# Patient Record
Sex: Female | Born: 1935 | Race: White | Hispanic: No | Smoking: Former smoker
Health system: Southern US, Community
[De-identification: ages and names within clinical notes are randomized; demographics above are authoritative.]

## PROBLEM LIST (undated history)

## (undated) DIAGNOSIS — F29 Unspecified psychosis not due to a substance or known physiological condition: Secondary | ICD-10-CM

## (undated) DIAGNOSIS — R002 Palpitations: Secondary | ICD-10-CM

## (undated) DIAGNOSIS — I341 Nonrheumatic mitral (valve) prolapse: Secondary | ICD-10-CM

## (undated) DIAGNOSIS — G5760 Lesion of plantar nerve, unspecified lower limb: Secondary | ICD-10-CM

## (undated) DIAGNOSIS — K219 Gastro-esophageal reflux disease without esophagitis: Secondary | ICD-10-CM

## (undated) DIAGNOSIS — M47817 Spondylosis without myelopathy or radiculopathy, lumbosacral region: Secondary | ICD-10-CM

## (undated) DIAGNOSIS — E785 Hyperlipidemia, unspecified: Secondary | ICD-10-CM

## (undated) DIAGNOSIS — R0989 Other specified symptoms and signs involving the circulatory and respiratory systems: Secondary | ICD-10-CM

## (undated) DIAGNOSIS — IMO0002 Reserved for concepts with insufficient information to code with codable children: Secondary | ICD-10-CM

## (undated) DIAGNOSIS — F419 Anxiety disorder, unspecified: Secondary | ICD-10-CM

## (undated) DIAGNOSIS — Z8719 Personal history of other diseases of the digestive system: Secondary | ICD-10-CM

## (undated) DIAGNOSIS — I10 Essential (primary) hypertension: Secondary | ICD-10-CM

## (undated) DIAGNOSIS — M4120 Other idiopathic scoliosis, site unspecified: Secondary | ICD-10-CM

## (undated) DIAGNOSIS — D500 Iron deficiency anemia secondary to blood loss (chronic): Secondary | ICD-10-CM

## (undated) DIAGNOSIS — M549 Dorsalgia, unspecified: Secondary | ICD-10-CM

## (undated) HISTORY — DX: Palpitations: R00.2

## (undated) HISTORY — DX: Iron deficiency anemia secondary to blood loss (chronic): D50.0

## (undated) HISTORY — DX: Other idiopathic scoliosis, site unspecified: M41.20

## (undated) HISTORY — DX: Gastro-esophageal reflux disease without esophagitis: K21.9

## (undated) HISTORY — PX: EXCISION MORTON'S NEUROMA: SHX5013

## (undated) HISTORY — DX: Dorsalgia, unspecified: M54.9

## (undated) HISTORY — DX: Hyperlipidemia, unspecified: E78.5

## (undated) HISTORY — DX: Anxiety disorder, unspecified: F41.9

## (undated) HISTORY — DX: Reserved for concepts with insufficient information to code with codable children: IMO0002

## (undated) HISTORY — DX: Lesion of plantar nerve, unspecified lower limb: G57.60

## (undated) HISTORY — DX: Other specified symptoms and signs involving the circulatory and respiratory systems: R09.89

## (undated) HISTORY — PX: LUMBAR FUSION: SHX111

## (undated) HISTORY — DX: Essential (primary) hypertension: I10

## (undated) HISTORY — DX: Nonrheumatic mitral (valve) prolapse: I34.1

## (undated) HISTORY — DX: Unspecified psychosis not due to a substance or known physiological condition: F29

## (undated) HISTORY — DX: Personal history of other diseases of the digestive system: Z87.19

## (undated) HISTORY — DX: Spondylosis without myelopathy or radiculopathy, lumbosacral region: M47.817

## (undated) HISTORY — PX: OTHER SURGICAL HISTORY: SHX169

---

## 1998-03-14 ENCOUNTER — Encounter: Admission: RE | Admit: 1998-03-14 | Discharge: 1998-06-12 | Payer: Self-pay | Admitting: Specialist

## 1998-05-12 ENCOUNTER — Ambulatory Visit (HOSPITAL_COMMUNITY): Admission: RE | Admit: 1998-05-12 | Discharge: 1998-05-12 | Payer: Self-pay | Admitting: Specialist

## 1998-10-04 ENCOUNTER — Encounter: Payer: Self-pay | Admitting: Gastroenterology

## 1998-10-04 ENCOUNTER — Ambulatory Visit (HOSPITAL_COMMUNITY): Admission: RE | Admit: 1998-10-04 | Discharge: 1998-10-04 | Payer: Self-pay | Admitting: Gastroenterology

## 1999-08-30 ENCOUNTER — Ambulatory Visit (HOSPITAL_COMMUNITY): Admission: RE | Admit: 1999-08-30 | Discharge: 1999-08-30 | Payer: Self-pay | Admitting: Internal Medicine

## 1999-08-30 ENCOUNTER — Encounter (INDEPENDENT_AMBULATORY_CARE_PROVIDER_SITE_OTHER): Payer: Self-pay | Admitting: Specialist

## 2000-02-19 ENCOUNTER — Other Ambulatory Visit: Admission: RE | Admit: 2000-02-19 | Discharge: 2000-02-19 | Payer: Self-pay | Admitting: Gynecology

## 2000-11-29 ENCOUNTER — Encounter: Payer: Self-pay | Admitting: Internal Medicine

## 2000-11-29 ENCOUNTER — Ambulatory Visit (HOSPITAL_COMMUNITY): Admission: RE | Admit: 2000-11-29 | Discharge: 2000-11-29 | Payer: Self-pay | Admitting: Internal Medicine

## 2001-09-02 HISTORY — PX: TOTAL KNEE ARTHROPLASTY: SHX125

## 2001-09-12 ENCOUNTER — Encounter: Payer: Self-pay | Admitting: Specialist

## 2001-09-18 ENCOUNTER — Inpatient Hospital Stay (HOSPITAL_COMMUNITY): Admission: RE | Admit: 2001-09-18 | Discharge: 2001-09-22 | Payer: Self-pay | Admitting: Specialist

## 2001-09-18 ENCOUNTER — Encounter: Payer: Self-pay | Admitting: Specialist

## 2001-10-23 ENCOUNTER — Encounter: Admission: RE | Admit: 2001-10-23 | Discharge: 2002-01-21 | Payer: Self-pay | Admitting: Specialist

## 2001-12-30 ENCOUNTER — Inpatient Hospital Stay (HOSPITAL_COMMUNITY): Admission: RE | Admit: 2001-12-30 | Discharge: 2002-01-01 | Payer: Self-pay | Admitting: Specialist

## 2002-01-22 ENCOUNTER — Encounter: Admission: RE | Admit: 2002-01-22 | Discharge: 2002-03-30 | Payer: Self-pay | Admitting: Specialist

## 2002-02-05 ENCOUNTER — Ambulatory Visit (HOSPITAL_COMMUNITY): Admission: RE | Admit: 2002-02-05 | Discharge: 2002-02-05 | Payer: Self-pay | Admitting: Specialist

## 2002-02-05 ENCOUNTER — Encounter: Payer: Self-pay | Admitting: Specialist

## 2002-04-07 ENCOUNTER — Inpatient Hospital Stay (HOSPITAL_COMMUNITY): Admission: RE | Admit: 2002-04-07 | Discharge: 2002-04-10 | Payer: Self-pay | Admitting: Specialist

## 2002-04-13 ENCOUNTER — Encounter: Admission: RE | Admit: 2002-04-13 | Discharge: 2002-06-25 | Payer: Self-pay | Admitting: Specialist

## 2003-01-01 ENCOUNTER — Encounter: Payer: Self-pay | Admitting: Orthopedic Surgery

## 2003-01-01 ENCOUNTER — Encounter: Admission: RE | Admit: 2003-01-01 | Discharge: 2003-01-01 | Payer: Self-pay | Admitting: Orthopedic Surgery

## 2003-02-10 ENCOUNTER — Inpatient Hospital Stay (HOSPITAL_COMMUNITY): Admission: RE | Admit: 2003-02-10 | Discharge: 2003-02-15 | Payer: Self-pay | Admitting: Orthopedic Surgery

## 2003-02-10 ENCOUNTER — Encounter: Payer: Self-pay | Admitting: Orthopedic Surgery

## 2003-05-17 ENCOUNTER — Other Ambulatory Visit: Admission: RE | Admit: 2003-05-17 | Discharge: 2003-05-17 | Payer: Self-pay | Admitting: Gynecology

## 2003-11-19 ENCOUNTER — Ambulatory Visit (HOSPITAL_COMMUNITY): Admission: RE | Admit: 2003-11-19 | Discharge: 2003-11-19 | Payer: Self-pay | Admitting: Neurosurgery

## 2004-01-04 ENCOUNTER — Ambulatory Visit (HOSPITAL_COMMUNITY): Admission: RE | Admit: 2004-01-04 | Discharge: 2004-01-04 | Payer: Self-pay | Admitting: Neurosurgery

## 2004-01-28 ENCOUNTER — Ambulatory Visit (HOSPITAL_COMMUNITY): Admission: RE | Admit: 2004-01-28 | Discharge: 2004-01-29 | Payer: Self-pay | Admitting: Neurosurgery

## 2004-09-08 ENCOUNTER — Encounter: Payer: Self-pay | Admitting: Internal Medicine

## 2004-09-08 ENCOUNTER — Encounter (INDEPENDENT_AMBULATORY_CARE_PROVIDER_SITE_OTHER): Payer: Self-pay | Admitting: *Deleted

## 2004-09-08 ENCOUNTER — Ambulatory Visit (HOSPITAL_COMMUNITY): Admission: RE | Admit: 2004-09-08 | Discharge: 2004-09-08 | Payer: Self-pay | Admitting: Internal Medicine

## 2005-05-15 ENCOUNTER — Ambulatory Visit: Payer: Self-pay | Admitting: Internal Medicine

## 2006-09-09 ENCOUNTER — Ambulatory Visit: Payer: Self-pay | Admitting: Internal Medicine

## 2007-01-03 ENCOUNTER — Encounter: Admission: RE | Admit: 2007-01-03 | Discharge: 2007-01-03 | Payer: Self-pay | Admitting: Orthopedic Surgery

## 2007-10-24 ENCOUNTER — Ambulatory Visit: Payer: Self-pay | Admitting: Internal Medicine

## 2007-11-06 ENCOUNTER — Ambulatory Visit: Payer: Self-pay | Admitting: Internal Medicine

## 2007-11-20 ENCOUNTER — Ambulatory Visit (HOSPITAL_COMMUNITY): Admission: RE | Admit: 2007-11-20 | Discharge: 2007-11-20 | Payer: Self-pay | Admitting: Neurosurgery

## 2008-03-04 ENCOUNTER — Inpatient Hospital Stay (HOSPITAL_COMMUNITY): Admission: RE | Admit: 2008-03-04 | Discharge: 2008-03-11 | Payer: Self-pay | Admitting: Neurosurgery

## 2008-03-31 ENCOUNTER — Emergency Department (HOSPITAL_COMMUNITY): Admission: EM | Admit: 2008-03-31 | Discharge: 2008-03-31 | Payer: Self-pay | Admitting: Emergency Medicine

## 2008-10-20 ENCOUNTER — Ambulatory Visit (HOSPITAL_COMMUNITY): Admission: RE | Admit: 2008-10-20 | Discharge: 2008-10-20 | Payer: Self-pay | Admitting: Orthopedic Surgery

## 2009-08-09 ENCOUNTER — Telehealth: Payer: Self-pay | Admitting: Internal Medicine

## 2010-10-12 ENCOUNTER — Telehealth: Payer: Self-pay | Admitting: Internal Medicine

## 2010-10-16 ENCOUNTER — Encounter: Payer: Self-pay | Admitting: Internal Medicine

## 2010-11-07 ENCOUNTER — Ambulatory Visit: Payer: Self-pay | Admitting: Internal Medicine

## 2010-11-07 ENCOUNTER — Encounter (INDEPENDENT_AMBULATORY_CARE_PROVIDER_SITE_OTHER): Payer: Self-pay

## 2010-11-08 ENCOUNTER — Ambulatory Visit: Payer: Self-pay | Admitting: Cardiology

## 2010-11-16 ENCOUNTER — Telehealth: Payer: Self-pay | Admitting: Internal Medicine

## 2010-11-17 ENCOUNTER — Ambulatory Visit: Payer: Self-pay | Admitting: Internal Medicine

## 2010-11-21 ENCOUNTER — Encounter: Payer: Self-pay | Admitting: Internal Medicine

## 2011-01-02 NOTE — Miscellaneous (Signed)
Summary: Lec previsit  Clinical Lists Changes  Medications: Added new medication of MOVIPREP 100 GM  SOLR (PEG-KCL-NACL-NASULF-NA ASC-C) As per prep instructions. - Signed Rx of MOVIPREP 100 GM  SOLR (PEG-KCL-NACL-NASULF-NA ASC-C) As per prep instructions.;  #1 x 0;  Signed;  Entered by: Ulis Rias RN;  Authorized by: Hart Carwin MD;  Method used: [facility] [phone]*, [address] [place], [place], [place]  [address], Ph: [phone], Fax: [phone] Allergies: Added new allergy or adverse reaction of SEPTRA Observations: Added new observation of NKA: F (11/07/2010 10:20)    Prescriptions: MOVIPREP 100 GM  SOLR (PEG-KCL-NACL-NASULF-NA ASC-C) As per prep instructions.  #1 x 0   Entered by:   Ulis Rias RN   Authorized by:   Hart Carwin MD   Signed by:   Ulis Rias RN on 11/07/2010   Method used:   Electronically to        [facility] [phone]* (retail)       [address]       [place], [place]  [address]       Ph: [phone]       Fax: [phone]   RxID:   [id number]

## 2011-01-02 NOTE — Letter (Signed)
Summary: [facility] Instructions  [facility]  [address] [place], [place] [address]   Phone: [phone]  Fax: [phone]       Mary Strickland    1930/05/01    MRN: [id number]        Procedure Day Dorna Bloom:  Farrell Ours 11/17/10     Arrival Time: 2:30pm     Procedure Time: 3:30pm     Location of Procedure:                    Juliann Pares _  [facility] (4th Floor)                       PREPARATION FOR COLONOSCOPY WITH MOVIPREP   Starting 5 days prior to your procedure  SUNDAY 12/11  do not eat nuts, seeds, popcorn, corn, beans, peas,  salads, or any raw vegetables.  Do not take any fiber supplements (e.g. Metamucil, Citrucel, and Benefiber).  THE DAY BEFORE YOUR PROCEDURE         DATE: THURSDAY  12/15  1.  Drink clear liquids the entire day-NO SOLID FOOD  2.  Do not drink anything colored red or purple.  Avoid juices with pulp.  No orange juice.  3.  Drink at least 64 oz. (8 glasses) of fluid/clear liquids during the day to prevent dehydration and help the prep work efficiently.  CLEAR LIQUIDS INCLUDE: Water Jello Ice Popsicles Tea (sugar ok, no milk/cream) Powdered fruit flavored drinks Coffee (sugar ok, no milk/cream) Gatorade Juice: apple, white grape, white cranberry  Lemonade Clear bullion, consomm, broth Carbonated beverages (any kind) Strained chicken noodle soup Hard Candy                             4.  In the morning, mix first dose of MoviPrep solution:    Empty 1 Pouch A and 1 Pouch B into the disposable container    Add lukewarm drinking water to the top line of the container. Mix to dissolve    Refrigerate (mixed solution should be used within 24 hrs)  5.  Begin drinking the prep at 5:00 p.m. The MoviPrep container is divided by 4 marks.   Every 15 minutes drink the solution down to the next mark (approximately 8 oz) until the full liter is complete.   6.  Follow completed prep with 16 oz of clear liquid of your choice (Nothing red  or purple).  Continue to drink clear liquids until bedtime.  7.  Before going to bed, mix second dose of MoviPrep solution:    Empty 1 Pouch A and 1 Pouch B into the disposable container    Add lukewarm drinking water to the top line of the container. Mix to dissolve    Refrigerate  THE DAY OF YOUR PROCEDURE      DATE: FRIDAY  12/16  Beginning at  10:30 a.m. (5 hours before procedure):         1. Every 15 minutes, drink the solution down to the next mark (approx 8 oz) until the full liter is complete.  2. Follow completed prep with 16 oz. of clear liquid of your choice.    3. You may drink clear liquids until  1:30pm  (2 HOURS BEFORE PROCEDURE).   MEDICATION INSTRUCTIONS  Unless otherwise instructed, you should take regular prescription medications with a small sip of water   as early as possible the  morning of your procedure. .  Additional medication instructions: Do not take HCTZ am of procedure         OTHER INSTRUCTIONS  You will need a responsible adult at least 75 years of age to accompany you and drive you home.   This person must remain in the waiting room during your procedure.  Wear loose fitting clothing that is easily removed.  Leave jewelry and other valuables at home.  However, you may wish to bring a book to read or  an iPod/MP3 player to listen to music as you wait for your procedure to start.  Remove all body piercing jewelry and leave at home.  Total time from sign-in until discharge is approximately 2-3 hours.  You should go home directly after your procedure and rest.  You can resume normal activities the  day after your procedure.  The day of your procedure you should not:   Drive   Make legal decisions   Operate machinery   Drink alcohol   Return to work  You will receive specific instructions about eating, activities and medications before you leave.    The above instructions have been reviewed and explained to me by   Ulis Rias RN  November 07, 2010 11:35 AM     I fully understand and can verbalize these instructions _____________________________ Date _________

## 2011-01-02 NOTE — Progress Notes (Signed)
Summary: ECL?   Phone Note Call from Patient Call back at [facility] Phone [phone]   Caller: Patient Call For: Dr. Juanda Chance Reason for Call: Talk to Nurse Summary of Call: would like to have an ECL... only rec COL Initial call taken by: Vallarie Mare,  October 12, 2010 9:52 AM  Follow-up for Phone Call        Patient states she called last year in Nov. and was told they would send her a letter to get a colonoscopy scheduled in Dec. States she did not get a letter. According to the note dated 08/09/09, the recall should be for 12/11. She is in [employer] for this date also. Patient states this is incorrect. She states she had 2 polyps removed  with her last  colon. She states her stomach is "sore." States she has BM daily with only occassional constipation. She states "When I leaned over to tie my shoe the other day I had sore water come up in my throat." She wants to schedule a colonoscopy and EGD before the new year because of insurance. Last colon was 12/08 and last EGD 09/2004.  Patient aware that it may be next week before I have answers. Please, advise.  Follow-up by: Jesse Fall RN,  October 12, 2010 11:12 AM  Additional Follow-up for Phone Call Additional follow up Details #1::        OK She may be scheduled for EGD and colon before the end of the year Additional Follow-up by: Hart Carwin MD,  October 14, 2010 3:08 PM     Appended Document: ECL? Patient scheduled for colon and EGD on 11/17/10 at 3:30 PM at [facility]. Previsit scheduled for 11/07/10 at 11:00AM. Patient will call back if she cannot find a "driver" for the procedure.

## 2011-01-02 NOTE — Letter (Signed)
Summary: Previsit letter  [facility]  [address] [place], [place] [address]   Phone: [phone]  Fax: [phone]       10/16/2010 MRN: [id number]  Mary Strickland [address] [place], [place]  [address]  Dear [name],  Welcome to the Gastroenterology Division at [facility].    You are scheduled to see a nurse for your pre-procedure visit on 11/07/10 at 11:00 AM on the 3rd floor at [facility], [address]  We ask that you try to arrive at our office 15 minutes prior to your appointment time to allow for check-in.  Your nurse visit will consist of discussing your medical and surgical history, your immediate family medical history, and your medications.    Please bring a complete list of all your medications or, if you prefer, bring the medication bottles and we will list them.  We will need to be aware of both prescribed and over the counter drugs.  We will need to know exact dosage information as well.  If you are on blood thinners (Coumadin, Plavix, Aggrenox, Ticlid, etc.) please call our office today/prior to your appointment, as we need to consult with your physician about holding your medication.   Please be prepared to read and sign documents such as consent forms, a financial agreement, and acknowledgement forms.  If necessary, and with your consent, a friend or relative is welcome to sit-in on the nurse visit with you.  Please bring your insurance card so that we may make a copy of it.  If your insurance requires a referral to see a specialist, please bring your referral form from your primary care physician.  No co-pay is required for this nurse visit.     If you cannot keep your appointment, please call [phone] to cancel or reschedule prior to your appointment date.  This allows [name] the opportunity to schedule an appointment for another patient in need of care.    Thank you for choosing [facility] for your medical needs.  We  appreciate the opportunity to care for you.  Please visit [name] at our website  to learn more about our practice.                     Sincerely.                                                                                                                   The Gastroenterology Division

## 2011-01-04 NOTE — Procedures (Signed)
Summary: Upper Endoscopy  Patient: Mary Strickland Note: All result statuses are Final unless otherwise noted.  Tests: (1) Upper Endoscopy (EGD)   EGD Upper Endoscopy       DONE      [facility]     [address]     [place], [place]  [address]           ENDOSCOPY PROCEDURE REPORT           PATIENT:  Mary Strickland, Mary Strickland  MR#:  [id number]     BIRTHDATE:  [birth year], 80 yrs. old  GENDER:  female           ENDOSCOPIST:  Hedwig Morton. Juanda Chance, MD     Referred by:  Geoffry Paradise, M.D.           PROCEDURE DATE:  11/17/2010     PROCEDURE:  EGD with biopsy, [address], EGD with dilatation over     guidewire     ASA CLASS:  Class II     INDICATIONS:  abdominal pain hx esophageal stricture 2000, 2005,     hiatal hernia           MEDICATIONS:   Versed 7 mg, Fentanyl 75 mcg     TOPICAL ANESTHETIC:  Exactacain Spray           DESCRIPTION OF PROCEDURE:   After the risks benefits and     alternatives of the procedure were thoroughly explained, informed     consent was obtained.  The LB GIF-H180 [id number] endoscope was     introduced through the mouth and advanced to the second portion of     the duodenum, without limitations.  The instrument was slowly     withdrawn as the mucosa was fully examined.     <<PROCEDUREIMAGES>>           A stricture was found (see image2 and image3). nonobstructing     fibrous ring at the entrance into a hiatal hrnia Savary dilation     over a guidewire 16 mm dilator passed over the guidewire with     minimal resistance  A hiatal hernia was found (see image6, image2,     and image1). large 6-7 cm hiatal herni completely reducible 40-46     cm  Otherwise the examination was normal. With standard forceps, a     biopsy was obtained and sent to pathology. s/p duodenal biopsies.     hx of dduodenitis r/o H (see image4 and image5).pylori     Retroflexed views revealed no abnormalities.    The scope was then     withdrawn from the patient and the procedure completed.        COMPLICATIONS:  None           ENDOSCOPIC IMPRESSION:     1) Stricture     2) Hiatal hernia     3) Otherwise normal examination     nonobstructing fibrous ring, s/p passage of Savory dilator     RECOMMENDATIONS:     1) Anti-reflux regimen to be follow     2) Await biopsy results     cont prelosec 20 mg po bid           REPEAT EXAM:  In 0 year(s) for.           ______________________________     Hedwig Morton. Juanda Chance, MD           CC:  n.     eSIGNED:   Hedwig Morton. Brodie at 11/17/2010 04:22 PM           Tamera Stands, [id number]  Note: An exclamation mark (!) indicates a result that was not dispersed into the flowsheet. Document Creation Date: 11/17/2010 4:23 PM _______________________________________________________________________  (1) Order result status: Final Collection or observation date-time: 11/17/2010 16:00 Requested date-time:  Receipt date-time:  Reported date-time:  Referring Physician:   Ordering Physician: Lina Sar [phone]) Specimen Source:  Source: Launa Grill Order Number: [id number] Lab site:

## 2011-01-04 NOTE — Letter (Signed)
Summary: Patient [facility] Biopsy Results  [facility]  [address] [place], [place] [address]   Phone: [phone]  Fax: [phone]        November 21, 2010 MRN: [id number]    Mary Strickland [address] [place], [place]  [address]    Dear Ms. Kusek,  I am pleased to inform you that the biopsies taken during your recent endoscopic examination did not show any evidence of cancer upon pathologic examination.  Additional information/recommendations:  __No further action is needed at this time.  Please follow-up with      your primary care physician for your other healthcare needs.  __ Please call [phone] to schedule a return visit to review      your condition.  _x_ Continue with the treatment plan as outlined on the day of your      exam.  _   Please call us if you are having persistent problems or have questions about your condition that have not been fully answered at this time.  Sincerely,  Hart Carwin MD  This letter has been electronically signed by your physician.  Appended Document: Patient Notice-Endo Biopsy Results letter mailed

## 2011-01-04 NOTE — Miscellaneous (Signed)
Summary: new med- Levsin  Clinical Lists Changes  Medications: Added new medication of LEVSIN 0.125 MG  TABS (HYOSCYAMINE SULFATE) place one under tongue every 6 hours as needed for abd pain - Signed Rx of LEVSIN 0.125 MG  TABS (HYOSCYAMINE SULFATE) place one under tongue every 6 hours as needed for abd pain;  #30 x 0;  Signed;  Entered by: Oda Cogan RN;  Authorized by: Hart Carwin MD;  Method used: [facility] to [facility] [phone]*, [address], [place], [place], [place]  [address], Ph: [phone], Fax: [phone]    Prescriptions: LEVSIN 0.125 MG  TABS (HYOSCYAMINE SULFATE) place one under tongue every 6 hours as needed for abd pain  #30 x 0   Entered by:   Oda Cogan RN   Authorized by:   Hart Carwin MD   Signed by:   Oda Cogan RN on 11/17/2010   Method used:   Electronically to        [facility] [phone]* (retail)       [address]       [place], [place]  [address]       Ph: [phone]       Fax: [phone]   RxID:   [id number]

## 2011-01-04 NOTE — Procedures (Signed)
Summary: Colonoscopy  Patient: Mary Strickland Note: All result statuses are Final unless otherwise noted.  Tests: (1) Colonoscopy (COL)   COL Colonoscopy           DONE (C)     [facility]     [address]     [place], [place]  [address]           COLONOSCOPY PROCEDURE REPORT           PATIENT:  Mary, Strickland  MR#:  [id number]     BIRTHDATE:  1930-10-17, 80 yrs. old  GENDER:  female     ENDOSCOPIST:  Hedwig Morton. Juanda Chance, MD     REF. BY:  Geoffry Paradise, M.D.     PROCEDURE DATE:  11/17/2010     PROCEDURE:  Colonoscopy 19[address]     ASA CLASS:  Class II     INDICATIONS:  Abdominal pain adenomatous polyp 2005     mod severe diverticulosis in 2008     MEDICATIONS:   Versed 3 mg, Fentanyl 50 mcg IV           DESCRIPTION OF PROCEDURE:   After the risks benefits and     alternatives of the procedure were thoroughly explained, informed     consent was obtained.  Digital rectal exam was performed and     revealed no rectal masses.   The LB PCF-H180AL [id number] endoscope     was introduced through the anus and advanced to the cecum, which     was identified by both the appendix and ileocecal valve, without     limitations.  The quality of the prep was excellent, using     MoviPrep.  The instrument was then slowly withdrawn as the colon     was fully examined.     <<PROCEDUREIMAGES>>           FINDINGS:  A sessile polyp was found. 5 mm flat polyp at 50 cm The     polyp was removed using cold biopsy forceps (see image6).     Moderate diverticulosis was found throughout the colon (see image5,     image4, and image1).  Otherwise normal colonoscopy without other     polyps, masses, vascular ectasias, or inflammatory changes (see     image2, image3, and image7).   Retroflexed views in the rectum     revealed no abnormalities.    The scope was then withdrawn from     the patient and the procedure completed.           COMPLICATIONS:  None     ENDOSCOPIC IMPRESSION:     1) Sessile polyp     2)  Moderate diverticulosis throughout the colon     3) Otherwise nl colonoscopy WMO     abd. pain not from diverticulosis, possibly due to an IBS     RECOMMENDATIONS:     1) Await pathology results     Benefiber 1 tsp daily     Levsin SL.125mg  #30, 1SL q6 hrs prn abd. pain     REPEAT EXAM:  In 0 year(s) for.  no recall due to age           ______________________________     Hedwig Morton. Juanda Chance, MD           CC:           n.     REVISED:  11/21/2010 09:13 AM     eSIGNED:   Verlee Monte  M. Brodie at 11/21/2010 09:13 AM           Tamera Stands, [id number]  Note: An exclamation mark (!) indicates a result that was not dispersed into the flowsheet. Document Creation Date: 11/21/2010 9:13 AM _______________________________________________________________________  (1) Order result status: Final Collection or observation date-time: 11/17/2010 16:12 Requested date-time:  Receipt date-time:  Reported date-time:  Referring Physician:   Ordering Physician: Lina Sar [phone]) Specimen Source:  Source: Launa Grill Order Number: [id number] Lab site:

## 2011-01-04 NOTE — Progress Notes (Signed)
Summary: Procedure question   Phone Note Call from Patient Call back at Home Phone [phone]   Caller: Patient Call For: dr. Juanda Chance Reason for Call: Talk to Nurse Summary of Call: Pt has questions about prep from procedure in the morning Initial call taken by: [place] Johnson,  November 16, 2010 4:50 PM  Follow-up for Phone Call        called and pt and talked her through steps of Moviprep.  pt voiced understanding.  gave her 24-7 phone number [phone]) in case of emergency or difficulty tonight. Follow-up by: Doristine Church RN II,  November 16, 2010 5:02 PM

## 2011-01-04 NOTE — Miscellaneous (Signed)
Summary: new med Prilosec  Clinical Lists Changes  Medications: Added new medication of PRILOSEC OTC 20 MG  TBEC (OMEPRAZOLE MAGNESIUM) 1 twice a day 30 minutes before meals - Signed Rx of PRILOSEC OTC 20 MG  TBEC (OMEPRAZOLE MAGNESIUM) 1 twice a day 30 minutes before meals;  #60 x 12;  Signed;  Entered by: Oda Cogan RN;  Authorized by: Hart Carwin MD;  Method used: Electronically to [facility] [phone]*, [address], [place], [place], [place]  [address], Ph: [phone], Fax: [phone]    Prescriptions: PRILOSEC OTC 20 MG  TBEC (OMEPRAZOLE MAGNESIUM) 1 twice a day 30 minutes before meals  #60 x 12   Entered by:   Oda Cogan RN   Authorized by:   Hart Carwin MD   Signed by:   Oda Cogan RN on 11/17/2010   Method used:   Electronically to        [facility] [phone]* (retail)       [address]       [place], [place]  [address]       Ph: [phone]       Fax: [phone]   RxID:   [id number]

## 2011-03-26 ENCOUNTER — Telehealth: Payer: Self-pay | Admitting: Cardiology

## 2011-03-26 NOTE — Telephone Encounter (Signed)
Called requesting samples of Bystolic 5 mg. Advised we had 10 mg and could cut them in 1/2. She will pick those up.

## 2011-03-26 NOTE — Telephone Encounter (Signed)
NEEDS SAMPLES OF BYSTOLIC 5MG . WILL BE OVER THIS WAY ON [address]ED.

## 2011-04-17 NOTE — Op Note (Signed)
[name], [name] NO.:  [id number]   MEDICAL RECORD NO.:  [id number]          PATIENT TYPE:  INP   LOCATION:  3027                         FACILITY:  [facility]   PHYSICIAN:  Danae Orleans. Venetia Maxon, M.D.  DATE OF BIRTH:  09-Feb-1930   DATE OF PROCEDURE:  03/04/2008  DATE OF DISCHARGE:                               OPERATIVE REPORT   PREOPERATIVE DIAGNOSIS:  Herniated lumbar disc with stenosis,  spondylosis, scoliosis, L3-L4, L4-L5, and L5-S1, with degenerative disc  disease and radiculopathy, also depleted spinal cord stimulator battery.   POSTOPERATIVE DIAGNOSIS:  Herniated lumbar disc with stenosis,  spondylosis, scoliosis, L3-L4, L4-L5, and L5-S1, with degenerative disc  disease and radiculopathy, also depleted spinal cord stimulator battery.   PROCEDURE:  1. Laminectomy bilaterally L3-L4, L4-L5, and L5-S1.  2. Posterior lumbar interbody fusion with PEEK interbody cages, bone      morphogenic protein with FORTRUS autograft L3-L4, L4-L5 and L5-S1      bilaterally.  3. Segmental arthrodesis L3 to S1 bilaterally.  4. Posterolateral arthrodesis L3 to S1 bilaterally.  5. Replacement of implantable pulse generator and Synergy spinal cord      stimulator battery.   SURGEON:  Danae Orleans. Venetia Maxon, M.D.   ASSISTANT:  Coletta Memos, M.D.   ANESTHESIA:  General endotracheal anesthesia.   BLOOD LOSS:  300 mL with 100 mL of Cell Saver blood returned to the  patient.   COMPLICATIONS:  None.   DISPOSITION:  Recovery.   INDICATIONS:  Mary Strickland is a 77 year old woman with multilevel  spondylosis, stenosis, and scoliosis at L3-L4, L4-L5 and L5-S1 levels.  She had previously been managed with implantable pulse generator and  spinal cord stimulator which gave her five years of relief, but has  developed progressive spondylosis and bilateral lower extremity pain,  right greater than left.  It was elected to take her to surgery for  decompression and fusion at the affected levels  and also to replace her  depleted implantable pulse generator or spinal cord stimulator at the  same time.   DESCRIPTION OF PROCEDURE:  Mary Strickland was brought to the operating room.  Following a satisfactory and uncomplicated induction of general  endotracheal anesthesia plus intravenous lines, Foley catheter, and  arterial line, she was placed in a prone position on the [place] table.  Soft tissue and bony prominences were padded appropriately.  Her low  back was prepped and draped using Betadine scrub and paint including  over her right gluteal region where her implantable pulse generator had  been placed.  Subsequently, the area in her back was infiltrated with  0.25% Marcaine and 0.5% lidocaine with 1:200,000 epinephrine.  An  incision was made overlying her implantable pulse generator and this was  replaced into the new Synergy battery.  This wound was then closed with  2-0 Vicryl sutures and 3-0 Vicryl reapproximated the skin edges.  Subsequently, this incision was dressed with Benzoin, Steri-Strips, and  an occlusive dressing at the end of the case.   Subsequently, the facet joint complexes and transverse processes of L3  through sacral alae were  then exposed.  A self-retaining retractor was  placed to facilitate exposure.  After intraoperative x-ray confirmed  correct orientation with marker probes at the L3, L4, and L5 transverse  processes and the sacral alae, the bilateral laminotomies of L3, L4 and  L5 were performed under loupe magnification. The thecal sac and L3, L4,  L5 and S1 nerve roots were decompressed bilaterally.  Initially, on the  right side, the discectomies were performed.  There was a significant  amount of collapse at the L5-S1 level and this was opened up using disc  space distractor.  Subsequently, discectomies were then performed on the  left side of midline.  The discectomy was then more thoroughly completed  and then trial spacers were placed and a more  thorough discectomy was  then completed on the right side of midline.  An 8 mm PEEK interbody  cage was selected, packed with a small piece of BMP and FORTRUS  reconstituted with autogenous blood and inserted in the interspace at  the L5-S1 level.  Subsequently, 9 mm cages similarly packed were placed  at the L4-L5 and L3-L4 levels.  Additional FORTRUS was placed along with  a piece of BMP soaked sponge deep in the interspace at each of these  levels.  Additional similarly sized cages were placed on the left side  of the midline and additional FORTRUS and autogenous bone was placed  overlying the cages after their position was confirmed on lateral  fluoroscopy. Pedicle screw fixation was then placed using 6.5 x 40 mm  sacral screws, 5.5 x 45 mm L5 screws, similarly sized L4 screws, and 5.5  x 50 mm screws at L3.  Positioning of the screws was confirmed on AP and  lateral fluoroscopy.  The wound was irrigated and then posterolateral  arthrodesis was performed over decorticated transverse processes and  posterior facet joint complexes on the left with the remaining BMP bone  autograft and FORTRUS and on the right with remaining BMP and Actifuse.  90 mm rods were placed and locked down in situ.  The self-retaining  retractor was removed.  The lumbodorsal fascia was closed with #1 Vicryl  suture, the subcutaneous tissues were reapproximated with 2-0 Vicryl  interrupted inverted sutures, and skin edges were reapproximated with  interrupted 2-0 Vicryl subcuticular stitch.  The wound was dressed with  Benzoin, Steri-Strips, Telfa gauze and tape.  The patient was extubated  in the operating room and taken to the recovery room in stable  satisfactory condition having tolerated the operation well.  Counts were  correct at the end of the case.      Danae Orleans. Venetia Maxon, M.D.  Electronically Signed     JDS/MEDQ  D:  03/04/2008  T:  03/04/2008  Job:  [id number]

## 2011-04-17 NOTE — Discharge Summary (Signed)
NAMEARIAL, GALLIGAN NO.:  [id number]   MEDICAL RECORD NO.:  [id number]          PATIENT TYPE:  INP   LOCATION:  3027                         FACILITY:  [facility]   PHYSICIAN:  Danae Orleans. Venetia Maxon, M.D.  DATE OF BIRTH:  [birth year]   DATE OF ADMISSION:  03/04/2008  DATE OF DISCHARGE:  03/11/2008                               DISCHARGE SUMMARY   REASON FOR ADMISSION:  Idiopathic scoliosis, lumbar disk displacement,  and lumbosacral spondylosis.   ADDITIONAL DIAGNOSES:  Psychosis not otherwise specified, unspecified  constipation, tobacco use disorder, esophageal reflux, and long-term use  of aspirin.   HISTORY OF ILLNESS AND HOSPITAL COURSE:  Mary Strickland is a 77 year old  woman who has previously had placement of spinal cord stimulator for  chronic back and bilateral lower extremity pain.  She was found to have  significant lumbar spondylosis with scoliosis and severe radicular pain.  She was admitted to the hospital on the same-day procedure basis for  decompressive laminectomy and interbody fusions at L3-L4, L4-L5, and L5-  S1 levels with posterolateral arthrodesis, pedicle screw fixation, and  replacement of implantable pulse generator.  Postoperatively, she did  well.  She was having some initial confusion, which gradually improved.  She has mobilized with physical therapy and was doing well on March 11, 2008, and was discharged home in stable and satisfactory condition,  having tolerated operation and hospitalization well.   DISCHARGE INSTRUCTIONS:  To follow up in the office in 3 weeks for  recheck.      Danae Orleans. Venetia Maxon, M.D.  Electronically Signed     JDS/MEDQ  D:  03/30/2008  T:  03/30/2008  Job:  [id number]

## 2011-04-20 NOTE — Op Note (Signed)
. [facility]  Patient:    Mary Strickland, Mary Strickland Visit Number: [id number] MRN: [id number]          Service Type: SUR Location: [id number] Attending Physician:  Lubertha South Dictated by:   Kerrin Champagne, M.D. Proc. Date: 04/07/02 Admit Date:  04/07/2002                             Operative Report  PREOPERATIVE DIAGNOSIS:  Arthrofibrosis, right total knee arthroplasty, with diminished right knee motion.  POSTOPERATIVE DIAGNOSIS:  Arthrofibrosis, right total knee arthroplasty, with diminished right knee motion.  PROCEDURE:  Arthroscopic debridement, right total knee arthroplasty, with closed manipulation right total knee replacement.  SURGEON:  Kerrin Champagne, M.D.  ASSISTANTJill Side P. Mahar, P.A.  ANESTHESIA:  Epidural anesthesia, Burna Forts, M.D.  TOURNIQUET TIME:  Total tourniquet time at 300 mmHg 58 minutes.  ESTIMATED BLOOD LOSS:  2 cc.  DRAINS:  None.  BRIEF CLINICAL HISTORY:  This patient is a 77 year old female who underwent a total knee arthroplasty on the right side in October of last year.  She has since developed arthrofibrosis, underwent closed manipulation in January with apparent improvement of range of motion, only to have range of motion diminish over the ensuing four to six weeks postop.  Range of motion is approximately 0-90 degrees.  Todays evaluation preoperatively indicates a range of motion of 0 to only 60 degrees without anesthesia.  While under anesthetic, the patient, however, developed increased range of motion with the ability to fully extend the knee and flex the knee to almost 90 degrees under epidural anesthesia.  INTRAOPERATIVE FINDINGS:  The patient with an epidural catheter in place demonstrated improved range of motion from 0-90 degrees whereas preoperatively showed only 0-60 degrees, suggesting a component of volitional diminished range of motion.  Range of motion, however, did  significantly improve following arthroscopic debridement of the joint and manipulation.  The range of motion was 0-130 degrees possible.  This following removal of the patients tourniquet and after closure of wounds.  DESCRIPTION OF PROCEDURE:  After adequate epidural anesthesia, the patient i supine position, the right lower extremity prepped from the ankle to the upper thigh with Duraprep solution and a tourniquet about the right upper thigh.  A right knee holder was used.  The left leg well-padded with Ace wrap about the left leg to prevent DVT.  Following standard prep, draped in the usual manner.  The right knee had insertion of the inflow cannula in line with previous medial parapatellar incision scar approximately 2 cm above the superior pole of the patella, a stab wound made with an 11 blade scalpel, and then the inflow cannula, a small cannula, was inserted into the suprapatellar pouch.  The knee inflated with irrigant solution and stab incision made in the anterior inferior lateral portal region for insertion of the scope.  An additional incision was then made in line with the medial parapatellar incision just inferior to the inferior pole of the patella to allow for entry in the joint and use of the 4.2 Cuda side shaver.  The scope inserted, then the suprapatellar pouch examined, as was the medial recess.  Medial recess was found to have scar extending over the medial aspect of the tibial insert.  This was debrided using the 4.2 Cuda shaver.  Scar tissue was also found to be present within the intercondylar notch, adhesing the ligamentum  mucosa type effect from the patellar tendon into the patients notch, and this was debrided using the shaver as well.  The lateral anterior aspect of the joint was then debrided as well, as was the lateral recess. There was some evidence of a lateral shelf plica, which was debrided, and then the suprapatellar pouch was debrided of scar tissue  attached to the anterior aspect of the distal femur in hopes of regaining further motion here. Following this debridement, then, irrigant solution was removed.  The knee was then placed through a range of motion of 0-120 degrees initially.  The inflow cannula portal was closed with 4-0 Vicryl suture subcuticular, as was the inferomedial and inferolateral portals closed in similar fashion, then a vertical mattress suture of 4-0 nylon applied to each of the incisions. Dressings:  Adaptic, 4 x 4s, ABD pad affixed to the skin with sterile Webril. The patient then had removal of the drapes.  The tourniquet was released and removed.  With the tourniquet off, then the knee was able to be flexed to nearly 130 degrees.  Extension was full and complete.  With this, then, an Ace wrap was applied.  The patient was then returned to the recovery room in satisfactory condition.  All instrument and sponge counts were correct.  Photomicrographs were obtained demonstrating arthrofibrotic scar within the medial recess, some evidence also suggests scar tissue within the intercondylar notch seen on the VCR error photomicrograph. Dictated by:   Kerrin Champagne, M.D. Attending Physician:  Lubertha South DD:  04/07/02 TD:  04/08/02 Job: [id number]

## 2011-04-20 NOTE — H&P (Signed)
[place]  Patient:    Mary Strickland, Mary Strickland Visit Number: [id number] MRN: [id number]          Service Type: SUR Location: 4W [id number] [id number] Attending Physician:  Lubertha South Dictated by:   Ottie Glazier Wynona Neat, P.A.-C. Admit Date:  09/18/2001 Discharge Date: 09/22/2001                           History and Physical  DATE OF HISTORY AND PHYSICAL:  December 26, 2001.  CHIEF COMPLAINT:  Right knee pain.  HISTORY OF PRESENT ILLNESS:  Ms. Howells is a 77 year old white female who is status post a right total knee arthroplasty on September 18, 2001.  The patient had an initially benign postoperative course in which she underwent physical therapy; however, she persisted to have worsening range of motion and loss of approximately 14 degrees of flexion.  The patient also noticed increasing discomfort in her knee, primarily over the anterior aspect of the knee.  The patient was seen and evaluated by Dr. Otelia Sergeant and was found to have arthrofibrosis.  Due to these findings, the patient has opted to proceed with manipulation of the right knee under general anesthesia.  This has been discussed in great detail with the patient per Dr. Vira Browns.  ALLERGIES:  SEPTRA DS, TYLENOL NO. 3.  MEDICATIONS:  Lorazepam 1-1/2 p.o. q.h.s., Prilosec one p.o. q.h.s., calcium supplements two a day, vitamin E one a day, Robaxin 500 mg one p.o. q.8h. p.r.n., Darvocet-N one to two tablets every four to eight hours p.r.n. pain.  PAST MEDICAL HISTORY:  She has a history of mitral valve prolapse with a history of "palpitations," also a history of anxiety and GERD.  PAST SURGICAL HISTORY:  Significant for hysterectomy, Mortons neuroma removal, right total knee arthroplasty in 2002 as stated above.  SOCIAL HISTORY:  She is married with one child.  She denies any tobacco or alcohol use.  She lives in a single-level home.  Her primary care physician is Dr. Jacky Kindle.  FAMILY HISTORY:  Her  mother and father both had a history of coronary artery disease, and her father subsequently had a CVA.  She does have a brother with lung cancer.  REVIEW OF SYSTEMS:  The patient states that she occasionally has chills and night sweats; however, this is due to "menopause."  Denies any weight loss or decrease in appetite.  HEENT:  She does wear corrective lenses.  Denies any history of chronic headache, seeing spots or sticks, ringing of the ears, or persistent runny nose or sore throat.  There are no thyroid problems.  CHEST: She denies any chronic cough, productive cough, hemoptysis.  CARDIOVASCULAR: History of mitral valve prolapse and a history of palpitations, questionably related to anxiety attacks.  GASTROINTESTINAL/GENITOURINARY:  Denies any history of chronic diarrhea, constipation, melena, bright red stools per rectum, dysuria, frequency, urgency, or nocturia.  EXTREMITIES:  Please see HPI.  NEUROLOGIC:  Denies any history of seizure or stroke.  PHYSICAL EXAMINATION:  VITAL SIGNS:  Blood pressure is 140/80, pulse of 90, respirations 16.  GENERAL:  This is a very pleasant 77 year old white female appearing her stated age, very talkative.  HEENT:  Head is atraumatic, normocephalic.  NECK:  Supple without masses or carotid bruits.  CHEST:  Clear to auscultation bilaterally.  BREASTS:  Deferred, not pertinent to present illness.  CARDIAC:  Regular rate and rhythm, S1, S2.  ABDOMEN:  Soft, nontender, bowel sounds are  positive.  There is no guarding, no rebound.  GENITOURINARY:  Deferred, not pertinent to present illness.  EXTREMITIES:  Physical examination shows that the right knee is approximately 10 degrees short of full extension with active extension.  Passively the patient can nearly fully extend the right knee.  Passive range of motion is generally approximately 90-95 degrees.  DIAGNOSTIC STUDIES:  Radiographs reveal a right knee replacement with good position  and alignment.  No varus or valgus angle to the tibial component or femoral component.  Sunrise patellar view demonstrates articulation of the patellofemoral joint in good position and alignment.  No lateral augment of the patella noted.  The patients lateral radiograph demonstrate some slight notching of the distal anterior and femoral cortex associated with relative hypoplastic right lateral femoral condyle, resulting in notching with the coronal _____.  IMPRESSION: 1. Right knee arthrofibrosis. 2. History of mitral valve prolapse. 3. History of palpitations. 4. History of anxiety. 5. History of gastroesophageal reflux disease.  PLAN:  The patient will be admitted to [facility] to undergo manipulation of the right knee under general anesthesia, at which time she will have an intraoperative injection of Depo-Medrol.  The risks and benefits of this procedure have been discussed with the patient, of which she states good understanding prior to entering the operating suite.  Considering her history of mitral valve prolapse and a history of palpitations, irregardless of the etiology the patient at this point will need prophylactic antibiotics.  The patient has also been instructed to have medical clearance prior to the procedure. Dictated by:   Ottie Glazier. Wynona Neat, P.A.-C. Attending Physician:  Lubertha South DD:  12/28/01 TD:  12/29/01 Job: [id number]

## 2011-04-20 NOTE — Discharge Summary (Signed)
Mary Strickland                            ACCOUNT NO.:  [id number]   MEDICAL RECORD NO.:  [id number]                   PATIENT TYPE:  INP   LOCATION:  0476                                 FACILITY:  [facility]   PHYSICIAN:  Ollen Gross, M.D.                 DATE OF BIRTH:  [birth year]   DATE OF ADMISSION:  02/10/2003  DATE OF DISCHARGE:  02/15/2003                                 DISCHARGE SUMMARY   ADMISSION DIAGNOSES:  1. Painful right total knee.  2. Mitral valve prolapse.  3. History of palpitations.  4. Anxiety.  5. Gastroesophageal reflux disease.   DISCHARGE DIAGNOSES:  1. Painful right total knee, status post revision right total knee     replacement arthroplasty.  2. Mild postoperative blood loss anemia.  3. Mild postoperative hyponatremia, improved.  4. Mitral valve prolapse.  5. History of palpitations.  6. Anxiety.  7. Gastroesophageal reflux disease.   PROCEDURE:  The patient was taken to the OR on 02/10/03, underwent a  revision of a right total knee arthroplasty.  Surgeon:  Ollen Gross, MD.  Assistant:  Alexzandrew L. Perkins, P.A.-C.  Surgery under general  anesthesia.  Minimal blood loss.  Hemovac drain x 1.  Tourniquet time 62  minutes at 300 mmHg and then down for 10 minutes and up an additional 23  minutes at 300 mmHg.   CONSULTS:  Rehabilitation Services, Ellwood Dense, M.D.   BRIEF HISTORY:  The patient is a 77 year old female, who was referred over  to Ollen Gross, M.D. for a second opinion concerning a painful right total  knee.  It was done approximately one year ago and which has been complicated  by pain and stiffness.  She has undergone closed manipulation and also had  arthroscopic lysis of adhesions.  All of these failed.  She was subsequently  seen by Ollen Gross, M.D. for a second opinion and management.  It was  noted that she had significant notching on the anterior femoral cortex.  Bone scan showed increased activity also in  that area.  She had painful  motion.  It was felt that she possibly had a loosening of the prosthesis.  It was also noted in the past that she had had a fall postoperative  following her total knee and felt this may have aggravated her underlying  condition.  She was brought into the hospital for surgery.   LABORATORY DATA:  CBC on admission:  Hemoglobin 14.1, hematocrit 41.0, white  cell count 7.6, red cell count 4.58.  Postop H&H 10.8 and 31.2.  Last noted  H&H 10.3 and 30.3.  Differential on admission:  CBC all within normal  limits.  PT/PTT on admission were 12.5 and 33, respectively with an INR of  0.9.  Serial pro times followed per Coumadin protocol.  Last noted PT/INR  was 20.2 and 1.9.  Chem panel on admission all  within normal limits.  Follow-  up BMET showed a drop in sodium from 141 to 134, back up to 137.  Glucose  went up from 81 to 178 and was back down to 130.  Calcium dropped from 9.8  to 8.3, was back up to 8.4.  Urinalysis on admission only showed some small  leukocyte esterase and only rare epithelial cells and 0-2 white cells, 0-2  red cells with few hyaline cast.  Blood group type A positive.   Smears taken at the time of surgery:  Initial Gram stain, tissue from the  right knee showed few WBC's, no organisms seen.  Second synovial Gram stain  from the right knee showed rare WBC's and no organisms seen.  Tissue  cultures taken on 02/10/03 from the right knee:  No WBC's, no organisms seen  on the Gram stain.  No growth in the culture.  Wound culture taken from the  synovial tissue of the right knee showed Gram stain:  No WBC's, no organisms  seen, no growth on the culture.  Anaerobic culture taken from the knee on  its Gram stain:  No WBC's, no organisms seen, and no anaerobes were isolated  during the hospital course.   X-RAYS:  X-rays of the right knee on 02/10/03, showed good position and  alignment following right total knee revision.  The chest x-ray report  from  [facility] dated 09/01/02 showed slight  cardiomegaly, no active disease.   EKG dated 02/03/03:  Normal sinus rhythm.  Nonspecific ST and T-wave  abnormalities.  No significant change since last tracing, confirmed by  Nanetta Batty, M.D.  There is also a previous EKG on the chart dated  09/12/01, which shows normal sinus rhythm, normal EKG, no old tracings to  compare, confirmed by Peter M. [place], M.D.   HOSPITAL COURSE:  The patient was admitted to [facility], taken to  the OR, underwent the above-stated procedure without complications.  The  patient tolerated the procedure well and was later transferred to the  recovery room and then to the orthopedic floor for continued postop care.  The patient was placed on PCA analgesics for pain control following surgery.  Started back on her home medications.  Was given 48 hours of postop IV  antibiotics, placed weightbearing as tolerated.  PT and OT were consulted to  assist with gait training ambulation and ADLs following surgery.  Serial pro  times were followed per Coumadin protocol, may be found in the laboratory  data section of this chart.  Hemovac drain, which was placed at the time of  surgery, was pulled on postop day one.  Dressing change was initiated on  postop day two.  The incision was healing well.  Staples were intact.  No  signs of infection.  She did undergo physical therapy during the hospital  course.  It was noted by postop day two, the patient was already ambulating  approximately 60 feet and continued to improve.  She was utilizing the CPM  for range of motion.  It was encouraged for her to do range of motion in the  CPM and also active assisted range of motion with the therapist and also  active motion by herself to obtain maximum results from the revision  surgery.  Initially, it was felt the patient may need a short course on rehab; therefore, rehab consult was called early  in the hospital course.  She was seen by rehab and felt she  may be a possible candidate for a short  stay on rehab.  It was noted during the second half of the course that the  patient was progressing well, on top of which after talking with rehab,  there were no rehab or SACU beds available for several days.  Due to this  information, arrangements were made for the patient to go home on Monday.  Home health PTx and home health nursing was arranged.  By Monday, 02/15/03,  the patient was doing quite well.  She was making progress with physical  therapy.  She was up ambulating more independent.  She had previously  arranged equipment at home.  All we had to do was arrange for a CPM.  She  was doing well, seen on rounds, and was discharged home.   DISCHARGE MEDICATIONS AND PLAN:  1. The patient discharged home on 02/15/03.  2. Discharge diagnoses, please see above.August 28, 2002.   DISCHARGE MEDICATIONS:  1. Vicodin 5 mg p.r.n. pain.  2. Coumadin as per pharmacy protocol.  3. Robaxin 500 mg p.r.n. spasm.   DIET:  Resume previous home diet.   ACTIVITY:  1. She is weightbearing as tolerated to the right lower extremity.  2. Genevieve Norlander for home care, home health PT, home health nursing.  3. She will arrange to have home health PT every day at home during the     first week after discharge and then the second week, she will have it     Monday, Wednesday, Friday.  4. She will be up as tolerated.   FOLLOW UP:  She is going to follow up in the office next week.  She is to  call the office for an appointment at [phone].   DISPOSITION:  Home.   CONDITION ON DISCHARGE:  Improved.     Alexzandrew L. Julien Girt, P.A.              Ollen Gross, M.D.    ALP/MEDQ  D:  02/15/2003  T:  02/15/2003  Job:  [id number]

## 2011-04-20 NOTE — Op Note (Signed)
[name], [name]                            ACCOUNT NO.:  [id number]   MEDICAL RECORD NO.:  [id number]                   PATIENT TYPE:  OIB   LOCATION:  3038                                 FACILITY:  [facility]   PHYSICIAN:  Danae Orleans. Venetia Maxon, M.D.               DATE OF BIRTH:  Oct 12, 1930   DATE OF PROCEDURE:  01/28/2004  DATE OF DISCHARGE:                                 OPERATIVE REPORT   PREOPERATIVE DIAGNOSES:  Chronic intractable back pain with sciatica status  post successful spinal cord stimulator trial.   POSTOPERATIVE DIAGNOSES:  Chronic intractable back pain with sciatica status  post successful spinal cord stimulator trial.   PROCEDURE:  Placement of permanent spinal cord stimulator by thoracic  laminectomy with implantable pulse generator.   SURGEON:  Danae Orleans. Venetia Maxon, M.D.   ANESTHESIA:  General endotracheal anesthesia.   ESTIMATED BLOOD LOSS:  Minimal.   COMPLICATIONS:  None.   DISPOSITION:  To recovery.   INDICATIONS FOR PROCEDURE:  Mary Strickland is a 77 year old woman with chronic  intractable low back and bilateral lower extremity pain with lumbar  degenerative spondylosis without surgically correctable lesion. It was  elected for her to undergo a percutaneous spinal cord stimulator trial which  she did and which gave her considerable relief of her pain.  It was  therefore elected to take her to surgery for a surgically implanted spinal  cord stimulator.   DESCRIPTION OF PROCEDURE:  [name] was brought to the operating room.  Following the satisfactory and uncomplicated induction of general  endotracheal anesthesia and placement of intravenous lines, the patient was  positioned in the prone position on the operating table and chest rolls.  Her back preoperative fluoroscopic image was used to localize the T11 level  and then her back was prepped and draped in the usual sterile fashion. The  areas of plane incision were infiltrated with 0.25% Marcaine and 0.5%  lidocaine 1:200,000 epinephrine.  An incision was made in the midline of her  thoracic spine at the level of T11 and subperiosteal dissection was  performed on the right side. The T11 lamina was identified on intraoperative  fluoroscopy and hemisemilaminectomy of T11 was then performed with exposure  of the spinal cord dura. The paddle __________ contact [employer] spinal cord  stimulator electrode was then placed in the epidural space centered on T9-10  in the midline. The fascia was then closed with #0 Vicryl sutures and  anchors were placed with 2-0 silk stitches.  A subcutaneous pocket was  created over the right buttock and the tunneling device was used to pass the  lead extensions.  The leads were then connected in the usual fashion and  redundant lead was circularized in the wound and the wounds were closed with  2-0 Vicryl sutures and I  reapproximated the subcutaneous tissues and 3-0 Vicryl interrupted stitches  reapproximating the skin edges. The wounds  were dressed with Benzoin and  Steri-Strips, Telfa gauze and tape. A final x-ray was taken prior to closing  the wound which demonstrated that the electrodes had not moved and were in  good position.                                               Danae Orleans. Venetia Maxon, M.D.    JDS/MEDQ  D:  01/28/2004  T:  01/28/2004  Job:  [id number]

## 2011-04-20 NOTE — H&P (Signed)
[facility]  Patient:    Mary Strickland, Mary Strickland Visit Number: [id number] MRN: [id number]          Service Type: Attending:  Kerrin Champagne, M.D. Dictated by:   Irena Cords, P.A.-C Adm. Date:  09/18/01   CC:         Richard A. Jacky Kindle, M.D.   History and Physical  DATE OF BIRTH:  October 14, 1930  SOCIAL:  [id number]  CHIEF COMPLAINT:  Right knee pain.  HISTORY OF PRESENT ILLNESS:  Mary Strickland is a 77 year old female who presents with worsening right knee pain.  This has been persistent, worsening over the last year or two.  She underwent a knee arthroscopy without much relief.  She has also tried multiple cortisone injections as well as Hyalgan series of injections without improvement.  Pain is now daily and interfering with her activities of daily living.  Because of the persistence of her pain, worsening symptoms and the findings on her clinical and radiographic exams, she has elected to undergo surgical intervention.  REVIEW OF SYSTEMS:  She denies any recent fever or chills.  No diplopia, headache or blurred vision.  No rhinorrhea, sore throat, or earache.  No chest pain, shortness of breath or cough.  Does report a stomach ache last week that has since resolved.  Otherwise, no nausea, vomiting, diarrhea or constipation. No melena or bright red blood per rectum.  No urinary frequency, dysuria or hematuria.  No numbness or tingling in her extremities.  PAST MEDICAL HISTORY:  Past medical history is significant for peptic ulcer disease, gastroesophageal reflux disease, thyroid disorder, mitral valve prolapse and occasional heart palpitations.  Denies coronary artery disease, heart attack, stroke, seizure, cancer, diabetes, hypertension or lung or kidney disease.  ALLERGIES:  SEPTRA/SULFA causing throat swelling and PENICILLIN caused her "feel bad."  CURRENT MEDICATIONS: 1. Lorazepam 1.5 mg q.h.s. 2. Prilosec 20 mg q.d. 3. Premarin 0.625 mg  q.d. 4. Vitamin E 1000 IU q.d. 5. Calcium 600 mg b.i.d. 6. Bextra, which she will stop a week before surgery.  FAMILY HISTORY:  Family history is significant for a stroke in her sister, lung cancer in her brother and heart disease in another brother.  She is unsure of her parents medical history.  PAST SURGICAL HISTORY:  Mortons neuroma, right knee arthroscopy, cataract surgery, hysterectomy and lymph node excision on the left arm.  SOCIAL HISTORY:  She is married.  She has one daughter who will be coming down to assist with her after surgery.  Patient denies remote history of tobacco use.  She has one step leading into her home and lives in a one-floor dwelling.  She is retired.  Dr. Jesse Sans. Wall is her cardiologist. Dr. Pearletha Furl. Jacky Kindle is her medical physician.  PHYSICAL EXAMINATION:  VITAL SIGNS:  Pulse 76, respiratory rate 10, blood pressure 120/72.  GENERAL:  A well-developed, well-nourished female in no acute distress.  HEENT:  Head is atraumatic, normocephalic.  Pupils are equal, round and reactive to light and extraocular movements are grossly intact.  Oropharynx is clear without redness, exudate or lesions.  NECK:  Supple with no cervical lymphadenopathy and no carotid bruits.  No thyromegaly.  She does have a small soft tissue mass, suprasternal region, which has been unchanged over years.  CHEST:  Clear to auscultation bilaterally with no wheezes or crackles.  HEART:  Regular rate and rhythm with no murmurs, rubs, or gallops.  ABDOMEN:  Soft, nontender, nondistended, with no masses, no hepatosplenomegaly.  BREASTS:  Not examined as not pertinent to present illness.  GENITOURINARY:  Not examined as not pertinent to present illness.  SKIN:  Intact.  EXTREMITIES:  DP pulse 2+.  Motor and sensory are grossly intact.  Right knee demonstrates a varus deformity.  Tender medial joint line as well as the lateral patellofemoral joint.  Crepitus throughout range of  motion, range of motion is 0 to 125 degrees.  X-RAY FINDINGS:  X-rays demonstrate significant joint line sclerosis and narrowing of the medial joint line on the right knee.  IMPRESSION: 1. Right knee osteoarthritis. 2. Peptic ulcer disease. 3. Gastroesophageal reflux. 4. Thyroid disorder. 5. Mitral valve prolapse. 6. Occasional heart palpitations.  PLAN:  Patient will be admitted to [facility] to undergo a right cemented total knee arthroplasty by Dr. Kerrin Champagne on September 18, 2001. Preoperative labs and signed surgical consent will be obtained.  Patient has donated two units of autologous blood.  All questions have been encouraged and answered. Dictated by:   Irena Cords, P.A.-C Attending:  Kerrin Champagne, M.D. DD:  09/03/01 TD:  09/03/01 Job: [id number]

## 2011-04-20 NOTE — Op Note (Signed)
[facility]  Patient:    ELLENI, MOZINGO Visit Number: [id number] MRN: [id number]          Service Type: SUR Location: [facility] 2550 13 Attending Physician:  Lubertha South Dictated by:   Kerrin Champagne, M.D. Proc. Date: 12/30/01 Admit Date:  12/30/2001                             Operative Report  PREOPERATIVE DIAGNOSIS: Arthrofibrosis of right total knee arthroplasty.  POSTOPERATIVE DIAGNOSIS: Arthrofibrosis of right total knee arthroplasty.  OPERATION/PROCEDURE: Manipulation of right total knee arthroplasty with intra-articular steroid injection and epidural analgesia.  SURGEON: Kerrin Champagne, M.D.  ANESTHESIA: Epidural supplemented with spinal anesthesia, Dr. Jacklynn Bue.  ESTIMATED BLOOD LOSS: Two cc.  DRAINS: None.  INDICATIONS FOR PROCEDURE: The patient is a 77 year old female, who has undergone previous total knee arthroplasty almost four months ago.  She initially had excellent range of motion and over the last two months has had worsening flexion of the right lower extremity, increasing pain and discomfort.  The patient clinically has range of motion of 0-75 degrees.  This is much more limited than at immediate postoperative and following her treatment in physical therapy.  The patient is brought to the operating room to undergo closed manipulation of the right total knee arthroplasty and injection of steroid medication, and implementation of epidural analgesia for immediate postoperative CVM and physical therapy.  DESCRIPTION OF PROCEDURE: After adequate epidural anesthesia and spinal anesthetic the patient was turned to the supine position and the right knee placed into flexion at the hip, and then while holding the proximal portion of the tibia the knee was gently flexed.  Palpating the superior aspect of the patella in the region of the quadriceps tendon and the suprapatellar pouch it was noted that the patient had distinct  giving way sensation in this area consistent with tearing of scar tissue as the knee acquired more flexion. Able to flex the knee from 75 degrees down to 120 degrees following manipulation.  The knee could be brought into full extension with pressure over the anterior aspect of the knee.  The patient then had injection of the knee through an anterolateral puncture.  This was done after sterile prep with DuraPrep solution.  Using sterile gloves a solution of 1 cc of Depo Medrol 20 mg to a cc, 15 cc xylocaine 1% with epinephrine and 15 cc of Marcaine 0.50% plain were injected or instilled into the right knee through injection sterilely.  The knee then again was put throughout range of motion.  A small dressing applied where the injection was performed in the right lateral knee.  The patient then returned to the recovery room in satisfactory condition.  All instrument and sponge counts were correct. Dictated by:   Kerrin Champagne, M.D. Attending Physician:  Lubertha South DD:  12/30/01 TD:  12/31/01 Job: [id number]

## 2011-04-20 NOTE — Assessment & Plan Note (Signed)
[facility]                           GASTROENTEROLOGY OFFICE NOTE   ADORE, KITHCART                         MRN:          [id number]  DATE:09/09/2006                            DOB:          [birth year]    Ms. Friscia is a very nice 78 year old white female who has history of  symptomatic diverticulosis.  She also has history of severe gastroesophageal  reflux and esophageal stricture, which has been dilated multiple times  starting in 1981 by Dr. Victorino Dike.  She has had numerous colonoscopies,  the last one in October 2005, confirming presence of marked diverticulosis  of the left colon.  She had also tubular adenomas and is scheduled for  repeat colonoscopy in October 2008.  Three weeks ago, the patient developed  a rather marked left upper quadrant abdominal pain around the splenic  flexure radiating slightly medially.  It has now subsided about 80% and  residual discomfort is now in the right lower quadrant.  Her bowel habits  remained normal throughout this episode.  There was no fever, no vomiting.  She does take oxycodone regularly for control of lower back pain.  She also  takes Prilosec for gastroesophageal reflux.   MEDICATIONS:  1. Prilosec 20 mg p.o. b.i.d.  2. Potassium supplements.  3. Zocor 20 mg p.o. daily.  4. Temazepam 1 nightly.  5. Aspirin 325 mg p.o. daily.  6. Calcium supplements.   PHYSICAL EXAM:  Blood pressure 126/64, pulse 78, weight 148 pounds.  She was alert,oriented, in no distress.  LUNGS:  Clear to auscultation.  COR:  Normal S1 and normal S2.  NECK:  Supple.  No adenopathy.  ABDOMEN:  Soft with normoactive bowel sounds.  Minimal tenderness left upper  quadrant.  Liver edge was auscultatable at the costal margin.  No CVA  tenderness bilaterally.  Lower abdomen was normal.  RECTAL:  Normal rectal tone.  The stool was brown, Hemoccult negative.   IMPRESSION:  70. A 77 year old white female who has a history of  gastroesophageal      reflux, peptic ulcer disease, and stricture, which currently is well-      controlled on proton pump inhibitors.  2. New-onset abdominal pain, most likely related to symptomatic      diverticulosis.  The patient had 2 courses of antibiotics.  On CT scan,      no evidence of inflammation was noted, so the patient has not continued      on antibiotics after she finished the first course.   PLAN:  1. Since the pain has resolved about 75%, no other medical treatment will      be necessary.  2. She is due for colonoscopy October of 2008.  3. Continue Prilosec 20 mg b.i.d.  4. The patient will notify us if the pain starts again.  She may need      another course of antibiotics and a repeat CT scan.       Hedwig Morton. Juanda Chance, MD      DMB/MedQ  DD:  09/09/2006  DT:  09/10/2006  Job #:  [id number]   cc:   Geoffry Paradise, M.D.

## 2011-04-20 NOTE — Discharge Summary (Signed)
[place]. [facility]  Patient:    Mary Strickland, Mary Strickland Visit Number: [id number] MRN: [id number]          Service Type: SUR Location: [id number] Attending Physician:  Lubertha South Dictated by:   Sammuel Cooper Mahar, P.A. Admit Date:  04/07/2002 Discharge Date: 04/10/2002                             Discharge Summary  DATE OF BIRTH:  [birth year].  DISPOSITION:  Patient discharged home.  ADMISSION DIAGNOSES: 1. Status post right total knee with arthrofibrosis, status post closed    manipulation in January of 2003 of right total knee. 2. History of mitral valve prolapse with history of palpitations. 3. Anxiety. 4. Gastroesophageal reflux disease.  DISCHARGE DIAGNOSES: 1. Status post arthroscopic debridement of right total knee replacement    with manipulation of right total knee replacement. 2. History of mitral valve prolapse with history of palpitations. 3. Anxiety. 4. Gastroesophageal reflux disease.  PROCEDURE:  Right knee arthroscopic debridement of total knee replacement with manipulation of right total knee replacement.  SURGEON:  Kerrin Champagne, M.D.  ASSISTANT:  Jill Side Mahar, PA-C.  ANESTHESIA:  Epidural.  CONSULTATION:  Rehabilitation consult, Dr. Thomasena Edis.  BRIEF HISTORY OF PRESENT ILLNESS:  The patient is a 77 year old white female status post right total knee replacement in October, 2002. Following that the patient had limited motion.  She had a closed manipulation done in January of 2003, however, she was having continued problems with range of motion despite the closed manipulation and progressive physical therapy.  It has gotten to the point that it is near constant aggravation as well as interfering with her activities of daily living.  It was felt the best course of management for her at this point would be arthroscopic debridement and possible open debridement of her right total knee replacement. The risks and benefits of  the surgery were discussed with the patient by Dr. Vira Browns.  The patient indicated understanding and opted to proceed.  LABORATORY DATA:  Preadmission laboratory on Apr 06, 2002 with CBC within normal limits.  Protime and INR, PTT within normal limits.  Comprehensive metabolic panel within normal limits. Urinalysis was negative.  On Apr 08, 2002 the hemoglobin and hematocrit were within normal limits.  Basic metabolic panel within normal limits with the exception of a glucose of 147.  X-rays from September 12, 2001 are on the chart and show right knee osteoarthritis medial compartment.  X-rays of chest show no active disease, again from September 12, 2001. No other x-ray reports are on the chart.  No electrocardiogram report is on the chart.  HOSPITAL COURSE:  Following admission on Apr 07, 2002 the patient was taken to the operating room for the above-listed procedure.  She tolerated the procedure well without any intraoperative complications.  Surgical time was 58 minutes with estimated blood loss being minimal.  The patient was transferred to the recovery room in stable condition.  The patient was continued on intravenous antibiotics for 24 hours postoperatively and completed this course without any difficulty.  Her diet was advanced as tolerated also without any difficulty.  Neurovascular checks were started immediately postoperatively and were intact without any problems.  These were continued daily throughout her hospital stay and remained intact.  The patient was started on a CPM machine immediately postoperatively in PACU with maximum tolerated range there from 0 to 110 degrees maximum  range of motion allowed with the machine for 8 to 10 hours per day.  She tolerated this without any significant difficulties. Physical therapy and occupational therapy were also consulted to begin aggressive range of motion beginning on the operative day.  She was continued on bed rest for two days  secondary to epidural in place and as we did not want to risk any falls or complications.  Pain control was attained utilizing the epidural.  Upon this being discontinued on postoperative day #2 she was transitioned over to p.o. analgesics for pain and pain control remained adequate throughout her stay.  Operative dressing was taken down on postoperative day #2.  Incision looked good without any signs or symptoms of infections.  Portals were without any drainage and seen to be healing well. Sutures were intact.  Daily dressing changes were done thereafter and continued to look good without any signs or symptoms of infection. Rehabilitation consultation was ordered with Dr. Thomasena Edis at the patients request.  It was felt that she would benefit from this if she was an appropriate candidate, however, it was expected that she would do too well to be appropriate for rehabilitation therapy.  The rehabilitation doctors did discuss this with her and she was adamant that she wanted to go to rehabilitation if there was a possibility that she would be a candidate, therefore this was totally explored and it was determined that she was doing too well with therapy and did not need [facility] inpatient level of therapies.  Therefore other options including outpatient physical therapy were explored.  We did get an appointment arranged for her upon discharge to begin outpatient physical therapy shortly following discharge.  By Apr 10, 2002 the patient was doing very well.  She was ready for discharge home.  Outpatient physical therapy had been arranged.  She did get physical therapy in the hospital prior to being discharge.  Her vital signs were stable.  She was afebrile and was neurovascularly intact.  The portals looked very good without any drainage.  PLAN:  Status post right knee arthroscopic with lysis of adhesions.  ACTIVITY:  Weight-bearing as tolerated.  Aggressive range of motion.  She  is taking the CPM machine for home use, maximum range tolerated for 8 to 10 hours  per day.  She has a home range of motion physical therapy program that has been explained to her and she does indicate understanding and demonstrated understanding with therapist.  FOLLOW UP:  Patient will follow up with physical therapy on Monday following discharge at 3:00 oclock at [facility] location.  She will follow up with Dr. Vira Browns at two weeks postoperatively.  She will call for that appointment time.  She will also call should any problems or questions arise.  MEDICATIONS:  Patient indicates she has all the pain medications she needs at home and she will resume them as prescribed.  CONDITION ON DISCHARGE:  Stable and improved.  DISPOSITION:  Patient is being discharged to her home with outpatient physical therapy and her family to assist her with care. Dictated by:   Sammuel Cooper. Mahar, P.A. Attending Physician:  Lubertha South DD:  05/05/02 TD:  05/07/02 Job: [phone]

## 2011-04-20 NOTE — Op Note (Signed)
Mary Strickland, Mary Strickland                            ACCOUNT NO.:  [id number]   MEDICAL RECORD NO.:  [id number]                   PATIENT TYPE:  INP   LOCATION:  0476                                 FACILITY:  [facility]   PHYSICIAN:  Ollen Gross, M.D.                 DATE OF BIRTH:  [birth year]   DATE OF PROCEDURE:  02/10/2003  DATE OF DISCHARGE:                                 OPERATIVE REPORT   PREOPERATIVE DIAGNOSIS:  Failed right total knee arthroplasty.   POSTOPERATIVE DIAGNOSIS:  Failed right total knee arthroplasty.   PROCEDURE:  Right total knee arthroplasty revision.   SURGEON:  Gus Rankin. Aluisio, M.D.   ASSISTANT:  Alexzandrew L. Julien Girt, P.A.   ANESTHESIA:  General.   ESTIMATED BLOOD LOSS:  Minimal.   DRAIN:  Hemovac x 1.   TOURNIQUET TIME:  62 minutes at 300 and down 10 minutes and up an additional  23 at 300.   COMPLICATIONS:  None.   CONDITION:  Stable to recovery.   BRIEF CLINICAL NOTE:  Mary Strickland is a 77 year old female, who had a total knee  arthroplasty done approximately three years ago, complicated by pain and  stiffness.  She had closed manipulation and also had an arthroscopic lysis  of adhesions.  All of these failed.  She was subsequently sent to me for  management.  It was noted that she had significant notching of her anterior  femoral cortex, and a bone scan showed increased activity in that area.  She  presents now for total knee revision secondary to pain and limited motion.   PROCEDURE IN DETAIL:  After the successful administration of general  anesthetic, a tourniquet is placed high on the right thigh, right lower  extremity prepped and draped in the usual sterile fashion.  Extremity is  wrapped in Esmarch, knee flexed, and tourniquet inflated to 300 mmHg.  Her  preop flexion is about 85-90 degrees.  Once the tourniquet is up, we did a  standard incision to the knee.  The skin is cut with a 10 blade through the  subcutaneous tissue to the level of  the extensor mechanism.  A fresh blade  is used to make a medial parapatellar arthrotomy.  Thickened scars are  removed.  The fluid and capsule are removed from the joint and sent for STAT  Gram stains, C&S which is negative.  Once all the scar is excised, then we  were able to evert the patella and flex the knee to 90 degrees.  PCL was  then removed.  She had a cruciate-retaining knee.  We then removed the  tibial polyethylene.  The femoral component is inspected, and the interface  between the cement and bone is then disrupted, utilizing osteotomes, and  then the femur is removed with minimal if any bone loss.  Tibia was easily  removed, and it appears there might have been  some loosening there to begin  with.  Once all the components were out, then we could bulk the tibial  cement.  We opened up the intramedullary canals and reamed the femoral canal  up to 16 mm for a 16 x 125 stem and then tibial side reamed to 15 mm to put  a 13 mm cemented stem.   We placed the intramedullary tibial cutting guide on the tibial side and  then freshened up the tibial cut.  Cut bone is removed.  Size 2.5 is going  to be the most appropriate tibial component.  We need an offset tray which  would put the stem lateral and the tray medial in order to get full  bone  coverage.  We then put the 13 x 16 stem extension onto the trial tibia and  at lengthening lay flush on the bone.  We then removed that trial and  addressed the femur.   The 60 mm femoral bony stem is placed into the canal, and then the distal  femoral cut is made off of that with 5 degrees of right valgus.  We just  freshened up the cuts distally.  We then put the sizing block in at the 2.5.  With the 10 mm spacer block in place and the knee in 90 degrees of flexion,  we marked the rotation for the AP cut.  We put this in a plus 2 position to  effectively raise the stem and lower the components so it would not be  elevated off the anterior  cortex.  We then pinned the block, and there was  no defect anteriorly, and then we took the posterior cut.  The intercondylar  and chamfer block is then placed in the same position, and the intercondylar  and chamfer cuts made.  The trial femur is then placed with a 16 x 125 trial  stem, size 2.5 posterior stabilized femur with no augments.  We placed that  and the tibial trial and then had a 10 mm posterior stabilized insert which  had full extension and excellent balance.  We then removed the patellar  component with an oscillating saw and the thickness of the composite was 25.  We then cut the patella down to approximately 12 mm thickness and placed the  template for a 35 patella, drilled the lug holes, and placed the trial  patella which tracked perfectly normally.  The tourniquet is then released  for a total time of 63 minutes.  There was no significant bleeding.  All of  the minor bleeding was stopped with cautery.  I then assembled the  components on the back table.  Once again a 35 patella, the size 2.5  posterior stabilized femur with the bolt in the plus 2 position and the stem  being 16 x 125.  On this tibial side, we had size 2.5 tibial tray with the  stem lateral and affectively shifting the tray medial with a 13 x 60 stem  extension.  After 10 minutes, we reinflated the tourniquet, put the size 5  cement restrictor at the appropriate depth in the tibial canal and then used  pulsatile lavage to cleanse all the bone surfaces.  Cement is mixed and then  injected into the tibial canal and pressurized.  The tibial component is  cemented and all extruded cement removed.  Femoral component is cemented as  is the patella.  The patella is held with the clamp.  Trial 10 mm insert is  placed.  There was some laxity with the 10, so we went up to a permanent  12.5 mm posterior stabilized implant.  This had excellent varus and valgus throughout.  Once the cement is fully hardened, then we  copiously irrigated  the wound with antibiotic solution and closed the extensor mechanism over a  Hemovac drain with interrupted #1 PDS.  Flexion against gravity is 125  degrees.  Subcu is closed with interrupted 2-0 Vicryl and the skin with  staples.  Incision is cleaned and dried and a bulky sterile dressing  applied.  She is then awakened and transported to recovery in stable  condition.                                               Ollen Gross, M.D.    FA/MEDQ  D:  02/10/2003  T:  02/10/2003  Job:  [id number]

## 2011-04-20 NOTE — H&P (Signed)
[place]. [facility]  Patient:    Mary Strickland, Mary Strickland Visit Number: [id number] MRN: [id number]          Service Type: SUR Location: [id number] Attending Physician:  Lubertha South Dictated by:   Sammuel Cooper Mahar, P.A. Admit Date:  04/07/2002                           History and Physical  DATE OF BIRTH:  [birth year]  CHIEF COMPLAINT:  Left knee pain and decreased range of motion.  HISTORY OF PRESENT ILLNESS:  The patient is a 77 year old white female, status post right total knee in October of 2002.  Following that, she had limited motion, had a closed manipulation done in January of 2003, however, she is having continued problems with her range of motion despite the closed reduction and aggressive physical therapy.  It has gotten to the point that it is near constant aggravation as well as interfering with activities of daily living.  It was felt that her best course of management at this point would be an arthroscopic debridement with a possible open debridement of her right total knee.  Risks and benefits of surgery were discussed with the patient by Dr. Kerrin Champagne; she indicated understanding and wished to proceed.  ALLERGIES:  SEPTRA DS and TYLENOL NO. 3.  MEDICATIONS:  Lorazepam, dosage unknown, the patient will bring to hospital for clarification, Prilosec p.o. q.h.s., calcium supplement, vitamin E supplement and Premarin.  PAST MEDICAL HISTORY: 1. History of mitral valve prolapse with a history of "palpitations." 2. Also has a history of anxiety. 3. GERD.  PAST SURGICAL HISTORY: 1. Hysterectomy. 2. Mortons neuromas bilaterally. 3. Right total knee arthroplasty in 2002. 4. Closed reduction in January of 2003.  SOCIAL HISTORY:  The patient is married with one grown child.  She denies any tobacco or alcohol use.  She lives in a single-level home.  Her primary care physician is Dr. Pearletha Furl. Jacky Kindle.   FAMILY HISTORY:  Mother  and father both have a history of coronary artery disease, father with a stroke, brother with lung cancer.  REVIEW OF SYSTEMS:  The patient denies any fevers or bleeding tendencies. Positive for chills and sweats occasionally secondary to menopause.  CNS: Denies any blurred vision, double vision, seizures, headache or paralysis. CARDIOVASCULAR:  Denies chest pain, angina, orthopnea, claudication, palpitations.  PULMONARY:  Denies any shortness of breath, productive cough or hemoptysis.  GI:  Denies nausea, vomiting, constipation, diarrhea, melena or bloody stool.  GU:  Denies any dysuria, hematuria or discharge, urgency. EXTREMITIES:  Denies any paresthesias or numbness.  PHYSICAL EXAMINATION:  VITAL SIGNS:  Blood pressure 140/82.  Respirations 16 and unlabored.  Pulse is 80 and regular.  GENERAL APPEARANCE:  The patient is a 77 year old white female who is alert and oriented in no acute distress, well-nourished, well-groomed, appears stated age, pleasant and cooperative to exam.  HEENT:  Head is normocephalic, atraumatic.  Pupils are equal, round and reactive to light.  Extraocular movements intact.  Nares patent.  Pharynx is clear.  NECK:  Soft to palpation.  No lymphadenopathy or thyromegaly or bruits appreciated.  CHEST:  Clear to auscultation bilaterally.  No rales, rhonchi, stridor, wheezes or friction rubs.  BREASTS:  Not pertinent and not performed.  HEART:  S1 and S2.  Regular rate and rhythm.  No murmurs appreciated.  No gallops or rubs.  ABDOMEN:  Soft to palpation.  Positive bowel sounds.  Nontender, nondistended. No organomegaly noted.  GU:  Not pertinent and not performed.  EXTREMITIES:  Decreased range of motion in the right knee, approximately 0 to 90 degrees, and station and motor function are intact.  Positive posterior tibialis and dorsalis pedis pulses.  SKIN:  Intact without any lesions or rashes.  IMPRESSION:  Right knee arthrofibrosis of her right  total knee.  PLAN: 1. Admit to [facility] on Apr 07, 2002 for a right knee arthroscopic    debridement and possible open debridement to be done by Dr. Vira Browns.    The patients primary care physician is Dr. Jacky Kindle.  She will be    contacting him to make him aware of her surgery and for medical clearance. 2. Postoperatively, the patient will need physical therapy immediately    following surgery; she will also need a CPM machine immediately. 3. The patient would like to go to [facility] if she is an    appropriate candidate. Dictated by:   Sammuel Cooper. Mahar, P.A. Attending Physician:  Lubertha South DD:  04/02/02 TD:  04/03/02 Job: [id number]

## 2011-04-20 NOTE — Op Note (Signed)
[facility]  Patient:    Mary Strickland, Mary Strickland Visit Number: [id number] MRN: [id number]          Service Type: SUR Location: 4W 0481 01 Attending Physician:  Lubertha South Proc. Date: 09/18/01 Admit Date:  09/18/2001                             Operative Report  PREOPERATIVE DIAGNOSIS:  Right knee medial joint osteoarthritis.  POSTOPERATIVE DIAGNOSIS:  Right knee medial joint osteoarthritis.  PROCEDURE:  Right total knee arthroplasty using Osteonics Scorpio cruciate retaining implants #5 femoral and #5 tibial component with a 10 mm spacer and 26 mm patella component cemented in place.  SURGEON:  Kerrin Champagne, M.D.  ASSISTANT:  Illene Labrador. [place], M.D.  ANESTHESIA:  GOT, Dr. Almeta Monas.  ESTIMATED BLOOD LOSS:  75 cc.  DRAINS:  Foley to straight drain.  TOTAL TOURNIQUET TIME:  1 hour and 15 minutes.  BRIEF CLINICAL HISTORY:  The patient is a 77 year old female, who presents with severe worsening of knee pain over the last two years.  She has undergone multiple steroid injections, hyalgan injections without improvement.  The pain is daily, interfering with daily activities, and she has night pain as well. She has attempted treatment with anti-inflammatory agents and currently taking Bextra without relief of symptoms.  She is brought to the operating room to undergo right total knee arthroplasty.  INTRAOPERATIVE FINDINGS:  Primarily medial joint osteoarthritis changes involving the medial femoral condyle with severe bone cartilage loss and bone on cartilage medially.  No torn cartilage was noted.  The anterior medial femoral condyle had the appearance of an avascular necrosis with loose flap of cartilage anteriorly.  Patellofemoral joint and lateral joint line, however, had a very good appearance.  DESCRIPTION OF PROCEDURE:  After adequate general anesthesia, right lower extremity prepped from the upper thigh to the ankle, tourniquet about  the upper thigh, bump under the right buttock, prepped with DuraPrep solution, draped in the usual manner, iodine-impregnated Vi-Drape.  The incision, standard anteromedial approach to the right knee incision through the skin and subcutaneous layers approximately 12 cm in length, carried along the medial border of the patella and medial aspect of the patella tendon along the anteromedial aspect of the tibia proximally.  The retinaculum of the knee externally preserved as well as the knee retinaculum itself and both incised and a cuff of tissue preserved along the medial aspect of the patella for reattachment.  Incision carried sharply into skin and into the knee through the synovial layer.  The patella then everted, and the proximal portion of the tibia then exposed using electrocautery and subperiosteal dissection exposed both medial and lateral, excising the medial and lateral menisci as best as possible anteriorly and elevating the medial collateral ligament and pes anserinus medially, as this was the area of primary osteoarthritis changes and joint space narrowing.  Following this, then the knee was flexed.  The anterior cruciate ligament excised using electrocautery.  Knee further flexed, and soft tissue was protected, and Leksell rongeur used to remove small osteophytes within the intercondylar notch.  A step-cut reamer then used to perform opening into the distal portion of the femoral intermedullary canal through the intercondylar notch.  This was then widened using further step-cut reamer and canal finder inserted.  A 5 mm degree distal cutting jig was inserted, and this was used to place the cutting block distally and a 10 mm thickness.  This was then pinned to the distal femur anteriorly.  The intermedullary guide then removed and the cross pin placed. Distal transverse femoral cut was then performed using oscillating saw and protecting soft tissue structures appropriately.   The end of the femur then measured using the feeler guide over the end of the femur.  It measured to a #5.  This was then used to place holes for placement of distal cutting jig.  The distal cutting jig was then placed, thought that the guide was placed in such a way as to allow for some external rotation of the prosthesis about 10 degrees but certainly not to allow for any internal rotation of the prosthesis.  With the distal femoral cutting jig then impacted into place, cutting was performed in the anterior coronal plane.  There was some slight notching of the distal femoral cortex, 1-2 mm.  The posterior cuts were then performed in the coronal plane over the medial and lateral condyles posteriorly.  Following this, then chamfer cuts made posteriorly and then anteriorly.  With this then the cutting guide was removed.  And then the cutting guide placed for the placement of the intercondylar groove.  This was impacted into place and then held in place with towel clips and cross pins. This was then cut and then the cutting guide removed.  The femur was then retracted and the posterior aspect of the coronal cuts over the posterior condyles was examined.  There was some small amount of bone material remaining, and the curved 0.75 inch osteotome was used to shave this off of the posterior aspect of the condyles bilaterally.  With this then, traction then placed on menisci medial and lateral, and these were excised in total, care taken to cauterize the areas where the geniculate arteries were felt to be in place.  The tibia then was easily subluxed anteriorly using first a narrow Crego and then a blunt McCale.  The median eminence was then excised using oscillating saw and then the #5 plate used to place the drill hole in the proximal portion of the tibia into the intermedullary canal using step-cut reamer, and this was widened further with a further step-cut reamer.  Canal finder then  inserted, and then the narrow cutting rod placed and intermedullary rod.  The proximal cut was made 4 mm depth from the medial tibial plateau surface.  It was aligned using the  alignment guide longitudinally, bisecting the ankle joint distally.  The patient had planovalgus foot which tended to place the guide just medial to the foot.  However, it did bisect the ankle joint.  With then in place, then the proximal tibial cutting guide was fastened to the proximal tibia using pins and a cross pin.  And then the intermedullary guide was removed.  The proximal tibial cut performed using oscillating saw.  Edges debrided, care taken to preserve the posterior cruciate attachment.  With this, then the plate with a 10 mm wafer was placed, trial, and the distal femoral component placed.  The leg then placed into full extension, and it showed excellent range of motion without tendency to lift-off with extension and flexion of the knee.  The knee was stable to varus and valgus stress.  There was no remaining motion to allow for a trial of the larger prosthesis, and a smaller prosthesis would have been too small at 8 mm.  The cut for the patella was then performed, attaching the patella guide and medializing it.  Cutting for  a 26 mm patella component, but this was the smallest component available for this patient.  The depth of cut was 10 mm.  Three drill holes then placed for the posterior aspect of the patella component anterior to the inset reamer.  And then the excess bone and cartilage removed and the trial 26 mm implant placed.  The femoral component then removed, and the tibial component was then removed and the tibia then resubluxed after marking the anterior placement of the tibial plate for rotation purposes.  The proximal guide was then used for cutting for the wings for the tibial component prosthesis.  This was done after first pinning the proximal plate to the tibia and then placing the  tower and then impacting first with the uncemented 3.5 and then the cement 3.5 impacted proximally. With this, then irrigation was performed, removing all bony debris, soft tissue debrided from the knee joint.  Careful inspection demonstrated no active bleeding areas.  Cement was then mixed with permanent prostheses on the field, both #5 for the femur and #5 for the tibia.  Following irrigation with copious amounts of postirrigant solution and the knee then resubluxed, suction used to dry the proximal portion of the tibia when the cement had reached and met a semi-putty consistency, then placed over the proximal tibia within the proximal tibia channel, and the proximal tibia component flanges were then packed into place.  There was some slight difficulty with flexion and extension of the prosthesis, but it seated quite nicely.  It was impacted into place, excess cement removed from around the proximal tibial plate.  Next the trial #10 wafer was placed over the proximal tibia and the femur then cemented into place by cementing the distal portion of the femur with a semi-putty consistency.  A small amount of cement on the posterior runners of the permanent prosthesis, and this was impacted into place and excess cement removed.  The knee then brought into full extension, and the patella component cemented into place.  Small marks had been made along the cartilage area in line with the seating holes for the patella component.  Cement was then applied and the knee prosthesis applied and compressed and then held in place using the patella seating device.  Excess cement was removed.  Cement was then allowed to harden completely, and then examination of the patella demonstrated that it was fully seated without any excess cement present.  The femoral area had some slight debridement of cement from the intercondylar notch in both medial and lateral.  The posterior condyles demonstrated no  excess cement present.  Tibial component also demonstrated a good appearance.  The knee was then subluxed and the trial component removed and a #10 mm tibial spacer was then applied.  This was impacted into place, care taken to ensure no soft tissue interposed between the plate and the spacer.  With this, then the knee was then reduced and placed into full extension and full flexion.  Flexion over 130 degrees possible and full extension possible with slight recurvatum.  The knee stable in varus and valgus stress.  The knee was then irrigated.  Tourniquet had been released prior to the placement of the permanent spacer and all small bleeders controlled peripherally superficially.  There was no active bleeding present and very little bleeding present following the release of tourniquet and application of the permanent tibial spacer.  With this in mind, a drain was not felt to be necessary.  The synovial lining of  the knee was closed with interrupted #1 Vicryl sutures.  The external retinaculum of the knee closed with interrupted #1 Vicryl sutures.  The more superficial retinaculum of the knee approximated with interrupted 2-0 Vicryl sutures and a subcutaneous layer also approximated with interrupted 2-0 Vicryl suture and the skin closed with a running subcu stitch of 3-0 Monocryl.  Tincture of Benzoin and Steri-Strips applied and Adaptic 4x4s ABD pads fixed to the skin with Kerlix then ACE wraps applied for the toes, the upper thigh and knee immobilizer.  The patient was then reactivated, returned to her bed and the recovery room in satisfactory condition.  All instrument and sponge counts were correct. Attending Physician:  Lubertha South DD:  09/18/01 TD:  09/19/01 Job: [id number]

## 2011-04-20 NOTE — Discharge Summary (Signed)
[facility]  Patient:    RAYGEN, LINQUIST Visit Number: [id number] MRN: [id number]          Service Type: SUR Location: 4W 0481 01 Attending Physician:  Lubertha South Dictated by:   Sammuel Cooper Mahar, P.A.-C. Admit Date:  09/18/2001 Discharge Date: 09/22/2001                             Discharge Summary  DATE OF BIRTH:  11-Jul-1930  ADMITTING DIAGNOSES: 1. Right knee osteoarthritis. 2. Peptic ulcer disease. 3. Gastroesophageal reflux disease. 4. Thyroid disorder. 5. Mitral valve prolapse. 6. Occasional heart palpitations.  DISCHARGE DIAGNOSES: 1. Status post right total knee arthroplasty. 2. Postoperative hemorrhagic anemia requiring a transfusion of autologous    blood. 3. Peptic ulcer disease. 4. Gastroesophageal reflux disease. 5. Thyroid disorder. 6. Mitral valve prolapse. 7. Occasional heart palpitations.  PROCEDURE:  Right total knee arthroplasty.  This was done by Dr. Vira Browns with the assistance of Dr. Marlowe Kays.  Anesthesia used was general.  CONSULTS:  [facility], Dr. Thomasena Edis.  HISTORY OF PRESENT ILLNESS:  Patient is a 77 year old female who presented with worsening right knee pain.  Pain is persistent and been getting increasingly worse over the last year to two years.  She underwent a knee arthroscopy without any significant relief.  She has also tried NSAID therapy and multiple cortisone injections as well as a ______ series into this knee without any significant relief or improvement.  Pain got to the point where it was daily and interfering with her activities of daily living, also nighttime pain and it was felt that her best course of action would be to undergo a total knee arthroplasty.  LABORATORY DATA:  On September 11, 2001, CBC showed RBCs of 3.33, hemoglobin 10.4, and hematocrit of 30.2, otherwise within normal limits.  PT, INR, and PTT were within normal limits.  Comprehensive metabolic  panel was within normal limits with the exception of ALP of 36 and total bilirubin of 0.1.  UA was negative with the exception of a trace leukocyte esterase and a few epithelials.  Blood type was A, Rh type positive, antibody screen negative. Hemoglobin and hematocrit were followed in the hospital postoperatively, did reach a low of 8.4 and 24.9 respectively on September 19, 2001.  On September 20, 2001, it was 8.8 and 25.9 respectively.  She was symptomatic and did get a transfusion of autologous blood with her hemoglobin and hematocrit responding well increasing up to 10.3 and 30.1 respectively on September 21, 2001.  PT and INR were also monitored postoperatively while on Coumadin gradually increasing up to the therapeutic range becoming therapeutic on September 21, 2001, at 22.9 and 2.6 respectively.  Basic metabolic panel was done on September 20, 2001, and was within normal limits with the exception of a slightly decreased sodium at 133, slightly decreased potassium 3.1, and decreased calcium at 8.1.  EKG from September 12, 2001, revealed normal sinus rhythm.  Additional comments:  No old tracing to compare this with as confirmed by Dr. Peter Sw[place]iland.  X-rays on September 12, 2001:  Preoperative right total knee revealed osteoarthritis particularly involving the medial compartment.  Chest x-ray on the same date showed heart size is normal, thoracic aorta is mildly tortuous and atherosclerotic.  Hilar and mediastinal contours are otherwise unremarkable. The lungs appear clear.  Degenerative changes are noted in the thoracic spine. There is no evidence of acute  disease.  HOSPITAL COURSE:  The patient was taken to the operating room on September 18, 2001, for the above-listed procedure.  Patient did very well intraoperatively, did not have any complications, and was transferred to the recovery room in stable condition.  Postoperatively, the patient was placed on the appropriate antibiotic course and  tolerated this without any difficulty.  She was initially placed on PCA morphine for pain control.  She was transitioned over to p.o. analgesics and pain control remained adequate.  Her diet was advanced as tolerated.  She was given a bedside incentive spirometer to use q. hour while awake.  Neurovascular checks were done throughout her hospital stay and this remained intact without any problems throughout her stay.  Intraoperative dressing was taken down on postoperative day #2.  This revealed a good looking incision without any signs or symptoms of infection.  Physical therapy and occupational therapy were consulted to assist with the total knee protocol. Patient has partial weightbearing 50%.  Patient did well with therapy increasing up to eventually ambulating 100 feet with just supervision.  CPM machine was used while in the hospital and she tolerated that very well.  On September 20, 2001, patients hemoglobin was 8.8 and she was symptomatic feeling weak with some mild dizziness when she got up, so she was transfused her autologous blood.  At that time, her hemoglobin responded very well the next day being 10.3 and she was no longer symptomatic with the knee.  Pharmacy did manage the patients Coumadin.  She was placed on this postoperatively and managed this.  Throughout her hospital stay, her PT and INR did become therapeutic on September 21, 2001.  Discharge planning was consulted to assist with setting up the patients home needs as well as arranging [facility] for the patient upon discharge.  On September 22, 2001, the patient was doing very well, had met all orthopedic goals, and she was medically stable and ready for discharge to her home status post right total knee arthroplasty.  DISCHARGE MEDICATIONS: 1. Coumadin which is dosed per pharmacy and will be managed as an outpatient    through [facility]. 2. Robaxin 500 mg one tablet p.o. q.6-8h.  p.r.n. spasm, #40, with no refills. 3. Vicodin 5 mg one to two tablets p.o. q.4-6h. p.r.n. pain, #40, no refills.   ACTIVITY:  50% partial weightbearing to the right lower extremity, total knee protocol which the [facility] physical therapist will be working on her with.  WOUND CARE:  Dressing changes should be done daily.  Supplies and instructions were given for this.  Patient may shower on postoperative day #5 assuming there is no drainage from the incision.  FOLLOW-UP:  Patient will follow up two weeks postoperatively with Dr. Vira Browns and she was instructed to call for an appointment for this.  DIET:  She is to resume her preoperative diet as tolerated.  DISPOSITION:  She is being discharged to her home with [facility] for her physical therapy and nursing needs and [facility] to set up her equipment that will be needed at home.  CONDITION ON DISCHARGE:  Stable and improved. Dictated by:   Sammuel Cooper. Mahar, P.A.-C. Attending Physician:  Lubertha South DD:  10/01/01 TD:  10/01/01 Job: [id number]

## 2011-04-20 NOTE — H&P (Signed)
Mary Strickland, Mary Strickland                            ACCOUNT NO.:  [id number]   MEDICAL RECORD NO.:  [id number]                   PATIENT TYPE:  INP   LOCATION:  0476                                 FACILITY:  [facility]   PHYSICIAN:  Ollen Gross, M.D.                 DATE OF BIRTH:  1930-07-28   DATE OF ADMISSION:  02/10/2003  DATE OF DISCHARGE:  02/15/2003                                HISTORY & PHYSICAL   CHIEF COMPLAINT:  Right knee pain.   HISTORY OF PRESENT ILLNESS:  The patient is a 77 year old female who has  been seen in consultation by Ollen Gross, M.D. for ongoing right knee  pain.  She had previously had a right total knee replacement arthroplasty  back in October 2002, primary _________ with [facility] system.  She states  she never got consistent or full pain relief following her surgery.  She did  not have any prolonged wound drainage, fevers, chills, or systemic symptoms.  She did develop significant stiffness in January 2003 and underwent a closed  manipulation.  She had repetitive stiffness after that and underwent an  arthroscopic evaluation with scar tissue debridement and a postoperative  CPM.  She ended up with over 100 degrees of flexion.  She still states that  the knee is stiff.  Her main complaint is pain in the knee.  She has  undergone lumbar evaluation including MRI for any other causative factors  for her lower extremity pain.  She is not having any left-sided symptoms.  She does not have any groin pain at night or at rest.  Unfortunately, pain  has affected her ability to perform her daily activities.  She was concerned  about her range of motion and constant pain.  She had seen Mila Homer. Sherlean Foot,  M.D. as per Kerrin Champagne, M.D. notes but nothing else was recommended.  She followed up with Ollen Gross, M.D. after being referred over by Kerrin Champagne, M.D.  There was some question of whether she had had a possible  low grade infection but her laboratory  tests have been negative.  She did  undergo a bone scan which did show some increased activity around the right  femoral component.  The main concern was a stress reaction.  Given her pain  and history of stiffness and the fact that she is possibly having a stress  reaction around the femoral component whether it would be loosening or just  reaction around the notching, it was felt that she possibly benefit from  undergoing a revision utilizing a femoral stem to bypass the notch and  possibly revising all three components.  A long discussion ensued with Ollen Gross, M.D. and the patient's family concerning the procedure risks and  possibilities and they decided they would like to proceed with surgery.  The  patient was subsequently admitted to the hospital.  ALLERGIES:  SEPTRA causes hives and difficulty breathing.   CURRENT MEDICATIONS:  1. Calcium tablets b.i.d.  2. Vitamin E 1000 international units daily, stop prior to surgery.  3. Potassium 99 mcg daily.  4. Aspirin 325 mg daily, stop prior to surgery.  5. Lorazepam 1 mg one and one-half tablets at bedtime.  6. Mepergan Fortis p.r.n. pain.  7. Robaxin p.r.n. spasm.   PAST MEDICAL HISTORY:  1. Mitral valve prolapse.  2. Hiatal hernia.  3. Reflux disease.  4. History of ulcers.  5. History of diverticulosis.   PAST SURGICAL HISTORY:  1. Complete hysterectomy.  2. Morton's neuroma bilateral feet.  3. Lumpectomy under the left arm.  4. Bilateral cataract surgery.  5. Primary right total knee October 2002.   SOCIAL HISTORY:  Married.  Nonsmoker.  No alcohol.  One child.  Husband and  daughter will be assisting her with care after surgery.  She lives in a two-  story home with a basement.  There is 11 steps coming up from the basement,  one step into the house from the side door.   REVIEW OF SYSTEMS:  GENERAL:  No fevers, chills, night sweats.  NEUROLOGIC:  No seizures, syncope, paralysis.  RESPIRATORY:  No shortness of  breath,  productive cough, or hemoptysis.  CARDIOVASCULAR:  No chest pain, angina,  orthopnea.  GASTROINTESTINAL:  No nausea, vomiting, diarrhea, constipation.  She does have some reflux disease with hiatal hernia and also ulcers.  GENITOURINARY:  No dysuria, hematuria, discharge.  MUSCULOSKELETAL:  Pertinent to that of the right knee found in the history of present illness.   PHYSICAL EXAMINATION:  VITAL SIGNS:  Pulse 84, respirations 12, blood  pressure 144/88.  GENERAL:  The patient is a 77 year old white female well-nourished, well-  developed, appears in no acute distress.  She is alert, oriented,  cooperative.  HEENT:  Normocephalic, atraumatic.  Pupils round, reactive.  EOMs intact.  NECK:  Supple.  No carotid bruits are appreciated.  CHEST:  Clear to auscultation anterior/posterior chest walls.  No rhonchi,  rales, or wheezing is appreciated.  HEART:  Regular rate and rhythm.  S1, S2 noted.  No rubs, thrills,  palpitations, or murmurs.  ABDOMEN:  Soft, nontender.  Bowel sounds are present.  No organomegaly.  RECTAL:  Not done.  Not pertinent to present illness.  BREASTS:  Not done.  Not pertinent to present illness.  GENITALIA:  Not done.  Not pertinent to present illness.  EXTREMITIES:  Significant to that to the right lower extremity.  She only  has motion of about 5-105 degrees.  Anterior tenderness is noted throughout  the anterior knee.  There is no obvious instability.   IMPRESSION:  1. Painful total knee.  2. Mitral valve prolapse.  3. Hiatal hernia.  4. Reflux disease.  5. History of ulcers.  6. History of diverticulitis.  7. Status post right total knee arthroplasty, October 2002.  8. Status post closed manipulation right total knee.  9. Status post right knee arthroscopy and lysis of adhesions surrounding     right total knee.    PLAN:  The patient will be admitted to [facility] to undergo revision of the right total knee arthroplasty.  Surgery will  be performed by  Ollen Gross, M.D.  The patient's medical physician is Geoffry Paradise,  M.D.  Geoffry Paradise, M.D. will be notified of the room number on admission  and will be consulted if needed for any medical assistance with this patient  throughout the hospital course.     Alexzandrew L. Julien Girt, P.A.              Ollen Gross, M.D.    ALP/MEDQ  D:  02/17/2003  T:  02/17/2003  Job:  [id number]   cc:   Geoffry Paradise, M.D.  [address]  [place]  [place] [address]  Fax: [phone]

## 2011-04-20 NOTE — Procedures (Signed)
[place]  Patient:    Mary Strickland, Mary Strickland Visit Number: [id number] MRN: [id number]          Service Type: SUR Location: [facility] 2550 13 Attending Physician:  Lubertha South Dictated by:   Burna Forts, M.D. Proc. Date: 12/30/01 Admit Date:  12/30/2001   CC:         Anesthesia Dept.   Procedure Report  PREOPERATIVE DIAGNOSIS:  Ms. Nugent is status post total knee replacement in October 2002 and presents today for stiff knee and possible adhesions for a knee manipulation intraoperatively.  It has been requested by Dr. Otelia Sergeant, her attending orthopedic surgeon, that we place an epidural catheter for the knee manipulation and for some postoperative knee manipulation and movement work. I explained the procedure to the patient and decided that we would place both a combined spinal short-acting epidural short-acting spinal and epidural at the time of the procedure.  We did so.  The patient tolerated the placement fine.  A catheter was threaded with ease after the placement of the spinal through a previously placed epidural needle.  This was secured in place with tape.  The procedure was performed by Dr. Otelia Sergeant, which was an injection procedure, followed by a manipulation.  The patient was then removed to the PACU in stable condition. Dictated by:   Burna Forts, M.D. Attending Physician:  Lubertha South DD:  12/30/01 TD:  12/30/01 Job: [id number]

## 2011-04-20 NOTE — Op Note (Signed)
[name], Mary Strickland                            ACCOUNT NO.:  [id number]   MEDICAL RECORD NO.:  [id number]                   PATIENT TYPE:  OIB   LOCATION:  3172                                 FACILITY:  [facility]   PHYSICIAN:  Danae Orleans. Venetia Maxon, M.D.               DATE OF BIRTH:  [birth year]   DATE OF PROCEDURE:  01/04/2004  DATE OF DISCHARGE:                                 OPERATIVE REPORT   PREOPERATIVE DIAGNOSIS:  Chronic intractable back and leg pain.   POSTOPERATIVE DIAGNOSIS:  Chronic intractable back and leg pain.   PROCEDURE:  Percutaneous spinal cord stimulator trial.   SURGEON:  Danae Orleans. Venetia Maxon, M.D.   ANESTHESIA:  MAC with local lidocaine.   COMPLICATIONS:  None.   DISPOSITION:  Recovery.   INDICATIONS:  Mary Strickland is a 77 year old woman who has had multiple right  lower extremity orthopedic procedures with chronic intractable knee pain.  She also has lumbar disk degeneration and multilevel spondylosis with  significant low back pain.  It was elected to take her to surgery for  placement of a percutaneous spinal cord stimulator trial to see if this aid  in relieving her discomfort.   DESCRIPTION OF PROCEDURE:  [name] was brought to the operating room.  Following the satisfactory placement of intravenous lines, she was given IV  sedation, turned prone on the operating table.  Her low back was then  prepped and draped in the usual sterile fashion.  Using a paramedian oblique  approach entering at the L3 pedicle on the left, the skin and subcutaneous  tissues were infiltrated with local lidocaine without epinephrine.  A 14  gauge Tuohy needle was then inserted and using loss of resistance technique  in AP and lateral fluoroscopy.  The trajectory of the needle was seen to be  going into the epidural space entering it at L2 at the L2-3 level.  Upon  encountering loss of resistance with the glass syringe, the electrode was  inserted into the epidural space.  This proved  quite difficult to steer in  the epidural space but it was steered up to the level of T10 to the right  side of midline which was the planned site for positioning the electrode and  using intraoperative stimulation with her awake.  The patient had  significant relief of her right lower extremity pain centered at her knee.  Subsequently, the stylet was withdrawn.  The Tuohy needle was removed.  The  locking anchor was placed with 2-0 silk stitches at the skin and sterile  occlusive dressing was placed and the lead extension was placed.  Final x-  ray demonstrates the lead just to the right of midline with the top three  electrodes centered at T10.  The patient tolerated the procedure well and  was taken to recovery in stable and satisfactory condition having tolerated  the operation without difficulty.  Danae Orleans. Venetia Maxon, M.D.    JDS/MEDQ  D:  01/04/2004  T:  01/04/2004  Job:  [id number]

## 2011-05-15 ENCOUNTER — Telehealth: Payer: Self-pay | Admitting: Cardiology

## 2011-05-15 NOTE — Telephone Encounter (Signed)
Called requesting samples of Bystolic 5 mg; have 10 mg; advised to take 1/2 tablet daily. Will pick up

## 2011-05-15 NOTE — Telephone Encounter (Signed)
Called in wanting to know if there were any samples of Bystolic available for her to have. Please call back by ten if you could. She is going to stop by today to take her husband to a doctor's appointment and check to see if the samples are ready.

## 2011-05-24 ENCOUNTER — Other Ambulatory Visit: Payer: Self-pay | Admitting: *Deleted

## 2011-05-24 MED ORDER — HYDROCHLOROTHIAZIDE 25 MG PO TABS: 25.0000 mg | ORAL_TABLET | Freq: Every day | ORAL | Status: DC

## 2011-05-24 NOTE — Telephone Encounter (Signed)
escribe medication per fax request  

## 2011-06-22 ENCOUNTER — Other Ambulatory Visit: Payer: Self-pay | Admitting: *Deleted

## 2011-06-22 NOTE — Telephone Encounter (Signed)
escribe medication per fax request  

## 2011-07-10 ENCOUNTER — Telehealth: Payer: Self-pay | Admitting: Cardiology

## 2011-07-10 NOTE — Telephone Encounter (Signed)
Pt would like to know if we have samples for Bystolic 5mg  and if she can pick them up, please call pt back [phone] cell #

## 2011-07-10 NOTE — Telephone Encounter (Signed)
Lm that we do have samples of Bystolic 5 mg. Will pick up.

## 2011-08-09 ENCOUNTER — Telehealth: Payer: Self-pay | Admitting: Cardiology

## 2011-08-09 NOTE — Telephone Encounter (Signed)
Called because she wanted to know it we had any samples of Bystolic available for her. Please call back. I have pulled her chart.

## 2011-08-10 NOTE — Telephone Encounter (Signed)
Called requesting samples of Bystolic 5 mg. Have available, will pick up

## 2011-08-27 LAB — BASIC METABOLIC PANEL
BUN: 17
CO2: 31
Calcium: 10.6 — ABNORMAL HIGH
Chloride: 103
Creatinine, Ser: 0.98
GFR calc Af Amer: >60
GFR calc non Af Amer: 55 — ABNORMAL LOW
Glucose, Bld: 93
Potassium: 4.5
Sodium: 142

## 2011-08-27 LAB — CBC
HCT: 43.2
Hemoglobin: 14.3
MCHC: 33.1
MCV: 89.9
RBC: 4.8
RDW: 12.8

## 2011-08-28 LAB — BASIC METABOLIC PANEL
CO2: 27
Calcium: 8.5
Chloride: 101
GFR calc Af Amer: >60
Glucose, Bld: 130 — ABNORMAL HIGH
Potassium: 4.1
Sodium: 135

## 2011-08-28 LAB — CBC
HCT: 26.5 — ABNORMAL LOW
Hemoglobin: 9 — ABNORMAL LOW
MCHC: 33.9
MCHC: 33.7
MCV: 91.2
MCV: 91.4
RBC: 2.91 — ABNORMAL LOW
RBC: 3.93
RDW: 13.5

## 2011-08-28 LAB — POCT I-STAT, CHEM 8
Chloride: 107
Creatinine, Ser: 0.9
Glucose, Bld: 127 — ABNORMAL HIGH
Potassium: 5.2 — ABNORMAL HIGH

## 2011-08-28 LAB — DIFFERENTIAL
Basophils Absolute: 0
Basophils Relative: 0
Eosinophils Absolute: 0
Monocytes Relative: 11
Neutrophils Relative %: 72

## 2011-08-28 LAB — POCT CARDIAC MARKERS: Myoglobin, poc: 60.4

## 2011-09-18 ENCOUNTER — Telehealth: Payer: Self-pay | Admitting: Cardiology

## 2011-09-18 NOTE — Telephone Encounter (Signed)
Pt requesting bostolic samples, can call after 130pm

## 2011-09-18 NOTE — Telephone Encounter (Signed)
Called wanting to know if we had samples of Bystolic 5 mg. Have available and she will pick up.

## 2011-10-31 ENCOUNTER — Other Ambulatory Visit: Payer: Self-pay | Admitting: Cardiology

## 2011-10-31 NOTE — Telephone Encounter (Signed)
FU Call: Pt calling to regarding wanting samples of bysoltic. Please return pt call to discuss further.r

## 2011-11-07 NOTE — Telephone Encounter (Signed)
FU Call: Pt call wanting to know if she can get some samples of bystolic. Please return pt call to discuss further.

## 2011-11-07 NOTE — Telephone Encounter (Signed)
Advised will give her some samples of Bystolic 5mg  but she does need to make an appointment to see Dr. [place]. She will make when comes to pick up medications.

## 2011-12-13 ENCOUNTER — Encounter: Payer: Self-pay | Admitting: *Deleted

## 2011-12-13 DIAGNOSIS — G5760 Lesion of plantar nerve, unspecified lower limb: Secondary | ICD-10-CM | POA: Insufficient documentation

## 2011-12-13 DIAGNOSIS — R002 Palpitations: Secondary | ICD-10-CM | POA: Insufficient documentation

## 2011-12-13 DIAGNOSIS — M549 Dorsalgia, unspecified: Secondary | ICD-10-CM | POA: Insufficient documentation

## 2011-12-13 DIAGNOSIS — K219 Gastro-esophageal reflux disease without esophagitis: Secondary | ICD-10-CM | POA: Insufficient documentation

## 2011-12-13 DIAGNOSIS — E785 Hyperlipidemia, unspecified: Secondary | ICD-10-CM | POA: Insufficient documentation

## 2011-12-13 DIAGNOSIS — R0989 Other specified symptoms and signs involving the circulatory and respiratory systems: Secondary | ICD-10-CM | POA: Insufficient documentation

## 2011-12-18 ENCOUNTER — Encounter: Payer: Self-pay | Admitting: Cardiology

## 2011-12-18 ENCOUNTER — Ambulatory Visit (INDEPENDENT_AMBULATORY_CARE_PROVIDER_SITE_OTHER): Payer: Medicare Other | Admitting: Cardiology

## 2011-12-18 VITALS — BP 142/70 | HR 88 | Ht 62.0 in | Wt 144.8 lb

## 2011-12-18 DIAGNOSIS — R002 Palpitations: Secondary | ICD-10-CM

## 2011-12-18 DIAGNOSIS — I10 Essential (primary) hypertension: Secondary | ICD-10-CM | POA: Diagnosis not present

## 2011-12-18 DIAGNOSIS — E785 Hyperlipidemia, unspecified: Secondary | ICD-10-CM

## 2011-12-18 MED ORDER — NEBIVOLOL HCL 5 MG PO TABS: 5.0000 mg | ORAL_TABLET | Freq: Every day | ORAL | Status: DC

## 2011-12-18 NOTE — Assessment & Plan Note (Signed)
Blood pressure is mildly elevated today but she hasn't taken one of her medications within the past week. We did renew this prescription and gave her samples. Since she has no active cardiac problems we will see her back as needed.

## 2011-12-18 NOTE — Progress Notes (Signed)
Mary Strickland Date of Birth: Jan 24, 1930 Medical Record #[id number]  History of Present Illness: Mary Strickland is seen for yearly followup. She has been evaluated by [name] in the past for palpitations. She also has a history of hypertension and hyperlipidemia. Prior echocardiogram in 2009 was unremarkable. She currently denies any palpitations, dyspnea, or chest pain. She ran out of her bystolic one week ago. Her hydrochlorothiazide and calcium supplements were discontinued recently because of hypercalcemia.  Current Outpatient Prescriptions on File Prior to Visit  Medication Sig Dispense Refill  . AMITRIPTYLINE HCL PO Take by mouth. At [facility]      . aspirin 81 MG tablet Take 81 mg by mouth daily.      . diazepam (VALIUM) 5 MG tablet Take 5 mg by mouth daily. 1/2 -1 daily      . folic acid (FOLVITE) 800 MCG tablet Take 800 mcg by mouth daily.      Marland Kitchen levothyroxine (SYNTHROID, LEVOTHROID) 25 MCG tablet Take 25 mcg by mouth daily.      . Omega-3 Fatty Acids (FISH OIL) 1000 MG CAPS Take by mouth 2 (two) times daily.      Marland Kitchen omeprazole (PRILOSEC) 20 MG capsule Take 20 mg by mouth 2 (two) times daily.      Marland Kitchen POTASSIUM PO Take by mouth. Taking otc daily      . temazepam (RESTORIL) 15 MG capsule Take 15 mg by mouth at bedtime as needed.      . hydrochlorothiazide 25 MG tablet Take 1 tablet (25 mg total) by mouth daily.  30 tablet  5    Allergies  Allergen Reactions  . Percocet (Oxycodone-Acetaminophen)   . Sulfamethoxazole W/Trimethoprim     REACTION: SOB, itching,  . Zocor (Simvastatin)     Past Medical History  Diagnosis Date  . Hypertension   . Dyslipidemia   . Palpitations     hx. of  . GERD (gastroesophageal reflux disease)   . Neuroma, Morton's     both feet,surgery done  . Back pain   . Right carotid bruit   . Psychosis   . Idiopathic scoliosis   . Lumbosacral spondylosis   . Mitral valve prolapse   . Anxiety   . Blood loss anemia     Mild postoperative blood loss anemia  . History  of diverticulitis of colon   . Ulcer     History of ulcers    Past Surgical History  Procedure Date  . Total knee arthroplasty October 2002    right  . Other surgical history     hysterectomy  . Cataracts     both eyes  . Excision morton's neuroma     both feet  . Lumbar fusion   . Other surgical history     left breast biopsy/lymph node    History  Smoking status  . Former Smoker  Smokeless tobacco  . Not on file    History  Alcohol Use     Family History  Problem Relation Age of Onset  . Heart disease Father   . Heart attack Father   . Heart disease Mother     Review of Systems: The review of systems is positive for chronic stress related to caring for her husband who has Alzheimer's..  All other systems were reviewed and are negative.  Physical Exam: BP 142/70  Pulse 88  Ht 5\' 2"  (1.575 m)  Wt 144 lb 12.8 oz (65.681 kg)  BMI 26.48 kg/m2 She is an elderly  female in no acute distress. Her HEENT exam is unremarkable. She has no JVD or bruits. Lungs are clear. Cardiac exam reveals a regular rate and rhythm without gallop, murmur, or click. Abdomen is soft and nontender. She has no edema. Pedal pulses are good. LABORATORY DATA: ECG today demonstrates normal sinus rhythm with low voltage. It is otherwise normal.  Assessment / Plan:

## 2011-12-18 NOTE — Patient Instructions (Signed)
We have refilled your Bystolic.  I will see you again as needed.

## 2011-12-18 NOTE — Assessment & Plan Note (Signed)
Palpitations have resolved. No further therapy indicated.

## 2012-01-17 ENCOUNTER — Telehealth: Payer: Self-pay | Admitting: Cardiology

## 2012-01-17 NOTE — Telephone Encounter (Signed)
Patient called wanting samples of bystolic.Samples of bystolic 5 mg left at front desk 3rd floor.

## 2012-01-17 NOTE — Telephone Encounter (Signed)
New Problem   Patient would like a return call at hm# [phone] concerning bystolic 5 mg samples.

## 2012-01-17 NOTE — Telephone Encounter (Signed)
Patient called no answer,LMTC 

## 2012-03-24 DIAGNOSIS — M545 Low back pain, unspecified: Secondary | ICD-10-CM | POA: Diagnosis not present

## 2012-03-24 DIAGNOSIS — L821 Other seborrheic keratosis: Secondary | ICD-10-CM | POA: Diagnosis not present

## 2012-03-31 DIAGNOSIS — M533 Sacrococcygeal disorders, not elsewhere classified: Secondary | ICD-10-CM | POA: Diagnosis not present

## 2012-04-21 ENCOUNTER — Telehealth: Payer: Self-pay | Admitting: Cardiology

## 2012-04-21 NOTE — Telephone Encounter (Signed)
New msg Pt wants to know if have samples of bystolic 5 mg. Pleas let her know

## 2012-06-19 DIAGNOSIS — M533 Sacrococcygeal disorders, not elsewhere classified: Secondary | ICD-10-CM | POA: Diagnosis not present

## 2012-06-19 DIAGNOSIS — M545 Low back pain, unspecified: Secondary | ICD-10-CM | POA: Diagnosis not present

## 2012-08-07 DIAGNOSIS — Z23 Encounter for immunization: Secondary | ICD-10-CM | POA: Diagnosis not present

## 2012-08-15 ENCOUNTER — Telehealth: Payer: Self-pay | Admitting: Cardiology

## 2012-08-15 NOTE — Telephone Encounter (Signed)
Patient called was told bystolic 5mg  samples left at front desk on 3rd floor.

## 2012-08-15 NOTE — Telephone Encounter (Signed)
Pt calling to see if we have samples of bystolic, pls call [phone]

## 2012-09-02 ENCOUNTER — Telehealth: Payer: Self-pay | Admitting: Cardiology

## 2012-09-02 NOTE — Telephone Encounter (Signed)
Patient called was told no samples of bystolic.Advised to call back next week to check for samples.

## 2012-09-02 NOTE — Telephone Encounter (Signed)
plz return call to pt [phone] regarding Bystolic samples.

## 2012-09-04 DIAGNOSIS — K219 Gastro-esophageal reflux disease without esophagitis: Secondary | ICD-10-CM | POA: Diagnosis not present

## 2012-09-04 DIAGNOSIS — M1990 Unspecified osteoarthritis, unspecified site: Secondary | ICD-10-CM | POA: Diagnosis not present

## 2012-09-04 DIAGNOSIS — I10 Essential (primary) hypertension: Secondary | ICD-10-CM | POA: Diagnosis not present

## 2012-09-04 DIAGNOSIS — E039 Hypothyroidism, unspecified: Secondary | ICD-10-CM | POA: Diagnosis not present

## 2012-09-19 ENCOUNTER — Telehealth: Payer: Self-pay | Admitting: Cardiology

## 2012-09-19 NOTE — Telephone Encounter (Signed)
plz return call to patient at hm# regarding Bystolic samples 5 mg

## 2012-09-19 NOTE — Telephone Encounter (Signed)
Patient called was told office out of bystolic samples.

## 2012-09-22 ENCOUNTER — Telehealth: Payer: Self-pay | Admitting: Cardiology

## 2012-09-22 NOTE — Telephone Encounter (Signed)
Samples of bystolic 10mg  left at front desk.  I wrote on the bottle in two places and on the box to only take one-half tablet daily.  She was also told this verbally.

## 2012-09-22 NOTE — Telephone Encounter (Signed)
Follow-up:     Please reutrn call.

## 2012-09-22 NOTE — Telephone Encounter (Signed)
Pt needs to know if we have any samples of bystolic 5mg  even if you only have 10mg  that is ok because she has a cutter and can cut them

## 2012-10-28 ENCOUNTER — Telehealth: Payer: Self-pay | Admitting: Cardiology

## 2012-10-28 NOTE — Telephone Encounter (Signed)
Patient called was told samples of bystolic 5 mg left at 3rd floor front desk.

## 2012-10-28 NOTE — Telephone Encounter (Signed)
Pt needs samples of bistolic 5mg  or what ever we have and she has to go out so you can leave a message if she doesn't answer

## 2012-11-28 DIAGNOSIS — Z961 Presence of intraocular lens: Secondary | ICD-10-CM | POA: Diagnosis not present

## 2012-11-28 DIAGNOSIS — H43399 Other vitreous opacities, unspecified eye: Secondary | ICD-10-CM | POA: Diagnosis not present

## 2012-11-28 DIAGNOSIS — H35039 Hypertensive retinopathy, unspecified eye: Secondary | ICD-10-CM | POA: Diagnosis not present

## 2012-11-28 DIAGNOSIS — H35379 Puckering of macula, unspecified eye: Secondary | ICD-10-CM | POA: Diagnosis not present

## 2012-11-28 DIAGNOSIS — H538 Other visual disturbances: Secondary | ICD-10-CM | POA: Diagnosis not present

## 2012-11-28 DIAGNOSIS — H179 Unspecified corneal scar and opacity: Secondary | ICD-10-CM | POA: Diagnosis not present

## 2012-12-04 ENCOUNTER — Telehealth: Payer: Self-pay | Admitting: Cardiology

## 2012-12-04 NOTE — Telephone Encounter (Signed)
New Problem:     Patient called in wanting to know if there were any samples of nebivolol (BYSTOLIC) 5 MG tablet available for her to have.  Please call back.

## 2012-12-04 NOTE — Telephone Encounter (Signed)
No samples available. Pt requested I forward to Mary Strickland so that she can continue to look for her.

## 2013-01-06 ENCOUNTER — Telehealth: Payer: Self-pay | Admitting: Cardiology

## 2013-01-06 NOTE — Telephone Encounter (Signed)
New Problem     Pt requesting samples of bystolic 5 mg.

## 2013-01-06 NOTE — Telephone Encounter (Signed)
Spoke with pt and told her I would leave samples at front desk for her to pick up.  Bystolic 5 mg, 28 tablets, Lot [id number], exp 2/16 left at front desk.

## 2013-01-30 ENCOUNTER — Encounter: Payer: Self-pay | Admitting: Cardiology

## 2013-01-30 ENCOUNTER — Telehealth: Payer: Self-pay | Admitting: Cardiology

## 2013-01-30 NOTE — Telephone Encounter (Signed)
Patient called no answer.Left message on personal voice mail office out of Bystolic.

## 2013-01-30 NOTE — Telephone Encounter (Signed)
New Prob    Pt requesting samples of Bystolic.

## 2013-02-03 ENCOUNTER — Telehealth: Payer: Self-pay | Admitting: Cardiology

## 2013-02-03 NOTE — Telephone Encounter (Signed)
Patient called was told bystolic 10 mg samples(take 1/2 tablet) samples left at 3rd floor front desk.

## 2013-02-03 NOTE — Telephone Encounter (Signed)
New Prob   Requesting samples of bystolic 5/10 mg.

## 2013-03-04 DIAGNOSIS — R82998 Other abnormal findings in urine: Secondary | ICD-10-CM | POA: Diagnosis not present

## 2013-03-04 DIAGNOSIS — I10 Essential (primary) hypertension: Secondary | ICD-10-CM | POA: Diagnosis not present

## 2013-03-04 DIAGNOSIS — E785 Hyperlipidemia, unspecified: Secondary | ICD-10-CM | POA: Diagnosis not present

## 2013-03-04 DIAGNOSIS — E039 Hypothyroidism, unspecified: Secondary | ICD-10-CM | POA: Diagnosis not present

## 2013-03-11 DIAGNOSIS — M1990 Unspecified osteoarthritis, unspecified site: Secondary | ICD-10-CM | POA: Diagnosis not present

## 2013-03-11 DIAGNOSIS — E785 Hyperlipidemia, unspecified: Secondary | ICD-10-CM | POA: Diagnosis not present

## 2013-03-11 DIAGNOSIS — K589 Irritable bowel syndrome without diarrhea: Secondary | ICD-10-CM | POA: Diagnosis not present

## 2013-03-11 DIAGNOSIS — E039 Hypothyroidism, unspecified: Secondary | ICD-10-CM | POA: Diagnosis not present

## 2013-03-11 DIAGNOSIS — Z1331 Encounter for screening for depression: Secondary | ICD-10-CM | POA: Diagnosis not present

## 2013-03-11 DIAGNOSIS — K5730 Diverticulosis of large intestine without perforation or abscess without bleeding: Secondary | ICD-10-CM | POA: Diagnosis not present

## 2013-03-11 DIAGNOSIS — Z0000 Encounter for general adult medical examination without abnormal findings: Secondary | ICD-10-CM | POA: Diagnosis not present

## 2013-03-11 DIAGNOSIS — I10 Essential (primary) hypertension: Secondary | ICD-10-CM | POA: Diagnosis not present

## 2013-03-11 DIAGNOSIS — K219 Gastro-esophageal reflux disease without esophagitis: Secondary | ICD-10-CM | POA: Diagnosis not present

## 2013-03-13 ENCOUNTER — Other Ambulatory Visit: Payer: Self-pay | Admitting: Internal Medicine

## 2013-03-13 DIAGNOSIS — Z1231 Encounter for screening mammogram for malignant neoplasm of breast: Secondary | ICD-10-CM

## 2013-04-02 ENCOUNTER — Ambulatory Visit
Admission: RE | Admit: 2013-04-02 | Discharge: 2013-04-02 | Disposition: A | Discharge status: Home / Self Care | Payer: Medicare Other | Source: Ambulatory Visit | Attending: Internal Medicine | Admitting: Internal Medicine

## 2013-04-02 DIAGNOSIS — Z1231 Encounter for screening mammogram for malignant neoplasm of breast: Secondary | ICD-10-CM | POA: Diagnosis not present

## 2013-05-27 ENCOUNTER — Telehealth: Payer: Self-pay | Admitting: Cardiology

## 2013-05-27 ENCOUNTER — Other Ambulatory Visit: Payer: Self-pay | Admitting: Cardiology

## 2013-05-27 NOTE — Telephone Encounter (Signed)
New problem   Pt calling for samples of bystolic 5 mg or 10 mg(will cut them)

## 2013-05-27 NOTE — Telephone Encounter (Signed)
Returned call to patient no answer.Left message on personal voice mail office out of bystolic samples.

## 2013-06-02 ENCOUNTER — Telehealth: Payer: Self-pay | Admitting: Cardiology

## 2013-06-02 NOTE — Telephone Encounter (Signed)
Returned call to patient office out of bystolic samples.

## 2013-06-02 NOTE — Telephone Encounter (Signed)
New problem  Pt is wanting to know if you have any samples of Bystolic 5 mg.

## 2013-08-19 DIAGNOSIS — Z23 Encounter for immunization: Secondary | ICD-10-CM | POA: Diagnosis not present

## 2013-08-24 ENCOUNTER — Telehealth: Payer: Self-pay

## 2013-08-24 NOTE — Telephone Encounter (Signed)
Called wanted samples of bystolic 10 or 5 mg I gave her samples of 5 mg 3 weeks worth 

## 2013-08-28 ENCOUNTER — Encounter: Payer: Self-pay | Admitting: *Deleted

## 2013-09-02 DIAGNOSIS — Z6828 Body mass index (BMI) 28.0-28.9, adult: Secondary | ICD-10-CM | POA: Diagnosis not present

## 2013-09-02 DIAGNOSIS — R05 Cough: Secondary | ICD-10-CM | POA: Diagnosis not present

## 2013-09-02 DIAGNOSIS — I10 Essential (primary) hypertension: Secondary | ICD-10-CM | POA: Diagnosis not present

## 2013-09-02 DIAGNOSIS — E785 Hyperlipidemia, unspecified: Secondary | ICD-10-CM | POA: Diagnosis not present

## 2013-09-02 DIAGNOSIS — E039 Hypothyroidism, unspecified: Secondary | ICD-10-CM | POA: Diagnosis not present

## 2013-09-02 DIAGNOSIS — R059 Cough, unspecified: Secondary | ICD-10-CM | POA: Diagnosis not present

## 2013-09-02 DIAGNOSIS — K589 Irritable bowel syndrome without diarrhea: Secondary | ICD-10-CM | POA: Diagnosis not present

## 2013-10-16 ENCOUNTER — Telehealth: Payer: Self-pay | Admitting: Cardiology

## 2013-10-16 NOTE — Telephone Encounter (Signed)
samples

## 2013-10-16 NOTE — Telephone Encounter (Deleted)
Error:  Pt requesting samples. Transferred to pt advocate team

## 2013-11-20 ENCOUNTER — Telehealth: Payer: Self-pay

## 2013-11-20 NOTE — Telephone Encounter (Signed)
Bystolic 5 mg samples left at 3rd floor front desk.

## 2013-12-09 DIAGNOSIS — R05 Cough: Secondary | ICD-10-CM | POA: Diagnosis not present

## 2013-12-09 DIAGNOSIS — R059 Cough, unspecified: Secondary | ICD-10-CM | POA: Diagnosis not present

## 2013-12-09 DIAGNOSIS — J209 Acute bronchitis, unspecified: Secondary | ICD-10-CM | POA: Diagnosis not present

## 2013-12-09 DIAGNOSIS — Z6828 Body mass index (BMI) 28.0-28.9, adult: Secondary | ICD-10-CM | POA: Diagnosis not present

## 2013-12-28 ENCOUNTER — Other Ambulatory Visit: Payer: Self-pay

## 2013-12-28 NOTE — Telephone Encounter (Signed)
Patient needs samples of bystolic and they were placed up front

## 2014-02-22 ENCOUNTER — Other Ambulatory Visit: Payer: Self-pay

## 2014-02-22 DIAGNOSIS — Z1231 Encounter for screening mammogram for malignant neoplasm of breast: Secondary | ICD-10-CM

## 2014-02-24 ENCOUNTER — Telehealth: Payer: Self-pay | Admitting: *Deleted

## 2014-02-24 NOTE — Telephone Encounter (Signed)
Patient requests bystolic samples. I will place them at the front desk for pick up.

## 2014-03-10 DIAGNOSIS — I10 Essential (primary) hypertension: Secondary | ICD-10-CM | POA: Diagnosis not present

## 2014-03-10 DIAGNOSIS — E039 Hypothyroidism, unspecified: Secondary | ICD-10-CM | POA: Diagnosis not present

## 2014-03-10 DIAGNOSIS — E785 Hyperlipidemia, unspecified: Secondary | ICD-10-CM | POA: Diagnosis not present

## 2014-03-10 DIAGNOSIS — R82998 Other abnormal findings in urine: Secondary | ICD-10-CM | POA: Diagnosis not present

## 2014-03-10 DIAGNOSIS — R809 Proteinuria, unspecified: Secondary | ICD-10-CM | POA: Diagnosis not present

## 2014-03-17 DIAGNOSIS — K589 Irritable bowel syndrome without diarrhea: Secondary | ICD-10-CM | POA: Diagnosis not present

## 2014-03-17 DIAGNOSIS — Z23 Encounter for immunization: Secondary | ICD-10-CM | POA: Diagnosis not present

## 2014-03-17 DIAGNOSIS — K5730 Diverticulosis of large intestine without perforation or abscess without bleeding: Secondary | ICD-10-CM | POA: Diagnosis not present

## 2014-03-17 DIAGNOSIS — Z1331 Encounter for screening for depression: Secondary | ICD-10-CM | POA: Diagnosis not present

## 2014-03-17 DIAGNOSIS — E039 Hypothyroidism, unspecified: Secondary | ICD-10-CM | POA: Diagnosis not present

## 2014-03-17 DIAGNOSIS — I10 Essential (primary) hypertension: Secondary | ICD-10-CM | POA: Diagnosis not present

## 2014-03-17 DIAGNOSIS — K219 Gastro-esophageal reflux disease without esophagitis: Secondary | ICD-10-CM | POA: Diagnosis not present

## 2014-03-17 DIAGNOSIS — E785 Hyperlipidemia, unspecified: Secondary | ICD-10-CM | POA: Diagnosis not present

## 2014-03-17 DIAGNOSIS — Z0000 Encounter for general adult medical examination without abnormal findings: Secondary | ICD-10-CM | POA: Diagnosis not present

## 2014-03-31 ENCOUNTER — Telehealth: Payer: Self-pay | Admitting: *Deleted

## 2014-03-31 NOTE — Telephone Encounter (Signed)
Patient requests bystolic samples. I will place at the front desk for pick up. Also, does she need an appt with Dr [place]? It has been over 2 years since she has been seen but she is on prn follow up. Please advise. Thanks, MI

## 2014-04-01 NOTE — Telephone Encounter (Signed)
Patient will need to request samples and refills with PCP since she is a prn follow up.

## 2014-04-05 ENCOUNTER — Ambulatory Visit
Admission: RE | Admit: 2014-04-05 | Discharge: 2014-04-05 | Disposition: A | Discharge status: Home / Self Care | Payer: Medicare Other | Source: Ambulatory Visit

## 2014-04-05 ENCOUNTER — Encounter (INDEPENDENT_AMBULATORY_CARE_PROVIDER_SITE_OTHER): Payer: Self-pay

## 2014-04-05 DIAGNOSIS — Z1231 Encounter for screening mammogram for malignant neoplasm of breast: Secondary | ICD-10-CM

## 2014-04-28 ENCOUNTER — Telehealth: Payer: Self-pay | Admitting: *Deleted

## 2014-04-28 NOTE — Telephone Encounter (Signed)
Bystolic samples provided for patient. 

## 2014-05-25 ENCOUNTER — Telehealth: Payer: Self-pay

## 2014-05-25 NOTE — Telephone Encounter (Signed)
Patient called for samples of bystolic 5 mg placed samples up front

## 2014-06-07 ENCOUNTER — Ambulatory Visit (INDEPENDENT_AMBULATORY_CARE_PROVIDER_SITE_OTHER): Payer: Medicare Other | Admitting: Cardiology

## 2014-06-07 ENCOUNTER — Encounter: Payer: Self-pay | Admitting: Cardiology

## 2014-06-07 VITALS — BP 142/77 | HR 75 | Ht 63.0 in | Wt 162.0 lb

## 2014-06-07 DIAGNOSIS — I10 Essential (primary) hypertension: Secondary | ICD-10-CM | POA: Diagnosis not present

## 2014-06-07 DIAGNOSIS — E785 Hyperlipidemia, unspecified: Secondary | ICD-10-CM

## 2014-06-07 NOTE — Patient Instructions (Addendum)
Continue your current therapy  I will see you in one year.  Stay active and walk regularly

## 2014-06-08 NOTE — Progress Notes (Signed)
Mary Strickland Date of Birth: December 10, 1929 Medical Record #[id number]  History of Present Illness: Mary Strickland is seen for  followup. She has been evaluated by [name] in the past for palpitations. Last seen in Jan. 2013. She also has a history of hypertension and hyperlipidemia. Prior echocardiogram in 2009 was unremarkable. She currently denies any palpitations, dyspnea, or chest pain. She reports increased stress this year with her husband passing away with Alzhiemers.   Current Outpatient Prescriptions on File Prior to Visit  Medication Sig Dispense Refill  . AMITRIPTYLINE HCL PO Take by mouth. At [facility]      . aspirin 81 MG tablet Take 81 mg by mouth daily.      Marland Kitchen BYSTOLIC 5 MG tablet TAKE 1 TABLET (5 MG TOTAL) BY MOUTH DAILY.  30 tablet  5  . diazepam (VALIUM) 5 MG tablet Take 5 mg by mouth daily. 1/2 -1 daily      . levothyroxine (SYNTHROID, LEVOTHROID) 25 MCG tablet Take 25 mcg by mouth daily.      . temazepam (RESTORIL) 15 MG capsule Take 15 mg by mouth at bedtime as needed.       No current facility-administered medications on file prior to visit.    Allergies  Allergen Reactions  . Percocet [Oxycodone-Acetaminophen]   . Sulfamethoxazole-Trimethoprim     REACTION: SOB, itching,  . Zocor [Simvastatin]     Past Medical History  Diagnosis Date  . Hypertension   . Dyslipidemia   . Palpitations     hx. of  . GERD (gastroesophageal reflux disease)   . Neuroma, Morton's     both feet,surgery done  . Back pain   . Right carotid bruit   . Psychosis   . Idiopathic scoliosis   . Lumbosacral spondylosis   . Mitral valve prolapse   . Anxiety   . Blood loss anemia     Mild postoperative blood loss anemia  . History of diverticulitis of colon   . Ulcer     History of ulcers    Past Surgical History  Procedure Laterality Date  . Total knee arthroplasty  October 2002    right  . Other surgical history      hysterectomy  . Cataracts      both eyes  . Excision morton's neuroma        both feet  . Lumbar fusion    . Other surgical history      left breast biopsy/lymph node    History  Smoking status  . Former Smoker  Smokeless tobacco  . Not on file    History  Alcohol Use     Family History  Problem Relation Age of Onset  . Heart disease Father   . Heart attack Father   . Heart disease Mother     Review of Systems: As noted in HPI.  All other systems were reviewed and are negative.  Physical Exam: BP 142/77  Pulse 75  Ht 5\' 3"  (1.6 m)  Wt 162 lb (73.483 kg)  BMI 28.70 kg/m2 She is an elderly female in no acute distress. Her HEENT exam is unremarkable. She has no JVD or bruits. Lungs are clear. Cardiac exam reveals a regular rate and rhythm without gallop, murmur, or click. Abdomen is soft and nontender. No masses or bruits. She has no edema. Pedal pulses are good.  LABORATORY DATA: ECG today demonstrates normal sinus rhythm with  Normal Ecg.  Assessment / Plan: 1. HTN controlled. Continue Bystolic.  2. Palpitations-resolved.   Follow up in one year.

## 2014-07-02 ENCOUNTER — Telehealth: Payer: Self-pay

## 2014-07-02 NOTE — Telephone Encounter (Signed)
Patient called for samples of bystolic placed samples up front

## 2014-07-05 DIAGNOSIS — IMO0002 Reserved for concepts with insufficient information to code with codable children: Secondary | ICD-10-CM | POA: Diagnosis not present

## 2014-07-05 DIAGNOSIS — M961 Postlaminectomy syndrome, not elsewhere classified: Secondary | ICD-10-CM | POA: Diagnosis not present

## 2014-07-05 DIAGNOSIS — M545 Low back pain, unspecified: Secondary | ICD-10-CM | POA: Diagnosis not present

## 2014-07-05 DIAGNOSIS — M47817 Spondylosis without myelopathy or radiculopathy, lumbosacral region: Secondary | ICD-10-CM | POA: Diagnosis not present

## 2014-07-13 DIAGNOSIS — R059 Cough, unspecified: Secondary | ICD-10-CM | POA: Diagnosis not present

## 2014-07-13 DIAGNOSIS — K219 Gastro-esophageal reflux disease without esophagitis: Secondary | ICD-10-CM | POA: Diagnosis not present

## 2014-07-13 DIAGNOSIS — Z6829 Body mass index (BMI) 29.0-29.9, adult: Secondary | ICD-10-CM | POA: Diagnosis not present

## 2014-07-13 DIAGNOSIS — R05 Cough: Secondary | ICD-10-CM | POA: Diagnosis not present

## 2014-08-04 ENCOUNTER — Encounter: Payer: Self-pay | Admitting: Podiatry

## 2014-08-04 ENCOUNTER — Ambulatory Visit (INDEPENDENT_AMBULATORY_CARE_PROVIDER_SITE_OTHER): Payer: Medicare Other

## 2014-08-04 ENCOUNTER — Ambulatory Visit (INDEPENDENT_AMBULATORY_CARE_PROVIDER_SITE_OTHER): Payer: Medicare Other | Admitting: Podiatry

## 2014-08-04 VITALS — BP 106/81 | HR 68 | Resp 16

## 2014-08-04 DIAGNOSIS — G589 Mononeuropathy, unspecified: Secondary | ICD-10-CM

## 2014-08-04 DIAGNOSIS — M79673 Pain in unspecified foot: Secondary | ICD-10-CM

## 2014-08-04 DIAGNOSIS — M779 Enthesopathy, unspecified: Secondary | ICD-10-CM

## 2014-08-04 DIAGNOSIS — M79609 Pain in unspecified limb: Secondary | ICD-10-CM | POA: Diagnosis not present

## 2014-08-04 MED ORDER — GABAPENTIN 300 MG PO CAPS: 300.0000 mg | ORAL_CAPSULE | Freq: Three times a day (TID) | ORAL | Status: DC

## 2014-08-04 NOTE — Progress Notes (Signed)
   Subjective:    Patient ID: Mary Strickland, female    DOB: 06-02-1930, 80 y.o.   MRN: [id number]  HPI Comments: "I have pain all over my feet"  Patient c/o discomfort bilateral feet for several months. She says they feel and sometimes look swollen. Some numbness and burning. States they look red sometimes. She did say she had a lot of back problems. She has tried Vicks vapor rub.     Review of Systems  HENT: Positive for hearing loss.   Respiratory: Positive for cough.   Musculoskeletal: Positive for arthralgias, back pain and myalgias.  All other systems reviewed and are negative.      Objective:   Physical Exam        Assessment & Plan:

## 2014-08-05 ENCOUNTER — Telehealth: Payer: Self-pay | Admitting: *Deleted

## 2014-08-05 NOTE — Progress Notes (Signed)
Subjective:     Patient ID: Mary Strickland, female   DOB: 1930-08-14, 80 y.o.   MRN: [id number]  Foot Pain   patient presents stating she is getting pain all over her feet for the last few months and is not sure why. Has a lot of back problems hip and knee problems also   Review of Systems  All other systems reviewed and are negative.      Objective:   Physical Exam  Nursing note and vitals reviewed. Constitutional: She is oriented to person, place, and time.  Cardiovascular: Intact distal pulses.   Musculoskeletal: Normal range of motion.  Neurological: She is oriented to person, place, and time.  Skin: Skin is warm.   neurovascular status intact with muscle strength adequate and range of motion subtalar midtarsal joint moderately diminished both feet. Patient does have diminishment of sharp dull and vibratory bilateral and is noted to have good digital perfusion and does have well-developed arch upon weightbearing. Patient's found to have moderate discomfort from the heel to the arch but I was unable to find any area where it appeared to be intense or where it seemed to originate from.     Assessment:     Possible neuropathy type symptoms secondary to severe back problems versus inflammatory tendinitis-like symptomatology    Plan:     H&P reviewed with patient as were x-rays. We're going to start her on low-dose gabapentin see how she tolerates it and then after that if that seems to make a difference for her. She will start 300 mg one pill at night and if it does well we will go 22 a day and see her back again in 4 weeks

## 2014-08-05 NOTE — Telephone Encounter (Signed)
I called and informed her that Dr. Paulla Dolly said to stop taking the Gabapentin.  She asked, What did he suggest I do?  I told her he did not say.  She stated, Well can you ask him?  I told her I would.  She said give me a call back and let me know what he says.  I told her I will.

## 2014-08-05 NOTE — Telephone Encounter (Signed)
Attempted to call her back to inform her that Dr. Paulla Dolly said there is nothing else he can prescribe.  Her line was busy.

## 2014-08-05 NOTE — Telephone Encounter (Signed)
I was over there yesterday.  He gave me a prescription for Gabapentin 300 mg.  I took one at 3:15pm and another one at 8:42pm.  I haven't taken any today.  I just feel out of it.  I feel like I need to crawl back in bed.  He wants me to take it 3 times a day.  What should I do?  I told her I would ask Dr. Paulla Dolly and see what he recommends and give you a call back with a response.  She stated could you do that right away.

## 2014-08-11 NOTE — Telephone Encounter (Signed)
I called and informed her that Dr. Paulla Dolly said there is nothing else he can prescribe, they all have the same side effects.  She stated, "Is there not a cream or anything that he can prescribe?"  I told her no, there's not anything else he can prescribe.  She stated, "Do you think if I went to another Podiatrist he can tell me something different?  I told her no, probably not.  She asked what is my diagnosis, he didn't even tell me.  I told her Capsulitis and Neuritis.  She asked, "What is that?"  I told her inflammation basically.  She asked is there anything else I can do for this?  I asked if she could tolerate Ibuprofen or Advil.  She stated yes, I can take those, what's the best?  I told her they are about the same.  I asked her if she had tried taking one Gabapentin a day at bedtime like I had suggested previously.  She stated yes but her times were sporadic.  I suggested she try taking one at bedtime only, no other times.  She stated she would but hates the way it makes her feel when she gets up in the morning.  She stated it makes her feel blah!  She said she'll give it a try.

## 2014-08-15 DIAGNOSIS — Z23 Encounter for immunization: Secondary | ICD-10-CM | POA: Diagnosis not present

## 2014-08-30 ENCOUNTER — Telehealth: Payer: Self-pay | Admitting: *Deleted

## 2014-08-30 NOTE — Telephone Encounter (Signed)
Bystolic samples placed at the front desk for pick up.

## 2014-09-06 ENCOUNTER — Telehealth: Payer: Self-pay | Admitting: *Deleted

## 2014-09-06 NOTE — Telephone Encounter (Signed)
I'm a patient of Dr. Paulla Dolly and I filling out some papers for insurance and I need some information, like your zip code.  That is probably it.  I wish you'd give me a call.  I returned her call and informed [place] [id number].

## 2014-09-07 DIAGNOSIS — L570 Actinic keratosis: Secondary | ICD-10-CM | POA: Diagnosis not present

## 2014-09-07 DIAGNOSIS — L821 Other seborrheic keratosis: Secondary | ICD-10-CM | POA: Diagnosis not present

## 2014-09-07 DIAGNOSIS — L820 Inflamed seborrheic keratosis: Secondary | ICD-10-CM | POA: Diagnosis not present

## 2014-09-15 DIAGNOSIS — E039 Hypothyroidism, unspecified: Secondary | ICD-10-CM | POA: Diagnosis not present

## 2014-09-15 DIAGNOSIS — H43813 Vitreous degeneration, bilateral: Secondary | ICD-10-CM | POA: Diagnosis not present

## 2014-09-15 DIAGNOSIS — M5489 Other dorsalgia: Secondary | ICD-10-CM | POA: Diagnosis not present

## 2014-09-15 DIAGNOSIS — K219 Gastro-esophageal reflux disease without esophagitis: Secondary | ICD-10-CM | POA: Diagnosis not present

## 2014-09-15 DIAGNOSIS — I10 Essential (primary) hypertension: Secondary | ICD-10-CM | POA: Diagnosis not present

## 2014-09-15 DIAGNOSIS — H35373 Puckering of macula, bilateral: Secondary | ICD-10-CM | POA: Diagnosis not present

## 2014-09-15 DIAGNOSIS — K5790 Diverticulosis of intestine, part unspecified, without perforation or abscess without bleeding: Secondary | ICD-10-CM | POA: Diagnosis not present

## 2014-09-15 DIAGNOSIS — E785 Hyperlipidemia, unspecified: Secondary | ICD-10-CM | POA: Diagnosis not present

## 2014-09-15 DIAGNOSIS — H524 Presbyopia: Secondary | ICD-10-CM | POA: Diagnosis not present

## 2014-09-15 DIAGNOSIS — M1990 Unspecified osteoarthritis, unspecified site: Secondary | ICD-10-CM | POA: Diagnosis not present

## 2014-09-15 DIAGNOSIS — Z961 Presence of intraocular lens: Secondary | ICD-10-CM | POA: Diagnosis not present

## 2014-10-04 ENCOUNTER — Telehealth: Payer: Self-pay | Admitting: *Deleted

## 2014-10-04 NOTE — Telephone Encounter (Signed)
Bystolic samples placed at the front desk for pick up.

## 2014-11-05 ENCOUNTER — Telehealth: Payer: Self-pay

## 2014-11-05 NOTE — Telephone Encounter (Signed)
Called to let patient know that I was putting her a 30 day supply of bystolic up front

## 2014-11-30 ENCOUNTER — Telehealth: Payer: Self-pay

## 2014-11-30 ENCOUNTER — Telehealth: Payer: Self-pay | Admitting: Cardiology

## 2014-11-30 NOTE — Telephone Encounter (Signed)
Returned call to patient bystolic 5 mg samples left at front desk of [facility] office.

## 2014-11-30 NOTE — Telephone Encounter (Signed)
Pt would like some samples of Bystlic please.

## 2014-11-30 NOTE — Telephone Encounter (Signed)
Mary Strickland called in wanting samples of Bystolic. Informed pt that I would leave 2 weeks worth up front for her and she stated she would be by "probably" tomorrow to pick samples up.

## 2014-12-01 DIAGNOSIS — H903 Sensorineural hearing loss, bilateral: Secondary | ICD-10-CM | POA: Diagnosis not present

## 2015-02-02 ENCOUNTER — Telehealth: Payer: Self-pay | Admitting: *Deleted

## 2015-02-02 NOTE — Telephone Encounter (Signed)
Pt asked if Dr. Paulla Dolly would write a note saying he wanted her to have the orthotic from the [facility] store.  Dr. Paulla Dolly said no.

## 2015-02-10 ENCOUNTER — Telehealth: Payer: Self-pay

## 2015-02-10 NOTE — Telephone Encounter (Signed)
Patient called to get samples of bystolic placed samples up front

## 2015-03-03 ENCOUNTER — Other Ambulatory Visit: Payer: Self-pay

## 2015-03-03 DIAGNOSIS — Z1231 Encounter for screening mammogram for malignant neoplasm of breast: Secondary | ICD-10-CM

## 2015-04-08 ENCOUNTER — Ambulatory Visit: Payer: PRIVATE HEALTH INSURANCE

## 2015-04-11 ENCOUNTER — Ambulatory Visit
Admission: RE | Admit: 2015-04-11 | Discharge: 2015-04-11 | Disposition: A | Discharge status: Home / Self Care | Payer: Commercial Managed Care - HMO | Source: Ambulatory Visit

## 2015-04-11 DIAGNOSIS — Z1231 Encounter for screening mammogram for malignant neoplasm of breast: Secondary | ICD-10-CM

## 2015-04-21 ENCOUNTER — Telehealth: Payer: Self-pay

## 2015-04-21 NOTE — Telephone Encounter (Signed)
Samples of Bystolic 5 mg 4 boxes provided to patient per request.

## 2015-05-24 ENCOUNTER — Telehealth: Payer: Self-pay | Admitting: Cardiology

## 2015-05-24 NOTE — Telephone Encounter (Signed)
Pt called in for samples, left samples at the front desk for pt to pick up. I informed pt that if she has any other problems, questions or concerns to call the office. Pt verbalized understanding.

## 2015-06-10 ENCOUNTER — Ambulatory Visit (INDEPENDENT_AMBULATORY_CARE_PROVIDER_SITE_OTHER): Payer: Commercial Managed Care - HMO | Admitting: Cardiology

## 2015-06-10 ENCOUNTER — Encounter: Payer: Self-pay | Admitting: Cardiology

## 2015-06-10 VITALS — BP 150/76 | HR 66 | Ht 63.0 in | Wt 159.0 lb

## 2015-06-10 DIAGNOSIS — I10 Essential (primary) hypertension: Secondary | ICD-10-CM | POA: Diagnosis not present

## 2015-06-10 DIAGNOSIS — R002 Palpitations: Secondary | ICD-10-CM | POA: Diagnosis not present

## 2015-06-10 NOTE — Patient Instructions (Signed)
Continue your current therapy  I will see you in one year   

## 2015-06-10 NOTE — Progress Notes (Signed)
   Mary Strickland Date of Birth: [birth year] Medical Record #[id number]  History of Present Illness: Mary Strickland is seen for  followup of palpitations.  She also has a history of hypertension and hyperlipidemia. Prior echocardiogram in 2009 was unremarkable. She currently denies any  dyspnea, or chest pain. She has brief palpitations very rarely. She is still grieving with her husband passing away with Alzhiemers.   Current Outpatient Prescriptions on File Prior to Visit  Medication Sig Dispense Refill  . aspirin 81 MG tablet Take 81 mg by mouth daily.    Marland Kitchen BYSTOLIC 5 MG tablet TAKE 1 TABLET (5 MG TOTAL) BY MOUTH DAILY. 30 tablet 5  . diazepam (VALIUM) 5 MG tablet Take 5 mg by mouth daily. 1/2 -1 daily    . levothyroxine (SYNTHROID, LEVOTHROID) 25 MCG tablet Take 25 mcg by mouth daily.    . temazepam (RESTORIL) 15 MG capsule Take 15 mg by mouth at bedtime as needed.     No current facility-administered medications on file prior to visit.    Allergies  Allergen Reactions  . Percocet [Oxycodone-Acetaminophen]   . Sulfamethoxazole-Trimethoprim     REACTION: SOB, itching,  . Zocor [Simvastatin]     Past Medical History  Diagnosis Date  . Hypertension   . Dyslipidemia   . Palpitations     hx. of  . GERD (gastroesophageal reflux disease)   . Neuroma, Morton's     both feet,surgery done  . Back pain   . Right carotid bruit   . Psychosis   . Idiopathic scoliosis   . Lumbosacral spondylosis   . Mitral valve prolapse   . Anxiety   . Blood loss anemia     Mild postoperative blood loss anemia  . History of diverticulitis of colon   . Ulcer     History of ulcers    Past Surgical History  Procedure Laterality Date  . Total knee arthroplasty  October 2002    right  . Other surgical history      hysterectomy  . Cataracts      both eyes  . Excision morton's neuroma      both feet  . Lumbar fusion    . Other surgical history      left breast biopsy/lymph node    History    Smoking status  . Former Smoker  Smokeless tobacco  . Not on file    History  Alcohol Use: Not on file    Family History  Problem Relation Age of Onset  . Heart disease Father   . Heart attack Father   . Heart disease Mother     Review of Systems: As noted in HPI.  All other systems were reviewed and are negative.  Physical Exam: BP 150/76 mmHg  Pulse 66  Ht 5\' 3"  (1.6 m)  Wt 72.122 kg (159 lb)  BMI 28.17 kg/m2 She is an elderly female in no acute distress. Her HEENT exam is unremarkable. She has no JVD or bruits. Lungs are clear. Cardiac exam reveals a regular rate and rhythm without gallop, murmur, or click. Abdomen is soft and nontender. No masses or bruits. She has no edema. Pedal pulses are good.  LABORATORY DATA: Ecg today shows NSR with normal Ecg. I have personally reviewed and interpreted this study.   Assessment / Plan: 1. HTN controlled. Continue Bystolic.  2. Palpitations-very infrequent  Follow up in one year.

## 2015-08-16 ENCOUNTER — Telehealth: Payer: Self-pay | Admitting: *Deleted

## 2015-08-16 NOTE — Telephone Encounter (Signed)
Bystolic samples placed at the front desk for patient. 

## 2015-09-21 ENCOUNTER — Encounter: Payer: Self-pay | Admitting: Internal Medicine

## 2015-09-26 ENCOUNTER — Telehealth: Payer: Self-pay

## 2015-09-26 NOTE — Telephone Encounter (Signed)
Provided samples for 30 days of Bystolic

## 2015-10-31 ENCOUNTER — Telehealth: Payer: Self-pay | Admitting: Cardiology

## 2015-10-31 NOTE — Telephone Encounter (Signed)
New message    Patient calling the office for samples of medication:   1.  What medication and dosage are you requesting samples for? bystolic  5 mg or  10 mg   2.  Are you currently out of this medication?  Yes

## 2015-11-01 ENCOUNTER — Telehealth: Payer: Self-pay | Admitting: *Deleted

## 2015-11-01 NOTE — Telephone Encounter (Signed)
Needs asst with bystolic 5 mg

## 2015-11-01 NOTE — Telephone Encounter (Signed)
placed samples upfront, called & informed pt.

## 2015-11-04 ENCOUNTER — Other Ambulatory Visit: Payer: Self-pay

## 2015-11-04 MED ORDER — NEBIVOLOL HCL 5 MG PO TABS: 5.0000 mg | ORAL_TABLET | Freq: Every day | ORAL | Status: DC

## 2015-11-04 NOTE — Telephone Encounter (Signed)
New rx for Bystolic sent to [facility]. Will ask them for a Tier reduction.

## 2015-11-07 ENCOUNTER — Telehealth: Payer: Self-pay | Admitting: Cardiology

## 2015-11-07 NOTE — Telephone Encounter (Signed)
Patient would like samples of Bystolic 5mg .

## 2015-11-07 NOTE — Telephone Encounter (Signed)
Returned call to patient Bystolic 5 mg samples left at [facility] office front desk. 

## 2015-12-07 ENCOUNTER — Other Ambulatory Visit: Payer: Self-pay

## 2015-12-07 ENCOUNTER — Telehealth: Payer: Self-pay

## 2015-12-07 NOTE — Telephone Encounter (Signed)
Call from [place] at [facility] requesting Bystolic 5 or 10 mg samples. After discussing with pharmacist Elberta Leatherwood) cutting tablets in half may not provide accurate dosing.  Will give patient 21, Bystolic 5 mg tabs as prescribed take one daily. Samples placed at desk will discuss dose provided with patient.   Georgana Curio MHA RN CCM

## 2015-12-07 NOTE — Telephone Encounter (Signed)
Received a call from patient requesting Bystolic samples.Advised Bystolic 10 mg take 1/2 tablet samples left at front desk of [facility] [facility].

## 2015-12-28 DIAGNOSIS — J42 Unspecified chronic bronchitis: Secondary | ICD-10-CM | POA: Diagnosis not present

## 2015-12-28 DIAGNOSIS — H02409 Unspecified ptosis of unspecified eyelid: Secondary | ICD-10-CM | POA: Diagnosis not present

## 2015-12-28 DIAGNOSIS — Z6829 Body mass index (BMI) 29.0-29.9, adult: Secondary | ICD-10-CM | POA: Diagnosis not present

## 2015-12-28 DIAGNOSIS — D692 Other nonthrombocytopenic purpura: Secondary | ICD-10-CM | POA: Diagnosis not present

## 2015-12-28 DIAGNOSIS — N183 Chronic kidney disease, stage 3 (moderate): Secondary | ICD-10-CM | POA: Diagnosis not present

## 2015-12-28 DIAGNOSIS — R69 Illness, unspecified: Secondary | ICD-10-CM | POA: Diagnosis not present

## 2015-12-28 DIAGNOSIS — R05 Cough: Secondary | ICD-10-CM | POA: Diagnosis not present

## 2015-12-28 DIAGNOSIS — I129 Hypertensive chronic kidney disease with stage 1 through stage 4 chronic kidney disease, or unspecified chronic kidney disease: Secondary | ICD-10-CM | POA: Diagnosis not present

## 2015-12-28 DIAGNOSIS — M5489 Other dorsalgia: Secondary | ICD-10-CM | POA: Diagnosis not present

## 2016-01-23 ENCOUNTER — Ambulatory Visit (INDEPENDENT_AMBULATORY_CARE_PROVIDER_SITE_OTHER): Payer: Medicare HMO

## 2016-01-23 ENCOUNTER — Ambulatory Visit (INDEPENDENT_AMBULATORY_CARE_PROVIDER_SITE_OTHER): Payer: Medicare HMO | Admitting: Podiatry

## 2016-01-23 ENCOUNTER — Encounter: Payer: Self-pay | Admitting: Podiatry

## 2016-01-23 VITALS — BP 161/91 | HR 66 | Resp 16

## 2016-01-23 DIAGNOSIS — G629 Polyneuropathy, unspecified: Secondary | ICD-10-CM | POA: Diagnosis not present

## 2016-01-23 DIAGNOSIS — M79671 Pain in right foot: Secondary | ICD-10-CM | POA: Diagnosis not present

## 2016-01-23 DIAGNOSIS — M722 Plantar fascial fibromatosis: Secondary | ICD-10-CM

## 2016-01-23 DIAGNOSIS — M79672 Pain in left foot: Secondary | ICD-10-CM | POA: Diagnosis not present

## 2016-01-23 MED ORDER — GABAPENTIN 300 MG PO CAPS: 300.0000 mg | ORAL_CAPSULE | Freq: Three times a day (TID) | ORAL | Status: DC

## 2016-01-23 MED ORDER — TRIAMCINOLONE ACETONIDE 10 MG/ML IJ SUSP
10.0000 mg | Freq: Once | INTRAMUSCULAR | Status: AC
  Administered 2016-01-23: 10 mg

## 2016-01-24 NOTE — Progress Notes (Signed)
Subjective:     Patient ID: Mary Strickland, female   DOB: 11-13-1930, 82 y.o.   MRN: [id number]  HPI patient presents stating I'm getting a lot of pain in my left and right heel and also my feet in general and I admit I did not take the medicine I was on before   Review of Systems  All other systems reviewed and are negative.      Objective:   Physical Exam  Constitutional: She is oriented to person, place, and time.  Cardiovascular: Intact distal pulses.   Musculoskeletal: Normal range of motion.  Neurological: She is oriented to person, place, and time.  Skin: Skin is warm and dry.  Nursing note and vitals reviewed.  neurovascular status intact muscle strength adequate with discomfort in the heel region bilateral and also just discomfort in general in both feet with also nerve-like symptomatology     Assessment:      appears to be fasciitis-like symptoms along with probable neuropathy    Plan:      all conditions reviewed x-rays reviewed and injected the plantar heel bilateral 3 mg Kenalog 5 mg Xylocaine and placed on gabapentin 300 mg daily followed by twice a day to try to control symptoms

## 2016-03-05 ENCOUNTER — Ambulatory Visit: Payer: Medicare HMO | Admitting: Podiatry

## 2016-04-05 DIAGNOSIS — R69 Illness, unspecified: Secondary | ICD-10-CM | POA: Diagnosis not present

## 2016-04-19 DIAGNOSIS — E038 Other specified hypothyroidism: Secondary | ICD-10-CM | POA: Diagnosis not present

## 2016-04-19 DIAGNOSIS — N390 Urinary tract infection, site not specified: Secondary | ICD-10-CM | POA: Diagnosis not present

## 2016-04-19 DIAGNOSIS — E784 Other hyperlipidemia: Secondary | ICD-10-CM | POA: Diagnosis not present

## 2016-04-19 DIAGNOSIS — R8290 Unspecified abnormal findings in urine: Secondary | ICD-10-CM | POA: Diagnosis not present

## 2016-04-19 DIAGNOSIS — I10 Essential (primary) hypertension: Secondary | ICD-10-CM | POA: Diagnosis not present

## 2016-04-21 DIAGNOSIS — G629 Polyneuropathy, unspecified: Secondary | ICD-10-CM | POA: Diagnosis not present

## 2016-04-21 DIAGNOSIS — M722 Plantar fascial fibromatosis: Secondary | ICD-10-CM | POA: Diagnosis not present

## 2016-04-23 DIAGNOSIS — I10 Essential (primary) hypertension: Secondary | ICD-10-CM | POA: Diagnosis not present

## 2016-04-23 DIAGNOSIS — K219 Gastro-esophageal reflux disease without esophagitis: Secondary | ICD-10-CM | POA: Diagnosis not present

## 2016-04-23 DIAGNOSIS — N183 Chronic kidney disease, stage 3 (moderate): Secondary | ICD-10-CM | POA: Diagnosis not present

## 2016-04-23 DIAGNOSIS — Z0000 Encounter for general adult medical examination without abnormal findings: Secondary | ICD-10-CM | POA: Diagnosis not present

## 2016-04-23 DIAGNOSIS — I129 Hypertensive chronic kidney disease with stage 1 through stage 4 chronic kidney disease, or unspecified chronic kidney disease: Secondary | ICD-10-CM | POA: Diagnosis not present

## 2016-04-23 DIAGNOSIS — E784 Other hyperlipidemia: Secondary | ICD-10-CM | POA: Diagnosis not present

## 2016-04-23 DIAGNOSIS — K5790 Diverticulosis of intestine, part unspecified, without perforation or abscess without bleeding: Secondary | ICD-10-CM | POA: Diagnosis not present

## 2016-04-23 DIAGNOSIS — R69 Illness, unspecified: Secondary | ICD-10-CM | POA: Diagnosis not present

## 2016-04-23 DIAGNOSIS — D692 Other nonthrombocytopenic purpura: Secondary | ICD-10-CM | POA: Diagnosis not present

## 2016-04-26 DIAGNOSIS — Z1212 Encounter for screening for malignant neoplasm of rectum: Secondary | ICD-10-CM | POA: Diagnosis not present

## 2016-06-06 ENCOUNTER — Telehealth: Payer: Self-pay

## 2016-06-06 NOTE — Telephone Encounter (Signed)
Pt called for samples of 5mg  bystolic. Gave her 1 month supply, left up front for her .

## 2016-06-18 ENCOUNTER — Other Ambulatory Visit: Payer: Self-pay | Admitting: Internal Medicine

## 2016-06-18 DIAGNOSIS — Z1231 Encounter for screening mammogram for malignant neoplasm of breast: Secondary | ICD-10-CM

## 2016-06-19 ENCOUNTER — Ambulatory Visit
Admission: RE | Admit: 2016-06-19 | Discharge: 2016-06-19 | Disposition: A | Discharge status: Home / Self Care | Payer: Medicare HMO | Source: Ambulatory Visit | Attending: Internal Medicine | Admitting: Internal Medicine

## 2016-06-19 DIAGNOSIS — Z1231 Encounter for screening mammogram for malignant neoplasm of breast: Secondary | ICD-10-CM | POA: Diagnosis not present

## 2016-06-22 DIAGNOSIS — Z0000 Encounter for general adult medical examination without abnormal findings: Secondary | ICD-10-CM | POA: Diagnosis not present

## 2016-06-22 DIAGNOSIS — I10 Essential (primary) hypertension: Secondary | ICD-10-CM | POA: Diagnosis not present

## 2016-06-22 DIAGNOSIS — R69 Illness, unspecified: Secondary | ICD-10-CM | POA: Diagnosis not present

## 2016-06-22 DIAGNOSIS — E039 Hypothyroidism, unspecified: Secondary | ICD-10-CM | POA: Diagnosis not present

## 2016-08-14 ENCOUNTER — Telehealth: Payer: Self-pay | Admitting: Cardiology

## 2016-08-14 NOTE — Telephone Encounter (Signed)
Patient calling the office for samples of medication:   1.  What medication and dosage are you requesting samples for?  Bystolic 5mg  or 10mg  she verbalized that she can cut it in half  2.  Are you currently out of this medication? no

## 2016-08-14 NOTE — Telephone Encounter (Signed)
Medication samples have been provided to the patient.  Drug name: Bystolic 5mg   Qty: 28  LOT: [id number]  Exp.Date: 4/19  Samples left at front desk for patient pick-up. Patient notified.  Mary Strickland A Kaiven Vester 2:14 PM 08/14/2016     OV 10/9 with MD [place] at [place] location

## 2016-09-04 DIAGNOSIS — L570 Actinic keratosis: Secondary | ICD-10-CM | POA: Diagnosis not present

## 2016-09-04 DIAGNOSIS — L821 Other seborrheic keratosis: Secondary | ICD-10-CM | POA: Diagnosis not present

## 2016-09-04 DIAGNOSIS — D1801 Hemangioma of skin and subcutaneous tissue: Secondary | ICD-10-CM | POA: Diagnosis not present

## 2016-09-04 DIAGNOSIS — D485 Neoplasm of uncertain behavior of skin: Secondary | ICD-10-CM | POA: Diagnosis not present

## 2016-09-08 NOTE — Progress Notes (Signed)
[name] Date of Birth: [birth year] Medical Record [id number]  History of Present Illness: Mrs. Nellums is seen for  followup of palpitations.  She also has a history of hypertension and hyperlipidemia. Prior echocardiogram in 2009 was unremarkable. She currently denies any  dyspnea, or chest pain. She has brief palpitations very rarely. She remains active and even mows her lawn. She does have chronic back, right hip and knee pain.  Current Outpatient Prescriptions on File Prior to Visit  Medication Sig Dispense Refill  . aspirin 81 MG tablet Take 81 mg by mouth daily.    . diazepam (VALIUM) 5 MG tablet Take 5 mg by mouth daily. 1/2 -1 daily    . FLONASE 50 MCG/ACT nasal spray Inhale 2 sprays into the lungs daily. Use 2 sprays in each nostril daily    . gabapentin (NEURONTIN) 300 MG capsule Take 1 capsule (300 mg total) by mouth 3 (three) times daily. 90 capsule 3  . levothyroxine (SYNTHROID, LEVOTHROID) 25 MCG tablet Take 25 mcg by mouth daily.    . nebivolol (BYSTOLIC) 5 MG tablet Take 1 tablet (5 mg total) by mouth daily. 90 tablet 1  . pantoprazole (PROTONIX) 40 MG tablet Take 40 mg by mouth daily.    . temazepam (RESTORIL) 15 MG capsule Take 15 mg by mouth at bedtime as needed.    Marland Kitchen amitriptyline (ELAVIL) 25 MG tablet Take 1 tablet by mouth daily. Take 1 tab daily     No current facility-administered medications on file prior to visit.     Allergies  Allergen Reactions  . Percocet [Oxycodone-Acetaminophen]   . Sulfamethoxazole-Trimethoprim     REACTION: SOB, itching,  . Zocor [Simvastatin]     Past Medical History:  Diagnosis Date  . Anxiety   . Back pain   . Blood loss anemia    Mild postoperative blood loss anemia  . Dyslipidemia   . GERD (gastroesophageal reflux disease)   . History of diverticulitis of colon   . Hypertension   . Idiopathic scoliosis   . Lumbosacral spondylosis   . Mitral valve prolapse   . Neuroma, Morton's    both feet,surgery done  .  Palpitations    hx. of  . Psychosis   . Right carotid bruit   . Ulcer ([place])    History of ulcers    Past Surgical History:  Procedure Laterality Date  . cataracts     both eyes  . EXCISION MORTON'S NEUROMA     both feet  . LUMBAR FUSION    . OTHER SURGICAL HISTORY     hysterectomy  . OTHER SURGICAL HISTORY     left breast biopsy/lymph node  . TOTAL KNEE ARTHROPLASTY  October 2002   right    History  Smoking Status  . Former Smoker  Smokeless Tobacco  . Not on file    History  Alcohol use Not on file    Family History  Problem Relation Age of Onset  . Heart disease Father   . Heart attack Father   . Heart disease Mother     Review of Systems: As noted in HPI.  All other systems were reviewed and are negative.  Physical Exam: BP 134/62   Pulse 78   Ht 5\' 3"  (1.6 m)   Wt 159 lb (72.1 kg)   BMI 28.17 kg/m  She is an elderly female in no acute distress. Her HEENT exam is unremarkable. She has no JVD or bruits. Lungs are clear. Cardiac  exam reveals a regular rate and rhythm without gallop, murmur, or click. Abdomen is soft and nontender. No masses or bruits. She has no edema. Pedal pulses are good.  LABORATORY DATA: Ecg today shows NSR with low voltage. Rate 78. I have personally reviewed and interpreted this study.  Labs reviewed from 04/19/16: cholesterol 212, triglycerides 137, LDL 147, HDL 38. Creatinine 1.1. Remainder of CMET normal. CBC normal.   Assessment / Plan: 1. HTN controlled. Continue Bystolic.  2. Palpitations-very infrequent   Follow up in one year.

## 2016-09-10 ENCOUNTER — Ambulatory Visit (INDEPENDENT_AMBULATORY_CARE_PROVIDER_SITE_OTHER): Payer: Medicare HMO | Admitting: Cardiology

## 2016-09-10 ENCOUNTER — Encounter: Payer: Self-pay | Admitting: Cardiology

## 2016-09-10 VITALS — BP 134/62 | HR 78 | Ht 63.0 in | Wt 159.0 lb

## 2016-09-10 DIAGNOSIS — I10 Essential (primary) hypertension: Secondary | ICD-10-CM | POA: Diagnosis not present

## 2016-09-10 DIAGNOSIS — R002 Palpitations: Secondary | ICD-10-CM | POA: Diagnosis not present

## 2016-09-10 NOTE — Patient Instructions (Signed)
Continue your current therapy  I will see you in one year   

## 2016-09-14 DIAGNOSIS — R69 Illness, unspecified: Secondary | ICD-10-CM | POA: Diagnosis not present

## 2016-09-19 DIAGNOSIS — I10 Essential (primary) hypertension: Secondary | ICD-10-CM | POA: Diagnosis not present

## 2016-09-19 DIAGNOSIS — M545 Low back pain: Secondary | ICD-10-CM | POA: Diagnosis not present

## 2016-09-19 DIAGNOSIS — Z6829 Body mass index (BMI) 29.0-29.9, adult: Secondary | ICD-10-CM | POA: Diagnosis not present

## 2016-09-19 DIAGNOSIS — M4727 Other spondylosis with radiculopathy, lumbosacral region: Secondary | ICD-10-CM | POA: Diagnosis not present

## 2016-09-19 DIAGNOSIS — M25551 Pain in right hip: Secondary | ICD-10-CM | POA: Diagnosis not present

## 2016-10-03 DIAGNOSIS — D692 Other nonthrombocytopenic purpura: Secondary | ICD-10-CM | POA: Diagnosis not present

## 2016-10-03 DIAGNOSIS — M5489 Other dorsalgia: Secondary | ICD-10-CM | POA: Diagnosis not present

## 2016-10-03 DIAGNOSIS — I129 Hypertensive chronic kidney disease with stage 1 through stage 4 chronic kidney disease, or unspecified chronic kidney disease: Secondary | ICD-10-CM | POA: Diagnosis not present

## 2016-10-03 DIAGNOSIS — N183 Chronic kidney disease, stage 3 (moderate): Secondary | ICD-10-CM | POA: Diagnosis not present

## 2016-10-03 DIAGNOSIS — K219 Gastro-esophageal reflux disease without esophagitis: Secondary | ICD-10-CM | POA: Diagnosis not present

## 2016-10-03 DIAGNOSIS — K5790 Diverticulosis of intestine, part unspecified, without perforation or abscess without bleeding: Secondary | ICD-10-CM | POA: Diagnosis not present

## 2016-10-03 DIAGNOSIS — R69 Illness, unspecified: Secondary | ICD-10-CM | POA: Diagnosis not present

## 2016-10-03 DIAGNOSIS — E784 Other hyperlipidemia: Secondary | ICD-10-CM | POA: Diagnosis not present

## 2016-10-03 DIAGNOSIS — M25551 Pain in right hip: Secondary | ICD-10-CM | POA: Diagnosis not present

## 2016-12-05 ENCOUNTER — Telehealth: Payer: Self-pay | Admitting: Cardiology

## 2016-12-05 NOTE — Telephone Encounter (Signed)
Calling again °

## 2016-12-05 NOTE — Telephone Encounter (Signed)
Patient calling the office for samples of medication:   1.  What medication and dosage are you requesting samples for? Bystolic 5 mg or 10 mg  2.  Are you currently out of this medication? Only 3 pills left

## 2016-12-05 NOTE — Telephone Encounter (Signed)
Pt calling again about Bystolic samples.

## 2016-12-05 NOTE — Telephone Encounter (Signed)
Returned call to patient.Bystolic 10 mg samples left at [facility] office front desk.Take 1/2 tablet to equal 5 mg.

## 2016-12-05 NOTE — Telephone Encounter (Signed)
Returned call to patient no answer.Unable to leave a message no voice mail. ?

## 2016-12-26 DIAGNOSIS — I10 Essential (primary) hypertension: Secondary | ICD-10-CM | POA: Diagnosis not present

## 2016-12-26 DIAGNOSIS — R69 Illness, unspecified: Secondary | ICD-10-CM | POA: Diagnosis not present

## 2016-12-26 DIAGNOSIS — M159 Polyosteoarthritis, unspecified: Secondary | ICD-10-CM | POA: Diagnosis not present

## 2016-12-26 DIAGNOSIS — Z599 Problem related to housing and economic circumstances, unspecified: Secondary | ICD-10-CM | POA: Diagnosis not present

## 2016-12-26 DIAGNOSIS — Z9849 Cataract extraction status, unspecified eye: Secondary | ICD-10-CM | POA: Diagnosis not present

## 2016-12-26 DIAGNOSIS — R000 Tachycardia, unspecified: Secondary | ICD-10-CM | POA: Diagnosis not present

## 2016-12-26 DIAGNOSIS — Z0000 Encounter for general adult medical examination without abnormal findings: Secondary | ICD-10-CM | POA: Diagnosis not present

## 2016-12-26 DIAGNOSIS — Z96651 Presence of right artificial knee joint: Secondary | ICD-10-CM | POA: Diagnosis not present

## 2016-12-26 DIAGNOSIS — E039 Hypothyroidism, unspecified: Secondary | ICD-10-CM | POA: Diagnosis not present

## 2016-12-26 DIAGNOSIS — Z974 Presence of external hearing-aid: Secondary | ICD-10-CM | POA: Diagnosis not present

## 2016-12-26 DIAGNOSIS — Z6830 Body mass index (BMI) 30.0-30.9, adult: Secondary | ICD-10-CM | POA: Diagnosis not present

## 2016-12-26 DIAGNOSIS — Z972 Presence of dental prosthetic device (complete) (partial): Secondary | ICD-10-CM | POA: Diagnosis not present

## 2016-12-26 DIAGNOSIS — H9193 Unspecified hearing loss, bilateral: Secondary | ICD-10-CM | POA: Diagnosis not present

## 2016-12-26 DIAGNOSIS — E669 Obesity, unspecified: Secondary | ICD-10-CM | POA: Diagnosis not present

## 2016-12-26 DIAGNOSIS — Z961 Presence of intraocular lens: Secondary | ICD-10-CM | POA: Diagnosis not present

## 2016-12-27 DIAGNOSIS — H4922 Sixth [abducent] nerve palsy, left eye: Secondary | ICD-10-CM | POA: Diagnosis not present

## 2016-12-27 DIAGNOSIS — H5203 Hypermetropia, bilateral: Secondary | ICD-10-CM | POA: Diagnosis not present

## 2017-01-22 DIAGNOSIS — R69 Illness, unspecified: Secondary | ICD-10-CM | POA: Diagnosis not present

## 2017-01-31 DIAGNOSIS — Z471 Aftercare following joint replacement surgery: Secondary | ICD-10-CM | POA: Diagnosis not present

## 2017-01-31 DIAGNOSIS — Z96651 Presence of right artificial knee joint: Secondary | ICD-10-CM | POA: Diagnosis not present

## 2017-02-19 ENCOUNTER — Telehealth: Payer: Self-pay | Admitting: *Deleted

## 2017-02-19 MED ORDER — GABAPENTIN 300 MG PO CAPS: 300.0000 mg | ORAL_CAPSULE | Freq: Three times a day (TID) | ORAL | 0 refills | Status: DC

## 2017-02-19 NOTE — Telephone Encounter (Signed)
Received fax requesting refill of Gabapentin 300mg . Dr. Paulla Dolly states pt needs an appt for Gabapentin follow up, refill once. Orders faxed to [id number].

## 2017-02-20 ENCOUNTER — Telehealth: Payer: Self-pay | Admitting: Cardiology

## 2017-02-20 NOTE — Telephone Encounter (Signed)
Returned call to patient Bystolic 10 mg samples left at [employer] office front desk.Advised to take 1/2 tablet ( 5 mg ) daily.

## 2017-02-20 NOTE — Telephone Encounter (Signed)
New Message ° °Pt voiced wanting nurse to give her a call. ° °Please f/u °

## 2017-02-20 NOTE — Telephone Encounter (Signed)
Patient is requesting samples of bystolic.

## 2017-03-20 ENCOUNTER — Telehealth: Payer: Self-pay | Admitting: *Deleted

## 2017-03-20 MED ORDER — GABAPENTIN 300 MG PO CAPS: 300.0000 mg | ORAL_CAPSULE | Freq: Three times a day (TID) | ORAL | 0 refills | Status: DC

## 2017-03-20 NOTE — Telephone Encounter (Signed)
Received refill request for Gabapentin 300mg . Dr. Paulla Dolly states pt needs an appt prescribe #45.

## 2017-04-02 DIAGNOSIS — R69 Illness, unspecified: Secondary | ICD-10-CM | POA: Diagnosis not present

## 2017-04-17 DIAGNOSIS — I10 Essential (primary) hypertension: Secondary | ICD-10-CM | POA: Diagnosis not present

## 2017-04-17 DIAGNOSIS — N183 Chronic kidney disease, stage 3 (moderate): Secondary | ICD-10-CM | POA: Diagnosis not present

## 2017-04-17 DIAGNOSIS — R358 Other polyuria: Secondary | ICD-10-CM | POA: Diagnosis not present

## 2017-04-17 DIAGNOSIS — R8290 Unspecified abnormal findings in urine: Secondary | ICD-10-CM | POA: Diagnosis not present

## 2017-04-17 DIAGNOSIS — E784 Other hyperlipidemia: Secondary | ICD-10-CM | POA: Diagnosis not present

## 2017-04-17 DIAGNOSIS — E038 Other specified hypothyroidism: Secondary | ICD-10-CM | POA: Diagnosis not present

## 2017-04-24 DIAGNOSIS — R69 Illness, unspecified: Secondary | ICD-10-CM | POA: Diagnosis not present

## 2017-04-24 DIAGNOSIS — M25551 Pain in right hip: Secondary | ICD-10-CM | POA: Diagnosis not present

## 2017-04-24 DIAGNOSIS — M5489 Other dorsalgia: Secondary | ICD-10-CM | POA: Diagnosis not present

## 2017-04-24 DIAGNOSIS — N183 Chronic kidney disease, stage 3 (moderate): Secondary | ICD-10-CM | POA: Diagnosis not present

## 2017-04-24 DIAGNOSIS — Z0000 Encounter for general adult medical examination without abnormal findings: Secondary | ICD-10-CM | POA: Diagnosis not present

## 2017-04-24 DIAGNOSIS — H02409 Unspecified ptosis of unspecified eyelid: Secondary | ICD-10-CM | POA: Diagnosis not present

## 2017-04-24 DIAGNOSIS — I129 Hypertensive chronic kidney disease with stage 1 through stage 4 chronic kidney disease, or unspecified chronic kidney disease: Secondary | ICD-10-CM | POA: Diagnosis not present

## 2017-04-24 DIAGNOSIS — K5790 Diverticulosis of intestine, part unspecified, without perforation or abscess without bleeding: Secondary | ICD-10-CM | POA: Diagnosis not present

## 2017-04-24 DIAGNOSIS — D692 Other nonthrombocytopenic purpura: Secondary | ICD-10-CM | POA: Diagnosis not present

## 2017-04-25 DIAGNOSIS — Z1212 Encounter for screening for malignant neoplasm of rectum: Secondary | ICD-10-CM | POA: Diagnosis not present

## 2017-05-09 ENCOUNTER — Other Ambulatory Visit: Payer: Self-pay | Admitting: Internal Medicine

## 2017-05-09 DIAGNOSIS — Z1231 Encounter for screening mammogram for malignant neoplasm of breast: Secondary | ICD-10-CM

## 2017-06-10 ENCOUNTER — Telehealth: Payer: Self-pay | Admitting: Cardiology

## 2017-06-10 NOTE — Telephone Encounter (Signed)
New message    Patient calling the office for samples of medication:   1.  What medication and dosage are you requesting samples for? nebivolol (BYSTOLIC) 5 MG tablet  2.  Are you currently out of this medication? Yes    

## 2017-06-10 NOTE — Telephone Encounter (Signed)
Patient called and informed that samples will be in the front for her.  Medication Samples have been provided to the patient:  Drug name: Bystolic       Strength: 5 mg         Qty: 4 boxes   LOT: [id number]   Exp.Date: 10/20

## 2017-06-13 ENCOUNTER — Ambulatory Visit (INDEPENDENT_AMBULATORY_CARE_PROVIDER_SITE_OTHER): Payer: Medicare HMO

## 2017-06-13 ENCOUNTER — Ambulatory Visit (INDEPENDENT_AMBULATORY_CARE_PROVIDER_SITE_OTHER): Payer: Medicare HMO | Admitting: Podiatry

## 2017-06-13 DIAGNOSIS — M779 Enthesopathy, unspecified: Secondary | ICD-10-CM | POA: Diagnosis not present

## 2017-06-13 DIAGNOSIS — G629 Polyneuropathy, unspecified: Secondary | ICD-10-CM | POA: Diagnosis not present

## 2017-06-13 DIAGNOSIS — M79671 Pain in right foot: Secondary | ICD-10-CM

## 2017-06-13 DIAGNOSIS — M79672 Pain in left foot: Principal | ICD-10-CM

## 2017-06-13 MED ORDER — TRIAMCINOLONE ACETONIDE 10 MG/ML IJ SUSP
10.0000 mg | Freq: Once | INTRAMUSCULAR | Status: AC
  Administered 2017-06-13: 10 mg

## 2017-06-13 MED ORDER — NONFORMULARY OR COMPOUNDED ITEM: 3 refills | Status: DC

## 2017-06-13 NOTE — Progress Notes (Signed)
Subjective:    Patient ID: Mary Strickland, female   DOB: 83 y.o.   MRN: [id number]   HPI patient presents with pain in the ankle region bilateral and states she has trouble taking the gabapentin and also complains of neurological like neuropathic pain    ROS      Objective:  Physical Exam patient's found to have quite a bit of discomfort in the sinus tarsi bilateral with inflammation and does have moderate neuropathic pain with diminishment of sharp [place] vibratory. Range of motion loss was noted bilateral     Assessment:    Inflammatory capsulitis with secondary neuropathic     Plan:    H&P and both conditions discussed. We have tried gabapentin and I offered her lower dose but she does not want currently and today I injected the sinus tarsi bilateral 3 mg Kenalog 5 mill grams Xylocaine which was tolerated well

## 2017-06-26 ENCOUNTER — Other Ambulatory Visit: Payer: Self-pay | Admitting: Internal Medicine

## 2017-06-26 ENCOUNTER — Ambulatory Visit
Admission: RE | Admit: 2017-06-26 | Discharge: 2017-06-26 | Disposition: A | Discharge status: Home / Self Care | Payer: Medicare HMO | Source: Ambulatory Visit | Attending: Internal Medicine | Admitting: Internal Medicine

## 2017-06-26 ENCOUNTER — Ambulatory Visit: Payer: Medicare HMO

## 2017-06-26 DIAGNOSIS — I10 Essential (primary) hypertension: Secondary | ICD-10-CM | POA: Diagnosis not present

## 2017-06-26 DIAGNOSIS — Z6827 Body mass index (BMI) 27.0-27.9, adult: Secondary | ICD-10-CM | POA: Diagnosis not present

## 2017-06-26 DIAGNOSIS — Z0000 Encounter for general adult medical examination without abnormal findings: Secondary | ICD-10-CM

## 2017-06-26 DIAGNOSIS — M961 Postlaminectomy syndrome, not elsewhere classified: Secondary | ICD-10-CM | POA: Diagnosis not present

## 2017-06-26 DIAGNOSIS — M25551 Pain in right hip: Secondary | ICD-10-CM | POA: Diagnosis not present

## 2017-06-26 DIAGNOSIS — Z1231 Encounter for screening mammogram for malignant neoplasm of breast: Secondary | ICD-10-CM | POA: Diagnosis not present

## 2017-06-26 DIAGNOSIS — M4727 Other spondylosis with radiculopathy, lumbosacral region: Secondary | ICD-10-CM | POA: Diagnosis not present

## 2017-07-15 ENCOUNTER — Other Ambulatory Visit: Payer: Self-pay | Admitting: Cardiology

## 2017-07-15 NOTE — Telephone Encounter (Signed)
Samples placed up front. Patient directly notified.

## 2017-07-15 NOTE — Telephone Encounter (Signed)
Follow up    Pt is asking for a call back about samples of Bystolic.

## 2017-07-15 NOTE — Telephone Encounter (Signed)
Patient calling the office for samples of medication: ° ° °1.  What medication and dosage are you requesting samples for? Bystolic 5mg ° °2.  Are you currently out of this medication? Yes ° ° °

## 2017-08-07 ENCOUNTER — Telehealth: Payer: Self-pay | Admitting: Podiatry

## 2017-08-07 NOTE — Telephone Encounter (Signed)
Pt called saying the compound medication from [place] is not helping at all. I'm still in pain. I want to know what is wrong with me. He was only with me for 5 minutes and gave me two injections and the Rx for this. I just want to know what the diagnosis is that he thinks I have (i.e. Neuropathy etc.)

## 2017-08-12 MED ORDER — GABAPENTIN 100 MG PO CAPS: 100.0000 mg | ORAL_CAPSULE | Freq: Every day | ORAL | 1 refills | Status: DC

## 2017-08-12 MED ORDER — GABAPENTIN 100 MG PO CAPS: 100.0000 mg | ORAL_CAPSULE | Freq: Three times a day (TID) | ORAL | 1 refills | Status: DC

## 2017-08-12 NOTE — Telephone Encounter (Addendum)
Dr. Paulla Dolly states he does feel she has neurology and would like her to take a low dose Gabapentin 100mg  #90 one tablet hs +1refill. I informed pt of Dr. Mellody Drown order.Pt states she wanted to tell me something and she would probably still end up taking the Gabapentin, but Dr. Paulla Dolly had a while back had ordered 300mg  Gabapentin 3 times a day and she felt like a drunk. I told pt she would roughly be taking 1/9th of the original dose and would be taking it at bedtime. Pt states she thought she might try that. I asked pt's pharmacy and she said she used a Holiday representative with [facility]. I did not see that in our pharmacy system so I told pt I would call [employer] and get the prescribing information and call her again. Signa Kell gave the information to set up for pt's mail order delivery. I informed pt the Gabapentin would be coming by [facility] delivery.

## 2017-08-12 NOTE — Addendum Note (Signed)
Addended by: Harriett Sine D on: 08/12/2017 10:05 AM   Modules accepted: Orders

## 2017-08-19 ENCOUNTER — Telehealth: Payer: Self-pay | Admitting: Podiatry

## 2017-08-19 NOTE — Telephone Encounter (Signed)
Pt called to confirm gabapentin was sent to pharmacy. I told the pt it was sent and received by [place], [place] on 10 September at 10:01 am. Pt states she has not received any medication and wants to speak to [place], RN to confirm she gave them all the information they needed.

## 2017-08-20 NOTE — Telephone Encounter (Signed)
Pt states she received a call from someone stating they were from [place], wanting to know her DOB, and she told them [facility] Falling should have that and did not tell them. She did call [facility] and they said the rx was on the way.

## 2017-08-21 ENCOUNTER — Telehealth: Payer: Self-pay | Admitting: Cardiology

## 2017-08-21 NOTE — Telephone Encounter (Signed)
Patient calling the office for samples of medication: ° ° °1.  What medication and dosage are you requesting samples for? Bystolic ° °2.  Are you currently out of this medication?  ° ° ° °

## 2017-08-21 NOTE — Telephone Encounter (Signed)
Returned call to patient Bystolic 5 mg samples left at [facility] office front desk. 

## 2017-08-22 ENCOUNTER — Telehealth: Payer: Self-pay | Admitting: Podiatry

## 2017-08-22 NOTE — Telephone Encounter (Signed)
I need to talk to [facility]. I'm calling about the gabapentin tablets. So please call me Marcy Siren as soon as you can at [phone]. Thank you and I'll be looking for your call. Thank you and good bye.

## 2017-09-04 DIAGNOSIS — R69 Illness, unspecified: Secondary | ICD-10-CM | POA: Diagnosis not present

## 2017-09-09 ENCOUNTER — Encounter: Payer: Self-pay | Admitting: Cardiology

## 2017-09-13 NOTE — Progress Notes (Deleted)
Mary Strickland Date of Birth: 01-21-1930 Medical Record #[id number]  History of Present Illness: Mary Strickland is seen for  followup of palpitations.  She also has a history of hypertension and hyperlipidemia. Prior echocardiogram in 2009 was unremarkable. She currently denies any  dyspnea, or chest pain. She has brief palpitations very rarely. She remains active and even mows her lawn. She does have chronic back, right hip and knee pain.  Current Outpatient Prescriptions on File Prior to Visit  Medication Sig Dispense Refill  . amitriptyline (ELAVIL) 25 MG tablet Take 1 tablet by mouth daily. Take 1 tab daily    . aspirin 81 MG tablet Take 81 mg by mouth daily.    . diazepam (VALIUM) 5 MG tablet Take 5 mg by mouth daily. 1/2 -1 daily    . FLONASE 50 MCG/ACT nasal spray Inhale 2 sprays into the lungs daily. Use 2 sprays in each nostril daily    . gabapentin (NEURONTIN) 100 MG capsule Take 1 capsule (100 mg total) by mouth at bedtime. 90 capsule 1  . gabapentin (NEURONTIN) 300 MG capsule Take 1 capsule (300 mg total) by mouth 3 (three) times daily. (Patient not taking: Reported on 06/13/2017) 90 capsule 0  . levothyroxine (SYNTHROID, LEVOTHROID) 25 MCG tablet Take 25 mcg by mouth daily.    . nebivolol (BYSTOLIC) 5 MG tablet Take 1 tablet (5 mg total) by mouth daily. 90 tablet 1  . NONFORMULARY OR COMPOUNDED ITEM [facility]: Peripheral Neuropathy cream - Bupivacaine 1%, Doxepin 3%, Gabapentin 6%, Pentoxifylline 3%, Topiramate 1%, apply 1-2 grams to affected area 3-4 times a day. 180 each 3  . pantoprazole (PROTONIX) 40 MG tablet Take 40 mg by mouth daily.    . temazepam (RESTORIL) 15 MG capsule Take 15 mg by mouth at bedtime as needed.     No current facility-administered medications on file prior to visit.     Allergies  Allergen Reactions  . Percocet [Oxycodone-Acetaminophen]   . Sulfamethoxazole-Trimethoprim     REACTION: SOB, itching,  . Zocor [Simvastatin]     Past Medical  History:  Diagnosis Date  . Anxiety   . Back pain   . Blood loss anemia    Mild postoperative blood loss anemia  . Dyslipidemia   . GERD (gastroesophageal reflux disease)   . History of diverticulitis of colon   . Hypertension   . Idiopathic scoliosis   . Lumbosacral spondylosis   . Mitral valve prolapse   . Neuroma, Morton's    both feet,surgery done  . Palpitations    hx. of  . Psychosis ([place])   . Right carotid bruit   . Ulcer    History of ulcers    Past Surgical History:  Procedure Laterality Date  . cataracts     both eyes  . EXCISION MORTON'S NEUROMA     both feet  . LUMBAR FUSION    . OTHER SURGICAL HISTORY     hysterectomy  . OTHER SURGICAL HISTORY     left breast biopsy/lymph node  . TOTAL KNEE ARTHROPLASTY  October 2002   right    History  Smoking Status  . Former Smoker  Smokeless Tobacco  . Never Used    History  Alcohol Use No    Family History  Problem Relation Age of Onset  . Heart disease Father   . Heart attack Father   . Heart disease Mother   . Breast cancer Neg Hx     Review of Systems: As  noted in HPI.  All other systems were reviewed and are negative.  Physical Exam: There were no vitals taken for this visit. She is an elderly female in no acute distress. Her HEENT exam is unremarkable. She has no JVD or bruits. Lungs are clear. Cardiac exam reveals a regular rate and rhythm without gallop, murmur, or click. Abdomen is soft and nontender. No masses or bruits. She has no edema. Pedal pulses are good.  LABORATORY DATA: Ecg today shows NSR with low voltage. Rate 78. I have personally reviewed and interpreted this study.  Labs reviewed from 04/19/16: cholesterol 212, triglycerides 137, LDL 147, HDL 38. Creatinine 1.1. Remainder of CMET normal. CBC normal. Dated 04/17/17: cholesterol 208, triglycerides 240, HDL 35, LDL 125. Chemistries, CBC, TSH normal.   Assessment / Plan: 1. HTN controlled. Continue Bystolic.  2.  Palpitations-very infrequent   Follow up in one year.

## 2017-09-17 ENCOUNTER — Ambulatory Visit: Payer: Medicare HMO | Admitting: Cardiology

## 2017-10-07 ENCOUNTER — Telehealth: Payer: Self-pay | Admitting: Podiatry

## 2017-10-07 NOTE — Telephone Encounter (Signed)
I'm a patient of Dr. Mellody Drown and I need to speak to someone about the gabapentin medication. I would appreciate a call today if possible. Thank you. Bye bye.

## 2017-10-08 NOTE — Telephone Encounter (Signed)
Pt states she can't take the gabapentin, she'd doesn't drink, but would just as soon drink Mary Strickland and sit in the corner. Pt states she felt like Dr. Paulla Dolly rushed in the room and out without listening to her concerns about the pain in her feet and legs, and it hurt her feelings and the cream he ordered didn't help. I offered to get pt an appt with Dr. Cannon Kettle, who would take time to help. Pt states she has an appt with Mary Strickland. I told pt that if after she saw Mary Strickland and she still wanted to have the pain in her legs evaluated, to call and I would get her in to see Dr. Cannon Kettle.

## 2017-10-14 DIAGNOSIS — D485 Neoplasm of uncertain behavior of skin: Secondary | ICD-10-CM | POA: Diagnosis not present

## 2017-10-14 DIAGNOSIS — L820 Inflamed seborrheic keratosis: Secondary | ICD-10-CM | POA: Diagnosis not present

## 2017-10-14 DIAGNOSIS — D1801 Hemangioma of skin and subcutaneous tissue: Secondary | ICD-10-CM | POA: Diagnosis not present

## 2017-10-14 DIAGNOSIS — L821 Other seborrheic keratosis: Secondary | ICD-10-CM | POA: Diagnosis not present

## 2017-10-30 DIAGNOSIS — M5489 Other dorsalgia: Secondary | ICD-10-CM | POA: Diagnosis not present

## 2017-10-30 DIAGNOSIS — N183 Chronic kidney disease, stage 3 (moderate): Secondary | ICD-10-CM | POA: Diagnosis not present

## 2017-10-30 DIAGNOSIS — I129 Hypertensive chronic kidney disease with stage 1 through stage 4 chronic kidney disease, or unspecified chronic kidney disease: Secondary | ICD-10-CM | POA: Diagnosis not present

## 2017-10-30 DIAGNOSIS — D692 Other nonthrombocytopenic purpura: Secondary | ICD-10-CM | POA: Diagnosis not present

## 2017-10-30 DIAGNOSIS — M25551 Pain in right hip: Secondary | ICD-10-CM | POA: Diagnosis not present

## 2017-10-30 DIAGNOSIS — R69 Illness, unspecified: Secondary | ICD-10-CM | POA: Diagnosis not present

## 2017-10-30 DIAGNOSIS — E038 Other specified hypothyroidism: Secondary | ICD-10-CM | POA: Diagnosis not present

## 2017-10-30 DIAGNOSIS — G629 Polyneuropathy, unspecified: Secondary | ICD-10-CM | POA: Diagnosis not present

## 2017-10-30 DIAGNOSIS — E7849 Other hyperlipidemia: Secondary | ICD-10-CM | POA: Diagnosis not present

## 2017-10-30 DIAGNOSIS — I10 Essential (primary) hypertension: Secondary | ICD-10-CM | POA: Diagnosis not present

## 2017-11-21 ENCOUNTER — Telehealth: Payer: Self-pay | Admitting: Cardiology

## 2017-11-21 NOTE — Telephone Encounter (Signed)
Patient calling the office for samples of medication:   1.  What medication and dosage are you requesting samples for? Bystolic 5 mg or 10 mg   2.  Are you currently out of this medication? Almost

## 2017-11-21 NOTE — Telephone Encounter (Signed)
Patient has been made aware that samples are available.  Medication Samples have been provided to the patient.  Drug name: Bystolic       Strength: 5 mg         Qty: 4 boxes  LOT: [id number]  Exp.Date: 3/21

## 2017-12-23 NOTE — Progress Notes (Signed)
Mary Strickland Date of Birth: 1929/12/30 Medical Record #[id number]  History of Present Illness: Mary Strickland is seen for  followup of palpitations.  She was last seen in October 2017.  She also has a history of hypertension and hyperlipidemia. Prior echocardiogram in 2009 was unremarkable.   On follow up today she has had a good year. She currently denies any  dyspnea, or chest pain. She has brief palpitations very rarely. She remains active.  She does have chronic back, right hip and knee pain. She has neuropathy in her feet.   Current Outpatient Medications on File Prior to Visit  Medication Sig Dispense Refill  . aspirin 81 MG tablet Take 81 mg by mouth daily.    . diazepam (VALIUM) 5 MG tablet Take 5 mg by mouth daily. 1/2 -1 daily    . FLONASE 50 MCG/ACT nasal spray Inhale 2 sprays into the lungs daily. Use 2 sprays in each nostril daily    . levothyroxine (SYNTHROID, LEVOTHROID) 25 MCG tablet Take 25 mcg by mouth daily.    . nebivolol (BYSTOLIC) 5 MG tablet Take 1 tablet (5 mg total) by mouth daily. 90 tablet 1  . NONFORMULARY OR COMPOUNDED ITEM [facility]: Peripheral Neuropathy cream - Bupivacaine 1%, Doxepin 3%, Gabapentin 6%, Pentoxifylline 3%, Topiramate 1%, apply 1-2 grams to affected area 3-4 times a day. 180 each 3  . pantoprazole (PROTONIX) 40 MG tablet Take 40 mg by mouth as needed.      No current facility-administered medications on file prior to visit.     Allergies  Allergen Reactions  . Percocet [Oxycodone-Acetaminophen]   . Sulfamethoxazole-Trimethoprim     REACTION: SOB, itching,  . Zocor [Simvastatin]     Past Medical History:  Diagnosis Date  . Anxiety   . Back pain   . Blood loss anemia    Mild postoperative blood loss anemia  . Dyslipidemia   . GERD (gastroesophageal reflux disease)   . History of diverticulitis of colon   . Hypertension   . Idiopathic scoliosis   . Lumbosacral spondylosis   . Mitral valve prolapse   . Neuroma, Morton's    both feet,surgery done  . Palpitations    hx. of  . Psychosis ([place])   . Right carotid bruit   . Ulcer    History of ulcers    Past Surgical History:  Procedure Laterality Date  . cataracts     both eyes  . EXCISION MORTON'S NEUROMA     both feet  . LUMBAR FUSION    . OTHER SURGICAL HISTORY     hysterectomy  . OTHER SURGICAL HISTORY     left breast biopsy/lymph node  . TOTAL KNEE ARTHROPLASTY  October 2002   right    Social History   Tobacco Use  Smoking Status Former Smoker  Smokeless Tobacco Never Used    Social History   Substance and Sexual Activity  Alcohol Use No    Family History  Problem Relation Age of Onset  . Heart disease Father   . Heart attack Father   . Heart disease Mother   . Breast cancer Neg Hx     Review of Systems: As noted in HPI.  All other systems were reviewed and are negative.  Physical Exam: BP 120/64 (BP Location: Left Arm, Patient Position: Sitting, Cuff Size: Normal)   Pulse 63   Ht 5\' 3"  (1.6 m)   Wt 153 lb (69.4 kg)   BMI 27.10 kg/m  GENERAL:  Well appearing, elderly WF in NAD HEENT:  PERRL, EOMI, sclera are clear. Oropharynx is clear. NECK:  No jugular venous distention, carotid upstroke brisk and symmetric, no bruits, no thyromegaly or adenopathy LUNGS:  Clear to auscultation bilaterally CHEST:  Unremarkable HEART:  RRR,  PMI not displaced or sustained,S1 and S2 within normal limits, no S3, no S4: no clicks, no rubs, no murmurs ABD:  Soft, nontender. BS +, no masses or bruits. No hepatomegaly, no splenomegaly EXT:  2 + pulses throughout, no edema, no cyanosis no clubbing SKIN:  Warm and dry.  No rashes NEURO:  Alert and oriented x 3. Cranial nerves II through XII intact. PSYCH:  Cognitively intact    LABORATORY DATA: Ecg today shows NSR with first degree AV block.. Rate 63. I have personally reviewed and interpreted this study.  Labs reviewed from 04/19/16: cholesterol 212, triglycerides 137, LDL 147, HDL 38.  Creatinine 1.1. Remainder of CMET normal. CBC normal. Dated 04/17/17: Cholesterol 208, triglycerides 240, HDL 35, LDL 125. Creatinine 1.1. Otherwise CBC, chemistries and TSH normal.   Assessment / Plan: 1. HTN controlled. Continue Bystolic.  2. Palpitations-very infrequent and benign.   Follow up in one year.

## 2017-12-26 ENCOUNTER — Telehealth: Payer: Self-pay | Admitting: Cardiology

## 2017-12-26 NOTE — Telephone Encounter (Signed)
Returned call to patient Bystolic 5 mg samples left at [facility] office front desk. 

## 2017-12-26 NOTE — Telephone Encounter (Signed)
Patient calling the office for samples of medication: ° ° °1.  What medication and dosage are you requesting samples for? Bystolic 5mg ° °2.  Are you currently out of this medication? Yes ° ° °

## 2017-12-27 ENCOUNTER — Encounter: Payer: Self-pay | Admitting: Cardiology

## 2017-12-27 ENCOUNTER — Ambulatory Visit (INDEPENDENT_AMBULATORY_CARE_PROVIDER_SITE_OTHER): Payer: Medicare HMO | Admitting: Cardiology

## 2017-12-27 VITALS — BP 120/64 | HR 63 | Ht 63.0 in | Wt 153.0 lb

## 2017-12-27 DIAGNOSIS — I10 Essential (primary) hypertension: Secondary | ICD-10-CM | POA: Diagnosis not present

## 2017-12-27 DIAGNOSIS — R002 Palpitations: Secondary | ICD-10-CM | POA: Diagnosis not present

## 2017-12-27 NOTE — Patient Instructions (Signed)
Continue your current therapy  I will see you one year.   

## 2018-01-06 DIAGNOSIS — I10 Essential (primary) hypertension: Secondary | ICD-10-CM | POA: Diagnosis not present

## 2018-01-06 DIAGNOSIS — Z6827 Body mass index (BMI) 27.0-27.9, adult: Secondary | ICD-10-CM | POA: Diagnosis not present

## 2018-01-06 DIAGNOSIS — M545 Low back pain: Secondary | ICD-10-CM | POA: Diagnosis not present

## 2018-01-06 DIAGNOSIS — G629 Polyneuropathy, unspecified: Secondary | ICD-10-CM | POA: Diagnosis not present

## 2018-01-06 DIAGNOSIS — M961 Postlaminectomy syndrome, not elsewhere classified: Secondary | ICD-10-CM | POA: Diagnosis not present

## 2018-01-06 DIAGNOSIS — M5416 Radiculopathy, lumbar region: Secondary | ICD-10-CM | POA: Diagnosis not present

## 2018-01-06 DIAGNOSIS — M4727 Other spondylosis with radiculopathy, lumbosacral region: Secondary | ICD-10-CM | POA: Diagnosis not present

## 2018-01-16 DIAGNOSIS — H903 Sensorineural hearing loss, bilateral: Secondary | ICD-10-CM | POA: Diagnosis not present

## 2018-02-10 ENCOUNTER — Telehealth: Payer: Self-pay | Admitting: Cardiology

## 2018-02-10 MED ORDER — NEBIVOLOL HCL 10 MG PO TABS: 5.0000 mg | ORAL_TABLET | Freq: Every day | ORAL | 0 refills | Status: DC

## 2018-02-10 NOTE — Telephone Encounter (Signed)
SAMPLES AVAILABLE FOR PICK UP - BYSTOLIC 10 MG X4

## 2018-02-10 NOTE — Telephone Encounter (Signed)
New Message   Patient calling the office for samples of medication:   1.  What medication and dosage are you requesting samples for? nebivolol (BYSTOLIC) 5 MG tablet 2.  Are you currently out of this medication?  About a week worth left  Patient is requesting samples for Bystolic 5mg  she says she will take the 10mg  because she knows how to cut them. She is going out of town for about a month.

## 2018-04-16 ENCOUNTER — Telehealth: Payer: Self-pay | Admitting: Cardiology

## 2018-04-16 MED ORDER — NEBIVOLOL HCL 5 MG PO TABS: 5.0000 mg | ORAL_TABLET | Freq: Every day | ORAL | 0 refills | Status: DC

## 2018-04-16 NOTE — Telephone Encounter (Signed)
Patient calling the office for samples of medication:    1.  What medication and dosage are you requesting samples for?Bystolic 5 but she know how to cut a 10 into  2.  Are you currently out of this medication? Has a few more left and can't afford the medication

## 2018-05-07 DIAGNOSIS — R82998 Other abnormal findings in urine: Secondary | ICD-10-CM | POA: Diagnosis not present

## 2018-05-07 DIAGNOSIS — I10 Essential (primary) hypertension: Secondary | ICD-10-CM | POA: Diagnosis not present

## 2018-05-07 DIAGNOSIS — E038 Other specified hypothyroidism: Secondary | ICD-10-CM | POA: Diagnosis not present

## 2018-05-14 DIAGNOSIS — I129 Hypertensive chronic kidney disease with stage 1 through stage 4 chronic kidney disease, or unspecified chronic kidney disease: Secondary | ICD-10-CM | POA: Diagnosis not present

## 2018-05-14 DIAGNOSIS — R69 Illness, unspecified: Secondary | ICD-10-CM | POA: Diagnosis not present

## 2018-05-14 DIAGNOSIS — G6289 Other specified polyneuropathies: Secondary | ICD-10-CM | POA: Diagnosis not present

## 2018-05-14 DIAGNOSIS — K5790 Diverticulosis of intestine, part unspecified, without perforation or abscess without bleeding: Secondary | ICD-10-CM | POA: Diagnosis not present

## 2018-05-14 DIAGNOSIS — Z0000 Encounter for general adult medical examination without abnormal findings: Secondary | ICD-10-CM | POA: Diagnosis not present

## 2018-05-14 DIAGNOSIS — N183 Chronic kidney disease, stage 3 (moderate): Secondary | ICD-10-CM | POA: Diagnosis not present

## 2018-05-14 DIAGNOSIS — I10 Essential (primary) hypertension: Secondary | ICD-10-CM | POA: Diagnosis not present

## 2018-05-14 DIAGNOSIS — M5489 Other dorsalgia: Secondary | ICD-10-CM | POA: Diagnosis not present

## 2018-05-14 DIAGNOSIS — M25551 Pain in right hip: Secondary | ICD-10-CM | POA: Diagnosis not present

## 2018-05-15 DIAGNOSIS — Z1212 Encounter for screening for malignant neoplasm of rectum: Secondary | ICD-10-CM | POA: Diagnosis not present

## 2018-05-16 DIAGNOSIS — H35033 Hypertensive retinopathy, bilateral: Secondary | ICD-10-CM | POA: Diagnosis not present

## 2018-05-16 DIAGNOSIS — H35373 Puckering of macula, bilateral: Secondary | ICD-10-CM | POA: Diagnosis not present

## 2018-05-16 DIAGNOSIS — I7090 Unspecified atherosclerosis: Secondary | ICD-10-CM | POA: Diagnosis not present

## 2018-05-16 DIAGNOSIS — Z961 Presence of intraocular lens: Secondary | ICD-10-CM | POA: Diagnosis not present

## 2018-05-21 ENCOUNTER — Telehealth: Payer: Self-pay | Admitting: Cardiology

## 2018-05-21 NOTE — Telephone Encounter (Signed)
.  Patient calling the office for samples of medication:   1.  What medication and dosage are you requesting samples for? Bystolic 5 mg or 10 mg  2.  Are you currently out of this medication?  Need some ,because she is going out of town,sister fell and broke her arm,so she will be there awhile

## 2018-05-21 NOTE — Telephone Encounter (Signed)
Medication samples have been provided to the patient.  Drug name: Bystolic 5 mg  Qty: 28  LOT: [id number]  Exp.Date: 07/21  Samples left at front desk for patient pick-up. Patient notified.

## 2018-06-20 ENCOUNTER — Other Ambulatory Visit: Payer: Self-pay | Admitting: Cardiology

## 2018-06-20 MED ORDER — NEBIVOLOL HCL 5 MG PO TABS: 5.0000 mg | ORAL_TABLET | Freq: Every day | ORAL | 0 refills | Status: DC

## 2018-06-20 NOTE — Telephone Encounter (Signed)
Patient calling the office for samples of medication:   1.  What medication and dosage are you requesting samples for?Bystolic 5mg   2.  Are you currently out of this medication? almost

## 2018-06-20 NOTE — Telephone Encounter (Signed)
Rx request sent to pharmacy.  

## 2018-06-20 NOTE — Telephone Encounter (Signed)
Patient aware.

## 2018-06-20 NOTE — Telephone Encounter (Signed)
Bystolic 5 mg samples at front for pick up  [id number] EXP 7/21 #3

## 2018-07-07 DIAGNOSIS — M545 Low back pain: Secondary | ICD-10-CM | POA: Diagnosis not present

## 2018-07-07 DIAGNOSIS — M961 Postlaminectomy syndrome, not elsewhere classified: Secondary | ICD-10-CM | POA: Diagnosis not present

## 2018-07-07 DIAGNOSIS — I10 Essential (primary) hypertension: Secondary | ICD-10-CM | POA: Diagnosis not present

## 2018-07-07 DIAGNOSIS — G629 Polyneuropathy, unspecified: Secondary | ICD-10-CM | POA: Diagnosis not present

## 2018-07-07 DIAGNOSIS — M7061 Trochanteric bursitis, right hip: Secondary | ICD-10-CM | POA: Diagnosis not present

## 2018-07-07 DIAGNOSIS — Z6826 Body mass index (BMI) 26.0-26.9, adult: Secondary | ICD-10-CM | POA: Diagnosis not present

## 2018-07-14 ENCOUNTER — Encounter: Payer: Self-pay | Admitting: Neurology

## 2018-08-18 ENCOUNTER — Telehealth: Payer: Self-pay | Admitting: Cardiology

## 2018-08-18 NOTE — Telephone Encounter (Signed)
Patient calling the office for samples of medication:   1.  What medication and dosage are you requesting samples for? Bystolic  5 mg or 10mg   2.  Are you currently out of this medication?

## 2018-08-18 NOTE — Telephone Encounter (Signed)
Returned call to patient Bystolic 5 mg samples left at [employer] office front desk.

## 2018-08-25 ENCOUNTER — Other Ambulatory Visit: Payer: Self-pay | Admitting: Internal Medicine

## 2018-08-25 DIAGNOSIS — Z1231 Encounter for screening mammogram for malignant neoplasm of breast: Secondary | ICD-10-CM

## 2018-08-26 ENCOUNTER — Other Ambulatory Visit: Payer: Self-pay | Admitting: Cardiology

## 2018-08-26 NOTE — Telephone Encounter (Signed)
We could try Coreg instead of Bystolic. Start 12.5 mg bid.  Kert Shackett [place] MD, [facility]

## 2018-08-26 NOTE — Telephone Encounter (Signed)
Please advise if we can change medication, thank you!

## 2018-08-26 NOTE — Telephone Encounter (Signed)
Called patient, advised of samples up front.  Please advise if something else can be given to patient because it is too expensive and patients insurance would not pay.   Medication given: Bystolic 5 mg Lot #: [id number] Expiration: 06/2020 Qty: 3 bottles

## 2018-08-26 NOTE — Telephone Encounter (Signed)
Patient calling the office for samples of medication:   1.  What medication and dosage are you requesting samples for? Bystolic 5 mg  2.  Are you currently out of this medication? Not be out soon.  Patient also would like to speak with the nurse. Also Patient insurance will not cover this medication. Would like to know if there something cheaper. Patient going out of town

## 2018-08-27 MED ORDER — CARVEDILOL 12.5 MG PO TABS: 12.5000 mg | ORAL_TABLET | Freq: Two times a day (BID) | ORAL | 3 refills | Status: DC

## 2018-08-27 NOTE — Telephone Encounter (Signed)
Spoke to patient Dr.Jordan recommendation given.Advised to finish Bystolic then start Carvedilol 12.5 mg twice a day.Prescription sent to pharmacy.

## 2018-08-27 NOTE — Addendum Note (Signed)
Addended by: Kathyrn Lass on: 08/27/2018 09:30 AM   Modules accepted: Orders

## 2018-09-01 ENCOUNTER — Telehealth: Payer: Self-pay | Admitting: Cardiology

## 2018-09-01 NOTE — Telephone Encounter (Signed)
Spoke with pt. Pt sts that she was switched from Bystolic to Carvedilol because it would be most cost effective with her insurance. She received her Carvedilol prescription today. Pt wanted to know why the dosage was different and why she needed to take it twice a day. Patient's questions answered. Pt sts that she will complete her supply of Bystolic and then start Carvedilol. She asked that I send an update to [place]. Adv pt that I will, she voiced appreciation for the call.

## 2018-09-01 NOTE — Telephone Encounter (Signed)
° ° °  Pt c/o medication issue:  1. Name of Medication: carvedilol (COREG) 12.5 MG tablet  2. How are you currently taking this medication (dosage and times per day)? Take 1 tablet (12.5 mg total) by mouth 2 (two) times daily.  3. Are you having a reaction (difficulty breathing--STAT)? NO  4. What is your medication issue? Patient states she has questions about medication

## 2018-09-04 DIAGNOSIS — R69 Illness, unspecified: Secondary | ICD-10-CM | POA: Diagnosis not present

## 2018-10-13 ENCOUNTER — Ambulatory Visit
Admission: RE | Admit: 2018-10-13 | Discharge: 2018-10-13 | Disposition: A | Discharge status: Home / Self Care | Payer: Medicare HMO | Source: Ambulatory Visit | Attending: Internal Medicine | Admitting: Internal Medicine

## 2018-10-13 ENCOUNTER — Ambulatory Visit: Payer: Medicare HMO | Admitting: Neurology

## 2018-10-13 ENCOUNTER — Encounter: Payer: Self-pay | Admitting: Neurology

## 2018-10-13 ENCOUNTER — Encounter

## 2018-10-13 ENCOUNTER — Other Ambulatory Visit (INDEPENDENT_AMBULATORY_CARE_PROVIDER_SITE_OTHER): Payer: Medicare HMO

## 2018-10-13 VITALS — BP 122/60 | HR 64 | Ht 62.0 in | Wt 143.0 lb

## 2018-10-13 DIAGNOSIS — M792 Neuralgia and neuritis, unspecified: Secondary | ICD-10-CM

## 2018-10-13 DIAGNOSIS — M961 Postlaminectomy syndrome, not elsewhere classified: Secondary | ICD-10-CM

## 2018-10-13 DIAGNOSIS — G629 Polyneuropathy, unspecified: Secondary | ICD-10-CM

## 2018-10-13 DIAGNOSIS — Z1231 Encounter for screening mammogram for malignant neoplasm of breast: Secondary | ICD-10-CM

## 2018-10-13 LAB — VITAMIN B12: VITAMIN B 12: 514 pg/mL (ref 211–911)

## 2018-10-13 LAB — TSH: TSH: 2.45 u[IU]/mL (ref 0.35–4.50)

## 2018-10-13 MED ORDER — DULOXETINE HCL 20 MG PO CPEP: 20.0000 mg | ORAL_CAPSULE | Freq: Every day | ORAL | 0 refills | Status: DC

## 2018-10-13 NOTE — Patient Instructions (Addendum)
Start Cymbalta 20mg  daily  NCS/EMG of the legs  Check labs

## 2018-10-13 NOTE — Progress Notes (Signed)
[facility] Clinic Note - Initial Visit   Date: 10/13/18  Mary Strickland MRN: [id number] DOB: Aug 03, 1930   Dear Dr. Vertell Limber:  Thank you for your kind referral of Mary Strickland for consultation of peripheral neuropathy. Although her history is well known to you, please allow [name] to reiterate it for the purpose of our medical record. The patient was accompanied to the clinic by self.    History of Present Illness: Mary Strickland is a 84 y.o. right-handed Caucasian female with hypertension, hypothyroidism, postlaminectomy syndrome of the lumbar region, s/p spinal cord stimulator presenting for evaluation of painful neuropathy.    Starting around the summer 2018, she began having burning pain, as if there is boiling water and walking on glass. She also has severe soreness over the feet.  Sometimes, pain can be radiating from her right hip into the knee and lower leg. Symptoms are constant without any specific triggers.  Cool packs helps ease some of her pain.  Activity such as walking or prolonged standing does not exacerbate symptoms.  She did not tolerate gabapentin or Lyrica due to cognitive side effects.  She has imbalance and has been using a cane for the past 6 months.  She has some weakness of the feet. She has chronic low back pain and has a spinal cord stimulator, but does not use it.    Past Medical History:  Diagnosis Date  . Anxiety   . Back pain   . Blood loss anemia    Mild postoperative blood loss anemia  . Dyslipidemia   . GERD (gastroesophageal reflux disease)   . History of diverticulitis of colon   . Hypertension   . Idiopathic scoliosis   . Lumbosacral spondylosis   . Mitral valve prolapse   . Neuroma, Morton's    both feet,surgery done  . Palpitations    hx. of  . Psychosis ([place])   . Right carotid bruit   . Ulcer    History of ulcers    Past Surgical History:  Procedure Laterality Date  . cataracts     both eyes    . EXCISION MORTON'S NEUROMA     both feet  . LUMBAR FUSION    . OTHER SURGICAL HISTORY     hysterectomy  . OTHER SURGICAL HISTORY     left breast biopsy/lymph node  . TOTAL KNEE ARTHROPLASTY  October 2002   right     Medications:  Outpatient Encounter Medications as of 10/13/2018  Medication Sig Note  . aspirin 81 MG tablet Take 81 mg by mouth daily.   . carvedilol (COREG) 12.5 MG tablet Take 1 tablet (12.5 mg total) by mouth 2 (two) times daily.   . diazepam (VALIUM) 5 MG tablet Take 5 mg by mouth daily. 1/2 -1 daily   . levothyroxine (SYNTHROID, LEVOTHROID) 25 MCG tablet Take 25 mcg by mouth daily.   . [DISCONTINUED] pantoprazole (PROTONIX) 40 MG tablet Take 40 mg by mouth as needed.    . DULoxetine (CYMBALTA) 20 MG capsule Take 1 capsule (20 mg total) by mouth at bedtime.   . NONFORMULARY OR COMPOUNDED ITEM [facility]: Peripheral Neuropathy cream - Bupivacaine 1%, Doxepin 3%, Gabapentin 6%, Pentoxifylline 3%, Topiramate 1%, apply 1-2 grams to affected area 3-4 times a day. (Patient not taking: Reported on 10/13/2018)   . [DISCONTINUED] FLONASE 50 MCG/ACT nasal spray Inhale 2 sprays into the lungs daily. Use 2 sprays in each nostril daily 06/10/2015: Received from: External Pharmacy Received Sig:  No facility-administered encounter medications on file as of 10/13/2018.      Allergies:  Allergies  Allergen Reactions  . Percocet [Oxycodone-Acetaminophen]   . Sulfamethoxazole-Trimethoprim     REACTION: SOB, itching,  . Zocor [Simvastatin]     Family History: Family History  Problem Relation Age of Onset  . Heart disease Father   . Heart attack Father   . Heart disease Mother   . Breast cancer Neg Hx     Social History: Social History   Tobacco Use  . Smoking status: Former Research scientist (life sciences)  . Smokeless tobacco: Never Used  Substance Use Topics  . Alcohol use: No  . Drug use: No   Social History   Social History Narrative   She is widowed and has one daughter.      Review of Systems:  CONSTITUTIONAL: No fevers, chills, night sweats, or weight loss.   EYES: No visual changes or eye pain ENT: No hearing changes.  No history of nose bleeds.   RESPIRATORY: No cough, wheezing and shortness of breath.   CARDIOVASCULAR: Negative for chest pain, and palpitations.   GI: Negative for abdominal discomfort, blood in stools or black stools.  No recent change in bowel habits.   GU:  No history of incontinence.   MUSCLOSKELETAL: +history of joint pain or swelling.  No myalgias.   SKIN: Negative for lesions, rash, and itching.   HEMATOLOGY/ONCOLOGY: Negative for prolonged bleeding, bruising easily, and swollen nodes.  No history of cancer.   ENDOCRINE: Negative for cold or heat intolerance, polydipsia or goiter.   PSYCH:  No depression or anxiety symptoms.   NEURO: As Above.   Vital Signs:  BP 122/60   Pulse 64   Ht 5\' 2"  (1.575 m)   Wt 143 lb (64.9 kg)   SpO2 94%   BMI 26.16 kg/m    General Medical Exam:   General:  Well appearing, comfortable.   Eyes/ENT: see cranial nerve examination.   Neck: No masses appreciated.  Full range of motion without tenderness.  No carotid bruits. Respiratory:  Clear to auscultation, good air entry bilaterally.   Cardiac:  Regular rate and rhythm, no murmur.   Extremities:  No deformities, edema, or skin discoloration.  Skin:  No rashes or lesions.  Neurological Exam: MENTAL STATUS including orientation to time, place, person, recent and remote memory, attention span and concentration, language, and fund of knowledge is normal.  Speech is not dysarthric.  CRANIAL NERVES: II:  No visual field defects.  Unremarkable fundi.   III-IV-VI: Pupils equal round and reactive to light.  Normal conjugate, extra-ocular eye movements in all directions of gaze.  No nystagmus.  No ptosis.   V:  Normal facial sensation.    VII:  Normal facial symmetry and movements.  No pathologic facial reflexes.  VIII:  Normal hearing and  vestibular function.   IX-X:  Normal palatal movement.   XI:  Normal shoulder shrug and head rotation.   XII:  Normal tongue strength and range of motion, no deviation or fasciculation.  MOTOR:  No atrophy, fasciculations or abnormal movements.  No pronator drift.  Tone is normal.    Right Upper Extremity:    Left Upper Extremity:    Deltoid  5/5   Deltoid  5/5   Biceps  5/5   Biceps  5/5   Triceps  5/5   Triceps  5/5   Wrist extensors  5/5   Wrist extensors  5/5   Wrist flexors  5/5  Wrist flexors  5/5   Finger extensors  5/5   Finger extensors  5/5   Finger flexors  5/5   Finger flexors  5/5   Dorsal interossei  5/5   Dorsal interossei  5/5   Abductor pollicis  5/5   Abductor pollicis  5/5   Tone (Ashworth scale)  0  Tone (Ashworth scale)  0   Right Lower Extremity:    Left Lower Extremity:    Hip flexors  5/5   Hip flexors  5/5   Hip extensors  5/5   Hip extensors  5/5   Knee flexors  5/5   Knee flexors  5/5   Knee extensors  5/5   Knee extensors  5/5   Dorsiflexors  5/5   Dorsiflexors  5/5   Plantarflexors  5/5   Plantarflexors  5/5   Toe extensors  5/5   Toe extensors  5/5   Toe flexors  5/5   Toe flexors  5/5   Tone (Ashworth scale)  0+  Tone (Ashworth scale)  0+   MSRs:  Right                                                                 Left brachioradialis 2+  brachioradialis 2+  biceps 2+  biceps 2+  triceps 2+  triceps 2+  patellar 3+  patellar 3+  ankle jerk 2+  ankle jerk 2+  Hoffman no  Hoffman no  plantar response down  plantar response down   SENSORY:  Vibration is reduced at the ankles and trace at the great toe bilaterally.  Reduced temperature over the dorsum of the feet.  Pin prick causes hyperesthesia over the feet.  Rhomberg testing is negative.  COORDINATION/GAIT: Normal finger-to- nose-finger.  Intact rapid alternating movements bilaterally. Gait is slightly wide-based, appears stable and improved when assisted with  cane.   IMPRESSION: Bilateral feet pain due to neuropathic pain from combination of neuropathy and probable myelopathy.  Her exam shows distal sensory loss, as well as hyperreflexia in the legs, mild increase in tone, and normal strength.  With peripheral neuropathy alone, I would expect absent reflexes in the feet and reduced tone and therefore, I suspect she has residual lumbar canal stenosis or myelomalacia.  I will request her most recent MRI lumbar spine to review.  She will have NCS/EMG of the legs to characterize the nature of her pain due to inconsistent exam.  Further, laboratory testing will be ordered for treatable causes of neuropathy - check vitamin B12, TSH, copper, SPEP with IFE.  For pain, start Cymbalta 20mg  daily and titrate as tolerable.  She has tried gabapentin and Lyrica previously and discontinued this due to severe cognitive side effects.   Thank you for allowing me to participate in patient's care.  If I can answer any additional questions, I would be pleased to do so.    Sincerely,    Dannell Raczkowski K. Posey Pronto, DO

## 2018-10-14 ENCOUNTER — Telehealth: Payer: Self-pay | Admitting: *Deleted

## 2018-10-14 NOTE — Telephone Encounter (Signed)
[facility] and requested her MRI lumbar spine report.  They have no record of a MRI being done.

## 2018-10-15 LAB — PROTEIN ELECTROPHORESIS, SERUM
ALPHA 1: 0.3 g/dL (ref 0.2–0.3)
Albumin ELP: 4.2 g/dL (ref 3.8–4.8)
Alpha 2: 0.8 g/dL (ref 0.5–0.9)
BETA 2: 0.4 g/dL (ref 0.2–0.5)
Beta Globulin: 0.5 g/dL (ref 0.4–0.6)
GAMMA GLOBULIN: 1.3 g/dL (ref 0.8–1.7)
Total Protein: 7.5 g/dL (ref 6.1–8.1)

## 2018-10-15 LAB — IMMUNOFIXATION ELECTROPHORESIS
IGG (IMMUNOGLOBIN G), SERUM: 1425 mg/dL (ref 600–1540)
IgM, Serum: 175 mg/dL (ref 50–300)
Immunoglobulin A: 240 mg/dL (ref 20–320)

## 2018-10-15 LAB — COPPER, SERUM: Copper: 131 ug/dL (ref 70–175)

## 2018-10-16 ENCOUNTER — Telehealth: Payer: Self-pay | Admitting: *Deleted

## 2018-10-16 NOTE — Telephone Encounter (Signed)
Patient notified labs are within normal limits.

## 2018-10-16 NOTE — Telephone Encounter (Signed)
-----   Message from Alda Berthold, DO sent at 10/16/2018  9:18 AM EST ----- Please notify patient lab are within normal limits.  Thank you.

## 2018-11-18 ENCOUNTER — Ambulatory Visit (INDEPENDENT_AMBULATORY_CARE_PROVIDER_SITE_OTHER): Payer: Medicare HMO | Admitting: Neurology

## 2018-11-18 DIAGNOSIS — M792 Neuralgia and neuritis, unspecified: Secondary | ICD-10-CM | POA: Diagnosis not present

## 2018-11-18 DIAGNOSIS — M961 Postlaminectomy syndrome, not elsewhere classified: Secondary | ICD-10-CM | POA: Diagnosis not present

## 2018-11-18 DIAGNOSIS — M5417 Radiculopathy, lumbosacral region: Secondary | ICD-10-CM

## 2018-11-18 DIAGNOSIS — G629 Polyneuropathy, unspecified: Secondary | ICD-10-CM

## 2018-11-18 NOTE — Progress Notes (Signed)
Follow-up Visit   Date: 11/18/18    Mary Strickland MRN: [id number] DOB: 02-11-1930   Interim History: Mary Strickland is a 84 y.o. right-handed Caucasian female with hypertension, hypothyroidism, postlaminectomy syndrome of the lumbar region, s/p spinal cord stimulator returning to the clinic for follow-up of bilateral feet pain and right leg pain.  The patient was accompanied to the clinic by self.  History of present illness: Starting around the summer 2018, she began having burning pain, as if there is boiling water and walking on glass. She also has severe soreness over the feet.  Sometimes, pain can be radiating from her right hip into the knee and lower leg. Symptoms are constant without any specific triggers.  Cool packs helps ease some of her pain.  Activity such as walking or prolonged standing does not exacerbate symptoms.  She did not tolerate gabapentin or Lyrica due to cognitive side effects.  She has imbalance and has been using a cane for the past 6 months.  She has some weakness of the feet. She has chronic low back pain and has a spinal cord stimulator, but does not use it.   UPDATE 11/18/2018:  She is here with ongoing numbness of the feet and achy discomfort of the right thigh.  She has mild improvement since being on Cymbalta 20mg  such as pain is less intense, however, no change in numbness.   Medications:  Current Outpatient Medications on File Prior to Visit  Medication Sig Dispense Refill  . aspirin 81 MG tablet Take 81 mg by mouth daily.    . carvedilol (COREG) 12.5 MG tablet Take 1 tablet (12.5 mg total) by mouth 2 (two) times daily. 180 tablet 3  . diazepam (VALIUM) 5 MG tablet Take 5 mg by mouth daily. 1/2 -1 daily    . DULoxetine (CYMBALTA) 20 MG capsule Take 1 capsule (20 mg total) by mouth at bedtime. 90 capsule 0  . levothyroxine (SYNTHROID, LEVOTHROID) 25 MCG tablet Take 25 mcg by mouth daily.    . NONFORMULARY OR COMPOUNDED ITEM [facility]: Peripheral Neuropathy cream - Bupivacaine 1%, Doxepin 3%, Gabapentin 6%, Pentoxifylline 3%, Topiramate 1%, apply 1-2 grams to affected area 3-4 times a day. (Patient not taking: Reported on 10/13/2018) 180 each 3   No current facility-administered medications on file prior to visit.     Allergies:  Allergies  Allergen Reactions  . Percocet [Oxycodone-Acetaminophen]   . Sulfamethoxazole-Trimethoprim     REACTION: SOB, itching,  . Zocor [Simvastatin]     Review of Systems:  CONSTITUTIONAL: No fevers, chills, night sweats, or weight loss.  EYES: No visual changes or eye pain ENT: No hearing changes.  No history of nose bleeds.   RESPIRATORY: No cough, wheezing and shortness of breath.   CARDIOVASCULAR: Negative for chest pain, and palpitations.   GI: Negative for abdominal discomfort, blood in stools or black stools.  No recent change in bowel habits.   GU:  No history of incontinence.   MUSCLOSKELETAL: + history of joint pain or swelling.  No myalgias.   SKIN: Negative for lesions, rash, and itching.   ENDOCRINE: Negative for cold or heat intolerance, polydipsia or goiter.   PSYCH:  No depression or anxiety symptoms.   NEURO: As Above.    Neurological Exam: MENTAL STATUS including orientation to time, place, person, recent and remote memory, attention span and concentration, language, and fund of knowledge is fair.  She is verbose and difficult to redirect. Speech is not dysarthric.  CRANIAL NERVES:  Face is symmetric.  MOTOR:  Motor strength is 5/5 in all extremities.  Tone is mildly increased in the legs  COORDINATION/GAIT:   Gait narrow based and stable, assisted with cane  Data: NCS/EMG of the legs 11/18/2018: 1. The electrophysiologic findings are most consistent with a chronic sensorimotor axonal polyneuropathy affecting the lower extremities. 2. There is also a superimposed chronic right L4 lumbosacral radiculopathy.   IMPRESSION/PLAN: Idiopathic  peripheral neuropathy affecting the feet manifesting with numbness.  Neuropathy labs are normal.  NCS/EMG confirmed sensory predominant neuropathy.  Discussed that there is no treatment for numbness. She takes Cymbalta 20mg /d which helps her pain.  Fall precautions discussed.  Right leg pain, most likely due to chronic lumbosacral radiculopathy at L4 and lumbar canal stenosis.  Recommend that she start physical therapy for low back pain.    Post-laminectomy syndrome s/p spinal cord stimulator, followed by [facility].   Return to clinic in 4 months   The duration of this appointment visit was 25 minutes of face-to-face time with the patient.  Greater than 50% of this time was spent in counseling, explanation of diagnosis, planning of further management, and coordination of care.   Thank you for allowing me to participate in patient's care.  If I can answer any additional questions, I would be pleased to do so.    Sincerely,    Donika K. Posey Pronto, DO

## 2018-11-18 NOTE — Procedures (Signed)
[facility]  [place], [place]  [place],  [address] Tel: [phone] Fax:  [phone] Test Date:  11/18/2018  Patient: Mary Strickland DOB: 10-28-1930 Physician: Narda Amber, DO  Sex: Female Height: 5\' 2"  Ref Phys: Narda Amber  ID#: [id number] Temp: 35.0C Technician:    Patient Complaints: This is a 84 year old female with history of postlaminectomy syndrome referred for evaluation of bilateral feet numbness and right leg pain.  NCV & EMG Findings: Extensive electrodiagnostic testing of the right lower extremity and additional studies of the left shows:  1. Bilateral sural and superficial peroneal sensory responses are absent. 2. Bilateral peroneal motor responses show reduced amplitude at the extensor digitorum brevis, and is normal at the tibialis anterior.  Bilateral tibial motor responses are within normal limits. 3. Bilateral tibial H reflex studies are within normal limits. 4. Chronic motor axonal loss changes are seen affecting the muscles below the knee as well as the right rectus femoris and abductor longus muscles.  There is no evidence of accompanied active denervation.  Impression: 1. The electrophysiologic findings are most consistent with a chronic sensorimotor axonal polyneuropathy affecting the lower extremities. 2. There is also a superimposed chronic right L4 lumbosacral radiculopathy.   ___________________________ Narda Amber, DO    Nerve Conduction Studies Anti Sensory Summary Table   Site NR Peak (ms) Norm Peak (ms) P-T Amp (V) Norm P-T Amp  Left Sup Peroneal Anti Sensory (Ant Lat Mall)  35C  12 cm NR  <4.6  >3  Right Sup Peroneal Anti Sensory (Ant Lat Mall)  35C  12 cm NR  <4.6  >3  Left Sural Anti Sensory (Lat Mall)  35C  Calf NR  <4.6  >3  Right Sural Anti Sensory (Lat Mall)  35C  Calf NR  <4.6  >3   Motor Summary Table   Site NR Onset (ms) Norm Onset (ms) O-P Amp (mV) Norm O-P Amp Site1 Site2 Delta-0 (ms) Dist  (cm) Vel (m/s) Norm Vel (m/s)  Left Peroneal Motor (Ext Dig Brev)  35C  Ankle    4.1 <6.0 0.8 >2.5 B Fib Ankle 7.8 32.0 41 >40  B Fib    11.9  0.8  Poplt B Fib 1.2 8.0 67 >40  Poplt    13.1  0.8         Right Peroneal Motor (Ext Dig Brev)  35C  Ankle    2.4 <6.0 0.7 >2.5 B Fib Ankle 7.9 35.0 44 >40  B Fib    10.3  0.9  Poplt B Fib 1.7 8.0 47 >40  Poplt    12.0  0.8         Left Peroneal TA Motor (Tib Ant)  35C  Fib Head    2.6 <4.5 3.8 >3 Poplit Fib Head 1.8 8.0 44 >40  Poplit    4.4  3.7         Right Peroneal TA Motor (Tib Ant)  35C  Fib Head    2.5 <4.5 3.7 >3 Poplit Fib Head 1.3 8.0 62 >40  Poplit    3.8  3.6         Left Tibial Motor (Abd Hall Brev)  35C  Ankle    4.4 <6.0 4.3 >4 Knee Ankle 7.6 35.0 46 >40  Knee    12.0  3.1         Right Tibial Motor (Abd Hall Brev)  35C  Ankle    3.4 <6.0 4.4 >4 Knee Ankle 7.6 35.0  46 >40  Knee    11.0  3.0          H Reflex Studies   NR H-Lat (ms) Lat Norm (ms) L-R H-Lat (ms)  Left Tibial (Gastroc)  35C     32.93 <35 0.00  Right Tibial (Gastroc)  35C     32.93 <35 0.00   EMG   Side Muscle Ins Act Fibs Psw Fasc Number Recrt Dur Dur. Amp Amp. Poly Poly. Comment  Right AntTibialis Nml Nml Nml Nml Nml Nml Nml Nml Nml Nml Nml Nml N/A  Right Gastroc Nml Nml Nml Nml 1- Rapid Few 1+ Few 1+ Nml Nml N/A  Right Flex Dig Long Nml Nml Nml Nml 1- Rapid Few 1+ Few 1+ Nml Nml N/A  Right RectFemoris Nml Nml Nml Nml 1- Rapid Some 1+ Some 1+ Nml Nml N/A  Right GluteusMed Nml Nml Nml Nml Nml Nml Nml Nml Nml Nml Nml Nml N/A  Right AdductorLong Nml Nml Nml Nml 1- Rapid Some 1+ Some 1+ Nml Nml N/A  Right BicepsFemS Nml Nml Nml Nml Nml Nml Nml Nml Nml Nml Nml Nml N/A  Left AntTibialis Nml Nml Nml Nml 1- Rapid Few 1+ Few 1+ Nml Nml N/A  Left Gastroc Nml Nml Nml Nml 1- Rapid Few 1+ Few 1+ Nml Nml N/A  Left Flex Dig Long Nml Nml Nml Nml 1- Rapid Few 1+ Few 1+ Nml Nml N/A  Left RectFemoris Nml Nml Nml Nml Nml Nml Nml Nml Nml Nml Nml Nml N/A  Left  GluteusMed Nml Nml Nml Nml Nml Nml Nml Nml Nml Nml Nml Nml N/A      Waveforms:

## 2018-11-19 ENCOUNTER — Other Ambulatory Visit: Payer: Self-pay | Admitting: *Deleted

## 2018-11-19 ENCOUNTER — Telehealth: Payer: Self-pay | Admitting: Neurology

## 2018-11-19 DIAGNOSIS — E7849 Other hyperlipidemia: Secondary | ICD-10-CM | POA: Diagnosis not present

## 2018-11-19 DIAGNOSIS — G629 Polyneuropathy, unspecified: Secondary | ICD-10-CM | POA: Diagnosis not present

## 2018-11-19 DIAGNOSIS — N183 Chronic kidney disease, stage 3 (moderate): Secondary | ICD-10-CM | POA: Diagnosis not present

## 2018-11-19 DIAGNOSIS — I10 Essential (primary) hypertension: Secondary | ICD-10-CM | POA: Diagnosis not present

## 2018-11-19 DIAGNOSIS — E038 Other specified hypothyroidism: Secondary | ICD-10-CM | POA: Diagnosis not present

## 2018-11-19 DIAGNOSIS — M5417 Radiculopathy, lumbosacral region: Secondary | ICD-10-CM

## 2018-11-19 DIAGNOSIS — I129 Hypertensive chronic kidney disease with stage 1 through stage 4 chronic kidney disease, or unspecified chronic kidney disease: Secondary | ICD-10-CM | POA: Diagnosis not present

## 2018-11-19 DIAGNOSIS — M1990 Unspecified osteoarthritis, unspecified site: Secondary | ICD-10-CM | POA: Diagnosis not present

## 2018-11-19 DIAGNOSIS — K589 Irritable bowel syndrome without diarrhea: Secondary | ICD-10-CM | POA: Diagnosis not present

## 2018-11-19 DIAGNOSIS — K219 Gastro-esophageal reflux disease without esophagitis: Secondary | ICD-10-CM | POA: Diagnosis not present

## 2018-11-19 DIAGNOSIS — K5790 Diverticulosis of intestine, part unspecified, without perforation or abscess without bleeding: Secondary | ICD-10-CM | POA: Diagnosis not present

## 2018-11-19 NOTE — Telephone Encounter (Signed)
Patient called to request that her EMG results be faxed over to Dr. Delfino Lovett Aronson's office. She has an appointment with him today at 11:15 AM.  She is also requesting a copy for her self. Thanks

## 2018-11-19 NOTE — Telephone Encounter (Signed)
Report faxed.

## 2019-01-16 ENCOUNTER — Other Ambulatory Visit: Payer: Self-pay | Admitting: Neurology

## 2019-01-16 ENCOUNTER — Other Ambulatory Visit: Payer: Self-pay

## 2019-01-16 ENCOUNTER — Encounter (HOSPITAL_BASED_OUTPATIENT_CLINIC_OR_DEPARTMENT_OTHER): Payer: Self-pay

## 2019-01-16 ENCOUNTER — Emergency Department (HOSPITAL_BASED_OUTPATIENT_CLINIC_OR_DEPARTMENT_OTHER)
Admission: EM | Admit: 2019-01-16 | Discharge: 2019-01-16 | Disposition: A | Discharge status: Home / Self Care | Payer: Medicare HMO | Attending: Emergency Medicine | Admitting: Emergency Medicine

## 2019-01-16 DIAGNOSIS — W228XXA Striking against or struck by other objects, initial encounter: Secondary | ICD-10-CM | POA: Diagnosis not present

## 2019-01-16 DIAGNOSIS — Z23 Encounter for immunization: Secondary | ICD-10-CM | POA: Insufficient documentation

## 2019-01-16 DIAGNOSIS — Y929 Unspecified place or not applicable: Secondary | ICD-10-CM | POA: Insufficient documentation

## 2019-01-16 DIAGNOSIS — I10 Essential (primary) hypertension: Secondary | ICD-10-CM | POA: Insufficient documentation

## 2019-01-16 DIAGNOSIS — S51811A Laceration without foreign body of right forearm, initial encounter: Secondary | ICD-10-CM | POA: Insufficient documentation

## 2019-01-16 DIAGNOSIS — Y9389 Activity, other specified: Secondary | ICD-10-CM | POA: Insufficient documentation

## 2019-01-16 DIAGNOSIS — Y999 Unspecified external cause status: Secondary | ICD-10-CM | POA: Insufficient documentation

## 2019-01-16 DIAGNOSIS — Z87891 Personal history of nicotine dependence: Secondary | ICD-10-CM | POA: Insufficient documentation

## 2019-01-16 DIAGNOSIS — Z7982 Long term (current) use of aspirin: Secondary | ICD-10-CM | POA: Insufficient documentation

## 2019-01-16 DIAGNOSIS — Z79899 Other long term (current) drug therapy: Secondary | ICD-10-CM | POA: Insufficient documentation

## 2019-01-16 DIAGNOSIS — S41111A Laceration without foreign body of right upper arm, initial encounter: Secondary | ICD-10-CM | POA: Diagnosis not present

## 2019-01-16 MED ORDER — LIDOCAINE-EPINEPHRINE 2 %-1:100000 IJ SOLN
20.0000 mL | Freq: Once | INTRAMUSCULAR | Status: DC
  Filled 2019-01-16: qty 20

## 2019-01-16 MED ORDER — TETANUS-DIPHTH-ACELL PERTUSSIS 5-2.5-18.5 LF-MCG/0.5 IM SUSP
0.5000 mL | Freq: Once | INTRAMUSCULAR | Status: AC
  Administered 2019-01-16: 0.5 mL via INTRAMUSCULAR
  Filled 2019-01-16: qty 0.5

## 2019-01-16 MED ORDER — LIDOCAINE-EPINEPHRINE 1 %-1:100000 IJ SOLN
INTRAMUSCULAR | Status: AC
  Filled 2019-01-16: qty 1

## 2019-01-16 NOTE — ED Notes (Signed)
Pt and family member understood dc material. NAD noted. All questions answered to satisfaction.

## 2019-01-16 NOTE — ED Notes (Signed)
ED Provider at bedside. 

## 2019-01-16 NOTE — ED Provider Notes (Signed)
[facility] Provider Note   CSN: [id number] Arrival date & time: 01/16/19  2008     History   Chief Complaint Chief Complaint  Patient presents with  . Extremity Laceration    HPI Mary Strickland is a 85 y.o. female.  85 yo F with a chief complaint of a right arm laceration.  The patient was reaching down a heating ducts and scraped her arm on the outside of the duct along the metal liner.  There is no broken pieces of metal or loss of the metal liner.  She denies foreign bodies.  Unsure of her last tetanus.  She had some significant bleeding and so decided to come to the ED for evaluation.  The history is provided by the patient.  Laceration  Location:  Shoulder/arm Shoulder/arm laceration location:  R forearm Length:  4 Depth:  Through dermis Quality: straight   Bleeding: controlled with pressure   Time since incident:  2 hours Laceration mechanism:  Metal edge Pain details:    Quality:  Burning   Severity:  Mild   Timing:  Constant   Progression:  Worsening Foreign body present:  No foreign bodies Relieved by:  Nothing Worsened by:  Nothing Ineffective treatments:  None tried Tetanus status:  Unknown Associated symptoms: no fever     Past Medical History:  Diagnosis Date  . Anxiety   . Back pain   . Blood loss anemia    Mild postoperative blood loss anemia  . Dyslipidemia   . GERD (gastroesophageal reflux disease)   . History of diverticulitis of colon   . Hypertension   . Idiopathic scoliosis   . Lumbosacral spondylosis   . Mitral valve prolapse   . Neuroma, Morton's    both feet,surgery done  . Palpitations    hx. of  . Psychosis ([place])   . Right carotid bruit   . Ulcer    History of ulcers    Patient Active Problem List   Diagnosis Date Noted  . HTN (hypertension) 12/18/2011  . Dyslipidemia   . Palpitations   . GERD (gastroesophageal reflux disease)   . Neuroma, Morton's   . Back pain   . Right carotid  bruit     Past Surgical History:  Procedure Laterality Date  . cataracts     both eyes  . EXCISION MORTON'S NEUROMA     both feet  . LUMBAR FUSION    . OTHER SURGICAL HISTORY     hysterectomy  . OTHER SURGICAL HISTORY     left breast biopsy/lymph node  . TOTAL KNEE ARTHROPLASTY  October 2002   right     OB History   No obstetric history on file.      Home Medications    Prior to Admission medications   Medication Sig Start Date End Date Taking? Authorizing Provider  aspirin 81 MG tablet Take 81 mg by mouth daily.    [provider]  carvedilol (COREG) 12.5 MG tablet Take 1 tablet (12.5 mg total) by mouth 2 (two) times daily. 08/27/18 11/25/18  Martinique, Peter M, MD  diazepam (VALIUM) 5 MG tablet Take 5 mg by mouth daily. 1/2 -1 daily    [provider]  DULoxetine (CYMBALTA) 20 MG capsule TAKE 1 CAPSULE AT BEDTIME 01/16/19   Patel, Arvin Collard K, DO  levothyroxine (SYNTHROID, LEVOTHROID) 25 MCG tablet Take 25 mcg by mouth daily.    [provider]  NONFORMULARY OR COMPOUNDED [place]: Peripheral Neuropathy  cream - Bupivacaine 1%, Doxepin 3%, Gabapentin 6%, Pentoxifylline 3%, Topiramate 1%, apply 1-2 grams to affected area 3-4 times a day. Patient not taking: Reported on 10/13/2018 06/13/17   Wallene Huh, DPM    Family History Family History  Problem Relation Age of Onset  . Heart disease Father   . Heart attack Father   . Heart disease Mother   . Breast cancer Neg Hx     Social History Social History   Tobacco Use  . Smoking status: Former Research scientist (life sciences)  . Smokeless tobacco: Never Used  Substance Use Topics  . Alcohol use: No  . Drug use: No     Allergies   Percocet [oxycodone-acetaminophen]; Sulfamethoxazole-trimethoprim; and Zocor [simvastatin]   Review of Systems Review of Systems  Constitutional: Negative for chills and fever.  HENT: Negative for congestion and rhinorrhea.   Eyes: Negative for redness and visual  disturbance.  Respiratory: Negative for shortness of breath and wheezing.   Cardiovascular: Negative for chest pain and palpitations.  Gastrointestinal: Negative for nausea and vomiting.  Genitourinary: Negative for dysuria and urgency.  Musculoskeletal: Negative for arthralgias and myalgias.  Skin: Positive for wound. Negative for pallor.  Neurological: Negative for dizziness and headaches.     Physical Exam Updated Vital Signs BP (!) 211/69 (BP Location: Left Arm)   Pulse 72   Temp 98.5 F (36.9 C) (Oral)   Resp 20   SpO2 97%   Physical Exam Vitals signs and nursing note reviewed.  Constitutional:      General: She is not in acute distress.    Appearance: She is well-developed. She is not diaphoretic.  HENT:     Head: Normocephalic and atraumatic.  Eyes:     Pupils: Pupils are equal, round, and reactive to light.  Neck:     Musculoskeletal: Normal range of motion and neck supple.  Cardiovascular:     Rate and Rhythm: Normal rate and regular rhythm.     Heart sounds: No murmur. No friction rub. No gallop.   Pulmonary:     Effort: Pulmonary effort is normal.     Breath sounds: No wheezing or rales.  Abdominal:     General: There is no distension.     Palpations: Abdomen is soft.     Tenderness: There is no abdominal tenderness.  Musculoskeletal:        General: No tenderness.  Skin:    General: Skin is warm and dry.     Comments: 3 and half centimeter laceration to the right volar forearm.  Gaping, no noted foreign bodies.  Neurological:     Mental Status: She is alert and oriented to person, place, and time.  Psychiatric:        Behavior: Behavior normal.      ED Treatments / Results  Labs (all labs ordered are listed, but only abnormal results are displayed) Labs Reviewed - No data to display  EKG None  Radiology No results found.  Procedures .Marland KitchenLaceration Repair Date/Time: 01/16/2019 8:44 PM Performed by: Deno Etienne, DO Authorized by: Deno Etienne,  DO   Consent:    Consent obtained:  Verbal   Consent given by:  Patient   Risks discussed:  Infection, pain, poor cosmetic result and poor wound healing   Alternatives discussed:  No treatment, delayed treatment and observation Anesthesia (see MAR for exact dosages):    Anesthesia method:  Local infiltration   Local anesthetic:  Lidocaine 1% WITH epi Laceration details:    Location:  Shoulder/arm  Shoulder/arm location:  R lower arm   Length (cm):  3.5   Depth (mm):  4 Repair type:    Repair type:  Simple Pre-procedure details:    Preparation:  Patient was prepped and draped in usual sterile fashion Exploration:    Hemostasis achieved with:  Epinephrine and direct pressure   Wound exploration: entire depth of wound probed and visualized     Contaminated: no   Treatment:    Wound cleansed with: Chlorhexidine.   Amount of cleaning:  Standard   Irrigation solution:  Sterile saline   Irrigation volume:  50   Irrigation method:  Syringe   Visualized foreign bodies/material removed: no   Skin repair:    Repair method:  Sutures   Suture size:  5-0   Suture material:  Nylon   Suture technique:  Simple interrupted   Number of sutures:  4 Approximation:    Approximation:  Close Post-procedure details:    Dressing:  Open (no dressing)   Patient tolerance of procedure:  Tolerated well, no immediate complications   (including critical care time)  Medications Ordered in ED Medications  lidocaine-EPINEPHrine (XYLOCAINE W/EPI) 2 %-1:100000 (with pres) injection 20 mL (has no administration in time range)  lidocaine-EPINEPHrine (XYLOCAINE W/EPI) 1 %-1:100000 (with pres) injection (has no administration in time range)  Tdap (BOOSTRIX) injection 0.5 mL (0.5 mLs Intramuscular Given 01/16/19 2042)     Initial Impression / Assessment and Plan / ED Course  I have reviewed the triage vital signs and the nursing notes.  Pertinent labs & imaging results that were available during my care  of the patient were reviewed by me and considered in my medical decision making (see chart for details).     85 yo F with a chief complaint of a forearm laceration.  No noted foreign bodies.  Sutured at bedside.  Tdap updated.  Discharge home.  8:45 PM:  I have discussed the diagnosis/risks/treatment options with the patient and family and believe the pt to be eligible for discharge home to follow-up with PCP. We also discussed returning to the ED immediately if new or worsening sx occur. We discussed the sx which are most concerning (e.g., sudden worsening pain, fever, inability to tolerate by mouth) that necessitate immediate return. Medications administered to the patient during their visit and any new prescriptions provided to the patient are listed below.  Medications given during this visit Medications  lidocaine-EPINEPHrine (XYLOCAINE W/EPI) 2 %-1:100000 (with pres) injection 20 mL (has no administration in time range)  lidocaine-EPINEPHrine (XYLOCAINE W/EPI) 1 %-1:100000 (with pres) injection (has no administration in time range)  Tdap (BOOSTRIX) injection 0.5 mL (0.5 mLs Intramuscular Given 01/16/19 2042)     The patient appears reasonably screen and/or stabilized for discharge and I doubt any other medical condition or other [facility] requiring further screening, evaluation, or treatment in the ED at this time prior to discharge.    Final Clinical Impressions(s) / ED Diagnoses   Final diagnoses:  Laceration of right upper extremity, initial encounter  Essential hypertension    ED Discharge Orders    None       Deno Etienne, DO 01/16/19 2046

## 2019-01-16 NOTE — ED Triage Notes (Signed)
Pt states she cut right FA on metal flashing ~30 min PTA-lac to right FA-NAD-steady gait with own cane

## 2019-01-16 NOTE — Discharge Instructions (Signed)
You can get the wound wet but do not fully immerse it in water.  Do not scrub the area.  Return for fever redness or drainage.  Your blood pressure was elevated today you need to discuss this with your family doctor and follow-up with them in the office to see if you need a medication change.  Your sutures should be removed in 7 to 10 days, you can come back here or go to urgent care or see your family physician for this.

## 2019-01-19 ENCOUNTER — Telehealth: Payer: Self-pay | Admitting: *Deleted

## 2019-01-19 NOTE — Telephone Encounter (Signed)
Duloxetine HCL [id number] 12/02/2018 to 12/03/2019

## 2019-01-26 ENCOUNTER — Emergency Department (HOSPITAL_BASED_OUTPATIENT_CLINIC_OR_DEPARTMENT_OTHER)
Admission: EM | Admit: 2019-01-26 | Discharge: 2019-01-26 | Disposition: A | Discharge status: Home / Self Care | Payer: Medicare HMO | Attending: Emergency Medicine | Admitting: Emergency Medicine

## 2019-01-26 ENCOUNTER — Other Ambulatory Visit: Payer: Self-pay

## 2019-01-26 ENCOUNTER — Encounter (HOSPITAL_BASED_OUTPATIENT_CLINIC_OR_DEPARTMENT_OTHER): Payer: Self-pay | Admitting: *Deleted

## 2019-01-26 DIAGNOSIS — Z79899 Other long term (current) drug therapy: Secondary | ICD-10-CM | POA: Insufficient documentation

## 2019-01-26 DIAGNOSIS — Z7982 Long term (current) use of aspirin: Secondary | ICD-10-CM | POA: Diagnosis not present

## 2019-01-26 DIAGNOSIS — Z4802 Encounter for removal of sutures: Secondary | ICD-10-CM | POA: Diagnosis not present

## 2019-01-26 DIAGNOSIS — I10 Essential (primary) hypertension: Secondary | ICD-10-CM | POA: Diagnosis not present

## 2019-01-26 DIAGNOSIS — S41111D Laceration without foreign body of right upper arm, subsequent encounter: Secondary | ICD-10-CM | POA: Diagnosis not present

## 2019-01-26 DIAGNOSIS — Z87891 Personal history of nicotine dependence: Secondary | ICD-10-CM | POA: Insufficient documentation

## 2019-01-26 NOTE — ED Provider Notes (Signed)
[place] [facility] Provider Note   CSN: [id number] Arrival date & time: 01/26/19  1054    History   Chief Complaint Chief Complaint  Patient presents with  . Suture / Staple Removal    HPI Mary Strickland is a 85 y.o. female.      Suture / Staple Removal  This is a new problem. Episode onset: 1 week ago. The problem occurs constantly. The problem has not changed since onset.Associated symptoms comments: Patient is here to get her sutures removed.  She has had no drainage, redness or any complaint. Nothing aggravates the symptoms. Nothing relieves the symptoms. She has tried nothing for the symptoms.    Past Medical History:  Diagnosis Date  . Anxiety   . Back pain   . Blood loss anemia    Mild postoperative blood loss anemia  . Dyslipidemia   . GERD (gastroesophageal reflux disease)   . History of diverticulitis of colon   . Hypertension   . Idiopathic scoliosis   . Lumbosacral spondylosis   . Mitral valve prolapse   . Neuroma, Morton's    both feet,surgery done  . Palpitations    hx. of  . Psychosis ([place])   . Right carotid bruit   . Ulcer    History of ulcers    Patient Active Problem List   Diagnosis Date Noted  . HTN (hypertension) 12/18/2011  . Dyslipidemia   . Palpitations   . GERD (gastroesophageal reflux disease)   . Neuroma, Morton's   . Back pain   . Right carotid bruit     Past Surgical History:  Procedure Laterality Date  . cataracts     both eyes  . EXCISION MORTON'S NEUROMA     both feet  . LUMBAR FUSION    . OTHER SURGICAL HISTORY     hysterectomy  . OTHER SURGICAL HISTORY     left breast biopsy/lymph node  . TOTAL KNEE ARTHROPLASTY  October 2002   right     OB History   No obstetric history on file.      Home Medications    Prior to Admission medications   Medication Sig Start Date End Date Taking? Authorizing Provider  aspirin 81 MG tablet Take 81 mg by mouth daily.    [provider]  carvedilol (COREG) 12.5 MG tablet Take 1 tablet (12.5 mg total) by mouth 2 (two) times daily. 08/27/18 11/25/18  Martinique, Peter M, MD  diazepam (VALIUM) 5 MG tablet Take 5 mg by mouth daily. 1/2 -1 daily    [provider]  DULoxetine (CYMBALTA) 20 MG capsule TAKE 1 CAPSULE AT BEDTIME 01/16/19   Patel, Arvin Collard K, DO  levothyroxine (SYNTHROID, LEVOTHROID) 25 MCG tablet Take 25 mcg by mouth daily.    [provider]  NONFORMULARY OR COMPOUNDED [place]: Peripheral Neuropathy cream - Bupivacaine 1%, Doxepin 3%, Gabapentin 6%, Pentoxifylline 3%, Topiramate 1%, apply 1-2 grams to affected area 3-4 times a day. Patient not taking: Reported on 10/13/2018 06/13/17   Wallene Huh, DPM    Family History Family History  Problem Relation Age of Onset  . Heart disease Father   . Heart attack Father   . Heart disease Mother   . Breast cancer Neg Hx     Social History Social History   Tobacco Use  . Smoking status: Former Research scientist (life sciences)  . Smokeless tobacco: Never Used  Substance Use Topics  . Alcohol use: No  . Drug use: No  Allergies   Percocet [oxycodone-acetaminophen]; Sulfamethoxazole-trimethoprim; and Zocor [simvastatin]   Review of Systems Review of Systems  All other systems reviewed and are negative.    Physical Exam Updated Vital Signs BP (!) 154/85 (BP Location: Left Arm)   Pulse 66   Temp 97.7 F (36.5 C) (Oral)   Resp 14   Ht 5\' 2"  (1.575 m)   Wt 64.9 kg   SpO2 95%   BMI 26.16 kg/m   Physical Exam Vitals signs and nursing note reviewed.  Constitutional:      Appearance: Normal appearance. She is normal weight.  HENT:     Head: Normocephalic.  Cardiovascular:     Rate and Rhythm: Normal rate.  Pulmonary:     Effort: Pulmonary effort is normal.  Musculoskeletal:       Arms:  Neurological:     General: No focal deficit present.     Mental Status: She is alert. Mental status is at baseline.      ED Treatments / Results    Labs (all labs ordered are listed, but only abnormal results are displayed) Labs Reviewed - No data to display  EKG None  Radiology No results found.  Procedures .Suture Removal Date/Time: 01/26/2019 12:28 PM Performed by: Blanchie Dessert, MD Authorized by: Blanchie Dessert, MD   Consent:    Consent obtained:  Verbal   Consent given by:  Patient Location:    Location:  Upper extremity   Upper extremity location:  Arm   Arm location:  R lower arm Procedure details:    Wound appearance:  No signs of infection, good wound healing and clean   Number of sutures removed:  4 Post-procedure details:    Post-removal:  No dressing applied   Patient tolerance of procedure:  Tolerated well, no immediate complications   (including critical care time)  Medications Ordered in ED Medications - No data to display   Initial Impression / Assessment and Plan / ED Course  I have reviewed the triage vital signs and the nursing notes.  Pertinent labs & imaging results that were available during my care of the patient were reviewed by me and considered in my medical decision making (see chart for details).       Patient presenting for suture removal.  Wound is healing well and sutures were removed. Final Clinical Impressions(s) / ED Diagnoses   Final diagnoses:  Visit for suture removal    ED Discharge Orders    None       Blanchie Dessert, MD 01/26/19 1229

## 2019-01-26 NOTE — ED Notes (Signed)
Suture removal tray at bedside 

## 2019-01-26 NOTE — ED Triage Notes (Signed)
She is here to have sutures x 4 removed from right forearm well healed 1 wk old laceration.

## 2019-05-13 DIAGNOSIS — E038 Other specified hypothyroidism: Secondary | ICD-10-CM | POA: Diagnosis not present

## 2019-05-13 DIAGNOSIS — I10 Essential (primary) hypertension: Secondary | ICD-10-CM | POA: Diagnosis not present

## 2019-05-20 DIAGNOSIS — G629 Polyneuropathy, unspecified: Secondary | ICD-10-CM | POA: Diagnosis not present

## 2019-05-20 DIAGNOSIS — K589 Irritable bowel syndrome without diarrhea: Secondary | ICD-10-CM | POA: Diagnosis not present

## 2019-05-20 DIAGNOSIS — E039 Hypothyroidism, unspecified: Secondary | ICD-10-CM | POA: Diagnosis not present

## 2019-05-20 DIAGNOSIS — M1990 Unspecified osteoarthritis, unspecified site: Secondary | ICD-10-CM | POA: Diagnosis not present

## 2019-05-20 DIAGNOSIS — Z1331 Encounter for screening for depression: Secondary | ICD-10-CM | POA: Diagnosis not present

## 2019-05-20 DIAGNOSIS — Z0000 Encounter for general adult medical examination without abnormal findings: Secondary | ICD-10-CM | POA: Diagnosis not present

## 2019-05-20 DIAGNOSIS — I10 Essential (primary) hypertension: Secondary | ICD-10-CM | POA: Diagnosis not present

## 2019-05-20 DIAGNOSIS — I129 Hypertensive chronic kidney disease with stage 1 through stage 4 chronic kidney disease, or unspecified chronic kidney disease: Secondary | ICD-10-CM | POA: Diagnosis not present

## 2019-05-20 DIAGNOSIS — Z1339 Encounter for screening examination for other mental health and behavioral disorders: Secondary | ICD-10-CM | POA: Diagnosis not present

## 2019-05-20 DIAGNOSIS — K219 Gastro-esophageal reflux disease without esophagitis: Secondary | ICD-10-CM | POA: Diagnosis not present

## 2019-05-20 DIAGNOSIS — E785 Hyperlipidemia, unspecified: Secondary | ICD-10-CM | POA: Diagnosis not present

## 2019-05-20 DIAGNOSIS — N183 Chronic kidney disease, stage 3 (moderate): Secondary | ICD-10-CM | POA: Diagnosis not present

## 2019-06-16 ENCOUNTER — Telehealth: Payer: Self-pay | Admitting: Cardiology

## 2019-06-16 NOTE — Telephone Encounter (Signed)
Called patient and she would like to wait until October when its cooler.

## 2019-07-22 ENCOUNTER — Other Ambulatory Visit: Payer: Self-pay | Admitting: Cardiology

## 2019-07-22 ENCOUNTER — Other Ambulatory Visit: Payer: Self-pay | Admitting: Neurology

## 2019-07-24 ENCOUNTER — Other Ambulatory Visit: Payer: Self-pay

## 2019-07-28 ENCOUNTER — Telehealth: Payer: Self-pay | Admitting: Cardiology

## 2019-07-28 NOTE — Telephone Encounter (Signed)
Bystolic was changed to carvedilol due to cost.

## 2019-07-28 NOTE — Telephone Encounter (Signed)
Please advise if okay. Thank you!  

## 2019-07-28 NOTE — Telephone Encounter (Signed)
New Message    [facility] is calling in to see if it is ok for patient to be on carvedilol and the diastolic. Please give pharmacy a call back to advise.

## 2019-07-28 NOTE — Progress Notes (Signed)
Mary Strickland Date of Birth: 06-19-1930 Medical Record [id number]  History of Present Illness: Mary Strickland is seen for  followup of palpitations.  She also has a history of hypertension and hyperlipidemia. Prior echocardiogram in 2009 was unremarkable.   On follow up today she is doing OK. She is confused about her medication. She was on Bystolic but this was going to cost her $400 so we switched her to Carvedilol.   Current Outpatient Medications on File Prior to Visit  Medication Sig Dispense Refill  . aspirin 81 MG tablet Take 81 mg by mouth daily.    . carvedilol (COREG) 12.5 MG tablet Take 1 tablet (12.5 mg total) by mouth 2 (two) times daily. Please make overdue appt with Dr. Martinique before anymore refills. 1st attempt 60 tablet 0  . diazepam (VALIUM) 5 MG tablet Take 5 mg by mouth daily. 1/2 -1 daily    . DULoxetine (CYMBALTA) 20 MG capsule TAKE 1 CAPSULE AT BEDTIME 90 capsule 1  . levothyroxine (SYNTHROID, LEVOTHROID) 25 MCG tablet Take 25 mcg by mouth daily.    . NONFORMULARY OR COMPOUNDED ITEM [facility]: Peripheral Neuropathy cream - Bupivacaine 1%, Doxepin 3%, Gabapentin 6%, Pentoxifylline 3%, Topiramate 1%, apply 1-2 grams to affected area 3-4 times a day. 180 each 3  . pantoprazole (PROTONIX) 40 MG tablet      No current facility-administered medications on file prior to visit.     Allergies  Allergen Reactions  . Percocet [Oxycodone-Acetaminophen]   . Sulfamethoxazole-Trimethoprim     REACTION: SOB, itching,  . Zocor [Simvastatin]     Past Medical History:  Diagnosis Date  . Anxiety   . Back pain   . Blood loss anemia    Mild postoperative blood loss anemia  . Dyslipidemia   . GERD (gastroesophageal reflux disease)   . History of diverticulitis of colon   . Hypertension   . Idiopathic scoliosis   . Lumbosacral spondylosis   . Mitral valve prolapse   . Neuroma, Morton's    both feet,surgery done  . Palpitations    hx. of  . Psychosis ([place])    . Right carotid bruit   . Ulcer    History of ulcers    Past Surgical History:  Procedure Laterality Date  . cataracts     both eyes  . EXCISION MORTON'S NEUROMA     both feet  . LUMBAR FUSION    . OTHER SURGICAL HISTORY     hysterectomy  . OTHER SURGICAL HISTORY     left breast biopsy/lymph node  . TOTAL KNEE ARTHROPLASTY  October 2002   right    Social History   Tobacco Use  Smoking Status Former Smoker  Smokeless Tobacco Never Used    Social History   Substance and Sexual Activity  Alcohol Use No    Family History  Problem Relation Age of Onset  . Heart disease Father   . Heart attack Father   . Heart disease Mother   . Breast cancer Neg Hx     Review of Systems: As noted in HPI.  All other systems were reviewed and are negative.  Physical Exam: BP (!) 124/56 (BP Location: Left Arm, Cuff Size: Normal)   Pulse 65   Temp (!) 97.5 F (36.4 C)   Ht 5\' 2"  (1.575 m)   Wt 150 lb (68 kg)   SpO2 97%   BMI 27.44 kg/m  GENERAL:  Well appearing, elderly WF in NAD HEENT:  PERRL, EOMI,  sclera are clear. Oropharynx is clear. NECK:  No jugular venous distention, carotid upstroke brisk and symmetric, no bruits, no thyromegaly or adenopathy LUNGS:  Clear to auscultation bilaterally CHEST:  Unremarkable HEART:  RRR,  PMI not displaced or sustained,S1 and S2 within normal limits, no S3, no S4: no clicks, no rubs, no murmurs ABD:  Soft, nontender. BS +, no masses or bruits. No hepatomegaly, no splenomegaly EXT:  2 + pulses throughout, no edema, no cyanosis no clubbing SKIN:  Warm and dry.  No rashes NEURO:  Alert and oriented x 3. Cranial nerves II through XII intact. PSYCH:  Cognitively intact    LABORATORY DATA: Ecg today shows NSR rate 62.. Normal. I have personally reviewed and interpreted this study.  Labs reviewed from 04/19/16: cholesterol 212, triglycerides 137, LDL 147, HDL 38. Creatinine 1.1. Remainder of CMET normal. CBC normal. Dated 04/17/17:  Cholesterol 208, triglycerides 240, HDL 35, LDL 125. Creatinine 1.1. Otherwise CBC, chemistries and TSH normal.  Dated 05/13/19: cholesterol 191, triglycerides 147, HDL 39, LDL 123. Creatinine 1.1. ALT and TSH normal  Assessment / Plan:  1. HTN controlled. Continue Carvedilol. We did clarify this with her pharmacy.  2. Palpitations-very infrequent and benign. None recently  Follow up in one year.

## 2019-07-29 ENCOUNTER — Encounter: Payer: Self-pay | Admitting: Cardiology

## 2019-07-29 ENCOUNTER — Ambulatory Visit (INDEPENDENT_AMBULATORY_CARE_PROVIDER_SITE_OTHER): Payer: Medicare HMO | Admitting: Cardiology

## 2019-07-29 ENCOUNTER — Other Ambulatory Visit: Payer: Self-pay

## 2019-07-29 VITALS — BP 124/56 | HR 65 | Temp 97.5°F | Ht 62.0 in | Wt 150.0 lb

## 2019-07-29 DIAGNOSIS — I10 Essential (primary) hypertension: Secondary | ICD-10-CM | POA: Diagnosis not present

## 2019-07-29 NOTE — Patient Instructions (Signed)
Medication Instructions:  Continue same medications If you need a refill on your cardiac medications before your next appointment, please call your pharmacy.   Lab work: None ordered   Testing/Procedures: None ordred  Follow-Up: At [facility], you and your health needs are our priority.  As part of our continuing mission to provide you with exceptional heart care, we have created designated Provider Care Teams.  These Care Teams include your primary Cardiologist (physician) and Advanced Practice Providers (APPs -  Physician Assistants and Nurse Practitioners) who all work together to provide you with the care you need, when you need it. . Schedule follow up appointment in 1 year   Call in May to schedule August appointment

## 2019-08-31 DIAGNOSIS — M961 Postlaminectomy syndrome, not elsewhere classified: Secondary | ICD-10-CM | POA: Diagnosis not present

## 2019-08-31 DIAGNOSIS — Z6827 Body mass index (BMI) 27.0-27.9, adult: Secondary | ICD-10-CM | POA: Diagnosis not present

## 2019-08-31 DIAGNOSIS — I10 Essential (primary) hypertension: Secondary | ICD-10-CM | POA: Diagnosis not present

## 2019-08-31 DIAGNOSIS — R69 Illness, unspecified: Secondary | ICD-10-CM | POA: Diagnosis not present

## 2019-08-31 DIAGNOSIS — M5416 Radiculopathy, lumbar region: Secondary | ICD-10-CM | POA: Diagnosis not present

## 2019-08-31 DIAGNOSIS — M545 Low back pain: Secondary | ICD-10-CM | POA: Diagnosis not present

## 2019-10-07 DIAGNOSIS — I129 Hypertensive chronic kidney disease with stage 1 through stage 4 chronic kidney disease, or unspecified chronic kidney disease: Secondary | ICD-10-CM | POA: Diagnosis not present

## 2019-10-07 DIAGNOSIS — M5489 Other dorsalgia: Secondary | ICD-10-CM | POA: Diagnosis not present

## 2019-10-07 DIAGNOSIS — N1831 Chronic kidney disease, stage 3a: Secondary | ICD-10-CM | POA: Diagnosis not present

## 2019-12-11 ENCOUNTER — Other Ambulatory Visit: Payer: Self-pay | Admitting: Cardiology

## 2020-02-25 ENCOUNTER — Other Ambulatory Visit: Payer: Self-pay | Admitting: Cardiology

## 2020-03-25 ENCOUNTER — Other Ambulatory Visit: Payer: Self-pay

## 2020-03-25 ENCOUNTER — Other Ambulatory Visit: Payer: Self-pay | Admitting: Cardiology

## 2020-03-25 MED ORDER — CARVEDILOL 12.5 MG PO TABS: 12.5000 mg | ORAL_TABLET | Freq: Two times a day (BID) | ORAL | 3 refills | Status: DC

## 2020-03-25 NOTE — Telephone Encounter (Signed)
New Message      *STAT* If patient is at the pharmacy, call can be transferred to refill team.   1. Which medications need to be refilled? (please list name of each medication and dose if known) carvedilol (COREG) 12.5 MG tablet  2. Which pharmacy/location (including street and city if local pharmacy) is medication to be sent to? [facility] [place] [place] [facility] Portal to [facility]  3. Do they need a 30 day or 90 day supply? [facility] is calling and says the last prescription was lost on delivery and they are needing to replace them for the patient

## 2020-03-28 MED ORDER — CARVEDILOL 12.5 MG PO TABS: 12.5000 mg | ORAL_TABLET | Freq: Two times a day (BID) | ORAL | 0 refills | Status: DC

## 2020-03-28 MED ORDER — CARVEDILOL 12.5 MG PO TABS: 12.5000 mg | ORAL_TABLET | Freq: Two times a day (BID) | ORAL | 3 refills | Status: DC

## 2020-03-28 NOTE — Telephone Encounter (Signed)
Rx for carvedilol 12.5 BID [facility] ([facility] Also sent another 90 day with 3 refills [facility]

## 2020-07-25 DIAGNOSIS — E039 Hypothyroidism, unspecified: Secondary | ICD-10-CM | POA: Diagnosis not present

## 2020-07-25 DIAGNOSIS — E785 Hyperlipidemia, unspecified: Secondary | ICD-10-CM | POA: Diagnosis not present

## 2020-08-03 DIAGNOSIS — K589 Irritable bowel syndrome without diarrhea: Secondary | ICD-10-CM | POA: Diagnosis not present

## 2020-08-03 DIAGNOSIS — I10 Essential (primary) hypertension: Secondary | ICD-10-CM | POA: Diagnosis not present

## 2020-08-03 DIAGNOSIS — E039 Hypothyroidism, unspecified: Secondary | ICD-10-CM | POA: Diagnosis not present

## 2020-08-03 DIAGNOSIS — I129 Hypertensive chronic kidney disease with stage 1 through stage 4 chronic kidney disease, or unspecified chronic kidney disease: Secondary | ICD-10-CM | POA: Diagnosis not present

## 2020-08-03 DIAGNOSIS — M1990 Unspecified osteoarthritis, unspecified site: Secondary | ICD-10-CM | POA: Diagnosis not present

## 2020-08-03 DIAGNOSIS — R82998 Other abnormal findings in urine: Secondary | ICD-10-CM | POA: Diagnosis not present

## 2020-08-03 DIAGNOSIS — K921 Melena: Secondary | ICD-10-CM | POA: Diagnosis not present

## 2020-08-03 DIAGNOSIS — Z0000 Encounter for general adult medical examination without abnormal findings: Secondary | ICD-10-CM | POA: Diagnosis not present

## 2020-08-03 DIAGNOSIS — K219 Gastro-esophageal reflux disease without esophagitis: Secondary | ICD-10-CM | POA: Diagnosis not present

## 2020-08-03 DIAGNOSIS — N1831 Chronic kidney disease, stage 3a: Secondary | ICD-10-CM | POA: Diagnosis not present

## 2020-08-03 DIAGNOSIS — E785 Hyperlipidemia, unspecified: Secondary | ICD-10-CM | POA: Diagnosis not present

## 2020-09-08 NOTE — Progress Notes (Signed)
Mary Strickland Date of Birth: 1930/08/15 Medical Record #[id number]  History of Present Illness: Mary Strickland is seen for  followup of palpitations.  She also has a history of hypertension and hyperlipidemia. Prior echocardiogram in 2009 was unremarkable.   On follow up today she is doing OK. She has rare palpitations. No chest pain, edema, or SOB.   Current Outpatient Medications on File Prior to Visit  Medication Sig Dispense Refill   aspirin 81 MG tablet Take 81 mg by mouth daily.     diazepam (VALIUM) 5 MG tablet Take 5 mg by mouth daily. 1/2 -1 daily     DULoxetine (CYMBALTA) 20 MG capsule TAKE 1 CAPSULE AT BEDTIME 90 capsule 1   levothyroxine (SYNTHROID, LEVOTHROID) 25 MCG tablet Take 25 mcg by mouth daily.     NONFORMULARY OR COMPOUNDED ITEM [facility]: Peripheral Neuropathy cream - Bupivacaine 1%, Doxepin 3%, Gabapentin 6%, Pentoxifylline 3%, Topiramate 1%, apply 1-2 grams to affected area 3-4 times a day. 180 each 3   pantoprazole (PROTONIX) 40 MG tablet      No current facility-administered medications on file prior to visit.    Allergies  Allergen Reactions   Percocet [Oxycodone-Acetaminophen]    Sulfamethoxazole-Trimethoprim     REACTION: SOB, itching,   Zocor [Simvastatin]     Past Medical History:  Diagnosis Date   Anxiety    Back pain    Blood loss anemia    Mild postoperative blood loss anemia   Dyslipidemia    GERD (gastroesophageal reflux disease)    History of diverticulitis of colon    Hypertension    Idiopathic scoliosis    Lumbosacral spondylosis    Mitral valve prolapse    Neuroma, Morton's    both feet,surgery done   Palpitations    hx. of   Psychosis (HCC)    Right carotid bruit    Ulcer    History of ulcers    Past Surgical History:  Procedure Laterality Date   cataracts     both eyes   EXCISION MORTON'S NEUROMA     both feet   LUMBAR FUSION     OTHER SURGICAL HISTORY     hysterectomy    OTHER SURGICAL HISTORY     left breast biopsy/lymph node   TOTAL KNEE ARTHROPLASTY  October 2002   right    Social History   Tobacco Use  Smoking Status Former Smoker  Smokeless Tobacco Never Used    Social History   Substance and Sexual Activity  Alcohol Use No    Family History  Problem Relation Age of Onset   Heart disease Father    Heart attack Father    Heart disease Mother    Breast cancer Neg Hx     Review of Systems: As noted in HPI.  All other systems were reviewed and are negative.  Physical Exam: BP (!) 178/74 (BP Location: Left Arm, Patient Position: Sitting, Cuff Size: Normal)    Pulse 70    Ht 5\' 2"  (1.575 m)    Wt 161 lb (73 kg)    BMI 29.45 kg/m  GENERAL:  Well appearing, elderly WF in NAD HEENT:  PERRL, EOMI, sclera are clear. Oropharynx is clear. NECK:  No jugular venous distention, carotid upstroke brisk and symmetric, no bruits, no thyromegaly or adenopathy LUNGS:  Clear to auscultation bilaterally CHEST:  Unremarkable HEART:  RRR,  PMI not displaced or sustained,S1 and S2 within normal limits, no S3, no S4: no clicks, no rubs,  no murmurs ABD:  Soft, nontender. BS +, no masses or bruits. No hepatomegaly, no splenomegaly EXT:  2 + pulses throughout, no edema, no cyanosis no clubbing SKIN:  Warm and dry.  No rashes NEURO:  Alert and oriented x 3. Cranial nerves II through XII intact. PSYCH:  Cognitively intact    LABORATORY DATA: Ecg today shows NSR rate 70.Marland Kitchen poor R wave progression.  I have personally reviewed and interpreted this study.  Labs reviewed from 04/19/16: cholesterol 212, triglycerides 137, LDL 147, HDL 38. Creatinine 1.1. Remainder of CMET normal. CBC normal. Dated 04/17/17: Cholesterol 208, triglycerides 240, HDL 35, LDL 125. Creatinine 1.1. Otherwise CBC, chemistries and TSH normal.  Dated 05/13/19: cholesterol 191, triglycerides 147, HDL 39, LDL 123. Creatinine 1.1. ALT and TSH normal  Assessment / Plan:  1. HTN poorly  controlled. Will increase Coreg to 25 mg bid.   2. Palpitations-very infrequent and benign. Continue Coreg.   Follow up in one year.

## 2020-09-15 ENCOUNTER — Ambulatory Visit (INDEPENDENT_AMBULATORY_CARE_PROVIDER_SITE_OTHER): Payer: Medicare HMO | Admitting: Cardiology

## 2020-09-15 ENCOUNTER — Encounter: Payer: Self-pay | Admitting: Cardiology

## 2020-09-15 ENCOUNTER — Other Ambulatory Visit: Payer: Self-pay

## 2020-09-15 VITALS — BP 178/74 | HR 70 | Ht 62.0 in | Wt 161.0 lb

## 2020-09-15 DIAGNOSIS — I10 Essential (primary) hypertension: Secondary | ICD-10-CM | POA: Diagnosis not present

## 2020-09-15 DIAGNOSIS — R002 Palpitations: Secondary | ICD-10-CM

## 2020-09-15 DIAGNOSIS — R69 Illness, unspecified: Secondary | ICD-10-CM | POA: Diagnosis not present

## 2020-09-15 MED ORDER — CARVEDILOL 25 MG PO TABS: 25.0000 mg | ORAL_TABLET | Freq: Two times a day (BID) | ORAL | 3 refills | Status: DC

## 2020-09-15 NOTE — Patient Instructions (Signed)
Increase Carvedilol to 25 mg twice a day  You can use up your current pills- just double the dose until you get your new presciption.

## 2020-09-19 ENCOUNTER — Ambulatory Visit: Payer: Medicare HMO | Admitting: Cardiology

## 2020-09-19 ENCOUNTER — Telehealth: Payer: Self-pay | Admitting: Cardiology

## 2020-09-19 NOTE — Telephone Encounter (Signed)
Patient is following up, requesting to speak with Malachy Mood. She states she would also like to confirm instructions on how to take aspirin 81 MG tablet.

## 2020-09-19 NOTE — Telephone Encounter (Signed)
Spoke to patient she was calling to confirm how to take aspirin.Advised she should take aspirin 81 mg daily.

## 2020-09-19 NOTE — Telephone Encounter (Signed)
New Message:     Please call, questions about her Carvedilol.

## 2020-09-19 NOTE — Telephone Encounter (Signed)
Spoke to patient she wanted to make sure she understood how to take coreg.Advised to take coreg 12.5 mg 2 tablets twice a day until she gets her new prescription 25 mg tablet 1 tablet twice a day.

## 2020-09-21 ENCOUNTER — Telehealth: Payer: Self-pay | Admitting: Cardiology

## 2020-09-21 NOTE — Telephone Encounter (Signed)
Returned call to pt discuss carvedilol, all questions answered. Verbalized understanding. Nothing further needed.

## 2020-09-21 NOTE — Telephone Encounter (Signed)
New message:    Patient calling stating that she is not sure of how she should be taken. Please call patient back.

## 2020-12-30 NOTE — Telephone Encounter (Signed)
Pt c/o medication issue:  1. Name of Medication:carvedilol (COREG) 25 MG tablet [[id number]   2. How are you currently taking this medication (dosage and times per day)? Take 1 tablet (25 mg total) by mouth 2 (two) times daily.  3. Are you having a reaction (difficulty breathing--STAT)? no  4. What is your medication issue? Pt called in and would like to know why this med was up from 25 mg total a day to 50mg  total a day.  She wanted to know the reason.  She stated it is making her a little dizzy.  She would like to speak with a nurse about this med  Best number [phone]

## 2020-12-30 NOTE — Telephone Encounter (Signed)
I spoke with the patient. She called back today to inquire as to why her coreg was increased to 25 mg BID. She felt like she had had no explanation of what was going on with her heart and if everything was ok.  I reviewed the patient's chart and advised her Dr. Martinique has her on the coreg for 2 reasons: 1) initially for her palpitations 2) now also using for her BP, which is the reason the dose was increased in 10/ 2021 at her last office visit: BP 178/74 at that time.   The patient reports some dizziness and fatigue on the coreg 25 mg BID dose.  She does not have a BP cuff at home.  She states her symptoms are tolerable.  I have asked her to: 1) check her BP periodically at the pharmacy/ fire department or 2) try to obtain a home BP cuff  I have also asked her to check her BP periodically and call with readings so we can determine if BP/ HR are tolerating the increased dose of coreg.  The patient voices understanding and is agreeable.

## 2021-01-10 ENCOUNTER — Telehealth: Payer: Self-pay | Admitting: Cardiology

## 2021-01-10 NOTE — Telephone Encounter (Signed)
Spoke to patient she stated she has been feeling bad.She is weak,no energy.Stated she stopped taking Coreg 25 mg twice a day. She stopped taking yesterday.She is unable to check B/P.She does not have a B/P monitor. Advised to take Coreg 12.5 mg twice a day.Appoinntment scheduled with Coletta Memos NP 01/18/21 at 2:15 pm.I will make Dr.Jordan aware.

## 2021-01-10 NOTE — Telephone Encounter (Signed)
Pt c/o medication issue:  1. Name of Medication:  carvedilol (COREG) 25 MG tablet [[id number]]   2. How are you currently taking this medication (dosage and times per day)?  Take 1 tablet (25 mg total) by mouth 2 (two) times daily.  3. Are you having a reaction (difficulty breathing--STAT)? No   4. What is your medication issue? She stated since she has been taking this med she feel tired and feels like she is in a fog.   She would like to speak with Dr Martinique nurse about this med   Best number [phone]

## 2021-01-16 NOTE — Progress Notes (Signed)
Cardiology Clinic Note   Patient Name: Mary Strickland Date of Encounter: 01/18/2021  Primary Care Provider:  Burnard Bunting, MD Primary Cardiologist:  Peter Martinique, MD  Patient Profile    Mary Strickland 87 year old female presents to the clinic today for an evaluation of her weakness and lack of energy.  Past Medical History    Past Medical History:  Diagnosis Date  . Anxiety   . Back pain   . Blood loss anemia    Mild postoperative blood loss anemia  . Dyslipidemia   . GERD (gastroesophageal reflux disease)   . History of diverticulitis of colon   . Hypertension   . Idiopathic scoliosis   . Lumbosacral spondylosis   . Mitral valve prolapse   . Neuroma, Morton's    both feet,surgery done  . Palpitations    hx. of  . Psychosis ([place])   . Right carotid bruit   . Ulcer    History of ulcers   Past Surgical History:  Procedure Laterality Date  . cataracts     both eyes  . EXCISION MORTON'S NEUROMA     both feet  . LUMBAR FUSION    . OTHER SURGICAL HISTORY     hysterectomy  . OTHER SURGICAL HISTORY     left breast biopsy/lymph node  . TOTAL KNEE ARTHROPLASTY  October 2002   right    Allergies  Allergies  Allergen Reactions  . Percocet [Oxycodone-Acetaminophen]   . Sulfamethoxazole-Trimethoprim     REACTION: SOB, itching,  . Zocor [Simvastatin]     History of Present Illness    Mary Strickland has a PMH of essential hypertension, hyperlipidemia and palpitations.  Her echocardiogram in 2009 was unremarkable.  She was last seen by Dr. Martinique on 09/15/2020.  During that time she reported she was doing okay.  She reported rare episodes of palpitations and denied chest pain, lower extremity swelling, and shortness of breath.  She contacted the nurse triage line on 01/10/2021 and reported she was feeling bad.  She reported she had no energy and felt very weak.  She had stopped taking her carvedilol 25 mg twice daily the day prior to contacting the office.  She  was advised to continue her carvedilol 12.5 mg twice daily and follow-up.  She presents the clinic today for evaluation and states she felt weak and fatigued while taking her carvedilol.  She reports that she stopped taking the medication and her symptoms resolved.  She has not been checking her blood pressure.  She presents the clinic today with her neighbor who states he will take her to the pharmacy to pick up her prescription and get a blood pressure cuff.  I have asked her to monitor her blood pressure daily an hour after she takes her medication.  I have also asked her to reduce and cut out as much salt in her diet as she can.  She reports she is not very physically active due to knee pain.  I will give her the salty 6 diet sheet, discontinue her carvedilol and place her on valsartan 40 mg daily, have her maintain her blood pressure log and follow-up with me in 1 week.  I will order a BMP in 1 week.  Today she denies chest pain, shortness of breath, lower extremity edema, fatigue, palpitations, melena, hematuria, hemoptysis, diaphoresis, weakness, presyncope, syncope, orthopnea, and PND.   Home Medications    Prior to Admission medications   Medication Sig Start Date End Date Taking? Authorizing  Provider  aspirin 81 MG tablet Take 81 mg by mouth daily.    [provider]  carvedilol (COREG) 25 MG tablet Take 1 tablet (25 mg total) by mouth 2 (two) times daily. 09/15/20 09/10/21  Martinique, Peter M, MD  diazepam (VALIUM) 5 MG tablet Take 5 mg by mouth daily. 1/2 -1 daily    [provider]  DULoxetine (CYMBALTA) 20 MG capsule TAKE 1 CAPSULE AT BEDTIME 07/22/19   Patel, Arvin Collard K, DO  levothyroxine (SYNTHROID, LEVOTHROID) 25 MCG tablet Take 25 mcg by mouth daily.    [provider]  NONFORMULARY OR COMPOUNDED [facility]: Peripheral Neuropathy cream - Bupivacaine 1%, Doxepin 3%, Gabapentin 6%, Pentoxifylline 3%, Topiramate 1%, apply 1-2 grams to affected area 3-4  times a day. 06/13/17   Wallene Huh, DPM  pantoprazole (PROTONIX) 40 MG tablet  07/23/19   [provider]    Family History    Family History  Problem Relation Age of Onset  . Heart disease Father   . Heart attack Father   . Heart disease Mother   . Breast cancer Neg Hx    She indicated that her mother is deceased. She indicated that her father is deceased. She indicated that her sister is deceased. She indicated that both of her brothers are deceased. She indicated that her maternal grandmother is deceased. She indicated that her maternal grandfather is deceased. She indicated that her paternal grandmother is deceased. She indicated that her paternal grandfather is deceased. She indicated that the status of her neg hx is unknown.  Social History    Social History   Socioeconomic History  . Marital status: Widowed    Spouse name: Not on file  . Number of children: Not on file  . Years of education: Not on file  . Highest education level: Not on file  Occupational History  . Not on file  Tobacco Use  . Smoking status: Former Research scientist (life sciences)  . Smokeless tobacco: Never Used  Vaping Use  . Vaping Use: Never used  Substance and Sexual Activity  . Alcohol use: No  . Drug use: No  . Sexual activity: Not on file  Other Topics Concern  . Not on file  Social History Narrative   She is widowed and has one daughter.    Social Determinants of Health   Financial Resource Strain: Not on file  Food Insecurity: Not on file  Transportation Needs: Not on file  Physical Activity: Not on file  Stress: Not on file  Social Connections: Not on file  Intimate Partner Violence: Not on file     Review of Systems    General:  No chills, fever, night sweats or weight changes.  Cardiovascular:  No chest pain, dyspnea on exertion, edema, orthopnea, palpitations, paroxysmal nocturnal dyspnea. Dermatological: No rash, lesions/masses Respiratory: No cough, dyspnea Urologic: No hematuria,  dysuria Abdominal:   No nausea, vomiting, diarrhea, bright red blood per rectum, melena, or hematemesis Neurologic:  No visual changes, wkns, changes in mental status. All other systems reviewed and are otherwise negative except as noted above.  Physical Exam    VS:  BP (!) 172/86   Pulse 82   Ht 5\' 2"  (1.575 m)   Wt 162 lb 3.2 oz (73.6 kg)   SpO2 96%   BMI 29.67 kg/m  , BMI Body mass index is 29.67 kg/m. GEN: Well nourished, well developed, in no acute distress. HEENT: normal. Neck: Supple, no JVD, carotid bruits, or masses. Cardiac: RRR,  no murmurs, rubs, or gallops. No clubbing, cyanosis, edema.  Radials/DP/PT 2+ and equal bilaterally.  Respiratory:  Respirations regular and unlabored, clear to auscultation bilaterally. GI: Soft, nontender, nondistended, BS + x 4. MS: no deformity or atrophy. Skin: warm and dry, no rash. Neuro:  Strength and sensation are intact. Psych: Normal affect.  Accessory Clinical Findings    Recent Labs: No results found for requested labs within last 8760 hours.   Recent Lipid Panel No results found for: CHOL, TRIG, HDL, CHOLHDL, VLDL, LDLCALC, LDLDIRECT  ECG personally reviewed by me today-none today.  EKG 09/15/2020 Accelerated junctional rhythm anterior infarct undetermined age 75 bpm  Assessment & Plan   1.  Weakness/fatigue-reports increased weakness and lack of energy for the past 2 weeks.  Resolved with stopping carvedilol. Continue to monitor  Palpitations-denies recent episodes of accelerated or irregular heartbeats. Avoid triggers caffeine, chocolate, EtOH, dehydration etc. Maintain physical activity  Essential hypertension-BP today 172/86.  Well-controlled at home. Start valsartan 40 mg Heart healthy low-sodium diet-salty 6 given Increase physical activity as tolerated Maintain blood pressure log Order BMP in 1 week  Disposition: Follow-up with Dr. Martinique or me in 1 week  Jossie Ng. Gunnison Chahal NP-C    01/18/2021, 2:37  PM [address] [place] [address] Office [phone] Fax [phone]  Notice: This dictation was prepared with Dragon dictation along with smaller phrase technology. Any transcriptional errors that result from this process are unintentional and may not be corrected upon review.  I spent 10 minutes examining this patient, reviewing medications, and using patient centered shared decision making involving her cardiac care.  Prior to her visit I spent greater than 20 minutes reviewing her past medical history,  medications, and prior cardiac tests.

## 2021-01-18 ENCOUNTER — Encounter: Payer: Self-pay | Admitting: General Practice

## 2021-01-18 ENCOUNTER — Other Ambulatory Visit: Payer: Self-pay

## 2021-01-18 ENCOUNTER — Ambulatory Visit (INDEPENDENT_AMBULATORY_CARE_PROVIDER_SITE_OTHER): Payer: Medicare HMO | Admitting: General Practice

## 2021-01-18 VITALS — BP 172/86 | HR 82 | Ht 62.0 in | Wt 162.2 lb

## 2021-01-18 DIAGNOSIS — R002 Palpitations: Secondary | ICD-10-CM | POA: Diagnosis not present

## 2021-01-18 DIAGNOSIS — I10 Essential (primary) hypertension: Secondary | ICD-10-CM

## 2021-01-18 DIAGNOSIS — R531 Weakness: Secondary | ICD-10-CM

## 2021-01-18 DIAGNOSIS — R5383 Other fatigue: Secondary | ICD-10-CM

## 2021-01-18 DIAGNOSIS — Z79899 Other long term (current) drug therapy: Secondary | ICD-10-CM | POA: Diagnosis not present

## 2021-01-18 MED ORDER — VALSARTAN 40 MG PO TABS: 40.0000 mg | ORAL_TABLET | Freq: Every day | ORAL | 6 refills | Status: DC

## 2021-01-18 NOTE — Patient Instructions (Signed)
Medication Instructions:  RE-START ASPIRIN 81MG   START VALSARTAN 40MG  DAILY *If you need a refill on your cardiac medications before your next appointment, please call your pharmacy*  Lab Work:   Testing/Procedures:  BMET IN 1 WEEK  NONE  Special Instructions TAKE AND LOG YOUR BLOOD PRESSURE DAILY-AT LEAST 1 HOUR AFTER TAKING YOUR MEDICATION  PLEASE READ AND FOLLOW SALTY 6-ATTACHED-1,800mg  daily  PLEASE INCREASE PHYSICAL ACTIVITY AS TOLERATED  Follow-Up: Your next appointment:  1 week(s) In Person with Peter Martinique, MD OR IF UNAVAILABLE [place], FNP-C  At [facility], you and your health needs are our priority.  As part of our continuing mission to provide you with exceptional heart care, we have created designated Provider Care Teams.  These Care Teams include your primary Cardiologist (physician) and Advanced Practice Providers (APPs -  Physician Assistants and Nurse Practitioners) who all work together to provide you with the care you need, when you need it.  We recommend signing up for the patient portal called "MyChart".  Sign up information is provided on this After Visit Summary.  MyChart is used to connect with patients for Virtual Visits (Telemedicine).  Patients are able to view lab/test results, encounter notes, upcoming appointments, etc.  Non-urgent messages can be sent to your provider as well.   To learn more about what you can do with MyChart, go to [email or web address].              6 SALTY THINGS TO AVOID     1,800MG  DAILY

## 2021-01-19 ENCOUNTER — Telehealth: Payer: Self-pay | Admitting: Cardiology

## 2021-01-19 NOTE — Telephone Encounter (Signed)
Spoke to patient's daughter Malachy Mood.She is concerned her mother's memory is getting much worse to the point she is not save to leave alone.She was wanting a referral to a nursing home.Advised she will need to call her PCP.I will make Dr.Jordan aware.

## 2021-01-19 NOTE — Telephone Encounter (Signed)
New Message:    Please call pt's daughter. She wants to talk to you about her Mother's condition please.

## 2021-01-24 NOTE — Progress Notes (Signed)
Cardiology Clinic Note   Patient Name: Mary Strickland Date of Encounter: 01/26/2021  Primary Care Provider:  Burnard Bunting, MD Primary Cardiologist:  Peter Martinique, MD  Patient Profile    Mary Strickland 87 year old female presents to the clinic today for follow-up evaluation of her weakness and lack of energy.  Past Medical History    Past Medical History:  Diagnosis Date  . Anxiety   . Back pain   . Blood loss anemia    Mild postoperative blood loss anemia  . Dyslipidemia   . GERD (gastroesophageal reflux disease)   . History of diverticulitis of colon   . Hypertension   . Idiopathic scoliosis   . Lumbosacral spondylosis   . Mitral valve prolapse   . Neuroma, Morton's    both feet,surgery done  . Palpitations    hx. of  . Psychosis ([place])   . Right carotid bruit   . Ulcer    History of ulcers   Past Surgical History:  Procedure Laterality Date  . cataracts     both eyes  . EXCISION MORTON'S NEUROMA     both feet  . LUMBAR FUSION    . OTHER SURGICAL HISTORY     hysterectomy  . OTHER SURGICAL HISTORY     left breast biopsy/lymph node  . TOTAL KNEE ARTHROPLASTY  October 2002   right    Allergies  Allergies  Allergen Reactions  . Percocet [Oxycodone-Acetaminophen]   . Sulfamethoxazole-Trimethoprim     REACTION: SOB, itching,  . Zocor [Simvastatin]     History of Present Illness    Mary Strickland has a PMH of essential hypertension, hyperlipidemia and palpitations.  Her echocardiogram in 2009 was unremarkable.  She was last seen by Dr. Martinique on 09/15/2020.  During that time she reported she was doing okay.  She reported rare episodes of palpitations and denied chest pain, lower extremity swelling, and shortness of breath.  She contacted the nurse triage line on 01/10/2021 and reported she was feeling bad.  She reported she had no energy and felt very weak.  She had stopped taking her carvedilol 25 mg twice daily the day prior to contacting the  office.  She was advised to continue her carvedilol 12.5 mg twice daily and follow-up.  She presented to the clinic 01/18/21 for evaluation and stated she felt weak and fatigued while taking her carvedilol.  She reported that she stopped taking the medication and her symptoms resolved.  She had not been checking her blood pressure.  She presented to the clinic today with her neighbor who stated he would take her to the pharmacy to pick up her prescription and get a blood pressure cuff.  I  asked her to monitor her blood pressure daily an hour after she taking her medication.  I  also asked her to reduce and cut out as much salt in her diet as she could.  She reported she was not very physically active due to knee pain.  I will gave her the salty 6 diet sheet, discontinued her carvedilol and placed her on valsartan 40 mg daily, had her maintain her blood pressure log and follow-up with me in 1 week.  I will order a BMP in 1 week.  She presents the clinic today for follow-up evaluation states she feels well. She reports that she has had no further episodes or events with fatigue. She brings in her blood pressure readings today and they are in the 160s over 70s. On  recheck today her blood pressure is 158/82. She reports compliance with her valsartan and her aspirin. I will prescribe amlodipine 5 mg daily, have her continue on her low-salt diet, increase her physical activity, and follow-up in 1 month.  Today she denies chest pain, shortness of breath, lower extremity edema, fatigue, palpitations, melena, hematuria, hemoptysis, diaphoresis, weakness, presyncope, syncope, orthopnea, and PND.  Home Medications    Prior to Admission medications   Medication Sig Start Date End Date Taking? Authorizing Provider  aspirin 81 MG tablet Take 81 mg by mouth daily.    [provider]  diazepam (VALIUM) 5 MG tablet Take 5 mg by mouth daily. 1/2 -1 daily Patient not taking: Reported on 01/18/2021    [provider]  DULoxetine (CYMBALTA) 20 MG capsule TAKE 1 CAPSULE AT BEDTIME Patient not taking: Reported on 01/18/2021 07/22/19   Narda Amber K, DO  levothyroxine (SYNTHROID, LEVOTHROID) 25 MCG tablet Take 25 mcg by mouth daily. Patient not taking: Reported on 01/18/2021    [provider]  NONFORMULARY OR COMPOUNDED [place]: Peripheral Neuropathy cream - Bupivacaine 1%, Doxepin 3%, Gabapentin 6%, Pentoxifylline 3%, Topiramate 1%, apply 1-2 grams to affected area 3-4 times a day. Patient not taking: Reported on 01/18/2021 06/13/17   Wallene Huh, DPM  pantoprazole (PROTONIX) 40 MG tablet  07/23/19   [provider]  valsartan (DIOVAN) 40 MG tablet Take 1 tablet (40 mg total) by mouth daily. 01/18/21   Deberah Pelton, NP    Family History    Family History  Problem Relation Age of Onset  . Heart disease Father   . Heart attack Father   . Heart disease Mother   . Breast cancer Neg Hx    She indicated that her mother is deceased. She indicated that her father is deceased. She indicated that her sister is deceased. She indicated that both of her brothers are deceased. She indicated that her maternal grandmother is deceased. She indicated that her maternal grandfather is deceased. She indicated that her paternal grandmother is deceased. She indicated that her paternal grandfather is deceased. She indicated that the status of her neg hx is unknown.  Social History    Social History   Socioeconomic History  . Marital status: Widowed    Spouse name: Not on file  . Number of children: Not on file  . Years of education: Not on file  . Highest education level: Not on file  Occupational History  . Not on file  Tobacco Use  . Smoking status: Former Research scientist (life sciences)  . Smokeless tobacco: Never Used  Vaping Use  . Vaping Use: Never used  Substance and Sexual Activity  . Alcohol use: No  . Drug use: No  . Sexual activity: Not on file  Other Topics Concern  . Not  on file  Social History Narrative   She is widowed and has one daughter.    Social Determinants of Health   Financial Resource Strain: Not on file  Food Insecurity: Not on file  Transportation Needs: Not on file  Physical Activity: Not on file  Stress: Not on file  Social Connections: Not on file  Intimate Partner Violence: Not on file     Review of Systems    General:  No chills, fever, night sweats or weight changes.  Cardiovascular:  No chest pain, dyspnea on exertion, edema, orthopnea, palpitations, paroxysmal nocturnal dyspnea. Dermatological: No rash, lesions/masses Respiratory: No cough, dyspnea Urologic: No hematuria, dysuria Abdominal:  No nausea, vomiting, diarrhea, bright red blood per rectum, melena, or hematemesis Neurologic:  No visual changes, wkns, changes in mental status. All other systems reviewed and are otherwise negative except as noted above.  Physical Exam    VS:  BP (!) 158/82 (BP Location: Left Arm, Patient Position: Sitting, Cuff Size: Normal)   Pulse 89   Wt 162 lb 9.6 oz (73.8 kg)   SpO2 96%   BMI 29.74 kg/m  , BMI Body mass index is 29.74 kg/m. GEN: Well nourished, well developed, in no acute distress. HEENT: normal. Neck: Supple, no JVD, carotid bruits, or masses. Cardiac: RRR, no murmurs, rubs, or gallops. No clubbing, cyanosis, edema.  Radials/DP/PT 2+ and equal bilaterally.  Respiratory:  Respirations regular and unlabored, clear to auscultation bilaterally. GI: Soft, nontender, nondistended, BS + x 4. MS: no deformity or atrophy. Skin: warm and dry, no rash. Neuro:  Strength and sensation are intact. Psych: Normal affect.  Accessory Clinical Findings    Recent Labs: 01/25/2021: BUN 25; Creatinine, Ser 1.05; Potassium 4.4; Sodium 137   Recent Lipid Panel No results found for: CHOL, TRIG, HDL, CHOLHDL, VLDL, LDLCALC, LDLDIRECT  ECG personally reviewed by me today-none today.  EKG 09/15/2020 accelerated junctional rhythm anterior  infarct undetermined age 96 bpm  Assessment & Plan   1.  Weakness/fatigue-improved energy and less weakness.   Continue to monitor  Palpitations-denies recent episodes of accelerated or irregular heartbeats. Avoid triggers caffeine, chocolate, EtOH, dehydration etc. Maintain physical activity  Essential hypertension-BP today  158/82.   At home 160s over 70s  continue valsartan  Start amlodipine 5 mg daily Heart healthy low-sodium diet-salty 6 given Increase physical activity as tolerated Maintain blood pressure log Order BMP in 1 week  Disposition: Follow-up with Dr. Martinique or me in  1 months.   Jossie Ng. Fontaine Hehl NP-C    01/26/2021, 11:42 AM [place] [address] Office [phone] Fax [phone]  Notice: This dictation was prepared with Dragon dictation along with smaller phrase technology. Any transcriptional errors that result from this process are unintentional and may not be corrected upon review.  I spent 10 minutes examining this patient, reviewing medications, and using patient centered shared decision making involving her cardiac care.  Prior to her visit I spent greater than 20 minutes reviewing her past medical history,  medications, and prior cardiac tests.

## 2021-01-25 DIAGNOSIS — R5383 Other fatigue: Secondary | ICD-10-CM | POA: Diagnosis not present

## 2021-01-25 DIAGNOSIS — I10 Essential (primary) hypertension: Secondary | ICD-10-CM | POA: Diagnosis not present

## 2021-01-25 DIAGNOSIS — R531 Weakness: Secondary | ICD-10-CM | POA: Diagnosis not present

## 2021-01-25 DIAGNOSIS — R002 Palpitations: Secondary | ICD-10-CM | POA: Diagnosis not present

## 2021-01-25 DIAGNOSIS — Z79899 Other long term (current) drug therapy: Secondary | ICD-10-CM | POA: Diagnosis not present

## 2021-01-26 ENCOUNTER — Ambulatory Visit (INDEPENDENT_AMBULATORY_CARE_PROVIDER_SITE_OTHER): Payer: Medicare HMO | Admitting: General Practice

## 2021-01-26 ENCOUNTER — Other Ambulatory Visit: Payer: Self-pay

## 2021-01-26 ENCOUNTER — Encounter: Payer: Self-pay | Admitting: General Practice

## 2021-01-26 VITALS — BP 158/82 | HR 89 | Wt 162.6 lb

## 2021-01-26 DIAGNOSIS — I10 Essential (primary) hypertension: Secondary | ICD-10-CM

## 2021-01-26 DIAGNOSIS — R5383 Other fatigue: Secondary | ICD-10-CM

## 2021-01-26 DIAGNOSIS — R531 Weakness: Secondary | ICD-10-CM | POA: Diagnosis not present

## 2021-01-26 LAB — BASIC METABOLIC PANEL
BUN/Creatinine Ratio: 24 (ref 12–28)
BUN: 25 mg/dL (ref 8–27)
CO2: 19 mmol/L — ABNORMAL LOW (ref 20–29)
Calcium: 10 mg/dL (ref 8.7–10.3)
Chloride: 100 mmol/L (ref 96–106)
Creatinine, Ser: 1.05 mg/dL — ABNORMAL HIGH (ref 0.57–1.00)
GFR calc Af Amer: 55 mL/min/{1.73_m2} — ABNORMAL LOW (ref >59)
GFR calc non Af Amer: 48 mL/min/{1.73_m2} — ABNORMAL LOW (ref >59)
Glucose: 64 mg/dL — ABNORMAL LOW (ref 65–99)
Potassium: 4.4 mmol/L (ref 3.5–5.2)
Sodium: 137 mmol/L (ref 134–144)

## 2021-01-26 MED ORDER — AMLODIPINE BESYLATE 5 MG PO TABS: 5.0000 mg | ORAL_TABLET | Freq: Every day | ORAL | 6 refills | Status: DC

## 2021-01-26 NOTE — Patient Instructions (Addendum)
Medication Instructions:  AMLODIPINE 5MG  DAILY *If you need a refill on your cardiac medications before your next appointment, please call your pharmacy*  Lab Work:   Testing/Procedures:  NONE    NONE  Special Instructions PLEASE READ AND FOLLOW SALTY 6-ATTACHED-1,800mg  daily  Follow-Up: Your next appointment:  1 month(s) In Person with Peter Martinique, MD OR IF UNAVAILABLE [place], FNP-C   At [facility], you and your health needs are our priority.  As part of our continuing mission to provide you with exceptional heart care, we have created designated Provider Care Teams.  These Care Teams include your primary Cardiologist (physician) and Advanced Practice Providers (APPs -  Physician Assistants and Nurse Practitioners) who all work together to provide you with the care you need, when you need it.  We recommend signing up for the patient portal called "MyChart".  Sign up information is provided on this After Visit Summary.  MyChart is used to connect with patients for Virtual Visits (Telemedicine).  Patients are able to view lab/test results, encounter notes, upcoming appointments, etc.  Non-urgent messages can be sent to your provider as well.   To learn more about what you can do with MyChart, go to [email or web address].              6 SALTY THINGS TO AVOID     1,800MG  DAILY

## 2021-02-19 NOTE — Progress Notes (Deleted)
[name] Date of Birth: July 13, 1930 Medical Record #[id number]  History of Present Illness: Mrs. Aicher is seen for  followup of palpitations.  She also has a history of hypertension and hyperlipidemia. Prior echocardiogram in 2009 was unremarkable.   She was seen last month with complaint of weakness and fatigue after taking Coreg. This was discontinued and she was started on valsartan with resolution of her weakness and fatigue. BP was still elevated so she was started on amlodipine.   On follow up today she is doing OK. She has rare palpitations. No chest pain, edema, or SOB.   Current Outpatient Medications on File Prior to Visit  Medication Sig Dispense Refill  . amLODipine (NORVASC) 5 MG tablet Take 1 tablet (5 mg total) by mouth daily. 30 tablet 6  . aspirin 81 MG tablet Take 81 mg by mouth daily.    . valsartan (DIOVAN) 40 MG tablet Take 1 tablet (40 mg total) by mouth daily. 30 tablet 6   No current facility-administered medications on file prior to visit.    Allergies  Allergen Reactions  . Percocet [Oxycodone-Acetaminophen]   . Sulfamethoxazole-Trimethoprim     REACTION: SOB, itching,  . Zocor [Simvastatin]     Past Medical History:  Diagnosis Date  . Anxiety   . Back pain   . Blood loss anemia    Mild postoperative blood loss anemia  . Dyslipidemia   . GERD (gastroesophageal reflux disease)   . History of diverticulitis of colon   . Hypertension   . Idiopathic scoliosis   . Lumbosacral spondylosis   . Mitral valve prolapse   . Neuroma, Morton's    both feet,surgery done  . Palpitations    hx. of  . Psychosis ([place])   . Right carotid bruit   . Ulcer    History of ulcers    Past Surgical History:  Procedure Laterality Date  . cataracts     both eyes  . EXCISION MORTON'S NEUROMA     both feet  . LUMBAR FUSION    . OTHER SURGICAL HISTORY     hysterectomy  . OTHER SURGICAL HISTORY     left breast biopsy/lymph node  . TOTAL KNEE  ARTHROPLASTY  October 2002   right    Social History   Tobacco Use  Smoking Status Former Smoker  Smokeless Tobacco Never Used    Social History   Substance and Sexual Activity  Alcohol Use No    Family History  Problem Relation Age of Onset  . Heart disease Father   . Heart attack Father   . Heart disease Mother   . Breast cancer Neg Hx     Review of Systems: As noted in HPI.  All other systems were reviewed and are negative.  Physical Exam: There were no vitals taken for this visit. GENERAL:  Well appearing, elderly WF in NAD HEENT:  PERRL, EOMI, sclera are clear. Oropharynx is clear. NECK:  No jugular venous distention, carotid upstroke brisk and symmetric, no bruits, no thyromegaly or adenopathy LUNGS:  Clear to auscultation bilaterally CHEST:  Unremarkable HEART:  RRR,  PMI not displaced or sustained,S1 and S2 within normal limits, no S3, no S4: no clicks, no rubs, no murmurs ABD:  Soft, nontender. BS +, no masses or bruits. No hepatomegaly, no splenomegaly EXT:  2 + pulses throughout, no edema, no cyanosis no clubbing SKIN:  Warm and dry.  No rashes NEURO:  Alert and oriented x 3. Cranial nerves II through  XII intact. PSYCH:  Cognitively intact    LABORATORY DATA: Ecg today shows NSR rate 70.Marland Kitchen poor R wave progression.  I have personally reviewed and interpreted this study.  Lab Results  Component Value Date   WBC 10.4 03/31/2008   HGB 13.3 03/31/2008   HCT 39.0 03/31/2008   PLT 239 03/31/2008   GLUCOSE 64 (L) 01/25/2021   NA 137 01/25/2021   K 4.4 01/25/2021   CL 100 01/25/2021   CREATININE 1.05 (H) 01/25/2021   BUN 25 01/25/2021   CO2 19 (L) 01/25/2021   TSH 2.45 10/13/2018     Labs reviewed from 04/19/16: cholesterol 212, triglycerides 137, LDL 147, HDL 38. Creatinine 1.1. Remainder of CMET normal. CBC normal. Dated 04/17/17: Cholesterol 208, triglycerides 240, HDL 35, LDL 125. Creatinine 1.1. Otherwise CBC, chemistries and TSH normal.  Dated  05/13/19: cholesterol 191, triglycerides 147, HDL 39, LDL 123. Creatinine 1.1. ALT and TSH normal  Assessment / Plan:  1. HTN poorly controlled. Will increase Coreg to 25 mg bid.   2. Palpitations-very infrequent and benign. Continue Coreg.   Follow up in one year.

## 2021-02-23 ENCOUNTER — Ambulatory Visit: Payer: Medicare HMO | Admitting: Cardiology

## 2021-04-25 ENCOUNTER — Other Ambulatory Visit: Payer: Self-pay | Admitting: General Practice

## 2021-05-29 NOTE — Progress Notes (Deleted)
Mary Strickland Date of Birth: [birth year] Medical Record #[id number]  History of Present Illness: Mary Strickland is seen for  followup of palpitations.  She also has a history of hypertension and hyperlipidemia. Prior echocardiogram in 2009 was unremarkable.   She was seen in February by Mary Memos NP for evaluation of weakness and lack of energy. This seemed to be related to Coreg and improved with cessation. She was hypertensive and was started on Valsartan 40 mg daily and later amlodipine 5 mg daily was added.   On follow up today she is doing OK. She is confused about her medication. She was on Bystolic but this was going to cost her $400 so we switched her to Carvedilol.   Current Outpatient Medications on File Prior to Visit  Medication Sig Dispense Refill   amLODipine (NORVASC) 5 MG tablet Take 1 tablet (5 mg total) by mouth daily. 30 tablet 6   aspirin 81 MG tablet Take 81 mg by mouth daily.     valsartan (DIOVAN) 40 MG tablet TAKE 1 TABLET BY MOUTH EVERY DAY 90 tablet 3   No current facility-administered medications on file prior to visit.    Allergies  Allergen Reactions   Percocet [Oxycodone-Acetaminophen]    Sulfamethoxazole-Trimethoprim     REACTION: SOB, itching,   Zocor [Simvastatin]     Past Medical History:  Diagnosis Date   Anxiety    Back pain    Blood loss anemia    Mild postoperative blood loss anemia   Dyslipidemia    GERD (gastroesophageal reflux disease)    History of diverticulitis of colon    Hypertension    Idiopathic scoliosis    Lumbosacral spondylosis    Mitral valve prolapse    Neuroma, Morton's    both feet,surgery done   Palpitations    hx. of   Psychosis ([facility])    Right carotid bruit    Ulcer    History of ulcers    Past Surgical History:  Procedure Laterality Date   cataracts     both eyes   EXCISION MORTON'S NEUROMA     both feet   LUMBAR FUSION     OTHER SURGICAL HISTORY     hysterectomy   OTHER SURGICAL HISTORY      left breast biopsy/lymph node   TOTAL KNEE ARTHROPLASTY  October 2002   right    Social History   Tobacco Use  Smoking Status Former   Pack years: 0.00  Smokeless Tobacco Never    Social History   Substance and Sexual Activity  Alcohol Use No    Family History  Problem Relation Age of Onset   Heart disease Father    Heart attack Father    Heart disease Mother    Breast cancer Neg Hx     Review of Systems: As noted in HPI.  All other systems were reviewed and are negative.  Physical Exam: There were no vitals taken for this visit. GENERAL:  Well appearing, elderly WF in NAD HEENT:  PERRL, EOMI, sclera are clear. Oropharynx is clear. NECK:  No jugular venous distention, carotid upstroke brisk and symmetric, no bruits, no thyromegaly or adenopathy LUNGS:  Clear to auscultation bilaterally CHEST:  Unremarkable HEART:  RRR,  PMI not displaced or sustained,S1 and S2 within normal limits, no S3, no S4: no clicks, no rubs, no murmurs ABD:  Soft, nontender. BS +, no masses or bruits. No hepatomegaly, no splenomegaly EXT:  2 + pulses throughout, no edema, no  cyanosis no clubbing SKIN:  Warm and dry.  No rashes NEURO:  Alert and oriented x 3. Cranial nerves II through XII intact. PSYCH:  Cognitively intact    LABORATORY DATA: Ecg today shows NSR rate 62.. Normal. I have personally reviewed and interpreted this study.  Lab Results  Component Value Date   WBC 10.4 03/31/2008   HGB 13.3 03/31/2008   HCT 39.0 03/31/2008   PLT 239 03/31/2008   GLUCOSE 64 (L) 01/25/2021   NA 137 01/25/2021   K 4.4 01/25/2021   CL 100 01/25/2021   CREATININE 1.05 (H) 01/25/2021   BUN 25 01/25/2021   CO2 19 (L) 01/25/2021   TSH 2.45 10/13/2018    Labs reviewed from 04/19/16: cholesterol 212, triglycerides 137, LDL 147, HDL 38. Creatinine 1.1. Remainder of CMET normal. CBC normal. Dated 04/17/17: Cholesterol 208, triglycerides 240, HDL 35, LDL 125. Creatinine 1.1. Otherwise CBC,  chemistries and TSH normal.  Dated 05/13/19: cholesterol 191, triglycerides 147, HDL 39, LDL 123. Creatinine 1.1. ALT and TSH normal  Assessment / Plan:  1. HTN controlled.   2. Palpitations-very infrequent and benign. None recently  3. Weakness/fatigue related to Coreg- discontinued.   Follow up in one year.

## 2021-05-30 ENCOUNTER — Telehealth: Payer: Self-pay | Admitting: Cardiology

## 2021-05-30 NOTE — Telephone Encounter (Signed)
Patient was calling to see if her appt on Friday July 1st can be switch to a virtual because she doesn't have transportation and dont have nobody to come with. Please advise

## 2021-05-30 NOTE — Telephone Encounter (Signed)
Called patient, advised that a message was sent to MD to advise if appropriate for virtual visit and we would let her know either way.  Patient verbalized understanding.

## 2021-05-30 NOTE — Telephone Encounter (Signed)
Spoke to patient stated she has been doing good.Stated she is unable to keep her appointment 7/1 with Dr.Jordan.Stated she does not drive and has no one to bring her.Appointment rescheduled to 08/02/21 at 10:00 am.Advised to call sooner if needed.

## 2021-06-02 ENCOUNTER — Ambulatory Visit: Payer: Medicare HMO | Admitting: Cardiology

## 2021-06-02 NOTE — Telephone Encounter (Signed)
Patient is following up, requesting a virtual appointment again. She states she is not interested in the transportation services we offer and she would much rather just have a virtual appointment with Dr. Martinique. Please return call to discuss when able.

## 2021-06-02 NOTE — Telephone Encounter (Signed)
Spoke to patient Dr.Jordan he advised ok for a phone visit 08/02/21 at 10:00 am.

## 2021-06-24 ENCOUNTER — Inpatient Hospital Stay (HOSPITAL_COMMUNITY)
Admission: EM | Admit: 2021-06-24 | Discharge: 2021-08-03 | DRG: 208 | Disposition: A | Discharge status: Skilled Nursing Facility | Payer: Medicare HMO | Attending: Internal Medicine | Admitting: Internal Medicine

## 2021-06-24 ENCOUNTER — Emergency Department (HOSPITAL_COMMUNITY): Payer: Medicare HMO

## 2021-06-24 ENCOUNTER — Other Ambulatory Visit: Payer: Self-pay

## 2021-06-24 DIAGNOSIS — Z885 Allergy status to narcotic agent status: Secondary | ICD-10-CM

## 2021-06-24 DIAGNOSIS — I083 Combined rheumatic disorders of mitral, aortic and tricuspid valves: Secondary | ICD-10-CM | POA: Diagnosis present

## 2021-06-24 DIAGNOSIS — Z79899 Other long term (current) drug therapy: Secondary | ICD-10-CM

## 2021-06-24 DIAGNOSIS — Z743 Need for continuous supervision: Secondary | ICD-10-CM | POA: Diagnosis not present

## 2021-06-24 DIAGNOSIS — T82524A Displacement of infusion catheter, initial encounter: Secondary | ICD-10-CM | POA: Diagnosis not present

## 2021-06-24 DIAGNOSIS — J9690 Respiratory failure, unspecified, unspecified whether with hypoxia or hypercapnia: Secondary | ICD-10-CM

## 2021-06-24 DIAGNOSIS — B9561 Methicillin susceptible Staphylococcus aureus infection as the cause of diseases classified elsewhere: Secondary | ICD-10-CM | POA: Diagnosis present

## 2021-06-24 DIAGNOSIS — F419 Anxiety disorder, unspecified: Secondary | ICD-10-CM | POA: Diagnosis present

## 2021-06-24 DIAGNOSIS — E876 Hypokalemia: Secondary | ICD-10-CM | POA: Diagnosis not present

## 2021-06-24 DIAGNOSIS — H9190 Unspecified hearing loss, unspecified ear: Secondary | ICD-10-CM | POA: Diagnosis present

## 2021-06-24 DIAGNOSIS — Z96651 Presence of right artificial knee joint: Secondary | ICD-10-CM | POA: Diagnosis present

## 2021-06-24 DIAGNOSIS — R0603 Acute respiratory distress: Secondary | ICD-10-CM | POA: Diagnosis not present

## 2021-06-24 DIAGNOSIS — R7881 Bacteremia: Secondary | ICD-10-CM | POA: Diagnosis present

## 2021-06-24 DIAGNOSIS — Z66 Do not resuscitate: Secondary | ICD-10-CM | POA: Diagnosis present

## 2021-06-24 DIAGNOSIS — Z515 Encounter for palliative care: Secondary | ICD-10-CM

## 2021-06-24 DIAGNOSIS — T797XXA Traumatic subcutaneous emphysema, initial encounter: Secondary | ICD-10-CM | POA: Diagnosis present

## 2021-06-24 DIAGNOSIS — B370 Candidal stomatitis: Secondary | ICD-10-CM | POA: Diagnosis not present

## 2021-06-24 DIAGNOSIS — Z981 Arthrodesis status: Secondary | ICD-10-CM

## 2021-06-24 DIAGNOSIS — J9383 Other pneumothorax: Principal | ICD-10-CM | POA: Diagnosis present

## 2021-06-24 DIAGNOSIS — Z4682 Encounter for fitting and adjustment of non-vascular catheter: Secondary | ICD-10-CM

## 2021-06-24 DIAGNOSIS — Z888 Allergy status to other drugs, medicaments and biological substances status: Secondary | ICD-10-CM

## 2021-06-24 DIAGNOSIS — Z0189 Encounter for other specified special examinations: Secondary | ICD-10-CM

## 2021-06-24 DIAGNOSIS — R079 Chest pain, unspecified: Secondary | ICD-10-CM | POA: Diagnosis not present

## 2021-06-24 DIAGNOSIS — Z882 Allergy status to sulfonamides status: Secondary | ICD-10-CM

## 2021-06-24 DIAGNOSIS — I959 Hypotension, unspecified: Secondary | ICD-10-CM | POA: Diagnosis not present

## 2021-06-24 DIAGNOSIS — Z8249 Family history of ischemic heart disease and other diseases of the circulatory system: Secondary | ICD-10-CM

## 2021-06-24 DIAGNOSIS — Z452 Encounter for adjustment and management of vascular access device: Secondary | ICD-10-CM

## 2021-06-24 DIAGNOSIS — J470 Bronchiectasis with acute lower respiratory infection: Secondary | ICD-10-CM | POA: Diagnosis present

## 2021-06-24 DIAGNOSIS — R0902 Hypoxemia: Secondary | ICD-10-CM

## 2021-06-24 DIAGNOSIS — E669 Obesity, unspecified: Secondary | ICD-10-CM | POA: Diagnosis present

## 2021-06-24 DIAGNOSIS — Z6832 Body mass index (BMI) 32.0-32.9, adult: Secondary | ICD-10-CM

## 2021-06-24 DIAGNOSIS — Y712 Prosthetic and other implants, materials and accessory cardiovascular devices associated with adverse incidents: Secondary | ICD-10-CM | POA: Diagnosis not present

## 2021-06-24 DIAGNOSIS — D72829 Elevated white blood cell count, unspecified: Secondary | ICD-10-CM | POA: Diagnosis present

## 2021-06-24 DIAGNOSIS — R339 Retention of urine, unspecified: Secondary | ICD-10-CM | POA: Diagnosis not present

## 2021-06-24 DIAGNOSIS — R042 Hemoptysis: Secondary | ICD-10-CM | POA: Diagnosis not present

## 2021-06-24 DIAGNOSIS — Z6837 Body mass index (BMI) 37.0-37.9, adult: Secondary | ICD-10-CM

## 2021-06-24 DIAGNOSIS — I248 Other forms of acute ischemic heart disease: Secondary | ICD-10-CM | POA: Diagnosis present

## 2021-06-24 DIAGNOSIS — Z87891 Personal history of nicotine dependence: Secondary | ICD-10-CM

## 2021-06-24 DIAGNOSIS — U071 COVID-19: Secondary | ICD-10-CM | POA: Diagnosis present

## 2021-06-24 DIAGNOSIS — Z7982 Long term (current) use of aspirin: Secondary | ICD-10-CM

## 2021-06-24 DIAGNOSIS — R0789 Other chest pain: Secondary | ICD-10-CM | POA: Diagnosis not present

## 2021-06-24 DIAGNOSIS — R0600 Dyspnea, unspecified: Secondary | ICD-10-CM | POA: Diagnosis not present

## 2021-06-24 DIAGNOSIS — J939 Pneumothorax, unspecified: Secondary | ICD-10-CM | POA: Diagnosis present

## 2021-06-24 DIAGNOSIS — E871 Hypo-osmolality and hyponatremia: Secondary | ICD-10-CM | POA: Diagnosis not present

## 2021-06-24 DIAGNOSIS — K668 Other specified disorders of peritoneum: Secondary | ICD-10-CM | POA: Diagnosis present

## 2021-06-24 DIAGNOSIS — R059 Cough, unspecified: Secondary | ICD-10-CM

## 2021-06-24 DIAGNOSIS — R7989 Other specified abnormal findings of blood chemistry: Secondary | ICD-10-CM | POA: Diagnosis present

## 2021-06-24 DIAGNOSIS — I700 Atherosclerosis of aorta: Secondary | ICD-10-CM | POA: Diagnosis not present

## 2021-06-24 DIAGNOSIS — D649 Anemia, unspecified: Secondary | ICD-10-CM | POA: Diagnosis present

## 2021-06-24 DIAGNOSIS — J9601 Acute respiratory failure with hypoxia: Secondary | ICD-10-CM | POA: Diagnosis present

## 2021-06-24 DIAGNOSIS — Z9689 Presence of other specified functional implants: Secondary | ICD-10-CM

## 2021-06-24 DIAGNOSIS — J982 Interstitial emphysema: Secondary | ICD-10-CM | POA: Diagnosis present

## 2021-06-24 DIAGNOSIS — E785 Hyperlipidemia, unspecified: Secondary | ICD-10-CM | POA: Diagnosis present

## 2021-06-24 DIAGNOSIS — K219 Gastro-esophageal reflux disease without esophagitis: Secondary | ICD-10-CM | POA: Diagnosis present

## 2021-06-24 DIAGNOSIS — R9389 Abnormal findings on diagnostic imaging of other specified body structures: Secondary | ICD-10-CM | POA: Diagnosis present

## 2021-06-24 DIAGNOSIS — J1282 Pneumonia due to coronavirus disease 2019: Secondary | ICD-10-CM | POA: Diagnosis not present

## 2021-06-24 DIAGNOSIS — R0602 Shortness of breath: Secondary | ICD-10-CM | POA: Diagnosis not present

## 2021-06-24 DIAGNOSIS — R7401 Elevation of levels of liver transaminase levels: Secondary | ICD-10-CM | POA: Diagnosis present

## 2021-06-24 DIAGNOSIS — R000 Tachycardia, unspecified: Secondary | ICD-10-CM | POA: Diagnosis not present

## 2021-06-24 DIAGNOSIS — I10 Essential (primary) hypertension: Secondary | ICD-10-CM | POA: Diagnosis present

## 2021-06-24 LAB — COMPREHENSIVE METABOLIC PANEL
ALT: 21 U/L (ref 0–44)
AST: 27 U/L (ref 15–41)
Albumin: 4.5 g/dL (ref 3.5–5.0)
Alkaline Phosphatase: 41 U/L (ref 38–126)
Anion gap: 11 (ref 5–15)
BUN: 23 mg/dL (ref 8–23)
CO2: 23 mmol/L (ref 22–32)
Calcium: 9 mg/dL (ref 8.9–10.3)
Chloride: 103 mmol/L (ref 98–111)
Creatinine, Ser: 0.97 mg/dL (ref 0.44–1.00)
GFR, Estimated: 56 mL/min — ABNORMAL LOW (ref >60)
Glucose, Bld: 163 mg/dL — ABNORMAL HIGH (ref 70–99)
Potassium: 3.7 mmol/L (ref 3.5–5.1)
Sodium: 137 mmol/L (ref 135–145)
Total Bilirubin: 0.5 mg/dL (ref 0.3–1.2)
Total Protein: 7.9 g/dL (ref 6.5–8.1)

## 2021-06-24 LAB — CBC WITH DIFFERENTIAL/PLATELET
Abs Immature Granulocytes: 0.26 10*3/uL — ABNORMAL HIGH (ref 0.00–0.07)
Basophils Absolute: 0.1 10*3/uL (ref 0.0–0.1)
Basophils Relative: 0 %
Eosinophils Absolute: 0.1 10*3/uL (ref 0.0–0.5)
Eosinophils Relative: 0 %
HCT: 41 % (ref 36.0–46.0)
Hemoglobin: 13.7 g/dL (ref 12.0–15.0)
Immature Granulocytes: 1 %
Lymphocytes Relative: 13 %
Lymphs Abs: 2.9 10*3/uL (ref 0.7–4.0)
MCH: 30.3 pg (ref 26.0–34.0)
MCHC: 33.4 g/dL (ref 30.0–36.0)
MCV: 90.7 fL (ref 80.0–100.0)
Monocytes Absolute: 1.1 10*3/uL — ABNORMAL HIGH (ref 0.1–1.0)
Monocytes Relative: 5 %
Neutro Abs: 17.8 10*3/uL — ABNORMAL HIGH (ref 1.7–7.7)
Neutrophils Relative %: 81 %
Platelets: 299 10*3/uL (ref 150–400)
RBC: 4.52 MIL/uL (ref 3.87–5.11)
RDW: 13.8 % (ref 11.5–15.5)
WBC: 22.2 10*3/uL — ABNORMAL HIGH (ref 4.0–10.5)
nRBC: 0 % (ref 0.0–0.2)

## 2021-06-24 LAB — PROTIME-INR
INR: 0.9 (ref 0.8–1.2)
Prothrombin Time: 12.6 seconds (ref 11.4–15.2)

## 2021-06-24 MED ORDER — ETOMIDATE 2 MG/ML IV SOLN
INTRAVENOUS | Status: AC
  Filled 2021-06-24: qty 20

## 2021-06-24 MED ORDER — FENTANYL BOLUS VIA INFUSION
25.0000 ug | INTRAVENOUS | Status: DC | PRN
  Administered 2021-06-25 (×2): 25 ug via INTRAVENOUS
  Filled 2021-06-24: qty 25

## 2021-06-24 MED ORDER — SODIUM CHLORIDE 0.9 % IV BOLUS (SEPSIS)
500.0000 mL | Freq: Once | INTRAVENOUS | Status: AC
  Administered 2021-06-24: 500 mL via INTRAVENOUS

## 2021-06-24 MED ORDER — PROPOFOL 1000 MG/100ML IV EMUL
0.0000 ug/kg/min | INTRAVENOUS | Status: DC
  Administered 2021-06-24: 5 ug/kg/min via INTRAVENOUS
  Filled 2021-06-24: qty 100

## 2021-06-24 MED ORDER — FENTANYL 2500MCG IN NS 250ML (10MCG/ML) PREMIX INFUSION
25.0000 ug/h | INTRAVENOUS | Status: DC
  Administered 2021-06-24: 50 ug/h via INTRAVENOUS
  Filled 2021-06-24: qty 250

## 2021-06-24 MED ORDER — SODIUM CHLORIDE 0.9 % IV SOLN
1000.0000 mL | INTRAVENOUS | Status: DC
Start: 2021-06-24 — End: 2021-06-27
  Administered 2021-06-24: 1000 mL via INTRAVENOUS

## 2021-06-24 MED ORDER — FENTANYL CITRATE (PF) 100 MCG/2ML IJ SOLN
50.0000 ug | Freq: Once | INTRAMUSCULAR | Status: AC
  Administered 2021-06-24: 50 ug via INTRAVENOUS
  Filled 2021-06-24: qty 2

## 2021-06-24 MED ORDER — SUCCINYLCHOLINE CHLORIDE 200 MG/10ML IV SOSY
PREFILLED_SYRINGE | INTRAVENOUS | Status: AC
  Filled 2021-06-24: qty 10

## 2021-06-24 NOTE — ED Triage Notes (Addendum)
An hour ago patient woke up and  felt like she needed to cough. Patient cough up blood. Patient has no hx of respiratory problems. Lungs sound rhonchi throughout. Patient on Non rebreather 91% on 15 L.bbp 210/100 HR-100.

## 2021-06-24 NOTE — ED Provider Notes (Signed)
[facility] DEPT Provider Note   CSN: [id number] Arrival date & time: 06/24/21  2201     History Chief Complaint  Patient presents with   Shortness of Breath   Hemoptysis    Mary Strickland is a 87 y.o. female.   Shortness of Breath  Patient presents to the ED for evaluation of acute shortness of breath and hemoptysis.  Patient states about an hour prior to arrival she woke up and felt the urge to cough.  She ended up coughing up bright red blood.  Patient ended up calling the ambulance.  They noted she had coarse breath sounds.  This started on oxygen.  Patient was also noted to be hypertensive.  Patient denies any of any trouble with chest pain.  She has not had any abdominal pain.  She denies any history of chronic lung problems.  Past Medical History:  Diagnosis Date   Anxiety    Back pain    Blood loss anemia    Mild postoperative blood loss anemia   Dyslipidemia    GERD (gastroesophageal reflux disease)    History of diverticulitis of colon    Hypertension    Idiopathic scoliosis    Lumbosacral spondylosis    Mitral valve prolapse    Neuroma, Morton's    both feet,surgery done   Palpitations    hx. of   Psychosis ([place])    Right carotid bruit    Ulcer    History of ulcers    Patient Active Problem List   Diagnosis Date Noted   HTN (hypertension) 12/18/2011   Dyslipidemia    Palpitations    GERD (gastroesophageal reflux disease)    Neuroma, Morton's    Back pain    Right carotid bruit     Past Surgical History:  Procedure Laterality Date   cataracts     both eyes   EXCISION MORTON'S NEUROMA     both feet   LUMBAR FUSION     OTHER SURGICAL HISTORY     hysterectomy   OTHER SURGICAL HISTORY     left breast biopsy/lymph node   TOTAL KNEE ARTHROPLASTY  October 2002   right     OB History   No obstetric history on file.     Family History  Problem Relation Age of Onset   Heart disease Father    Heart attack  Father    Heart disease Mother    Breast cancer Neg Hx     Social History   Tobacco Use   Smoking status: Former   Smokeless tobacco: Never  Scientific laboratory technician Use: Never used  Substance Use Topics   Alcohol use: No   Drug use: No    Home Medications Prior to Admission medications   Medication Sig Start Date End Date Taking? Authorizing Provider  amLODipine (NORVASC) 5 MG tablet Take 1 tablet (5 mg total) by mouth daily. 01/26/21 04/26/21  Deberah Pelton, NP  aspirin 81 MG tablet Take 81 mg by mouth daily.    [provider]  valsartan (DIOVAN) 40 MG tablet TAKE 1 TABLET BY MOUTH EVERY DAY 04/25/21   Martinique, Peter M, MD    Allergies    Percocet [oxycodone-acetaminophen], Sulfamethoxazole-trimethoprim, and Zocor [simvastatin]  Review of Systems   Review of Systems  Respiratory:  Positive for shortness of breath.   All other systems reviewed and are negative.  Physical Exam Updated Vital Signs BP (!) 158/113 (BP Location: Right Arm)  Pulse (!) 104   Temp 97.7 F (36.5 C) (Oral)   Resp (!) 22   Ht 1.575 m ('5\' 2"'$ )   Wt 73.5 kg   SpO2 97%   BMI 29.63 kg/m   Physical Exam Vitals and nursing note reviewed.  Constitutional:      Appearance: She is well-developed. She is ill-appearing.  HENT:     Head: Normocephalic and atraumatic.     Right Ear: External ear normal.     Left Ear: External ear normal.  Eyes:     General: No scleral icterus.       Right eye: No discharge.        Left eye: No discharge.     Conjunctiva/sclera: Conjunctivae normal.  Neck:     Trachea: No tracheal deviation.  Cardiovascular:     Rate and Rhythm: Normal rate and regular rhythm.  Pulmonary:     Effort: Accessory muscle usage present. No respiratory distress.     Breath sounds: No stridor. Rhonchi present. No wheezing or rales.  Abdominal:     General: Bowel sounds are normal. There is no distension.     Palpations: Abdomen is soft.     Tenderness: There is no abdominal  tenderness. There is no guarding or rebound.  Musculoskeletal:        General: No tenderness or deformity.     Cervical back: Neck supple.  Skin:    General: Skin is warm and dry.     Findings: No rash.  Neurological:     General: No focal deficit present.     Mental Status: She is alert.     Cranial Nerves: No cranial nerve deficit (no facial droop, extraocular movements intact, no slurred speech).     Sensory: No sensory deficit.     Motor: No abnormal muscle tone or seizure activity.     Coordination: Coordination normal.  Psychiatric:        Mood and Affect: Mood normal.    ED Results / Procedures / Treatments   Labs (all labs ordered are listed, but only abnormal results are displayed) Labs Reviewed  CBC WITH DIFFERENTIAL/PLATELET - Abnormal; Notable for the following components:      Result Value   WBC 22.2 (*)    Neutro Abs 17.8 (*)    Monocytes Absolute 1.1 (*)    Abs Immature Granulocytes 0.26 (*)    All other components within normal limits  COMPREHENSIVE METABOLIC PANEL - Abnormal; Notable for the following components:   Glucose, Bld 163 (*)    GFR, Estimated 56 (*)    All other components within normal limits  RESP PANEL BY RT-PCR (FLU A&B, COVID) [id number]  PROTIME-INR  TRIGLYCERIDES  BLOOD GAS, ARTERIAL  TYPE AND SCREEN    EKG EKG Interpretation  Date/Time:  Saturday June 24 2021 22:25:58 EDT Ventricular Rate:  108 PR Interval:  210 QRS Duration: 85 QT Interval:  325 QTC Calculation: 436 R Axis:   -13 Text Interpretation: Sinus tachycardia Borderline prolonged PR interval Repol abnrm suggests ischemia, lateral leads No significant change since last tracing Confirmed by Dorie Rank [phone]) on 06/24/2021 10:46:13 PM  Radiology DG Chest 2 View  Result Date: 06/24/2021 CLINICAL DATA:  87 year old female with shortness of breath. EXAM: CHEST - 2 VIEW COMPARISON:  Chest radiograph dated 03/31/2008. FINDINGS: Bibasilar linear atelectasis/scarring. No focal  consolidation, pleural effusion, or pneumothorax. The cardiac silhouette is within limits. Atherosclerotic calcification of the aorta. Lower thoracic spinal stimulator. No acute osseous pathology. Degenerative  changes of the spine. IMPRESSION: No active cardiopulmonary disease. Electronically Signed   By: Anner Crete M.D.   On: 06/24/2021 22:58    Procedures .Critical Care  Date/Time: 06/25/2021 12:02 AM Performed by: Dorie Rank, MD Authorized by: Dorie Rank, MD   Critical care provider statement:    Critical care time (minutes):  45   Critical care was time spent personally by me on the following activities:  Discussions with consultants, evaluation of patient's response to treatment, examination of patient, ordering and performing treatments and interventions, ordering and review of laboratory studies, ordering and review of radiographic studies, pulse oximetry, re-evaluation of patient's condition, obtaining history from patient or surrogate and review of old charts Procedure Name: Intubation Date/Time: 06/25/2021 12:02 AM Performed by: Dorie Rank, MD Pre-anesthesia Checklist: Patient identified, Patient being monitored, Emergency Drugs available, Timeout performed and Suction available Oxygen Delivery Method: Non-rebreather mask Preoxygenation: Pre-oxygenation with 100% oxygen Induction Type: Rapid sequence Ventilation: Mask ventilation without difficulty Laryngoscope Size: Glidescope Tube size: 7.5 mm Number of attempts: 1 Placement Confirmation: ETT inserted through vocal cords under direct vision, CO2 detector and Breath sounds checked- equal and bilateral Dental Injury: Teeth and Oropharynx as per pre-operative assessment  Comments: Large amount of bright red blood noted coming from below the cords,   Patient presented  Medications Ordered in ED Medications  sodium chloride 0.9 % bolus 500 mL (500 mLs Intravenous New Bag/Given 06/24/21 2304)    Followed by  0.9 %  sodium  chloride infusion (1,000 mLs Intravenous New Bag/Given 06/24/21 2343)  succinylcholine (ANECTINE) 200 MG/10ML syringe (has no administration in time range)  etomidate (AMIDATE) 2 MG/ML injection (has no administration in time range)  fentaNYL (SUBLIMAZE) injection 50 mcg (has no administration in time range)  fentaNYL 2572mg in NS 2538m(1027mml) infusion-PREMIX (has no administration in time range)  fentaNYL (SUBLIMAZE) bolus via infusion 25 mcg (has no administration in time range)  propofol (DIPRIVAN) 1000 MG/100ML infusion (has no administration in time range)    ED Course  I have reviewed the triage vital signs and the nursing notes.  Pertinent labs & imaging results that were available during my care of the patient were reviewed by me and considered in my medical decision making (see chart for details).  Clinical Course as of 06/24/21 2344  Sat Jun 24, 2021  2305 Chest x-ray without acute findings [JK]  2334 Patient became acutely more short of breath.  Patient was agitated.  Grabbing at the oxygen mask.  Proceeded with intubation for her acute distress [JK]    Clinical Course User Index [JK] KnaDorie RankD   MDM Rules/Calculators/A&P                           Patient presented to the with acute hemoptysis and respiratory difficulty.  Patient initially was oxygenating with supplemental oxygen and breathing without severe distress.  However shortly after arrival she became more acutely short of breath.  Patient has significant air hunger.  She was not responding to supplemental oxygen and started to desat.  Patient was intubated.  Large amount of blood noted below the vocal cords.  Patient was suctioned intubated without difficulty.  Chest x-ray does not show any obvious abnormality.  We will proceed with CT angiogram.  Consult with critical care for admission.  I did speak with the patient's daughter and informed her of the critical state.   Final Clinical Impression(s) / ED  Diagnoses Final diagnoses:  Acute respiratory distress  Hemoptysis     Dorie Rank, MD 06/25/21 0004

## 2021-06-24 NOTE — ED Notes (Signed)
Called and spoke with granddaughter of pt to give update.

## 2021-06-24 NOTE — ED Notes (Signed)
Patient transported to X-ray 

## 2021-06-24 NOTE — ED Notes (Signed)
Pt daughter Mrs Hassell Done request call for update [phone]

## 2021-06-25 ENCOUNTER — Inpatient Hospital Stay (HOSPITAL_COMMUNITY): Payer: Medicare HMO

## 2021-06-25 ENCOUNTER — Emergency Department (HOSPITAL_COMMUNITY): Payer: Medicare HMO

## 2021-06-25 ENCOUNTER — Encounter (HOSPITAL_COMMUNITY): Payer: Self-pay

## 2021-06-25 DIAGNOSIS — E669 Obesity, unspecified: Secondary | ICD-10-CM | POA: Diagnosis present

## 2021-06-25 DIAGNOSIS — I6523 Occlusion and stenosis of bilateral carotid arteries: Secondary | ICD-10-CM | POA: Diagnosis not present

## 2021-06-25 DIAGNOSIS — R0602 Shortness of breath: Secondary | ICD-10-CM | POA: Diagnosis not present

## 2021-06-25 DIAGNOSIS — R7881 Bacteremia: Secondary | ICD-10-CM | POA: Diagnosis not present

## 2021-06-25 DIAGNOSIS — I248 Other forms of acute ischemic heart disease: Secondary | ICD-10-CM | POA: Diagnosis not present

## 2021-06-25 DIAGNOSIS — R778 Other specified abnormalities of plasma proteins: Secondary | ICD-10-CM | POA: Diagnosis not present

## 2021-06-25 DIAGNOSIS — J9601 Acute respiratory failure with hypoxia: Secondary | ICD-10-CM

## 2021-06-25 DIAGNOSIS — T82524A Displacement of infusion catheter, initial encounter: Secondary | ICD-10-CM | POA: Diagnosis not present

## 2021-06-25 DIAGNOSIS — I6503 Occlusion and stenosis of bilateral vertebral arteries: Secondary | ICD-10-CM | POA: Diagnosis not present

## 2021-06-25 DIAGNOSIS — D72829 Elevated white blood cell count, unspecified: Secondary | ICD-10-CM | POA: Diagnosis present

## 2021-06-25 DIAGNOSIS — R062 Wheezing: Secondary | ICD-10-CM | POA: Diagnosis not present

## 2021-06-25 DIAGNOSIS — K5730 Diverticulosis of large intestine without perforation or abscess without bleeding: Secondary | ICD-10-CM | POA: Diagnosis not present

## 2021-06-25 DIAGNOSIS — Z4682 Encounter for fitting and adjustment of non-vascular catheter: Secondary | ICD-10-CM | POA: Diagnosis not present

## 2021-06-25 DIAGNOSIS — K668 Other specified disorders of peritoneum: Secondary | ICD-10-CM

## 2021-06-25 DIAGNOSIS — J439 Emphysema, unspecified: Secondary | ICD-10-CM | POA: Diagnosis not present

## 2021-06-25 DIAGNOSIS — J9311 Primary spontaneous pneumothorax: Secondary | ICD-10-CM | POA: Diagnosis not present

## 2021-06-25 DIAGNOSIS — I700 Atherosclerosis of aorta: Secondary | ICD-10-CM | POA: Diagnosis not present

## 2021-06-25 DIAGNOSIS — R0603 Acute respiratory distress: Secondary | ICD-10-CM | POA: Diagnosis not present

## 2021-06-25 DIAGNOSIS — Z452 Encounter for adjustment and management of vascular access device: Secondary | ICD-10-CM | POA: Diagnosis not present

## 2021-06-25 DIAGNOSIS — B370 Candidal stomatitis: Secondary | ICD-10-CM | POA: Diagnosis not present

## 2021-06-25 DIAGNOSIS — T8249XA Other complication of vascular dialysis catheter, initial encounter: Secondary | ICD-10-CM | POA: Diagnosis not present

## 2021-06-25 DIAGNOSIS — R9389 Abnormal findings on diagnostic imaging of other specified body structures: Secondary | ICD-10-CM | POA: Diagnosis not present

## 2021-06-25 DIAGNOSIS — Z515 Encounter for palliative care: Secondary | ICD-10-CM | POA: Diagnosis not present

## 2021-06-25 DIAGNOSIS — R042 Hemoptysis: Secondary | ICD-10-CM

## 2021-06-25 DIAGNOSIS — Y712 Prosthetic and other implants, materials and accessory cardiovascular devices associated with adverse incidents: Secondary | ICD-10-CM | POA: Diagnosis not present

## 2021-06-25 DIAGNOSIS — J939 Pneumothorax, unspecified: Secondary | ICD-10-CM | POA: Diagnosis present

## 2021-06-25 DIAGNOSIS — J1282 Pneumonia due to coronavirus disease 2019: Secondary | ICD-10-CM | POA: Diagnosis not present

## 2021-06-25 DIAGNOSIS — I959 Hypotension, unspecified: Secondary | ICD-10-CM | POA: Diagnosis not present

## 2021-06-25 DIAGNOSIS — J189 Pneumonia, unspecified organism: Secondary | ICD-10-CM | POA: Diagnosis not present

## 2021-06-25 DIAGNOSIS — Z6837 Body mass index (BMI) 37.0-37.9, adult: Secondary | ICD-10-CM | POA: Diagnosis not present

## 2021-06-25 DIAGNOSIS — J9690 Respiratory failure, unspecified, unspecified whether with hypoxia or hypercapnia: Secondary | ICD-10-CM | POA: Diagnosis not present

## 2021-06-25 DIAGNOSIS — J90 Pleural effusion, not elsewhere classified: Secondary | ICD-10-CM | POA: Diagnosis not present

## 2021-06-25 DIAGNOSIS — J9811 Atelectasis: Secondary | ICD-10-CM | POA: Diagnosis not present

## 2021-06-25 DIAGNOSIS — E871 Hypo-osmolality and hyponatremia: Secondary | ICD-10-CM | POA: Diagnosis not present

## 2021-06-25 DIAGNOSIS — T797XXA Traumatic subcutaneous emphysema, initial encounter: Secondary | ICD-10-CM | POA: Diagnosis present

## 2021-06-25 DIAGNOSIS — Z882 Allergy status to sulfonamides status: Secondary | ICD-10-CM | POA: Diagnosis not present

## 2021-06-25 DIAGNOSIS — R7989 Other specified abnormal findings of blood chemistry: Secondary | ICD-10-CM | POA: Diagnosis present

## 2021-06-25 DIAGNOSIS — R339 Retention of urine, unspecified: Secondary | ICD-10-CM | POA: Diagnosis not present

## 2021-06-25 DIAGNOSIS — D649 Anemia, unspecified: Secondary | ICD-10-CM | POA: Diagnosis present

## 2021-06-25 DIAGNOSIS — U071 COVID-19: Secondary | ICD-10-CM | POA: Diagnosis not present

## 2021-06-25 DIAGNOSIS — Z7189 Other specified counseling: Secondary | ICD-10-CM | POA: Diagnosis not present

## 2021-06-25 DIAGNOSIS — Y711 Therapeutic (nonsurgical) and rehabilitative cardiovascular devices associated with adverse incidents: Secondary | ICD-10-CM | POA: Diagnosis not present

## 2021-06-25 DIAGNOSIS — B9561 Methicillin susceptible Staphylococcus aureus infection as the cause of diseases classified elsewhere: Secondary | ICD-10-CM | POA: Diagnosis not present

## 2021-06-25 DIAGNOSIS — I10 Essential (primary) hypertension: Secondary | ICD-10-CM | POA: Diagnosis not present

## 2021-06-25 DIAGNOSIS — J982 Interstitial emphysema: Secondary | ICD-10-CM | POA: Diagnosis not present

## 2021-06-25 DIAGNOSIS — I083 Combined rheumatic disorders of mitral, aortic and tricuspid valves: Secondary | ICD-10-CM | POA: Diagnosis present

## 2021-06-25 DIAGNOSIS — M47812 Spondylosis without myelopathy or radiculopathy, cervical region: Secondary | ICD-10-CM | POA: Diagnosis not present

## 2021-06-25 DIAGNOSIS — R918 Other nonspecific abnormal finding of lung field: Secondary | ICD-10-CM | POA: Diagnosis not present

## 2021-06-25 DIAGNOSIS — S199XXA Unspecified injury of neck, initial encounter: Secondary | ICD-10-CM | POA: Diagnosis not present

## 2021-06-25 DIAGNOSIS — J9383 Other pneumothorax: Secondary | ICD-10-CM | POA: Diagnosis not present

## 2021-06-25 DIAGNOSIS — Z6832 Body mass index (BMI) 32.0-32.9, adult: Secondary | ICD-10-CM | POA: Diagnosis not present

## 2021-06-25 DIAGNOSIS — R059 Cough, unspecified: Secondary | ICD-10-CM | POA: Diagnosis not present

## 2021-06-25 DIAGNOSIS — J470 Bronchiectasis with acute lower respiratory infection: Secondary | ICD-10-CM | POA: Diagnosis not present

## 2021-06-25 DIAGNOSIS — Z66 Do not resuscitate: Secondary | ICD-10-CM | POA: Diagnosis not present

## 2021-06-25 LAB — BLOOD GAS, ARTERIAL
Acid-base deficit: 10.3 mmol/L — ABNORMAL HIGH (ref 0.0–2.0)
Acid-base deficit: 4.8 mmol/L — ABNORMAL HIGH (ref 0.0–2.0)
Acid-base deficit: 5.2 mmol/L — ABNORMAL HIGH (ref 0.0–2.0)
Bicarbonate: 20.7 mmol/L (ref 20.0–28.0)
Bicarbonate: 20.8 mmol/L (ref 20.0–28.0)
Bicarbonate: 21.9 mmol/L (ref 20.0–28.0)
Drawn by: 270211
FIO2: 100
FIO2: 50
FIO2: 60
MECHVT: 400 mL
O2 Saturation: 93.7 %
O2 Saturation: 96.5 %
O2 Saturation: 98.2 %
PEEP: 5 cmH2O
Patient temperature: 37.2
Patient temperature: 97.7
Patient temperature: 98.6
RATE: 15 resp/min
pCO2 arterial: 42.1 mmHg (ref 32.0–48.0)
pCO2 arterial: 45.5 mmHg (ref 32.0–48.0)
pCO2 arterial: 80.2 mmHg (ref 32.0–48.0)
pH, Arterial: 7.06 — CL (ref 7.350–7.450)
pH, Arterial: 7.283 — ABNORMAL LOW (ref 7.350–7.450)
pH, Arterial: 7.312 — ABNORMAL LOW (ref 7.350–7.450)
pO2, Arterial: 221 mmHg — ABNORMAL HIGH (ref 83.0–108.0)
pO2, Arterial: 74.6 mmHg — ABNORMAL LOW (ref 83.0–108.0)
pO2, Arterial: 85.5 mmHg (ref 83.0–108.0)

## 2021-06-25 LAB — RESP PANEL BY RT-PCR (FLU A&B, COVID) ARPGX2
Influenza A by PCR: NEGATIVE
Influenza B by PCR: NEGATIVE
SARS Coronavirus 2 by RT PCR: NEGATIVE

## 2021-06-25 LAB — URINALYSIS, ROUTINE W REFLEX MICROSCOPIC
Bacteria, UA: NONE SEEN
Bilirubin Urine: NEGATIVE
Glucose, UA: NEGATIVE mg/dL
Ketones, ur: NEGATIVE mg/dL
Nitrite: NEGATIVE
Protein, ur: NEGATIVE mg/dL
Specific Gravity, Urine: 1.039 — ABNORMAL HIGH (ref 1.005–1.030)
WBC, UA: >50 WBC/hpf — ABNORMAL HIGH (ref 0–5)
pH: 6 (ref 5.0–8.0)

## 2021-06-25 LAB — BASIC METABOLIC PANEL
Anion gap: 11 (ref 5–15)
BUN: 23 mg/dL (ref 8–23)
CO2: 20 mmol/L — ABNORMAL LOW (ref 22–32)
Calcium: 8.2 mg/dL — ABNORMAL LOW (ref 8.9–10.3)
Chloride: 105 mmol/L (ref 98–111)
Creatinine, Ser: 1.05 mg/dL — ABNORMAL HIGH (ref 0.44–1.00)
GFR, Estimated: 51 mL/min — ABNORMAL LOW (ref >60)
Glucose, Bld: 132 mg/dL — ABNORMAL HIGH (ref 70–99)
Potassium: 4.4 mmol/L (ref 3.5–5.1)
Sodium: 136 mmol/L (ref 135–145)

## 2021-06-25 LAB — TYPE AND SCREEN
ABO/RH(D): A POS
Antibody Screen: NEGATIVE

## 2021-06-25 LAB — CBC
HCT: 37.6 % (ref 36.0–46.0)
Hemoglobin: 12.3 g/dL (ref 12.0–15.0)
MCH: 30.6 pg (ref 26.0–34.0)
MCHC: 32.7 g/dL (ref 30.0–36.0)
MCV: 93.5 fL (ref 80.0–100.0)
Platelets: 281 10*3/uL (ref 150–400)
RBC: 4.02 MIL/uL (ref 3.87–5.11)
RDW: 14.2 % (ref 11.5–15.5)
WBC: 28.5 10*3/uL — ABNORMAL HIGH (ref 4.0–10.5)
nRBC: 0 % (ref 0.0–0.2)

## 2021-06-25 LAB — TRIGLYCERIDES: Triglycerides: 91 mg/dL (ref <150)

## 2021-06-25 LAB — MRSA NEXT GEN BY PCR, NASAL: MRSA by PCR Next Gen: NOT DETECTED

## 2021-06-25 LAB — TROPONIN I (HIGH SENSITIVITY)
Troponin I (High Sensitivity): 17 ng/L (ref <18)
Troponin I (High Sensitivity): 443 ng/L (ref <18)

## 2021-06-25 LAB — MAGNESIUM: Magnesium: 1.8 mg/dL (ref 1.7–2.4)

## 2021-06-25 LAB — BRAIN NATRIURETIC PEPTIDE: B Natriuretic Peptide: 100.7 pg/mL — ABNORMAL HIGH (ref 0.0–100.0)

## 2021-06-25 MED ORDER — METHYLPREDNISOLONE SODIUM SUCC 125 MG IJ SOLR
125.0000 mg | Freq: Once | INTRAMUSCULAR | Status: AC
  Administered 2021-06-25: 125 mg via INTRAVENOUS
  Filled 2021-06-25: qty 2

## 2021-06-25 MED ORDER — POLYETHYLENE GLYCOL 3350 17 G PO PACK: 17.0000 g | PACK | Freq: Every day | ORAL | Status: DC | PRN

## 2021-06-25 MED ORDER — POLYETHYLENE GLYCOL 3350 17 G PO PACK: 17.0000 g | PACK | Freq: Every day | ORAL | Status: DC

## 2021-06-25 MED ORDER — SODIUM CHLORIDE 0.9 % IV SOLN
250.0000 mL | INTRAVENOUS | Status: DC
  Administered 2021-06-25: 250 mL via INTRAVENOUS

## 2021-06-25 MED ORDER — IOHEXOL 9 MG/ML PO SOLN
ORAL | Status: AC
  Administered 2021-06-25: 500 mL
  Filled 2021-06-25: qty 1000

## 2021-06-25 MED ORDER — DOCUSATE SODIUM 100 MG PO CAPS: 100.0000 mg | ORAL_CAPSULE | Freq: Two times a day (BID) | ORAL | Status: DC | PRN

## 2021-06-25 MED ORDER — ONDANSETRON HCL 4 MG/2ML IJ SOLN
4.0000 mg | Freq: Four times a day (QID) | INTRAMUSCULAR | Status: DC | PRN
  Administered 2021-06-26 – 2021-06-27 (×2): 4 mg via INTRAVENOUS
  Filled 2021-06-25 (×2): qty 2

## 2021-06-25 MED ORDER — MIDAZOLAM HCL 2 MG/2ML IJ SOLN
2.0000 mg | Freq: Once | INTRAMUSCULAR | Status: AC
  Administered 2021-06-25: 2 mg via INTRAVENOUS
  Filled 2021-06-25: qty 2

## 2021-06-25 MED ORDER — SUCCINYLCHOLINE CHLORIDE 20 MG/ML IJ SOLN
INTRAMUSCULAR | Status: AC | PRN
  Administered 2021-06-24: 120 mg via INTRAVENOUS

## 2021-06-25 MED ORDER — ETOMIDATE 2 MG/ML IV SOLN
INTRAVENOUS | Status: AC | PRN
  Administered 2021-06-24: 20 mg via INTRAVENOUS

## 2021-06-25 MED ORDER — PROPOFOL 1000 MG/100ML IV EMUL
0.0000 ug/kg/min | INTRAVENOUS | Status: DC
  Administered 2021-06-25: 40 ug/kg/min via INTRAVENOUS
  Administered 2021-06-25 (×3): 30 ug/kg/min via INTRAVENOUS
  Administered 2021-06-25: 20 ug/kg/min via INTRAVENOUS
  Administered 2021-06-25: 5 ug/kg/min via INTRAVENOUS
  Administered 2021-06-26: 30 ug/kg/min via INTRAVENOUS
  Filled 2021-06-25 (×5): qty 100

## 2021-06-25 MED ORDER — IOHEXOL 9 MG/ML PO SOLN
500.0000 mL | ORAL | Status: AC
  Administered 2021-06-25 (×2): 500 mL via ORAL

## 2021-06-25 MED ORDER — NOREPINEPHRINE 4 MG/250ML-% IV SOLN
2.0000 ug/min | INTRAVENOUS | Status: DC
  Administered 2021-06-25: 8 ug/min via INTRAVENOUS
  Administered 2021-06-25: 2 ug/min via INTRAVENOUS
  Filled 2021-06-25: qty 250

## 2021-06-25 MED ORDER — CHLORHEXIDINE GLUCONATE 0.12% ORAL RINSE (MEDLINE KIT)
15.0000 mL | Freq: Two times a day (BID) | OROMUCOSAL | Status: DC
  Administered 2021-06-25 – 2021-06-26 (×3): 15 mL via OROMUCOSAL

## 2021-06-25 MED ORDER — IPRATROPIUM-ALBUTEROL 0.5-2.5 (3) MG/3ML IN SOLN
3.0000 mL | Freq: Four times a day (QID) | RESPIRATORY_TRACT | Status: DC
Start: 2021-06-25 — End: 2021-06-28
  Administered 2021-06-25 – 2021-06-28 (×11): 3 mL via RESPIRATORY_TRACT
  Filled 2021-06-25 (×12): qty 3

## 2021-06-25 MED ORDER — DOCUSATE SODIUM 50 MG/5ML PO LIQD: 100.0000 mg | Freq: Two times a day (BID) | ORAL | Status: DC

## 2021-06-25 MED ORDER — CHLORHEXIDINE GLUCONATE CLOTH 2 % EX PADS
6.0000 | MEDICATED_PAD | Freq: Every day | CUTANEOUS | Status: DC
  Administered 2021-06-26 – 2021-07-25 (×27): 6 via TOPICAL

## 2021-06-25 MED ORDER — LACTATED RINGERS IV BOLUS
1000.0000 mL | Freq: Once | INTRAVENOUS | Status: AC
  Administered 2021-06-25: 1000 mL via INTRAVENOUS

## 2021-06-25 MED ORDER — PANTOPRAZOLE SODIUM 40 MG IV SOLR
40.0000 mg | INTRAVENOUS | Status: DC
  Administered 2021-06-25: 40 mg via INTRAVENOUS
  Filled 2021-06-25: qty 40

## 2021-06-25 MED ORDER — IOHEXOL 350 MG/ML SOLN
80.0000 mL | Freq: Once | INTRAVENOUS | Status: AC | PRN
  Administered 2021-06-25: 80 mL via INTRAVENOUS

## 2021-06-25 MED ORDER — FENTANYL BOLUS VIA INFUSION
25.0000 ug | INTRAVENOUS | Status: DC | PRN
  Administered 2021-06-25: 50 ug via INTRAVENOUS
  Administered 2021-06-25: 25 ug via INTRAVENOUS
  Administered 2021-06-25: 50 ug via INTRAVENOUS
  Administered 2021-06-25: 100 ug via INTRAVENOUS
  Administered 2021-06-25 (×3): 25 ug via INTRAVENOUS
  Filled 2021-06-25: qty 100

## 2021-06-25 MED ORDER — SODIUM CHLORIDE 0.9 % IV SOLN
1.0000 g | Freq: Two times a day (BID) | INTRAVENOUS | Status: DC
  Administered 2021-06-25 – 2021-06-26 (×3): 1 g via INTRAVENOUS
  Filled 2021-06-25 (×3): qty 1

## 2021-06-25 MED ORDER — ORAL CARE MOUTH RINSE
15.0000 mL | OROMUCOSAL | Status: DC
  Administered 2021-06-25 – 2021-06-26 (×11): 15 mL via OROMUCOSAL

## 2021-06-25 MED ORDER — FENTANYL 2500MCG IN NS 250ML (10MCG/ML) PREMIX INFUSION
25.0000 ug/h | INTRAVENOUS | Status: DC
  Administered 2021-06-25: 25 ug/h via INTRAVENOUS
  Filled 2021-06-25: qty 250

## 2021-06-25 MED ORDER — SODIUM CHLORIDE 0.9 % IV SOLN: 2.0000 g | Freq: Two times a day (BID) | INTRAVENOUS | Status: DC

## 2021-06-25 MED ORDER — NOREPINEPHRINE 4 MG/250ML-% IV SOLN
0.0000 ug/min | INTRAVENOUS | Status: DC
  Administered 2021-06-25 – 2021-06-26 (×2): 9 ug/min via INTRAVENOUS
  Filled 2021-06-25 (×2): qty 250

## 2021-06-25 MED ORDER — NOREPINEPHRINE 4 MG/250ML-% IV SOLN
INTRAVENOUS | Status: AC
  Administered 2021-06-25: 2 ug/min via INTRAVENOUS
  Filled 2021-06-25: qty 250

## 2021-06-25 MED ORDER — NOREPINEPHRINE 4 MG/250ML-% IV SOLN
0.0000 ug/min | INTRAVENOUS | Status: DC
  Administered 2021-06-25: 2 ug/min via INTRAVENOUS

## 2021-06-25 MED ORDER — PANTOPRAZOLE SODIUM 40 MG IV SOLR
40.0000 mg | Freq: Two times a day (BID) | INTRAVENOUS | Status: DC
  Administered 2021-06-25 – 2021-07-03 (×16): 40 mg via INTRAVENOUS
  Filled 2021-06-25 (×16): qty 40

## 2021-06-25 MED ORDER — FENTANYL CITRATE (PF) 100 MCG/2ML IJ SOLN
25.0000 ug | Freq: Once | INTRAMUSCULAR | Status: DC
Start: 2021-06-25 — End: 2021-06-26

## 2021-06-25 MED ORDER — LEVOFLOXACIN IN D5W 750 MG/150ML IV SOLN: 750.0000 mg | INTRAVENOUS | Status: DC

## 2021-06-25 NOTE — Progress Notes (Addendum)
NAME:  Mary Strickland, MRN:  [id number], DOB:  Jan 18, 1930, LOS: 0 ADMISSION DATE:  06/24/2021, CONSULTATION DATE:  06/24/21 REFERRING MD:  ED Doc, CHIEF COMPLAINT:  hemoptysis   History of Present Illness:  This 87 y.o. Caucasian female reformed smoker presented to the [facility] Emergency Department via EMS with complaints of acute onset hemoptysis.  She reported that she awoke from her sleep and experienced a bout of hemoptysis.  EMS noted coarse breath sounds.  She was hypertensive.  She denied any chest pain.  She was placed on supplemental oxygen and transported to [facility].  In the ER, she was initially observed to be hemodynamically stable but had a sudden bout of oxygen desaturation to the 80s and demonstrated altered mental status/confusion, prompting endotracheal intubation.  Has pneumomediastinum, pneumothorax Requiring pressors  Pertinent  Medical History   Past Medical History:  Diagnosis Date   Anxiety    Back pain    Blood loss anemia    Mild postoperative blood loss anemia   Dyslipidemia    GERD (gastroesophageal reflux disease)    History of diverticulitis of colon    Hypertension    Idiopathic scoliosis    Lumbosacral spondylosis    Mitral valve prolapse    Neuroma, Morton's    both feet,surgery done   Palpitations    hx. of   Psychosis ([facility])    Right carotid bruit    Ulcer    History of ulcers   Significant Hospital Events: Including procedures, antibiotic start and stop dates in addition to other pertinent events   CT scan with pneumomediastinum, pneumothorax, extensive soft tissue gas in the abdominal wall  Interim History / Subjective:  Critically ill Requiring pressors Fully sedated  Objective   Blood pressure (!) 94/40, pulse 72, temperature 99.86 F (37.7 C), resp. rate 18, height '5\' 2"'$  (1.575 m), weight 75.2 kg, SpO2 97 %.    Vent Mode: PRVC FiO2 (%):  [50 %-100 %] 50 % Set Rate:  [15 bmp] 15 bmp Vt Set:  [400 mL]  400 mL PEEP:  [5 cmH20] 5 cmH20 Plateau Pressure:  [30 cmH20-36 cmH20] 33 cmH20   Intake/Output Summary (Last 24 hours) at 06/25/2021 [id number] Last data filed at 06/25/2021 [id number] Gross per 24 hour  Intake 1728.98 ml  Output 950 ml  Net 778.98 ml   Filed Weights   06/24/21 2226 06/25/21 0500  Weight: 73.5 kg 75.2 kg    Examination: General: Elderly lady, ill-appearing, subcutaneous emphysema HENT: Endotracheal tube in place, moist oral mucosa, subcu emphysema around neck Lungs: Decreased air movement bilaterally, mild rhonchi Cardiovascular: S1-S2 appreciated Abdomen: Bowel sounds appreciated Extremities: No edema, no clubbing Neuro: Sedated GU: Fair output   CT scan of the chest reviewed by myself Chest x-ray this morning 0855 reviewed  Repeat ABG pending  Resolved Hospital Problem list     Assessment & Plan:  Acute hypoxemic respiratory failure Continue mechanical ventilation  Target Plateau Pressure < 30cm H20 Target driving pressure less than 15 cm of water Target PaO2 55-65: titrate PEEP/FiO2 per protocol Ventilator associated pneumonia prevention protocol  Hemoptysis -Source is unclear at the present time -Does not appear to be having ongoing hemoptysis -Unclear whether this is hemoptysis or pseudohemoptysis  Pneumomediastinum Pneumothorax -No evidence of tension pneumothorax -Chest x-ray appears to be stable -We will continue to monitor closely should she need a chest tube  Hypertension -Hold home antihypertensives at present  Possible pneumonia -Continue empiric antibiotics -Follow cultures  Titrate  sedation to RASS -2 Follow arterial blood gases Follow cultures  Following discussions with patient's daughter-patient is DNR Risk of worsening is very high   Best Practice (right click and "Reselect all SmartList Selections" daily)   Diet/type: NPO DVT prophylaxis: prophylactic heparin  GI prophylaxis: PPI Lines: N/A Foley:  N/A Code Status:   DNR Last date of multidisciplinary goals of care discussion [pending]  Labs   CBC: Recent Labs  Lab 06/24/21 2230 06/25/21 0727  WBC 22.2* 28.5*  NEUTROABS 17.8*  --   HGB 13.7 12.3  HCT 41.0 37.6  MCV 90.7 93.5  PLT 299 [id number]    Basic Metabolic Panel: Recent Labs  Lab 06/24/21 2230 06/25/21 0727  NA 137 136  K 3.7 4.4  CL 103 105  CO2 23 20*  GLUCOSE 163* 132*  BUN 23 23  CREATININE 0.97 1.05*  CALCIUM 9.0 8.2*  MG  --  1.8   GFR: Estimated Creatinine Clearance: 35.1 mL/min (A) (by C-G formula based on SCr of 1.05 mg/dL (H)). Recent Labs  Lab 06/24/21 2230 06/25/21 0727  WBC 22.2* 28.5*    Liver Function Tests: Recent Labs  Lab 06/24/21 2230  AST 27  ALT 21  ALKPHOS 41  BILITOT 0.5  PROT 7.9  ALBUMIN 4.5   No results for input(s): LIPASE, AMYLASE in the last 168 hours. No results for input(s): AMMONIA in the last 168 hours.  ABG    Component Value Date/Time   PHART 7.283 (L) 06/25/2021 0500   PCO2ART 45.5 06/25/2021 0500   PO2ART 74.6 (L) 06/25/2021 0500   HCO3 20.8 06/25/2021 0500   TCO2 26 03/31/2008 2113   ACIDBASEDEF 5.2 (H) 06/25/2021 0500   O2SAT 93.7 06/25/2021 0500     Coagulation Profile: Recent Labs  Lab 06/24/21 2303  INR 0.9    Cardiac Enzymes: No results for input(s): CKTOTAL, CKMB, CKMBINDEX, TROPONINI in the last 168 hours.  HbA1C: No results found for: HGBA1C  CBG: No results for input(s): GLUCAP in the last 168 hours.  Review of Systems:   unobtainable  Past Medical History:  She,  has a past medical history of Anxiety, Back pain, Blood loss anemia, Dyslipidemia, GERD (gastroesophageal reflux disease), History of diverticulitis of colon, Hypertension, Idiopathic scoliosis, Lumbosacral spondylosis, Mitral valve prolapse, Neuroma, Morton's, Palpitations, Psychosis ([place]), Right carotid bruit, and Ulcer.   Surgical History:   Past Surgical History:  Procedure Laterality Date   cataracts     both eyes   EXCISION  MORTON'S NEUROMA     both feet   LUMBAR FUSION     OTHER SURGICAL HISTORY     hysterectomy   OTHER SURGICAL HISTORY     left breast biopsy/lymph node   TOTAL KNEE ARTHROPLASTY  October 2002   right     Social History:   reports that she has quit smoking. She has never used smokeless tobacco. She reports that she does not drink alcohol and does not use drugs.   Family History:  Her family history includes Heart attack in her father; Heart disease in her father and mother. There is no history of Breast cancer.   Allergies Allergies  Allergen Reactions   Percocet [Oxycodone-Acetaminophen]    Sulfamethoxazole-Trimethoprim     REACTION: SOB, itching,   Zocor [Simvastatin]      Home Medications  Prior to Admission medications   Medication Sig Start Date End Date Taking? Authorizing Provider  amLODipine (NORVASC) 5 MG tablet Take 1 tablet (5 mg total) by mouth daily.  01/26/21 04/26/21  Deberah Pelton, NP  aspirin 81 MG tablet Take 81 mg by mouth daily.    [provider]  valsartan (DIOVAN) 40 MG tablet TAKE 1 TABLET BY MOUTH EVERY DAY 04/25/21   Martinique, Peter M, MD    The patient is critically ill with multiple organ systems failure and requires high complexity decision making for assessment and support, frequent evaluation and titration of therapies, application of advanced monitoring technologies and extensive interpretation of multiple databases. Critical Care Time devoted to patient care services described in this note independent of APP/resident time (if applicable)  is 35 minutes.   Sherrilyn Rist MD [facility] Personal pager: See Amion If unanswered, please page CCM On-call: [phone]

## 2021-06-25 NOTE — Procedures (Signed)
Central Venous Catheter Insertion Procedure Note  Mary Strickland  [id number]  [birth year]  Date:06/25/21  Time:3:58 PM   Provider Performing:Manson Luckadoo A Caliope Ruppert   Procedure: Insertion of Non-tunneled Central Venous Catheter(36556) with US guidance [id number])   Indication(s) Medication administration  Consent Unable to obtain consent due to emergent nature of procedure.  Anesthesia Topical only with 1% lidocaine   Timeout Verified patient identification, verified procedure, site/side was marked, verified correct patient position, special equipment/implants available, medications/allergies/relevant history reviewed, required imaging and test results available.  Sterile Technique Maximal sterile technique including full sterile barrier drape, hand hygiene, sterile gown, sterile gloves, mask, hair covering, sterile ultrasound probe cover (if used).  Procedure Description Area of catheter insertion was cleaned with chlorhexidine and draped in sterile fashion.  With real-time ultrasound guidance a central venous catheter was placed into the left internal jugular vein. Nonpulsatile blood flow and easy flushing noted in all ports.  The catheter was sutured in place and sterile dressing applied.  Complications/Tolerance None; patient tolerated the procedure well. Chest X-ray is ordered to verify placement for internal jugular or subclavian cannulation.   Chest x-ray is not ordered for femoral cannulation.  EBL Minimal  Specimen(s) None

## 2021-06-25 NOTE — Progress Notes (Signed)
Patient with moderate pneumothorax  Blood pressure trending down  Called daughter and granddaughters phone numbers Left message for daughter cheryl Hassell Done to call back  Spoke with granddaughter and updated her about the need for a chest tube  Will go ahead with chest tube placement

## 2021-06-25 NOTE — Progress Notes (Signed)
Pharmacy Antibiotic Note  Mary Strickland is a 87 y.o. female admitted on 06/24/2021 with  pneumoperitoneum  - source unknown.  Pharmacy has been consulted for Cefepime dosing.   Intensivist ordered Levaquin + Cefepime. After discussion, will switch to Meropenem for intra-abdominal coverage and PNA/hemoptysis. Scr at patient's baseline- est CrCl ~ 62m/min.    Plan: Meropenem 1gm IV q12h Monitor renal function and cx data   Height: '5\' 2"'$  (157.5 cm) Weight: 73.5 kg (162 lb) IBW/kg (Calculated) : 50.1  Temp (24hrs), Avg:98 F (36.7 C), Min:96.8 F (36 C), Max:98.7 F (37.1 C)  Recent Labs  Lab 06/24/21 2230  WBC 22.2*  CREATININE 0.97    Estimated Creatinine Clearance: 37.7 mL/min (by C-G formula based on SCr of 0.97 mg/dL).    Allergies  Allergen Reactions   Percocet [Oxycodone-Acetaminophen]    Sulfamethoxazole-Trimethoprim     REACTION: SOB, itching,   Zocor [Simvastatin]     Antimicrobials this admission: 7/24 Meropenem >>   Dose adjustments this admission  Microbiology results:   Thank you for allowing pharmacy to be a part of this patient's care.  MNetta CedarsPharmD 06/25/2021 3:38 AM

## 2021-06-25 NOTE — ED Notes (Signed)
ED TO INPATIENT HANDOFF REPORT  ED Nurse Name and Phone #: Erick Colace, RN [phone]  S Name/Age/Gender Mary Strickland 87 y.o. female Room/Bed: RESB/RESB  Code Status   Code Status: Full Code  Home/SNF/Other Home Patient oriented to: self, place, time and situation Is this baseline? Yes   Triage Complete: Triage complete  Chief Complaint Acute hypoxemic respiratory failure ([place]) [J96.01]  Triage Note An hour ago patient woke up and  felt like she needed to cough. Patient cough up blood. Patient has no hx of respiratory problems. Lungs sound rhonchi throughout. Patient on Non rebreather 91% on 15 L.bbp 210/100 HR-100.    Allergies Allergies  Allergen Reactions  . Percocet [Oxycodone-Acetaminophen]   . Sulfamethoxazole-Trimethoprim     REACTION: SOB, itching,  . Zocor [Simvastatin]     Level of Care/Admitting Diagnosis ED Disposition    ED Disposition  Admit   Condition  --   Comment  Hospital Area: [place] [[id number]]  Level of Care: ICU [6]  May admit patient to [facility] or [facility] if equivalent level of care is available:: Yes  Covid Evaluation: Confirmed COVID Negative  Diagnosis: Acute hypoxemic respiratory failure [facility]) [id number]  Admitting Physician: Renee Pain [id number]  Attending Physician: Renee Pain [id number]  Estimated length of stay: 5 - 7 days  Certification:: I certify this patient will need inpatient services for at least 2 midnights         B Medical/Surgery History Past Medical History:  Diagnosis Date  . Anxiety   . Back pain   . Blood loss anemia    Mild postoperative blood loss anemia  . Dyslipidemia   . GERD (gastroesophageal reflux disease)   . History of diverticulitis of colon   . Hypertension   . Idiopathic scoliosis   . Lumbosacral spondylosis   . Mitral valve prolapse   . Neuroma, Morton's    both feet,surgery done  . Palpitations    hx. of  . Psychosis ([place])   . Right  carotid bruit   . Ulcer    History of ulcers   Past Surgical History:  Procedure Laterality Date  . cataracts     both eyes  . EXCISION MORTON'S NEUROMA     both feet  . LUMBAR FUSION    . OTHER SURGICAL HISTORY     hysterectomy  . OTHER SURGICAL HISTORY     left breast biopsy/lymph node  . TOTAL KNEE ARTHROPLASTY  October 2002   right     A IV Location/Drains/Wounds Patient Lines/Drains/Airways Status    Active Line/Drains/Airways    Name Placement date Placement time Site Days   Peripheral IV 06/24/21 18 G Left Antecubital 06/24/21  --  Antecubital  1   Peripheral IV 06/24/21 20 G Right Antecubital 06/24/21  2320  Antecubital  1   NG/OG Vented/Dual Lumen Orogastric Oral External length of tube 36 cm 06/25/21  0029  Oral  less than 1   Urethral Catheter Cammy Copa, EMT Temperature probe 16 Fr. 06/25/21  0008  Temperature probe  less than 1   Airway 7.5 mm 06/24/21  2340  -- 1          Intake/Output Last 24 hours  Intake/Output Summary (Last 24 hours) at 06/25/2021 0320 Last data filed at 06/25/2021 0256 Gross per 24 hour  Intake 499.5 ml  Output 700 ml  Net -200.5 ml    Labs/Imaging Results for orders placed or performed during the hospital encounter of 06/24/21 (from  the past 48 hour(s))  CBC with Differential     Status: Abnormal   Collection Time: 06/24/21 10:30 PM  Result Value Ref Range   WBC 22.2 (H) 4.0 - 10.5 K/uL   RBC 4.52 3.87 - 5.11 MIL/uL   Hemoglobin 13.7 12.0 - 15.0 g/dL   HCT 41.0 36.0 - 46.0 %   MCV 90.7 80.0 - 100.0 fL   MCH 30.3 26.0 - 34.0 pg   MCHC 33.4 30.0 - 36.0 g/dL   RDW 13.8 11.5 - 15.5 %   Platelets 299 150 - 400 K/uL   nRBC 0.0 0.0 - 0.2 %   Neutrophils Relative % 81 %   Neutro Abs 17.8 (H) 1.7 - 7.7 K/uL   Lymphocytes Relative 13 %   Lymphs Abs 2.9 0.7 - 4.0 K/uL   Monocytes Relative 5 %   Monocytes Absolute 1.1 (H) 0.1 - 1.0 K/uL   Eosinophils Relative 0 %   Eosinophils Absolute 0.1 0.0 - 0.5 K/uL   Basophils  Relative 0 %   Basophils Absolute 0.1 0.0 - 0.1 K/uL   Immature Granulocytes 1 %   Abs Immature Granulocytes 0.26 (H) 0.00 - 0.07 K/uL    Comment: Performed at [facility], [place] [address] [place], [place] [address]  Comprehensive metabolic panel     Status: Abnormal   Collection Time: 06/24/21 10:30 PM  Result Value Ref Range   Sodium 137 135 - 145 mmol/L   Potassium 3.7 3.5 - 5.1 mmol/L   Chloride 103 98 - 111 mmol/L   CO2 23 22 - 32 mmol/L   Glucose, Bld 163 (H) 70 - 99 mg/dL    Comment: Glucose reference range applies only to samples taken after fasting for at least 8 hours.   BUN 23 8 - 23 mg/dL   Creatinine, Ser 0.97 0.44 - 1.00 mg/dL   Calcium 9.0 8.9 - 10.3 mg/dL   Total Protein 7.9 6.5 - 8.1 g/dL   Albumin 4.5 3.5 - 5.0 g/dL   AST 27 15 - 41 U/L   ALT 21 0 - 44 U/L   Alkaline Phosphatase 41 38 - 126 U/L   Total Bilirubin 0.5 0.3 - 1.2 mg/dL   GFR, Estimated 56 (L) >60 mL/min    Comment: (NOTE) Calculated using the CKD-EPI Creatinine Equation (2021)    Anion gap 11 5 - 15    Comment: Performed at [facility], [place] [address], [place], [place] [address]  Brain natriuretic peptide     Status: Abnormal   Collection Time: 06/24/21 10:30 PM  Result Value Ref Range   B Natriuretic Peptide 100.7 (H) 0.0 - 100.0 pg/mL    Comment: Performed at [facility], [place] [address], [place], [place] [address]  Troponin I (High Sensitivity)     Status: None   Collection Time: 06/24/21 10:30 PM  Result Value Ref Range   Troponin I (High Sensitivity) 17 <18 ng/L    Comment: (NOTE) Elevated high sensitivity troponin I (hsTnI) values and significant  changes across serial measurements may suggest ACS but many other  chronic and acute conditions are known to elevate hsTnI results.  Refer to the "Links" section for chest pain algorithms and additional  guidance. Performed at [facility], [place] [address] [place], [place] [address]   Type and screen [place]     Status: None   Collection Time: 06/24/21 10:31 PM  Result Value Ref Range  ABO/RH(D) A POS    Antibody Screen NEG    Sample Expiration      06/27/2021,2359 Performed at [facility], [place] [address], [place], [place] [address]   Protime-INR     Status: None   Collection Time: 06/24/21 11:03 PM  Result Value Ref Range   Prothrombin Time 12.6 11.4 - 15.2 seconds   INR 0.9 0.8 - 1.2    Comment: (NOTE) INR goal varies based on device and disease states. Performed at [facility], [place] [address], [place], [place] [address]   Resp Panel by RT-PCR (Flu A&B, Covid) Nasopharyngeal Swab     Status: None   Collection Time: 06/24/21 11:04 PM   Specimen: Nasopharyngeal Swab; Nasopharyngeal(NP) swabs in vial transport medium  Result Value Ref Range   SARS Coronavirus 2 by RT PCR NEGATIVE NEGATIVE    Comment: (NOTE) SARS-CoV-2 target nucleic acids are NOT DETECTED.  The SARS-CoV-2 RNA is generally detectable in upper respiratory specimens during the acute phase of infection. The lowest concentration of SARS-CoV-2 viral copies this assay can detect is 138 copies/mL. A negative result does not preclude SARS-Cov-2 infection and should not be used as the sole basis for treatment or other patient management decisions. A negative result may occur with  improper specimen collection/handling, submission of specimen other than nasopharyngeal swab, presence of viral mutation(s) within the areas targeted by this assay, and inadequate number of viral copies(<138 copies/mL). A negative result must be combined with clinical observations, patient history, and epidemiological information. The expected result is Negative.  Fact Sheet for Patients:  EntrepreneurPulse.com.au  Fact Sheet for Healthcare Providers:  IncredibleEmployment.be  This test is  no t yet approved or cleared by the [place] FDA and  has been authorized for detection and/or diagnosis of SARS-CoV-2 by FDA under an Emergency Use Authorization (EUA). This EUA will remain  in effect (meaning this test can be used) for the duration of the COVID-19 declaration under Section 564(b)(1) of the Act, 21 U.S.C.section 360bbb-3(b)(1), unless the authorization is terminated  or revoked sooner.       Influenza A by PCR NEGATIVE NEGATIVE   Influenza B by PCR NEGATIVE NEGATIVE    Comment: (NOTE) The Xpert Xpress SARS-CoV-2/FLU/RSV plus assay is intended as an aid in the diagnosis of influenza from Nasopharyngeal swab specimens and should not be used as a sole basis for treatment. Nasal washings and aspirates are unacceptable for Xpert Xpress SARS-CoV-2/FLU/RSV testing.  Fact Sheet for Patients: EntrepreneurPulse.com.au  Fact Sheet for Healthcare Providers: IncredibleEmployment.be  This test is not yet approved or cleared by the [place] FDA and has been authorized for detection and/or diagnosis of SARS-CoV-2 by FDA under an Emergency Use Authorization (EUA). This EUA will remain in effect (meaning this test can be used) for the duration of the COVID-19 declaration under Section 564(b)(1) of the Act, 21 U.S.C. section 360bbb-3(b)(1), unless the authorization is terminated or revoked.  Performed at [facility], [place] [address] [place], [place] [address]   Blood gas, arterial (at [facility])     Status: Abnormal   Collection Time: 06/24/21 11:58 PM  Result Value Ref Range   FIO2 100.00    Mode PRESSURE REGULATED VOLUME CONTROL    pH, Arterial 7.060 (LL) 7.350 - 7.450    Comment: CRITICAL RESULT CALLED TO, READ BACK BY AND VERIFIED WITH: Zenon Mayo AT 0017 ON 06/25/21 BY MAJ    pCO2 arterial 80.2 (HH) 32.0 - 48.0 mmHg  Comment: CRITICAL RESULT CALLED TO, READ BACK BY AND VERIFIED WITH: Zenon Mayo  AT 0017 ON 06/25/21 BY MAJ    pO2, Arterial 221 (H) 83.0 - 108.0 mmHg   Bicarbonate 21.9 20.0 - 28.0 mmol/L   Acid-base deficit 10.3 (H) 0.0 - 2.0 mmol/L   O2 Saturation 98.2 %   Patient temperature 97.7    Allens test (pass/fail) PASS PASS    Comment: Performed at [facility], [place] [address] [place], [place] [address]  Triglycerides     Status: None   Collection Time: 06/25/21  1:15 AM  Result Value Ref Range   Triglycerides 91 <150 mg/dL    Comment: Performed at [facility], [place] [address] [place], [place] [address]   DG Chest 2 View  Result Date: 06/24/2021 CLINICAL DATA:  87 year old female with shortness of breath. EXAM: CHEST - 2 VIEW COMPARISON:  Chest radiograph dated 03/31/2008. FINDINGS: Bibasilar linear atelectasis/scarring. No focal consolidation, pleural effusion, or pneumothorax. The cardiac silhouette is within limits. Atherosclerotic calcification of the aorta. Lower thoracic spinal stimulator. No acute osseous pathology. Degenerative changes of the spine. IMPRESSION: No active cardiopulmonary disease. Electronically Signed   By: Anner Crete M.D.   On: 06/24/2021 22:58   CT Angio Chest PE W and/or Wo Contrast  Result Date: 06/25/2021 CLINICAL DATA:  Patient woke feeling as though she needed to cough, hematemesis, no history of respiratory problems EXAM: CT ANGIOGRAPHY CHEST WITH CONTRAST TECHNIQUE: Multidetector CT imaging of the chest was performed using the standard protocol during bolus administration of intravenous contrast. Multiplanar CT image reconstructions and MIPs were obtained to evaluate the vascular anatomy. CONTRAST:  71m OMNIPAQUE IOHEXOL 350 MG/ML SOLN COMPARISON:  Chest radiograph 06/24/2021, 06/25/2021, CT T-spine 11/20/2007 FINDINGS: Cardiovascular: Satisfactory opacification the pulmonary arteries to the segmental level. No pulmonary artery filling defects are identified. Central pulmonary arteries are normal  caliber. Cardiac size within normal limits. No pericardial fluid. Question pneumopericardium given the gas surrounding the cardiac apex of this may be part of the more extensive pneumomediastinum. Three-vessel coronary artery atherosclerosis is noted. Suboptimal opacification of the thoracic aorta for luminal assessment. Broad-based outpouching along the proximal descending thoracic aorta (6/40) just distal to the left subclavian artery origin, could reflect sequela of prior plaque ulceration or intramural hematoma without acute stranding or inflammatory change at this time. Additional extensive atheromatous plaque seen within the proximal great vessels, incompletely assessed on this non tailored examination of the aorta. There is venous reflux into the hepatic veins and IVC which can indicate elevated right heart pressures. Mediastinum/Nodes: Extensive pneumomediastinum without convincing free mediastinal fluid. Patient is intubated at this time with the endotracheal tube terminating low within the trachea 1.4 cm from the carina. Secretions noted within the endotracheal tube and layering in the distal trachea and central airways. Transesophageal tube in place as well, coiled in the gastric lumen. Lungs/Pleura: Moderate left pneumothorax. Suspect much of the air along surrounding the anterior right lung is to be within the subpleural space related to the pneumomediastinum. There are patchy areas of dependent consolidative and ground-glass opacity as well as more atelectatic regions though demonstrating some heterogeneous enhancement of the airless lungs which could suggest underlying consolidation/infection. No visible layering effusions. Upper Abdomen: Extensive soft tissue gas is seen extending along the anterior abdominal wall, much of which may be contained within the fascial planes of the both superficial and deep to the rectus sheath including foci of gas tracking along the umbilical vein. Small amount of  free  intraperitoneal air is difficult to fully exclude. Extensive atheromatous plaque seen within the distal thoracic aorta including more lamellated appearance towards the diaphragmatic hiatus. Transesophageal tube tip terminating in the stomach. Musculoskeletal: No acute osseous abnormality or suspicious osseous lesion. Extensive soft cutaneous gas extending across the chest wall throughout the soft tissues of the neck as well as extending along the anterior rectus sheath and fascial planes of the anterior abdominal wall. Review of the MIP images confirms the above findings. IMPRESSION: No evidence of pulmonary artery embolism to the segmental level. Extensive pneumomediastinum throughout the chest extending into the base of the neck and into the anterior fascial planes of the abdominal wall. Small amount of intraperitoneal free air is difficult to fully exclude. Question a degree of pneumopericardium as well given that gas extends contiguously across the cardiac apex as well. A moderate left pneumothorax is present. Some gas is seen surrounding the anterior portion of the right lung as well though question whether not this is subpleural versus intrapleural in nature given the extent of mediastinal gas. Patient is intubated at the time of exam low positioning of the endotracheal tube 1.4 cm from the carina. Could consider retraction to the mid trachea for optimal positioning. Scattered secretions are seen within the endotracheal tube and in the central airways, dependent regions of more consolidative and ground-glass opacity are present in the lungs, some which is likely atelectatic though heterogeneous enhancement of the airless lung is suspicious for underlying infectious consolidation as well. Reflux of contrast in the hepatic veins and IVC, nonspecific though can be seen with elevated right heart pressures. Extensive atherosclerotic calcification of the thoracic aorta and proximal great vessels. Lamellated  calcification of the distal thoracic aorta towards the diaphragmatic hiatus as well as a broad-based outpouching of the proximal descending thoracic aorta, could reflect sequela of prior intramural hematoma or dissection versus irregular plaque ulcerations, incompletely assessed on this exam but without supportive features to suggest acuity at this time. Could be further interrogated with dedicated CT angiography of the aorta when patient is stabilized and better able to tolerate the procedure. Aortic Atherosclerosis (ICD10-I70.0). These results were called by telephone at the time of interpretation on 06/25/2021 at 2:45 am to provider Dr Sedonia Small , who verbally acknowledged these results. Electronically Signed   By: Lovena Le M.D.   On: 06/25/2021 02:46   DG Chest Portable 1 View  Result Date: 06/25/2021 CLINICAL DATA:  Shortness of breath and hemoptysis EXAM: PORTABLE CHEST 1 VIEW COMPARISON:  06/24/2021 FINDINGS: Support Apparatus: --Endotracheal tube: Tip 2.5 cm above the inferior margin of the carina. Retraction by 3 cm would place it at the level of the clavicular heads. --Enteric tube:Tip and sideport project over the stomach. --Vascular catheter(s):None --Other: None Right parahilar opacity and left lateral basilar dense opacity, both worsened since the earlier radiograph. Right basilar atelectasis. Subcutaneous emphysema in the neck. IMPRESSION: 1. Endotracheal tube tip 2.5 cm above the inferior margin of the carina. Retraction by 3 cm would place the tip at the level of the clavicular heads. 2. Worsening left lateral basilar and right parahilar opacities. 3. Subcutaneous emphysema in the neck. CTA of the chest is pending at the time of dictation. Electronically Signed   By: Ulyses Jarred M.D.   On: 06/25/2021 00:51   DG Abd Portable 1V  Result Date: 06/25/2021 CLINICAL DATA:  OG tube placement EXAM: PORTABLE ABDOMEN - 1 VIEW COMPARISON:  None. FINDINGS: Orogastric tube tip and side port project in  the stomach. IMPRESSION: Orogastric tube tip and side port in the stomach. Electronically Signed   By: Ulyses Jarred M.D.   On: 06/25/2021 00:51    Pending Labs Unresulted Labs (From admission, onward)    Start     Ordered   06/26/21 0500  QuantiFERON-TB Gold Plus  Tomorrow morning,   R       Question:  Release to patient  Answer:  Immediate   06/25/21 0042   06/26/21 0500  Triglycerides  (propofol (DIPRIVAN))  Every 72 hours,   R     Comments: While on propofol (DIPRIVAN)    06/25/21 0313   06/25/21 0500  Triglycerides  (propofol (DIPRIVAN))  Every 72 hours,   R     Comments: While on propofol (DIPRIVAN)    06/24/21 2334   06/25/21 0500  CBC  Tomorrow morning,   R        06/25/21 0313   06/25/21 [id number]  Basic metabolic panel  Tomorrow morning,   R        06/25/21 0313   06/25/21 0500  Blood gas, arterial  Tomorrow morning,   R        06/25/21 0313   06/25/21 0500  Magnesium  Tomorrow morning,   R        06/25/21 0313   06/25/21 0312  Culture, Respiratory w Gram Stain (tracheal aspirate)  Once,   STAT       Question:  Patient immune status  Answer:  Normal   06/25/21 0313   06/25/21 0312  Urinalysis, Routine w reflex microscopic  Once,   STAT        06/25/21 0313   06/25/21 0311  Culture, blood (routine x 2)  BLOOD CULTURE X 2,   R (with STAT occurrences)     Question:  Patient immune status  Answer:  Normal   06/25/21 0313   06/25/21 0002  ANA w/Reflex if Positive  Once,   STAT        06/25/21 0001          Vitals/Pain Today's Vitals   06/25/21 0230 06/25/21 0245 06/25/21 0300 06/25/21 0315  BP: (!) 169/86 (!) 174/112 (!) 156/72 134/79  Pulse: 99 (!) 103 (!) 104 (!) 104  Resp: 17 (!) '22 16 19  '$ Temp: (!) 96.8 F (36 C) (!) 96.9 F (36.1 C) (!) 97.3 F (36.3 C) 97.7 F (36.5 C)  TempSrc:      SpO2: 94% 95% 94% 95%  Weight:      Height:      PainSc:        Isolation Precautions Airborne and Contact precautions  Medications Medications  sodium chloride 0.9 %  bolus 500 mL (0 mLs Intravenous Stopped 06/24/21 2334)    Followed by  0.9 %  sodium chloride infusion (1,000 mLs Intravenous New Bag/Given 06/24/21 2343)  fentaNYL 2566mg in NS 2560m(1058mml) infusion-PREMIX (25 mcg/hr Intravenous Restarted 06/25/21 0222)  fentaNYL (SUBLIMAZE) bolus via infusion 25 mcg (25 mcg Intravenous Bolus from Bag 06/25/21 0138)  propofol (DIPRIVAN) 1000 MG/100ML infusion (20 mcg/kg/min  73.5 kg Intravenous Rate/Dose Change 06/25/21 0242)  docusate sodium (COLACE) capsule 100 mg (has no administration in time range)  polyethylene glycol (MIRALAX / GLYCOLAX) packet 17 g (has no administration in time range)  docusate (COLACE) 50 MG/5ML liquid 100 mg (has no administration in time range)  polyethylene glycol (MIRALAX / GLYCOLAX) packet 17 g (has no administration in time range)  ondansetron (ZOFRAN) injection 4 mg (  has no administration in time range)  ipratropium-albuterol (DUONEB) 0.5-2.5 (3) MG/3ML nebulizer solution 3 mL (has no administration in time range)  fentaNYL (SUBLIMAZE) injection 25 mcg (has no administration in time range)  fentaNYL 2562mg in NS 2545m(1067mml) infusion-PREMIX (has no administration in time range)  fentaNYL (SUBLIMAZE) bolus via infusion 25-100 mcg (has no administration in time range)  propofol (DIPRIVAN) 1000 MG/100ML infusion (has no administration in time range)  levofloxacin (LEVAQUIN) IVPB 750 mg (has no administration in time range)  methylPREDNISolone sodium succinate (SOLU-MEDROL) 125 mg/2 mL injection 125 mg (has no administration in time range)  fentaNYL (SUBLIMAZE) injection 50 mcg (50 mcg Intravenous Given 06/24/21 2345)  etomidate (AMIDATE) injection ( Intravenous Canceled Entry 06/24/21 2330)  succinylcholine (ANECTINE) injection ( Intravenous Canceled Entry 06/24/21 2330)  iohexol (OMNIPAQUE) 350 MG/ML injection 80 mL (80 mLs Intravenous Contrast Given 06/25/21 0200)  midazolam (VERSED) injection 2 mg (2 mg Intravenous Given  06/25/21 0141)    Mobility walks with device Low fall risk   Focused Assessments    R Recommendations: See Admitting Provider Note  Report given to:   Additional Notes:

## 2021-06-25 NOTE — Progress Notes (Signed)
Patient with moderate pneumothorax on CT  Ventilator parameters remain stable with peak airway pressure of 13  Pneumothorax has been stable on chest x-ray findings CT abdomen still revealed moderate pneumothorax  Was called about patient's blood pressure trending down Concern for worsening pneumothorax/pneumomediastinum  An attempt at smallbore chest tube placement was made in the anterior axillary line fifth interspace  Was not able to aspirate air freely with a finder needle as I was trying to numb skin and deep tissue, long needle was used to attempt to aspirate air without success  I did do an ultrasound scan and it appears that she may have a small loculated pneumothorax anteriorly  With stable respiratory parameters I did elect not to place a chest tube at present.  Follow-up with chest x-ray

## 2021-06-25 NOTE — Progress Notes (Signed)
PHARMACY NOTE:  ANTIMICROBIAL RENAL DOSAGE ADJUSTMENT  Current antimicrobial regimen includes a mismatch between antimicrobial dosage and estimated renal function.  As per policy approved by the Pharmacy & Therapeutics and Medical Executive Committees, the antimicrobial dosage will be adjusted accordingly.  Current antimicrobial dosage:  Levaquin '750mg'$  IV q24h  Indication: possible PNA  Renal Function:  Estimated Creatinine Clearance: 37.7 mL/min (by C-G formula based on SCr of 0.97 mg/dL). '[]'$      On intermittent HD, scheduled: '[]'$      On CRRT    Antimicrobial dosage has been changed to:  Levaquin '750mg'$  IV q48h  Additional comments:   Thank you for allowing pharmacy to be a part of this patient's care.  Netta Cedars, [facility] 06/25/2021 3:26 AM

## 2021-06-25 NOTE — Progress Notes (Signed)
Pt transported to CT on the vent with RN and back to her room without any complications.

## 2021-06-25 NOTE — Procedures (Signed)
Insertion of Chest Tube Procedure Note  Mary Strickland  [id number]  18-Oct-1930  Date:06/25/21  Time:5:45 PM    Provider Performing: Cicero Duck A Starlette Thurow   Procedure: Chest Tube Insertion ([id number])  Indication(s) Pneumothorax  Consent Risks of the procedure as well as the alternatives and risks of each were explained to the patient and/or caregiver.  Consent for the procedure was obtained and is signed in the bedside chart  Anesthesia Topical only with 1% lidocaine    Time Out Verified patient identification, verified procedure, site/side was marked, verified correct patient position, special equipment/implants available, medications/allergies/relevant history reviewed, required imaging and test results available.   Sterile Technique Maximal sterile technique including full sterile barrier drape, hand hygiene, sterile gown, sterile gloves, mask, hair covering, sterile ultrasound probe cover (if used).   Procedure Description Ultrasound not used to identify appropriate pleural anatomy for placement and overlying skin marked. Area of placement cleaned and draped in sterile fashion.  A 14 French pigtail pleural catheter was placed into the left pleural space using Seldinger technique. Appropriate return of air was obtained.  The tube was connected to atrium and placed on -20 cm H2O wall suction.   Complications/Tolerance None; patient tolerated the procedure well. Chest X-ray is ordered to verify placement.   EBL Minimal  Specimen(s) none

## 2021-06-25 NOTE — ED Notes (Signed)
Pt transported to CT. Vital signs unable to be assessed at this time.

## 2021-06-25 NOTE — Plan of Care (Signed)
  Problem: Safety: Goal: Non-violent Restraint(s) 06/25/2021 0640 by Selinda Michaels, RN Outcome: Progressing 06/25/2021 0640 by Selinda Michaels, RN Outcome: Progressing 06/25/2021 0639 by Selinda Michaels, RN Outcome: Progressing   Problem: Activity: Goal: Ability to tolerate increased activity will improve 06/25/2021 0640 by Selinda Michaels, RN Outcome: Progressing 06/25/2021 0640 by Selinda Michaels, RN Outcome: Progressing 06/25/2021 0639 by Selinda Michaels, RN Outcome: Progressing   Problem: Respiratory: Goal: Ability to maintain a clear airway and adequate ventilation will improve 06/25/2021 0640 by Selinda Michaels, RN Outcome: Progressing 06/25/2021 0640 by Selinda Michaels, RN Outcome: Progressing 06/25/2021 0639 by Selinda Michaels, RN Outcome: Progressing   Problem: Role Relationship: Goal: Method of communication will improve 06/25/2021 0640 by Selinda Michaels, RN Outcome: Progressing 06/25/2021 0640 by Selinda Michaels, RN Outcome: Progressing 06/25/2021 0639 by Selinda Michaels, RN Outcome: Progressing   Problem: Education: Goal: Knowledge of General Education information will improve Description: Including pain rating scale, medication(s)/side effects and non-pharmacologic comfort measures Outcome: Progressing   Problem: Health Behavior/Discharge Planning: Goal: Ability to manage health-related needs will improve Outcome: Progressing   Problem: Clinical Measurements: Goal: Ability to maintain clinical measurements within normal limits will improve Outcome: Progressing Goal: Will remain free from infection Outcome: Progressing Goal: Diagnostic test results will improve Outcome: Progressing Goal: Respiratory complications will improve Outcome: Progressing Goal: Cardiovascular complication will be avoided Outcome: Progressing   Problem: Activity: Goal: Risk for activity intolerance will decrease Outcome: Progressing   Problem: Nutrition: Goal:  Adequate nutrition will be maintained Outcome: Progressing   Problem: Coping: Goal: Level of anxiety will decrease Outcome: Progressing   Problem: Elimination: Goal: Will not experience complications related to bowel motility Outcome: Progressing Goal: Will not experience complications related to urinary retention Outcome: Progressing   Problem: Pain Managment: Goal: General experience of comfort will improve Outcome: Progressing   Problem: Safety: Goal: Ability to remain free from injury will improve Outcome: Progressing   Problem: Skin Integrity: Goal: Risk for impaired skin integrity will decrease Outcome: Progressing

## 2021-06-25 NOTE — Progress Notes (Signed)
Per E-link, switched main contact for patient to daughter Jacqulyn Cane [phone].

## 2021-06-25 NOTE — Progress Notes (Signed)
[facility] Progress Note Patient Name: Mary Strickland DOB: 05-19-1930 MRN: [id number]   Date of Service  06/25/2021  HPI/Events of Note  Called to room via camera to assess patient who has SBP in 50s. Improved to SBP 130s off sedation (had been on propofol @ 20).  Not tachycardic throughout this. Peak pressures on ventilator not elevated. SpO2 94% on FiO2 0.6 on vent.   eICU Interventions  Vitals and airway peak pressures not consistent with tension PTX, though hypotension clearly concerning. Higher suspicion for sedation-related hypotension.  Will get CXR to r/o tension PTX given known pneumomediastinum.  Ordered levophed to support BP. Once levophed started, will cautiously resume sedation to improve ventilator synchrony (highly dyssynchronous off sedation).     Intervention Category Intermediate Interventions: Hypotension - evaluation and management  Charlott Rakes 06/25/2021, 4:37 AM

## 2021-06-25 NOTE — ED Provider Notes (Addendum)
  Provider Note MRN:  [id number]  Arrival date & time: 06/25/21    ED Course and Medical Decision Making  Assumed care from Dr. Tomi Bamberger at shift change.  Patient is intubated for hemoptysis.  Called to bedside for hypotension.  Was given fentanyl plus midazolam bolus prior to CT imaging.  Was also on propofol drip.  Pressure 60s over 40s however patient feels warm and well-perfused, palpable radial pulse, not tachycardic.  Suspect related to sedation meds.  All medications paused, pressure bag clear fluid with improvement.  CT revealing complex findings, question pneumothorax, large amount of subcutaneous air.  I had multiple conversations with the intensivist, patient is improving clinically, she is not behaving like somebody with a pneumothorax under positive pressure.  She is becoming more hypertensive, she is hemodynamically stable, sitting comfortably.  Question of pseudo pneumothorax.  Plan is for patient to be admitted to the ICU, however if she were to decompensate, would need left-sided chest tube.  5 AM update: Called for critical intervention needed in ICU room 1237.  Reportedly patient decompensated and needed emergent chest tube.  On my arrival, patient seems to be doing better, not tachycardic, blood pressure reportedly in the [id number] systolic but now back up to 120s.  Discussed case with telemetry ICU doctor, suspect again is hypotension related to sedation medications and not tension pneumothorax, will hold off on chest tube.  .Critical Care  Date/Time: 06/25/2021 3:09 AM Performed by: Maudie Flakes, MD Authorized by: Maudie Flakes, MD   Critical care provider statement:    Critical care time (minutes):  45   Critical care was necessary to treat or prevent imminent or life-threatening deterioration of the following conditions:  Respiratory failure   Critical care was time spent personally by me on the following activities:  Discussions with consultants, evaluation of patient's  response to treatment, examination of patient, ordering and performing treatments and interventions, ordering and review of laboratory studies, ordering and review of radiographic studies, pulse oximetry, re-evaluation of patient's condition, obtaining history from patient or surrogate and review of old charts   I assumed direction of critical care for this patient from another provider in my specialty: yes     Care discussed with: admitting provider    Final Clinical Impressions(s) / ED Diagnoses     ICD-10-CM   1. Acute respiratory distress  R06.03     2. Hemoptysis  R04.2     3. Encounter for imaging study to confirm orogastric (OG) tube placement  Z01.89 DG Abd Portable 1V    DG Abd Portable 1V      ED Discharge Orders     None       Discharge Instructions   None     Barth Kirks. Sedonia Small, [place] [email or web address]    Maudie Flakes, MD 06/25/21 0310    Maudie Flakes, MD 06/26/21 0030

## 2021-06-25 NOTE — Progress Notes (Signed)
SBAR read. Ready to receive.

## 2021-06-25 NOTE — Progress Notes (Signed)
Pt transferred from ED to [id number] on the vent with RN without any complications.

## 2021-06-25 NOTE — H&P (Addendum)
NAME:  Mary Strickland MRN:  [id number] DOB:  07/11/1930 LOS: 0 ADMISSION DATE:  06/24/2021 DATE OF SERVICE:  06/25/2021  CHIEF COMPLAINT:  hemoptysis   HISTORY & PHYSICAL  History of Present Illness  This 87 y.o. Caucasian female reformed smoker presented to the [facility] Emergency Department via EMS with complaints of acute onset hemoptysis.  She reported that she awoke from her sleep and experienced a bout of hemoptysis.  EMS noted coarse breath sounds.  She was hypertensive.  She denied any chest pain.  She was placed on supplemental oxygen and transported to [facility].  In the ER, she was initially observed to be hemodynamically stable but had a sudden bout of oxygen desaturation to the 80s and demonstrated altered mental status/confusion, prompting endotracheal intubation.  REVIEW OF SYSTEMS This patient is critically ill and cannot provide additional history nor review of systems due to endotracheally intubated.   Past Medical/Surgical/Social/Family History   Past Medical History:  Diagnosis Date   Anxiety    Back pain    Blood loss anemia    Mild postoperative blood loss anemia   Dyslipidemia    GERD (gastroesophageal reflux disease)    History of diverticulitis of colon    Hypertension    Idiopathic scoliosis    Lumbosacral spondylosis    Mitral valve prolapse    Neuroma, Morton's    both feet,surgery done   Palpitations    hx. of   Psychosis (HCC)    Right carotid bruit    Ulcer    History of ulcers    Past Surgical History:  Procedure Laterality Date   cataracts     both eyes   EXCISION MORTON'S NEUROMA     both feet   LUMBAR FUSION     OTHER SURGICAL HISTORY     hysterectomy   OTHER SURGICAL HISTORY     left breast biopsy/lymph node   TOTAL KNEE ARTHROPLASTY  October 2002   right    Social History   Tobacco Use   Smoking status: Former   Smokeless tobacco: Never  Substance Use Topics   Alcohol use: No    Family  History  Problem Relation Age of Onset   Heart disease Father    Heart attack Father    Heart disease Mother    Breast cancer Neg Hx      Procedures:  7/24: intubated in ER   Significant Diagnostic Tests:  7/24: CTA chest pending   Micro Data:   Results for orders placed or performed during the hospital encounter of 06/24/21  Resp Panel by RT-PCR (Flu A&B, Covid) Nasopharyngeal Swab     Status: None   Collection Time: 06/24/21 11:04 PM   Specimen: Nasopharyngeal Swab; Nasopharyngeal(NP) swabs in vial transport medium  Result Value Ref Range Status   SARS Coronavirus 2 by RT PCR NEGATIVE NEGATIVE Final    Comment: (NOTE) SARS-CoV-2 target nucleic acids are NOT DETECTED.  The SARS-CoV-2 RNA is generally detectable in upper respiratory specimens during the acute phase of infection. The lowest concentration of SARS-CoV-2 viral copies this assay can detect is 138 copies/mL. A negative result does not preclude SARS-Cov-2 infection and should not be used as the sole basis for treatment or other patient management decisions. A negative result may occur with  improper specimen collection/handling, submission of specimen other than nasopharyngeal swab, presence of viral mutation(s) within the areas targeted by this assay, and inadequate number of viral copies(<138 copies/mL). A negative result  must be combined with clinical observations, patient history, and epidemiological information. The expected result is Negative.  Fact Sheet for Patients:  EntrepreneurPulse.com.au  Fact Sheet for Healthcare Providers:  IncredibleEmployment.be  This test is no t yet approved or cleared by the [place] FDA and  has been authorized for detection and/or diagnosis of SARS-CoV-2 by FDA under an Emergency Use Authorization (EUA). This EUA will remain  in effect (meaning this test can be used) for the duration of the COVID-19 declaration under Section  564(b)(1) of the Act, 21 U.S.C.section 360bbb-3(b)(1), unless the authorization is terminated  or revoked sooner.       Influenza A by PCR NEGATIVE NEGATIVE Final   Influenza B by PCR NEGATIVE NEGATIVE Final    Comment: (NOTE) The Xpert Xpress SARS-CoV-2/FLU/RSV plus assay is intended as an aid in the diagnosis of influenza from Nasopharyngeal swab specimens and should not be used as a sole basis for treatment. Nasal washings and aspirates are unacceptable for Xpert Xpress SARS-CoV-2/FLU/RSV testing.  Fact Sheet for Patients: EntrepreneurPulse.com.au  Fact Sheet for Healthcare Providers: IncredibleEmployment.be  This test is not yet approved or cleared by the [place] FDA and has been authorized for detection and/or diagnosis of SARS-CoV-2 by FDA under an Emergency Use Authorization (EUA). This EUA will remain in effect (meaning this test can be used) for the duration of the COVID-19 declaration under Section 564(b)(1) of the Act, 21 U.S.C. section 360bbb-3(b)(1), unless the authorization is terminated or revoked.  Performed at [facility], [place] [address], [place], [place] [address]       Antimicrobials:      Interim history/subjective:     Objective   BP 131/85   Pulse (!) 110   Temp 98.4 F (36.9 C)   Resp 19   Ht '5\' 2"'$  (1.575 m)   Wt 73.5 kg   SpO2 98%   BMI 29.63 kg/m     Filed Weights   06/24/21 2226  Weight: 73.5 kg    Intake/Output Summary (Last 24 hours) at 06/25/2021 0155 Last data filed at 06/24/2021 2334 Gross per 24 hour  Intake 499.5 ml  Output --  Net 499.5 ml    Vent Mode: PRVC FiO2 (%):  [60 %-100 %] 80 % Set Rate:  [15 bmp] 15 bmp Vt Set:  [400 mL] 400 mL PEEP:  [5 cmH20] 5 cmH20 Plateau Pressure:  [30 cmH20-36 cmH20] 36 cmH20   Examination: GENERAL: Intubated. Sedated. No acute distress. HEAD: normocephalic, atraumatic EYE: PERRLA, EOM intact, no scleral icterus,  no pallor. NOSE: nares are patent. No polyps. No exudate. No sinus tenderness. THROAT/ORAL CAVITY: Normal dentition. No oral thrush. No exudate. Mucous membranes are moist. No tonsillar enlargement. ETT in situ.  NECK: supple, no thyromegaly, no JVD, no lymphadenopathy. Trachea midline. CHEST/LUNG: symmetric in development and expansion. Good air entry. No crackles. No wheezes. HEART: Regular S1 and S2 without murmur, rub or gallop. ABDOMEN: soft, nontender, nondistended. Normoactive bowel sounds. No rebound. No guarding. No hepatosplenomegaly. EXTREMITIES: Edema: Trace. No cyanosis. No clubbing. 2+ DP pulses LYMPHATIC: no cervical/axillary/inguinal lymph nodes appreciated MUSCULOSKELETAL: No point tenderness. No bulk atrophy. Joints: normal inspection.  SKIN:  No rash or lesion. NEUROLOGIC:  Intubated, sedated.  Doll's eyes intact. Corneal reflex intact. Spontaneous respirations intact. Facial symmetry. Babinski absent. No sensory deficit. Motor: 5/5 @ RUE, 5/5 @ LUE, 5/5 @ RLL,  5/5 @ LLL.  DTR: 2+ @ R biceps, 2+ @ L biceps, 2+ @ R patellar,  2+ @  L patellar. No cerebellar signs. Gait was not assessed.   Resolved Hospital Problem list      Assessment & Plan:   ASSESSMENT/PLAN:  ASSESSMENT (included in the Hospital Problem List)  Principal Problem:   Acute hypoxemic respiratory failure (HCC) Active Problems:   Hemoptysis   Pneumoperitoneum of unknown etiology   Abnormal CXR   Leukocytosis   Pneumothorax on right   Hypertension   Subcutaneous emphysema (HCC)   By systems: PULMONARY Acute hypoxemic respiratory failure Bronchiectasis Hemoptysis vs pseudohemoptysis Reformed smoker Abnormal CXR, suspicious for aspiration Subcutaneous emphysema in neck Trach aspirate for Gram stain, C/S Quantiferon GOLD. Check BNP. Check ANA. Chest CTA shows extensive subcutaneous emphysema with dissection of tissue planes from chin to abdomen.  Additionally, there appears to be a small to  moderate LEFT pneumothorax and pneumoperitoneum.  Clinically, this patient is hypertensive and is not behaving as if her pneumothorax is under tension at this time.  Will obtain and time delayed CXR to monitor pneumothorax.  May need chest tube placement.   CARDIOVASCULAR Hypertension On amlodipine and valsartan at home. Vasotec IV 1.25 mg IV q 6 hr for now   RENAL: No acute issues   GASTROINTESTINAL Pneumoperitoneum Abdominal CT with PO contrast GI PROPHYLAXIS: Protonix NPO for now   HEMATOLOGIC Leokocytosis Trach aspirate for Gram stain, C/S. Blood cultures. U/A DVT PROPHYLAXIS: SCDs only for now with hemoptysis   INFECTIOUS Abnormal CXR without definite consolidation Empiric antibiotics: cefepime/Levaquin   ENDOCRINE: No acute issues   NEUROLOGIC Altered mental status Titrate sedation for RASS -2 tonight.   PLAN/RECOMMENDATIONS  Admit to ICU under my service (Attending: Renee Pain, MD) with the diagnoses highlighted above in the active Hospital Problem List (ASSESSMENT). See plan above.    My assessment, plan of care, findings, medications, side effects, etc. were discussed with: nurse and Dr. Sedonia Small (Emergency Medicine).   Best practice:  Diet: NPO Pain/Anxiety/Delirium protocol (if indicated): propofol/fentanyl VAP protocol (if indicated): YES DVT prophylaxis: SCDs GI prophylaxis: Protonix Glucose control: N/A Mobility/Activity: bedrest   Code Status: DNR.  I spoke to the patient's daughter (Mrs. Jacqulyn Cane) who relayed that the patient has repeated on numerous occasions that she is to be a DNR. Family Communication:   no family at bedside .  Daughter Jacqulyn Cane) updated by phone [phone]. Disposition: admit to ICU   Labs   CBC: Recent Labs  Lab 06/24/21 2230  WBC 22.2*  NEUTROABS 17.8*  HGB 13.7  HCT 41.0  MCV 90.7  PLT [id number]    Basic Metabolic Panel: Recent Labs  Lab 06/24/21 2230  NA 137  K 3.7  CL 103  CO2 23  GLUCOSE 163*   BUN 23  CREATININE 0.97  CALCIUM 9.0   GFR: Estimated Creatinine Clearance: 37.7 mL/min (by C-G formula based on SCr of 0.97 mg/dL). Recent Labs  Lab 06/24/21 2230  WBC 22.2*    Liver Function Tests: Recent Labs  Lab 06/24/21 2230  AST 27  ALT 21  ALKPHOS 41  BILITOT 0.5  PROT 7.9  ALBUMIN 4.5   No results for input(s): LIPASE, AMYLASE in the last 168 hours. No results for input(s): AMMONIA in the last 168 hours.  ABG    Component Value Date/Time   PHART 7.060 (LL) 06/24/2021 2358   PCO2ART 80.2 (HH) 06/24/2021 2358   PO2ART 221 (H) 06/24/2021 2358   HCO3 21.9 06/24/2021 2358   TCO2 26 03/31/2008 2113   ACIDBASEDEF 10.3 (H) 06/24/2021 2358   O2SAT 98.2 06/24/2021 2358  Coagulation Profile: Recent Labs  Lab 06/24/21 2303  INR 0.9    Cardiac Enzymes: No results for input(s): CKTOTAL, CKMB, CKMBINDEX, TROPONINI in the last 168 hours.  HbA1C: No results found for: HGBA1C  CBG: No results for input(s): GLUCAP in the last 168 hours.   Past Medical History   Past Medical History:  Diagnosis Date   Anxiety    Back pain    Blood loss anemia    Mild postoperative blood loss anemia   Dyslipidemia    GERD (gastroesophageal reflux disease)    History of diverticulitis of colon    Hypertension    Idiopathic scoliosis    Lumbosacral spondylosis    Mitral valve prolapse    Neuroma, Morton's    both feet,surgery done   Palpitations    hx. of   Psychosis ([place])    Right carotid bruit    Ulcer    History of ulcers      Surgical History    Past Surgical History:  Procedure Laterality Date   cataracts     both eyes   EXCISION MORTON'S NEUROMA     both feet   LUMBAR FUSION     OTHER SURGICAL HISTORY     hysterectomy   OTHER SURGICAL HISTORY     left breast biopsy/lymph node   TOTAL KNEE ARTHROPLASTY  October 2002   right      Social History   Social History   Socioeconomic History   Marital status: Widowed    Spouse name: Not on  file   Number of children: Not on file   Years of education: Not on file   Highest education level: Not on file  Occupational History   Not on file  Tobacco Use   Smoking status: Former   Smokeless tobacco: Never  Vaping Use   Vaping Use: Never used  Substance and Sexual Activity   Alcohol use: No   Drug use: No   Sexual activity: Not on file  Other Topics Concern   Not on file  Social History Narrative   She is widowed and has one daughter.    Social Determinants of Health   Financial Resource Strain: Not on file  Food Insecurity: Not on file  Transportation Needs: Not on file  Physical Activity: Not on file  Stress: Not on file  Social Connections: Not on file      Family History    Family History  Problem Relation Age of Onset   Heart disease Father    Heart attack Father    Heart disease Mother    Breast cancer Neg Hx    family history includes Heart attack in her father; Heart disease in her father and mother. There is no history of Breast cancer.    Allergies Allergies  Allergen Reactions   Percocet [Oxycodone-Acetaminophen]    Sulfamethoxazole-Trimethoprim     REACTION: SOB, itching,   Zocor [Simvastatin]       Current Medications  Current Facility-Administered Medications:    [COMPLETED] sodium chloride 0.9 % bolus 500 mL, 500 mL, Intravenous, Once, Stopped at 06/24/21 2334 **FOLLOWED BY** 0.9 %  sodium chloride infusion, 1,000 mL, Intravenous, Continuous, Dorie Rank, MD, Last Rate: 125 mL/hr at 06/24/21 2343, 1,000 mL at 06/24/21 2343   fentaNYL (SUBLIMAZE) bolus via infusion 25 mcg, 25 mcg, Intravenous, Q1H PRN, Dorie Rank, MD, 25 mcg at 06/25/21 0138   fentaNYL 2585mg in NS 2531m(1078mml) infusion-PREMIX, 25-400 mcg/hr, Intravenous, Continuous, KnaDorie RankD, Last Rate:  7.5 mL/hr at 06/25/21 0024, 75 mcg/hr at 06/25/21 0024   iohexol (OMNIPAQUE) 350 MG/ML injection 80 mL, 80 mL, Intravenous, Once PRN, Dorie Rank, MD   propofol (DIPRIVAN) 1000  MG/100ML infusion, 0-50 mcg/kg/min, Intravenous, Continuous, Dorie Rank, MD, Last Rate: 19.85 mL/hr at 06/25/21 0134, 45 mcg/kg/min at 06/25/21 0134  Current Outpatient Medications:    amLODipine (NORVASC) 5 MG tablet, Take 1 tablet (5 mg total) by mouth daily., Disp: 30 tablet, Rfl: 6   aspirin 81 MG tablet, Take 81 mg by mouth daily., Disp: , Rfl:    valsartan (DIOVAN) 40 MG tablet, TAKE 1 TABLET BY MOUTH EVERY DAY, Disp: 90 tablet, Rfl: 3   Home Medications  Prior to Admission medications   Medication Sig Start Date End Date Taking? Authorizing Provider  amLODipine (NORVASC) 5 MG tablet Take 1 tablet (5 mg total) by mouth daily. 01/26/21 04/26/21  Deberah Pelton, NP  aspirin 81 MG tablet Take 81 mg by mouth daily.    [provider]  valsartan (DIOVAN) 40 MG tablet TAKE 1 TABLET BY MOUTH EVERY DAY 04/25/21   [place], Peter M, MD     Critical care time: 60 minutes.  The treatment and management of the patient's condition was required based on the threat of imminent deterioration. This time reflects time spent by the physician evaluating, providing care and managing the critically ill patient's care. The time was spent at the immediate bedside (or on the same floor/unit and dedicated to this patient's care). Time involved in separately billable procedures is NOT included int he critical care time indicated above. Family meeting and update time may be included above if and only if the patient is unable/incompetent to participate in clinical interview and/or decision making, and the discussion was necessary to determining treatment decisions.   Renee Pain, MD Board Certified by the ABIM, [place]

## 2021-06-26 ENCOUNTER — Inpatient Hospital Stay (HOSPITAL_COMMUNITY): Payer: Medicare HMO

## 2021-06-26 ENCOUNTER — Encounter (HOSPITAL_COMMUNITY): Payer: Self-pay | Admitting: Pulmonary Disease

## 2021-06-26 DIAGNOSIS — J9601 Acute respiratory failure with hypoxia: Secondary | ICD-10-CM

## 2021-06-26 DIAGNOSIS — R778 Other specified abnormalities of plasma proteins: Secondary | ICD-10-CM

## 2021-06-26 LAB — BLOOD GAS, ARTERIAL
Acid-base deficit: 4.8 mmol/L — ABNORMAL HIGH (ref 0.0–2.0)
Bicarbonate: 20.3 mmol/L (ref 20.0–28.0)
Drawn by: 59133
FIO2: 50
MECHVT: 400 mL
O2 Saturation: 97.5 %
Patient temperature: 98.6
RATE: 15 resp/min
pCO2 arterial: 40.5 mmHg (ref 32.0–48.0)
pH, Arterial: 7.322 — ABNORMAL LOW (ref 7.350–7.450)
pO2, Arterial: 90.4 mmHg (ref 83.0–108.0)

## 2021-06-26 LAB — BASIC METABOLIC PANEL
Anion gap: 8 (ref 5–15)
Anion gap: 10 (ref 5–15)
BUN: 30 mg/dL — ABNORMAL HIGH (ref 8–23)
BUN: 29 mg/dL — ABNORMAL HIGH (ref 8–23)
CO2: 20 mmol/L — ABNORMAL LOW (ref 22–32)
CO2: 21 mmol/L — ABNORMAL LOW (ref 22–32)
Calcium: 8 mg/dL — ABNORMAL LOW (ref 8.9–10.3)
Calcium: 8.4 mg/dL — ABNORMAL LOW (ref 8.9–10.3)
Chloride: 105 mmol/L (ref 98–111)
Chloride: 106 mmol/L (ref 98–111)
Creatinine, Ser: 1.47 mg/dL — ABNORMAL HIGH (ref 0.44–1.00)
Creatinine, Ser: 1.15 mg/dL — ABNORMAL HIGH (ref 0.44–1.00)
GFR, Estimated: 34 mL/min — ABNORMAL LOW (ref >60)
GFR, Estimated: 46 mL/min — ABNORMAL LOW (ref >60)
Glucose, Bld: 127 mg/dL — ABNORMAL HIGH (ref 70–99)
Glucose, Bld: 96 mg/dL (ref 70–99)
Potassium: 5.8 mmol/L — ABNORMAL HIGH (ref 3.5–5.1)
Potassium: 3.9 mmol/L (ref 3.5–5.1)
Sodium: 133 mmol/L — ABNORMAL LOW (ref 135–145)
Sodium: 137 mmol/L (ref 135–145)

## 2021-06-26 LAB — ECHOCARDIOGRAM COMPLETE
Height: 62 in
Single Plane A4C EF: 61.9 %
Weight: 2832.47 oz

## 2021-06-26 LAB — ANA W/REFLEX IF POSITIVE: Anti Nuclear Antibody (ANA): NEGATIVE

## 2021-06-26 LAB — TROPONIN I (HIGH SENSITIVITY): Troponin I (High Sensitivity): 1372 ng/L (ref <18)

## 2021-06-26 MED ORDER — PERFLUTREN LIPID MICROSPHERE
1.0000 mL | INTRAVENOUS | Status: AC | PRN
  Administered 2021-06-26: 2 mL via INTRAVENOUS
  Filled 2021-06-26: qty 10

## 2021-06-26 MED ORDER — SODIUM CHLORIDE 0.9 % IV SOLN
3.0000 g | Freq: Two times a day (BID) | INTRAVENOUS | Status: DC
  Administered 2021-06-26 – 2021-06-27 (×2): 3 g via INTRAVENOUS
  Filled 2021-06-26 (×2): qty 8

## 2021-06-26 MED ORDER — ORAL CARE MOUTH RINSE
15.0000 mL | Freq: Two times a day (BID) | OROMUCOSAL | Status: DC
  Administered 2021-06-26 – 2021-08-02 (×61): 15 mL via OROMUCOSAL

## 2021-06-26 MED ORDER — ACETAMINOPHEN 650 MG RE SUPP
650.0000 mg | RECTAL | Status: DC | PRN
  Administered 2021-06-26: 650 mg via RECTAL
  Filled 2021-06-26: qty 1

## 2021-06-26 MED ORDER — SODIUM CHLORIDE 0.9 % IV SOLN
INTRAVENOUS | Status: DC | PRN
  Administered 2021-06-26 – 2021-07-11 (×2): 500 mL via INTRAVENOUS

## 2021-06-26 NOTE — Progress Notes (Signed)
NAME:  Mary Strickland, MRN:  [id number], DOB:  01-02-1930, LOS: 1 ADMISSION DATE:  06/24/2021, CONSULTATION DATE:  06/24/21 REFERRING MD:  ED Doc, CHIEF COMPLAINT:  hemoptysis   History of Present Illness:  This 87 y.o. Caucasian female reformed smoker presented to the [facility] Emergency Department via EMS with complaints of acute onset hemoptysis.  She reported that she awoke from her sleep and experienced a bout of hemoptysis.  EMS noted coarse breath sounds.  She was hypertensive.  She denied any chest pain.  She was placed on supplemental oxygen and transported to [facility].  In the ER, she was initially observed to be hemodynamically stable but had a sudden bout of oxygen desaturation to the 80s and demonstrated altered mental status/confusion, prompting endotracheal intubation.  Has pneumomediastinum, pneumothorax Requiring pressors  Pertinent  Medical History   Past Medical History:  Diagnosis Date   Anxiety    Back pain    Blood loss anemia    Mild postoperative blood loss anemia   Dyslipidemia    GERD (gastroesophageal reflux disease)    History of diverticulitis of colon    Hypertension    Idiopathic scoliosis    Lumbosacral spondylosis    Mitral valve prolapse    Neuroma, Morton's    both feet,surgery done   Palpitations    hx. of   Psychosis (HCC)    Right carotid bruit    Ulcer    History of ulcers   Significant Hospital Events: Including procedures, antibiotic start and stop dates in addition to other pertinent events   CT scan with pneumomediastinum, pneumothorax, extensive soft tissue gas in the abdominal wall 7/25 central line placed, left chest tube placed  Interim History / Subjective:  Critically ill Requiring pressors being weaned Awake and interactive  Objective   Blood pressure (!) 129/46, pulse 84, temperature 98.78 F (37.1 C), resp. rate 17, height '5\' 2"'$  (1.575 m), weight 80.3 kg, SpO2 94 %.    Vent Mode:  PRVC FiO2 (%):  [50 %] 50 % Set Rate:  [15 bmp] 15 bmp Vt Set:  [400 mL] 400 mL PEEP:  [5 cmH20] 5 cmH20 Plateau Pressure:  [15 cmH20-16 cmH20] 16 cmH20   Intake/Output Summary (Last 24 hours) at 06/26/2021 [id number] Last data filed at 06/26/2021 0744 Gross per 24 hour  Intake 2126.35 ml  Output 975 ml  Net 1151.35 ml   Filed Weights   06/24/21 2226 06/25/21 0500 06/26/21 0500  Weight: 73.5 kg 75.2 kg 80.3 kg    Examination: General: Elderly lady, frail  HENT: Endotracheal tube in place, moist oral mucosa, subcutaneous emphysema appears stable  lungs: Decreased air movement bilaterally Cardiovascular: S1-S2 appreciated Abdomen: Bowel sounds appreciated Extremities: No edema, no clubbing Neuro:  awake and interactive, follows commands GU: Fair output   CT scan of the chest reviewed by myself Chest x-ray this morning 0855 reviewed   Potassium this morning of 5.8-Specimen documented as hemolyzed -Redraw Resolved Hospital Problem list     Assessment & Plan:   Acute hypoxemic respiratory failure Continue mechanical ventilation Target plateau pressure less than 30 cmH2O Target driving pressure less than 15 cmH2O Ventilator associated pneumonia prevention protocol  Hemoptysis -Source likely GI -Not being suctioned for any blood -Unclear whether hemoptysis is pseudohemoptysis -Coffee grounds noted in NG tube -On PPI twice daily  Pneumomediastinum Pneumothorax -Chest tube in place -Chest x-ray does appear stable with no pneumothorax, does still have pneumomediastinum  Hypertension -Continue to hold home antihypertensive  Possible pneumonia -Continue antibiotics -Tracheal aspirate pending -Still with significant nephric and leukocytosis although may be stress related  Titrate sedation to RASS 0 to -1 Follow arterial blood gases Follow cultures  Follow troponin  Appears to be stabilizing  risk of decompensation is still significant   Best Practice (right  click and "Reselect all SmartList Selections" daily)   Diet/type: NPO DVT prophylaxis: prophylactic heparin  GI prophylaxis: PPI Lines: N/A Foley:  N/A Code Status:  DNR Last date of multidisciplinary goals of care discussion [discussed with patient's daughter 06/25/2021]  Labs   CBC: Recent Labs  Lab 06/24/21 2230 06/25/21 0727  WBC 22.2* 28.5*  NEUTROABS 17.8*  --   HGB 13.7 12.3  HCT 41.0 37.6  MCV 90.7 93.5  PLT 299 [id number]    Basic Metabolic Panel: Recent Labs  Lab 06/24/21 2230 06/25/21 0727 06/26/21 0545  NA 137 136 133*  K 3.7 4.4 5.8*  CL 103 105 105  CO2 23 20* 20*  GLUCOSE 163* 132* 127*  BUN 23 23 30*  CREATININE 0.97 1.05* 1.47*  CALCIUM 9.0 8.2* 8.0*  MG  --  1.8  --    GFR: Estimated Creatinine Clearance: 26 mL/min (A) (by C-G formula based on SCr of 1.47 mg/dL (H)). Recent Labs  Lab 06/24/21 2230 06/25/21 0727  WBC 22.2* 28.5*    Liver Function Tests: Recent Labs  Lab 06/24/21 2230  AST 27  ALT 21  ALKPHOS 41  BILITOT 0.5  PROT 7.9  ALBUMIN 4.5   No results for input(s): LIPASE, AMYLASE in the last 168 hours. No results for input(s): AMMONIA in the last 168 hours.  ABG    Component Value Date/Time   PHART 7.322 (L) 06/26/2021 0545   PCO2ART 40.5 06/26/2021 0545   PO2ART 90.4 06/26/2021 0545   HCO3 20.3 06/26/2021 0545   TCO2 26 03/31/2008 2113   ACIDBASEDEF 4.8 (H) 06/26/2021 0545   O2SAT 97.5 06/26/2021 0545     Coagulation Profile: Recent Labs  Lab 06/24/21 2303  INR 0.9    Cardiac Enzymes: No results for input(s): CKTOTAL, CKMB, CKMBINDEX, TROPONINI in the last 168 hours.  HbA1C: No results found for: HGBA1C  CBG: No results for input(s): GLUCAP in the last 168 hours.  Review of Systems:   Arousable, denies pain  Past Medical History:  She,  has a past medical history of Anxiety, Back pain, Blood loss anemia, Dyslipidemia, GERD (gastroesophageal reflux disease), History of diverticulitis of colon,  Hypertension, Idiopathic scoliosis, Lumbosacral spondylosis, Mitral valve prolapse, Neuroma, Morton's, Palpitations, Psychosis ([place]), Right carotid bruit, and Ulcer.   Surgical History:   Past Surgical History:  Procedure Laterality Date   cataracts     both eyes   EXCISION MORTON'S NEUROMA     both feet   LUMBAR FUSION     OTHER SURGICAL HISTORY     hysterectomy   OTHER SURGICAL HISTORY     left breast biopsy/lymph node   TOTAL KNEE ARTHROPLASTY  October 2002   right     Social History:   reports that she has quit smoking. She has never used smokeless tobacco. She reports that she does not drink alcohol and does not use drugs.   Family History:  Her family history includes Heart attack in her father; Heart disease in her father and mother. There is no history of Breast cancer.   Allergies Allergies  Allergen Reactions   Percocet [Oxycodone-Acetaminophen]    Sulfamethoxazole-Trimethoprim     REACTION: SOB, itching,  Zocor [Simvastatin]      Home Medications  Prior to Admission medications   Medication Sig Start Date End Date Taking? Authorizing Provider  amLODipine (NORVASC) 5 MG tablet Take 1 tablet (5 mg total) by mouth daily. 01/26/21 04/26/21  Deberah Pelton, NP  aspirin 81 MG tablet Take 81 mg by mouth daily.    [provider]  valsartan (DIOVAN) 40 MG tablet TAKE 1 TABLET BY MOUTH EVERY DAY 04/25/21   Martinique, Peter M, MD    The patient is critically ill with multiple organ systems failure and requires high complexity decision making for assessment and support, frequent evaluation and titration of therapies, application of advanced monitoring technologies and extensive interpretation of multiple databases. Critical Care Time devoted to patient care services described in this note independent of APP/resident time (if applicable)  is 32 minutes.   Sherrilyn Rist MD [place] [facility] Personal pager: See Amion If unanswered, please page CCM  On-call: [phone]

## 2021-06-26 NOTE — TOC Initial Note (Signed)
Transition of Care [facility]) - Initial/Assessment Note    Patient Details  Name: Mary Strickland MRN: [id number] Date of Birth: 1930-05-15  [facility]) CM/SW Contact:    Leeroy Cha, RN Phone Number: 06/26/2021, 8:34 AM  Clinical Narrative:                 87 y.o. Caucasian female reformed smoker presented to the [facility] Emergency Department via EMS with complaints of acute onset hemoptysis.  She reported that she awoke from her sleep and experienced a bout of hemoptysis.  EMS noted coarse breath sounds.  She was hypertensive.  She denied any chest pain.  She was placed on supplemental oxygen and transported to [facility].  In the ER, she was initially observed to be hemodynamically stable but had a sudden bout of oxygen desaturation to the 80s and demonstrated altered mental status/confusion, prompting endotracheal intubation. TOC PLAN OF CARE: Iv sedation and pressors,has Left Chest tube inserted on 072422. Expected Discharge Plan: Home/Self Care Barriers to Discharge: Continued Medical Work up   Patient Goals and CMS Choice Patient states their goals for this hospitalization and ongoing recovery are:: intubated CMS Medicare.gov Compare Post Acute Care list provided to:: Patient    Expected Discharge Plan and Services Expected Discharge Plan: Home/Self Care   Discharge Planning Services: CM Consult   Living arrangements for the past 2 months: Single Family Home                                      Prior Living Arrangements/Services Living arrangements for the past 2 months: Single Family Home Lives with:: Self                   Activities of Daily Living      Permission Sought/Granted                  Emotional Assessment   Attitude/Demeanor/Rapport: Intubated (Following Commands or Not Following Commands) Affect (typically observed): Unable to Assess Orientation: : Oriented to Self, Oriented to Place,  Oriented to  Time, Oriented to Situation Alcohol / Substance Use: Not Applicable Psych Involvement: No (comment)  Admission diagnosis:  Acute respiratory distress [R06.03] Encounter for imaging study to confirm orogastric (OG) tube placement [Z01.89] Hemoptysis [R04.2] Acute hypoxemic respiratory failure ([place]) [J96.01] Patient Active Problem List   Diagnosis Date Noted   Hemoptysis 06/25/2021   Acute hypoxemic respiratory failure ([place]) 06/25/2021   Abnormal CXR 06/25/2021   Leukocytosis 06/25/2021   Subcutaneous emphysema ([place]) 06/25/2021   Pneumoperitoneum of unknown etiology 06/25/2021   Pneumothorax on right 06/25/2021   Hypertension 12/18/2011   Dyslipidemia    Palpitations    GERD (gastroesophageal reflux disease)    Neuroma, Morton's    Back pain    Right carotid bruit    PCP:  Burnard Bunting, MD Pharm[id number] [address] [address]Phone: [phone]:[phone] (Now [facility]) - [address] [address]Phone: [phone]: [phone] [address] [place] [address]Phone: [phone]: [phone] [facility] [place] [address] [facility] to Registered  [employer] Sites [place] [address] Phone: [phone] Fax: [phone]     Social Determinants of Health (SDOH) Interventions    Readmission Risk Interventions No flowsheet data found.

## 2021-06-26 NOTE — Progress Notes (Signed)
Initial Nutrition Assessment  DOCUMENTATION CODES:   Not applicable  INTERVENTION:  - if patient unable to be extubated in the next 24 hours, recommend initiation of TF: Vital AF 1.2 @ 50 ml/hr. - this regimen, without kcal from propofol, will provide 1440 kcal, 90 grams protein, and 973 ml free water.    NUTRITION DIAGNOSIS:   Inadequate oral intake related to inability to eat as evidenced by NPO status.  GOAL:   Patient will meet greater than or equal to 90% of their needs  MONITOR:   Vent status, Labs, Weight trends  REASON FOR ASSESSMENT:   Ventilator  ASSESSMENT:   87 y.o. female who is a former smoker and has medical history of HTN, dyslipidemia, GERD, Morton's neuroma, idiopathic scoliosis, mitral valve prolapse, anxiety, psychosis, and diverticulitis. She presented to the ED via EMS d/t acute onset of hemoptysis. In the ED she was initially hemodynamically stable but then experienced sudden episode of O2 desaturation into the 80s and became altered with confusion. She was subsequently intubated in the ED.  Patient was discussed in rounds this AM. She is currently weaning.   No family or visitors present. She has OGT (gastric placement) to LIS with 125 ml very dark output.   Weight today is 177 lb and weight on admission 7/23 was 162 lb. Used admission weight to estimate needs as this is consistent with weights over the past 9 months.   Per notes: - hemoptysis thought to be GI-related - pneumomediastinum with pneumothorax s/p chest tube placement - possible PNA   Patient is currently intubated on ventilator support MV: 7.7 L/min Temp (24hrs), Avg:98.2 F (36.8 C), Min:97 F (36.1 C), Max:99.86 F (37.7 C) Propofol: 6.6 ml/hr (174 kcal/24 hrs) BP: 142/55 and MAP: 81  Labs reviewed; BUN: 29 mg/dl, creatinine: 1.15 mg/dl, Ca: 8.4 mg/dl, GFR: 46 ml/min. Medications reviewed; 100 mg colace BID, 40 mg IV protonix BID, 17 g miralax/day.   IVF; NS @ 125  ml/hr. Drips; fentanyl @ 25 mcg/hr and propofol @ 15 mcg/kg/min.    NUTRITION - FOCUSED PHYSICAL EXAM:  Completed; no muscle or fat depletions, mild edema to BLE.   Diet Order:   Diet Order             Diet NPO time specified  Diet effective now                   EDUCATION NEEDS:   No education needs have been identified at this time  Skin:  Skin Assessment: Reviewed RN Assessment  Last BM:  PTA/unknown  Height:   Ht Readings from Last 1 Encounters:  06/25/21 '5\' 2"'$  (1.575 m)    Weight:   Wt Readings from Last 1 Encounters:  06/26/21 80.3 kg     Estimated Nutritional Needs:  Kcal:  1402 kcal Protein:  88-110 grams Fluid:  >/= 1.5 L/day       Jarome Matin, MS, RD, LDN, CNSC Inpatient Clinical Dietitian RD pager # available in AMION  After hours/weekend pager # available in [facility]

## 2021-06-26 NOTE — Progress Notes (Signed)
Asked to see patient  She denies any discomfort at the present time  I did explain to her with trying to get the tube out and she may feel more comfortable with the tube out  Has weaned well during the morning  We will go ahead and extubate and monitor

## 2021-06-26 NOTE — Progress Notes (Addendum)
Patient is weaning well  Troponin noted-likely related to demand Will obtain an echocardiogram  Potassium is normal  Antibiotic de-escalated to Unasyn-possible aspiration/pneumonia  Place order for extubation

## 2021-06-26 NOTE — Progress Notes (Signed)
Wean trial started 5/5 40%. Patient tolerating well at this time.  No complications noted.

## 2021-06-26 NOTE — Progress Notes (Signed)
Patient complaining of chest pain, EKG obtained, reported results to E-link and received call back from MD. EKG not transferring over in the computer and asked by MD to fax over. EKG faxed. Night shift RN aware.

## 2021-06-26 NOTE — Progress Notes (Signed)
[place] Progress Note Patient Name: Mary Strickland DOB: 01-20-1930 MRN: [id number]   Date of Service  06/26/2021  HPI/Events of Note  Received request for am BMP [place] CO2 20, creatinine 1.05  eICU Interventions  BMP in am ordered     Intervention Category Intermediate Interventions: Other:  Judd Lien 06/26/2021, 1:53 AM

## 2021-06-26 NOTE — Progress Notes (Signed)
30 ml of fentaNYL 2577mg in NS 2541m(1063mml) infusion wasted in stericycle. Verified with MicMilinda HirschfeldN

## 2021-06-26 NOTE — Progress Notes (Signed)
eLink Physician-Brief Progress Note Patient Name: Mary Strickland DOB: 1930/03/18 MRN: [id number]   Date of Service  06/26/2021  HPI/Events of Note  Pt c/o mild chest discomfort so EKG was obtained. I reviewed EKG and there is no STE. Patient has experienced an NSTEMI/demand ischemia but has a contraindication to heparinization (presented this admission with hemoptysis that required intubation). NSTEMI/demand ischemia was known to day team. The chest pain may or may not be related to the demand ischemia since the patient also has extensive pneumomediastinum.   eICU Interventions  Continue current conservative management + pain control.     Intervention Category Intermediate Interventions: Diagnostic test evaluation  Marily Lente Isador Castille 06/26/2021, 7:56 PM

## 2021-06-26 NOTE — Progress Notes (Signed)
  Echocardiogram 2D Echocardiogram has been performed.  Mary Strickland 06/26/2021, 2:02 PM

## 2021-06-26 NOTE — Progress Notes (Signed)
Extubation done at this time.  Patient is able to speak, say name and place. Patient placed on 12L HFNC at this time to maintain SPO2 of 90%.  Patient is able to cough, CPT through bed done at this time.  RT will continue to monitor.

## 2021-06-27 ENCOUNTER — Inpatient Hospital Stay (HOSPITAL_COMMUNITY): Payer: Medicare HMO

## 2021-06-27 DIAGNOSIS — T8249XA Other complication of vascular dialysis catheter, initial encounter: Secondary | ICD-10-CM

## 2021-06-27 DIAGNOSIS — J9601 Acute respiratory failure with hypoxia: Secondary | ICD-10-CM | POA: Diagnosis not present

## 2021-06-27 DIAGNOSIS — Y711 Therapeutic (nonsurgical) and rehabilitative cardiovascular devices associated with adverse incidents: Secondary | ICD-10-CM

## 2021-06-27 LAB — MAGNESIUM: Magnesium: 2.1 mg/dL (ref 1.7–2.4)

## 2021-06-27 LAB — BASIC METABOLIC PANEL
Anion gap: 8 (ref 5–15)
Anion gap: 10 (ref 5–15)
BUN: 28 mg/dL — ABNORMAL HIGH (ref 8–23)
BUN: 27 mg/dL — ABNORMAL HIGH (ref 8–23)
CO2: 22 mmol/L (ref 22–32)
CO2: 21 mmol/L — ABNORMAL LOW (ref 22–32)
Calcium: 8.3 mg/dL — ABNORMAL LOW (ref 8.9–10.3)
Calcium: 8.1 mg/dL — ABNORMAL LOW (ref 8.9–10.3)
Chloride: 108 mmol/L (ref 98–111)
Chloride: 103 mmol/L (ref 98–111)
Creatinine, Ser: 1.12 mg/dL — ABNORMAL HIGH (ref 0.44–1.00)
Creatinine, Ser: 1.06 mg/dL — ABNORMAL HIGH (ref 0.44–1.00)
GFR, Estimated: 47 mL/min — ABNORMAL LOW (ref >60)
GFR, Estimated: 51 mL/min — ABNORMAL LOW (ref >60)
Glucose, Bld: 86 mg/dL (ref 70–99)
Glucose, Bld: 138 mg/dL — ABNORMAL HIGH (ref 70–99)
Potassium: 3.9 mmol/L (ref 3.5–5.1)
Potassium: 3.4 mmol/L — ABNORMAL LOW (ref 3.5–5.1)
Sodium: 138 mmol/L (ref 135–145)
Sodium: 134 mmol/L — ABNORMAL LOW (ref 135–145)

## 2021-06-27 LAB — BLOOD GAS, VENOUS
Acid-base deficit: 1.6 mmol/L (ref 0.0–2.0)
Bicarbonate: 21.8 mmol/L (ref 20.0–28.0)
O2 Saturation: 90.5 %
Patient temperature: 98.6
pCO2, Ven: 33.7 mmHg — ABNORMAL LOW (ref 44.0–60.0)
pH, Ven: 7.426 (ref 7.250–7.430)
pO2, Ven: 57.9 mmHg — ABNORMAL HIGH (ref 32.0–45.0)

## 2021-06-27 LAB — RESPIRATORY PANEL BY PCR

## 2021-06-27 LAB — CBC WITH DIFFERENTIAL/PLATELET
Abs Immature Granulocytes: 0.35 10*3/uL — ABNORMAL HIGH (ref 0.00–0.07)
Basophils Absolute: 0.1 10*3/uL (ref 0.0–0.1)
Basophils Relative: 0 %
Eosinophils Absolute: 0.1 10*3/uL (ref 0.0–0.5)
Eosinophils Relative: 0 %
HCT: 29.4 % — ABNORMAL LOW (ref 36.0–46.0)
Hemoglobin: 10 g/dL — ABNORMAL LOW (ref 12.0–15.0)
Immature Granulocytes: 2 %
Lymphocytes Relative: 7 %
Lymphs Abs: 1.4 10*3/uL (ref 0.7–4.0)
MCH: 31 pg (ref 26.0–34.0)
MCHC: 34 g/dL (ref 30.0–36.0)
MCV: 91 fL (ref 80.0–100.0)
Monocytes Absolute: 1.4 10*3/uL — ABNORMAL HIGH (ref 0.1–1.0)
Monocytes Relative: 7 %
Neutro Abs: 16.5 10*3/uL — ABNORMAL HIGH (ref 1.7–7.7)
Neutrophils Relative %: 84 %
Platelets: 153 10*3/uL (ref 150–400)
RBC: 3.23 MIL/uL — ABNORMAL LOW (ref 3.87–5.11)
RDW: 14.5 % (ref 11.5–15.5)
WBC: 19.7 10*3/uL — ABNORMAL HIGH (ref 4.0–10.5)
nRBC: 0 % (ref 0.0–0.2)

## 2021-06-27 LAB — STREP PNEUMONIAE URINARY ANTIGEN: Strep Pneumo Urinary Antigen: NEGATIVE

## 2021-06-27 LAB — SEDIMENTATION RATE: Sed Rate: 54 mm/hr — ABNORMAL HIGH (ref 0–22)

## 2021-06-27 LAB — GLUCOSE, CAPILLARY
Glucose-Capillary: 193 mg/dL — ABNORMAL HIGH (ref 70–99)
Glucose-Capillary: 175 mg/dL — ABNORMAL HIGH (ref 70–99)

## 2021-06-27 LAB — LACTIC ACID, PLASMA: Lactic Acid, Venous: 1 mmol/L (ref 0.5–1.9)

## 2021-06-27 LAB — PROCALCITONIN: Procalcitonin: 0.7 ng/mL

## 2021-06-27 LAB — CREATININE, SERUM
Creatinine, Ser: 1.13 mg/dL — ABNORMAL HIGH (ref 0.44–1.00)
GFR, Estimated: 47 mL/min — ABNORMAL LOW (ref >60)

## 2021-06-27 MED ORDER — IOHEXOL 350 MG/ML SOLN
60.0000 mL | Freq: Once | INTRAVENOUS | Status: AC | PRN
  Administered 2021-06-27: 60 mL via INTRAVENOUS

## 2021-06-27 MED ORDER — ADULT MULTIVITAMIN W/MINERALS CH
1.0000 | ORAL_TABLET | Freq: Every day | ORAL | Status: DC
  Administered 2021-06-27 – 2021-08-03 (×38): 1 via ORAL
  Filled 2021-06-27 (×38): qty 1

## 2021-06-27 MED ORDER — BOOST / RESOURCE BREEZE PO LIQD CUSTOM
1.0000 | Freq: Two times a day (BID) | ORAL | Status: DC
  Administered 2021-06-28: 1 via ORAL

## 2021-06-27 MED ORDER — PROSOURCE PLUS PO LIQD
30.0000 mL | Freq: Two times a day (BID) | ORAL | Status: DC
  Administered 2021-06-27: 30 mL via ORAL
  Filled 2021-06-27 (×3): qty 30

## 2021-06-27 MED ORDER — POTASSIUM CHLORIDE 20 MEQ PO PACK
40.0000 meq | PACK | Freq: Once | ORAL | Status: AC
  Administered 2021-06-28: 40 meq via ORAL
  Filled 2021-06-27: qty 2

## 2021-06-27 MED ORDER — AMPICILLIN-SULBACTAM SODIUM 3 (2-1) G IJ SOLR
3.0000 g | Freq: Three times a day (TID) | INTRAMUSCULAR | Status: AC
  Administered 2021-06-27 – 2021-07-05 (×26): 3 g via INTRAVENOUS
  Filled 2021-06-27: qty 3
  Filled 2021-06-27 (×8): qty 8
  Filled 2021-06-27: qty 3
  Filled 2021-06-27 (×5): qty 8
  Filled 2021-06-27: qty 3
  Filled 2021-06-27 (×3): qty 8
  Filled 2021-06-27: qty 3
  Filled 2021-06-27: qty 8
  Filled 2021-06-27: qty 3
  Filled 2021-06-27 (×4): qty 8

## 2021-06-27 MED ORDER — DEXTROSE IN LACTATED RINGERS 5 % IV SOLN: INTRAVENOUS | Status: DC

## 2021-06-27 MED ORDER — HYDRALAZINE HCL 20 MG/ML IJ SOLN
10.0000 mg | INTRAMUSCULAR | Status: DC | PRN
  Administered 2021-06-27: 20 mg via INTRAVENOUS
  Filled 2021-06-27: qty 1

## 2021-06-27 NOTE — Progress Notes (Signed)
NUTRITION NOTE  Patient seen by this RD yesterday while she was still intubated. She was extubated and OGT removed yesterday at ~1330.   Diet has been advanced to CLD. Will order Boost Breeze BID, each supplement provides 250 kcal and 9 grams of protein and will order 30 ml Prosource Plus BID, each supplement provides 100 kcal and 15 grams protein.   Patient transferring from 2W at [facility] to 10M at [facility] via [place] at this time.   RD will continue to follow.     Jarome Matin, MS, RD, LDN, CNSC Inpatient Clinical Dietitian RD pager # available in [place]  After hours/weekend pager # available in [facility]

## 2021-06-27 NOTE — Progress Notes (Signed)
[facility] ADULT ICU REPLACEMENT PROTOCOL   The patient does apply for the [facility] Adult ICU Electrolyte Replacment Protocol based on the criteria listed below:   1.Exclusion criteria: TCTS patients, ECMO patients and Hypothermia Protocol, and   Dialysis patients 2. Is GFR >/= 30 ml/min? Yes.    Patient's GFR today is 51 3. Is SCr </= 2? Yes.   Patient's SCr is 1.06 mg/dL 4. Did SCr increase >/= 0.5 in 24 hours? No. 5.Pt's weight >40kg  Yes.   6. Abnormal electrolyte(s): K+ 3.4  7. Electrolytes replaced per protocol 8.  Call MD STAT for K+ </= 2.5, Phos </= 1, or Mag </= 1 Physician:  n/a  Darlys Gales 06/27/2021 11:58 PM

## 2021-06-27 NOTE — Progress Notes (Signed)
Pt arrived at [facility]  Stable  But a little on hypertensive side so hydralazine added PRN Vascular surg notified of arrival. Left IJ being removed,   Erick Colace ACNP-BC [place] Pager # [phone] OR # [phone] if no answer

## 2021-06-27 NOTE — Progress Notes (Signed)
    Called daughter and updated about the malposition of the left IJ central line.  And plan to transfer to [facility] for removal.  The daughter is overwhelmed with all the information.  She appeared and sounded stressed.  She acknowledged the level of stress the news is causing her.  She asked for her daughter which is the granddaughter of the patient to be the primary contact she wants regular update  Continue DNR confirmed  Plan Put candace ayers gradaughter as primary contact because dtr is overwhelmed [phone] Tx to [facility] for tx DNR Dt r to get regular updates  Ccm additoin 94 min     SIGNATURE    Dr. Brand Males, M.D., F.C.C.P,  Pulmonary and Critical Care Medicine Staff Physician, [place] Director - Interstitial Lung Disease  Program  Pulmonary [place] at [place], [place], [address]  NPI Number:  NPI [id number]  Pager: [phone], If no answer  -> Check AMION or Try [phone] Telephone (clinical office): [phone] Telephone (research): [phone]  10:52 AM 06/27/2021

## 2021-06-27 NOTE — Progress Notes (Signed)
eLink Physician-Brief Progress Note Patient Name: Mary Strickland DOB: [birth year] MRN: [id number]   Date of Service  06/27/2021  HPI/Events of Note  Patient transferred with chest tube. No orders for chest tube.   eICU Interventions  Plan: Chest tube to -20 cm suction via Pleuravac.      Intervention Category Major Interventions: Other:  Lysle Dingwall 06/27/2021, 9:23 PM

## 2021-06-27 NOTE — Progress Notes (Signed)
PCCM Interval Progress Note  Asked by Dr. Chase Caller to assist with assessing L IJ CVL for concern of malpositioning.  Assessed under [name] and appeared that catheter lumen was in left carotid.  ABG obtained which suggested intra-arterial placement (pO2 58 with SpO2 91 and on 10L O2). Line connected to arterial pressure bag and monitor confirmed arterial waveform with pressures 140s/90s).  I called Dr. Scot Dock with vascular surgery for input.  Discussed two options of pulling line and holding manual pressure for extended period of time versus OR removal.  Issue is that if there were any complications, pt would need to be at [facility] for OR intervention regardless. That said, we agreed best plan was for transfer to [facility] where line can be pulled at bedside and pressure held for 30 - 45 minutes.  Hopefully this will be all that's needed; however, if any further needs arise, vascular is aware and will take pt to OR for intervention.  Dr. Chase Caller has called pt's daughter to updated her on the above plan.  I have communicated with our team at [facility] and updated them on the plan as well. [facility] called and pt transfer order placed for [facility] ICU. RN updated with plan.   Montey Hora, [place] [facility] Medicine For pager details, please see AMION or use Epic chat  After 1900, please call [place] for cross coverage needs 06/27/2021, 10:41 AM

## 2021-06-27 NOTE — Progress Notes (Signed)
CPT not done pt sleep and resting well no distress  

## 2021-06-27 NOTE — Progress Notes (Signed)
PHARMACY NOTE:  ANTIMICROBIAL RENAL DOSAGE ADJUSTMENT  Current antimicrobial regimen includes a mismatch between antimicrobial dosage and estimated renal function.  As per policy approved by the Pharmacy & Therapeutics and Medical Executive Committees, the antimicrobial dosage will be adjusted accordingly.  Current antimicrobial dosage:  Unasyn 3 g q 12 hours  Indication: PNA/IAI  Renal Function:  Estimated Creatinine Clearance: 34 mL/min (A) (by C-G formula based on SCr of 1.13 mg/dL (H)). '[]'$      On intermittent HD, scheduled: '[]'$      On CRRT    Antimicrobial dosage has been changed to:  Unasyn 3 g q 8 hours  Additional comments:   Thank you for allowing pharmacy to be a part of this patient's care.  Napoleon Form, [facility] 06/27/2021 8:22 AM

## 2021-06-27 NOTE — Progress Notes (Signed)
NAME:  Mary Strickland, MRN:  [id number], DOB:  [birth year], LOS: 2 ADMISSION DATE:  06/24/2021, CONSULTATION DATE:  06/24/21 REFERRING MD:  ED Doc, CHIEF COMPLAINT:  hemoptysis   BRIEF  This 87 y.o. Caucasian female reformed smoker presented to the [facility] Emergency Department via EMS with complaints of acute onset hemoptysis.  She reported that she awoke from her sleep and experienced a bout of hemoptysis.  EMS noted coarse breath sounds.  She was hypertensive.  She denied any chest pain.  She was placed on supplemental oxygen and transported to [facility].  In the ER, she was initially observed to be hemodynamically stable but had a sudden bout of oxygen desaturation to the 80s and demonstrated altered mental status/confusion, prompting endotracheal intubation 06/24/2021  Has pneumomediastinum, pneumothorax Requiring pressors  Pertinent  Medical History   has a past medical history of Anxiety, Back pain, Blood loss anemia, Dyslipidemia, GERD (gastroesophageal reflux disease), History of diverticulitis of colon, Hypertension, Idiopathic scoliosis, Lumbosacral spondylosis, Mitral valve prolapse, Neuroma, Morton's, Palpitations, Psychosis ([place]), Right carotid bruit, and Ulcer.    has a past surgical history that includes Total knee arthroplasty (October 2002); Other surgical history; cataracts; Excision Morton's neuroma; Lumbar fusion; and Other surgical history.   Significant Hospital Events: Including procedures, antibiotic start and stop dates in addition to other pertinent events   06/24/2021 - admit 7/24 - CT scan with pneumomediastinum, pneumothorax, extensive soft tissue gas in the abdominal wall 7/25 central line placed, left chest tube placed -> extubated.  Pressors being weaned. Awake and interative  Interim History / Subjective:    7/26 - remains extubated. Needing 12 L and reduced to 6L and pulse ox 88%.  Concern today that the left IJ central line is  malpositioned in the artery/aorta.  Chest x-ray with significant infiltrates no further hemoptysis/hematemesis.  History retake she room was coughing up blood.  Currently no abdominal pain.  Registered nurse worried about inability to clear secretions with coughing and rattly breath sounds.  No dysphagia for liquids noted at bedside  Objective   Blood pressure (!) 143/56, pulse (!) 111, temperature 98.8 F (37.1 C), temperature source Oral, resp. rate (!) 27, height '5\' 2"'$  (1.575 m), weight 81.3 kg, SpO2 93 %.    Vent Mode: PSV;CPAP FiO2 (%):  [40 %] 40 % PEEP:  [5 cmH20] 5 cmH20 Pressure Support:  [5 cmH20] 5 cmH20   Intake/Output Summary (Last 24 hours) at 06/27/2021 0855 Last data filed at 06/27/2021 0800 Gross per 24 hour  Intake 318.67 ml  Output 804 ml  Net -485.33 ml   Filed Weights   06/25/21 0500 06/26/21 0500 06/27/21 0500  Weight: 75.2 kg 80.3 kg 81.3 kg    Examination: General Appearance:  Looks criticall ill OBESE - no Head:  Normocephalic, without obvious abnormality, atraumatic Eyes:  PERRL - yes, conjunctiva/corneas - muddy     Ears:  Normal external ear canals, both ears Nose:  G tube - no but has 10L [place] Throat:  ETT TUBE - no , OG tube - no Neck:  Supple,  No enlargement/tenderness/nodules. US neck - ? Left IJ in left carotid Lungs:  Off vent. No overt distress. Left sided chest tube +. Crackling breath sounds bilaterally esp UL Heart:  S1 and S2 normal, no murmur, CVP - no.  Pressors - no Abdomen:  Soft, no masses, no organomegaly Genitalia / Rectal:  Not done Extremities:  Extremities- intact Skin:  ntact in exposed areas . Sacral  area - not examind Neurologic:  Sedation - none -> RASS - +1 . Moves all 4s - yes. CAM-ICU - neg . Orientation - x3+      Resolved Hospital Problem list     Assessment & Plan:   Acute hypoxemic respiratory failure s/p intubation and mechanical ventilation - present on admit 0- in setting of bilateral pulmonary infiltrates  with left-sided pneumothorax and history of hemoptysis -S/p extubation 06/26/2021  06/27/2021: Hypoxemic respiratory failure requiring 10 L continues to persist.  Bilateral infiltrates continue to persist. -Clinical suspicion is infectious pneumonia versus vasculitis pneumothorax control the chest tube.  No further hemoptysis.  Concern is alveolar hemorrhage versus hematemesis.  Plan - Oxygen for pulse ox goal greater than 88% [currently needing 10 L] -Incentive spirometry -Assess for etiologies of pulmonary infiltrates -Check respiratory virus panel, procalcitonin, urine strep, urine Legionella  -Check QuantiFERON gold -Check autoimmune panel with vasculitis panel   Possible / Likely pneumonia -present as an admission.  Evidenced by bilateral infiltrates + fever 99.71F first 24h of admit and tmax 102.28F on  7/25 and high WBC  7/26 -no fever  Plan -Continue antibiotics -Tracheal aspirate pending -Check respiratory virus panel, urine strep, urine Legionella, procalcitonin   Hemoptysis V HEMATEMEMSI -at admit  -06/27/2021: No further recurrence of either of the above.  She gives a clear history of coughing up blood.  She has pulmonary infiltrates.  But she did have coffee-ground in the NG tube prior to extubation.  An endotracheal tube suctioning did not show any blood  Plan --On PPI twice daily -Continue to monitor -Might need GI consult especially if infectious and serology work-up is negative  Pneumomediastinum & Pneumothorax - AT ADMIT -Chest tube in place  06/27/2021: This could be as a result of all the primary respiratory process AT ADMIT. cXR with control of ptx  Plan  - continue chest tube  Concern for malpositioned Left IJ CVL - new problem 06/27/21 - concern for intra-arterial position  Plan  - Do ultrasound evaluation of the central line - Get ABG/VBG from the central line - Connect arterial line trace the left IJ central line -Any concern for the central line being  in the artery will probably require vascular service involvement   Hypertension baseline issue -Continue to hold home antihypertensive   Best Practice (right click and "Reselect all SmartList Selections" daily)   Diet/type: NPO + D5 LR maintenace. Can try clear lqiuqiud DVT prophylaxis: prophylactic heparin  GI prophylaxis: PPI Lines: N/A Foley:  N/A Code Status:  DNR Last date of multidisciplinary goals of care discussion   - [discussed with patient's daughter 06/25/2021] - patient updated 06/27/21 & daughter Jacqulyn Cane [id number] [place]   The patient Chatara Delduca is critically ill with multiple organ systems failure and requires high complexity decision making for assessment and support, frequent evaluation and titration of therapies, application of advanced monitoring technologies and extensive interpretation of multiple databases.   Critical Care Time devoted to patient care services described in this note is  45  Minutes. This time reflects time of care of this signee Dr Brand Males. This critical care time does not reflect procedure time, or teaching time or supervisory time of PA/NP/Med student/Med Resident etc but could involve care discussion time     Dr. Brand Males, M.D., [facility].C.P Pulmonary and Critical Care Medicine Staff Physician [place] [facility] Pager: [phone], If no answer or  between  15:00h - 7:00h: call [phone]  06/27/2021 8:55 AM   LABS    PULMONARY Recent Labs  Lab 06/24/21 2358 06/25/21 0500 06/25/21 0855 06/26/21 0545  PHART 7.060* 7.283* 7.312* 7.322*  PCO2ART 80.2* 45.5 42.1 40.5  PO2ART 221* 74.6* 85.5 90.4  HCO3 21.9 20.8 20.7 20.3  O2SAT 98.2 93.7 96.5 97.5    CBC Recent Labs  Lab 06/24/21 2230 06/25/21 0727 06/27/21 0754  HGB 13.7 12.3 10.0*  HCT 41.0 37.6 29.4*  WBC 22.2* 28.5* 19.7*  PLT 299 281 153    COAGULATION Recent Labs   Lab 06/24/21 2303  INR 0.9    CARDIAC  No results for input(s): TROPONINI in the last 168 hours. No results for input(s): PROBNP in the last 168 hours.   CHEMISTRY Recent Labs  Lab 06/24/21 2230 06/25/21 0727 06/26/21 0545 06/26/21 1030 06/27/21 0125 06/27/21 0754  NA 137 136 133* 137  --  138  K 3.7 4.4 5.8* 3.9  --  3.9  CL 103 105 105 106  --  108  CO2 23 20* 20* 21*  --  22  GLUCOSE 163* 132* 127* 96  --  86  BUN 23 23 30* 29*  --  28*  CREATININE 0.97 1.05* 1.47* 1.15* 1.13* 1.12*  CALCIUM 9.0 8.2* 8.0* 8.4*  --  8.3*  MG  --  1.8  --   --   --   --    Estimated Creatinine Clearance: 34.3 mL/min (A) (by C-G formula based on SCr of 1.12 mg/dL (H)).   LIVER Recent Labs  Lab 06/24/21 2230 06/24/21 2303  AST 27  --   ALT 21  --   ALKPHOS 41  --   BILITOT 0.5  --   PROT 7.9  --   ALBUMIN 4.5  --   INR  --  0.9     INFECTIOUS No results for input(s): LATICACIDVEN, PROCALCITON in the last 168 hours.   ENDOCRINE CBG (last 3)  No results for input(s): GLUCAP in the last 72 hours.       IMAGING x48h  - image(s) personally visualized  -   highlighted in bold CT ABDOMEN PELVIS WO CONTRAST  Result Date: 06/25/2021 CLINICAL DATA:  Peritonitis, soft tissue gas overlying the abdomen. Concern for bowel perforation. EXAM: CT ABDOMEN AND PELVIS WITHOUT CONTRAST TECHNIQUE: Multidetector CT imaging of the abdomen and pelvis was performed following the standard protocol without IV contrast. COMPARISON:  CT chest dated 06/25/2021 and abdominal radiograph dated 06/24/2021. FINDINGS: Lower chest: A moderate left pneumothorax is partially imaged. There is pneumomediastinum and soft tissue gas in the thorax which is partially imaged. Gas along the inferior surface of the right lung is favored to represent large blebs or soft tissue gas. Moderate consolidation and atelectasis are seen in the lung bases. Hepatobiliary: No focal liver abnormality is seen. No gallstones,  gallbladder wall thickening, or biliary dilatation. Pancreas: Unremarkable. No pancreatic ductal dilatation or surrounding inflammatory changes. Spleen: Normal in size without focal abnormality. Adrenals/Urinary Tract: Adrenal glands are unremarkable. Small amounts of intravenous contrast is seen in the urinary collecting system. Kidneys are normal, without renal calculi, focal lesion, or hydronephrosis. A Foley catheter is in place and the bladder is deflated. Stomach/Bowel: An enteric tube terminates in the stomach. Enteric contrast reaches the ileum. Stomach is within normal limits. A duodenal diverticulum is noted adjacent to the pancreatic head. Appendix appears normal. There is colonic diverticulosis without evidence of diverticulitis. No  evidence of bowel wall thickening, distention, or inflammatory changes. No evidence of bowel perforation. Vascular/Lymphatic: Aortic atherosclerosis. No enlarged abdominal or pelvic lymph nodes. Reproductive: Status post hysterectomy. No adnexal masses. Other: There is soft tissue gas in the abdominal wall musculature and in the subcutaneous fat of the anterior abdominal wall. There is small to moderate volume intraperitoneal air. This may be extension of soft tissue gas from the abdominal wall and thorax into the intraperitoneal cavity, perhaps via the periumbilical region. No free intraperitoneal fluid. There is a fat containing paraumbilical hernia. Musculoskeletal: Degenerative changes are seen in the spine. Fixation hardware is seen from L3-S1. Spinal cord stimulator leads are noted at T9. IMPRESSION: 1. Small to moderate volume intraperitoneal air. Given the lack of definite bowel pathology, this intraperitoneal gas may be extension of soft tissue gas from the abdominal wall and thorax into the intraperitoneal cavity. 2. Partially imaged moderate left pneumothorax, pneumomediastinum, and bilateral consolidation and atelectasis. Electronically Signed   By: Zerita Boers  M.D.   On: 06/25/2021 11:14   DG Chest Port 1 View  Result Date: 06/27/2021 CLINICAL DATA:  Respiratory failure. EXAM: PORTABLE CHEST 1 VIEW COMPARISON:  06/25/2021.  CT 06/25/2021. FINDINGS: Interim extubation and removal of NG tube. Left IJ line noted projected over the midportion of the heart. Intra-aortic location cannot be excluded and a PA and lateral chest x-ray suggested for further evaluation. Left chest tube in stable position. No left pneumothorax noted on today's exam. Persistent right base pneumothorax and or prominent bleb noted. Low lung volumes with progressive bibasilar atelectasis/infiltrates. Heart size stable. Pneumomediastinum best identified on prior CT. Mild left chest subcutaneous emphysema. Degenerative change thoracic spine. IMPRESSION: 1. Interim extubation removal of NG tube. Left IJ line noted projected over the midportion of the heart. Intra-aortic location cannot be excluded and PA and lateral chest x-ray suggested for further evaluation. 2. Left chest tube in stable position. No left pneumothorax noted on today's exam. Persistent right base pneumothorax and or prominent bleb noted. 3. Low lung volumes with progressive bibasilar atelectasis/infiltrates. 4. Heart size stable. Pneumomediastinum best identified by prior CT. Mild left chest wall subcutaneous emphysema. These results will be called to the ordering clinician or representative by the Radiologist Assistant, and communication documented in the PACS or [employer]. Electronically Signed   By: Marcello Moores  Register   On: 06/27/2021 08:25   DG Chest Port 1 View  Result Date: 06/26/2021 CLINICAL DATA:  Respiratory failure, pneumothorax EXAM: PORTABLE CHEST 1 VIEW COMPARISON:  06/25/2021 FINDINGS: Left chest tube remains present. No definite pneumothorax. Left IJ central line, enteric tube, and endotracheal tubes are also present. Persistent patchy opacities of the left greater than right lungs. Similar left chest wall  subcutaneous emphysema and pneumomediastinum. IMPRESSION: Left chest tube present with no definite pneumothorax. Persistent patchy left greater than right pulmonary opacities. Similar subcutaneous emphysema and pneumomediastinum. Electronically Signed   By: Macy Mis M.D.   On: 06/26/2021 08:01   DG CHEST PORT 1 VIEW  Result Date: 06/25/2021 CLINICAL DATA:  Chest tube placement. EXAM: PORTABLE CHEST 1 VIEW COMPARISON:  Same day. FINDINGS: Stable cardiomediastinal silhouette. Endotracheal and nasogastric tubes are in good position. Interval placement of left-sided chest tube. Pneumothorax is no longer visualized. Left basilar atelectasis is noted. Left internal jugular catheter is again noted, but its distal tip is not well visualized. IMPRESSION: Interval placement of left-sided chest tube. No definite pneumothorax is noted. Left internal jugular catheter is again noted, but its distal tip is not  well visualized on this radiograph. Electronically Signed   By: Marijo Conception M.D.   On: 06/25/2021 18:24   DG CHEST PORT 1 VIEW  Result Date: 06/25/2021 CLINICAL DATA:  Central line placement. EXAM: PORTABLE CHEST 1 VIEW COMPARISON:  June 25, 2021. FINDINGS: The heart size and mediastinal contours are within normal limits. Endotracheal and nasogastric tubes are unchanged in position. Interval placement of left internal jugular catheter with distal tip in expected position of the right atrium. Moderate size left basilar pneumothorax is noted which is increased compared to prior exam. Left lower lobe atelectasis or infiltrate is noted. Mild right basilar subsegmental atelectasis is noted. Pneumomediastinum is again noted. The visualized skeletal structures are unremarkable. IMPRESSION: Interval placement of left internal jugular catheter with distal tip in expected position of right atrium; withdrawal by 3-4 cm is recommended. Moderate left basilar pneumothorax is noted which is increased compared to prior  exam. These results will be called to the ordering clinician or representative by the Radiologist Assistant, and communication documented in the PACS or [employer]. Electronically Signed   By: Marijo Conception M.D.   On: 06/25/2021 16:49   ECHOCARDIOGRAM COMPLETE  Result Date: 06/26/2021    ECHOCARDIOGRAM REPORT   Patient Name:   [facility] Date of Exam: 06/26/2021 Medical Rec #:  [id number]           Height:       62.0 in Accession #:    [id number]          Weight:       177.0 lb Date of Birth:  1930/04/22           BSA:          1.815 m Patient Age:    27 years            BP:           136/56 mmHg Patient Gender: F                   HR:           122 bpm. Exam Location:  Inpatient Procedure: 2D Echo, Cardiac Doppler, Color Doppler and Intracardiac            Opacification Agent Indications:    Elevated Troponin  History:        Patient has no prior history of Echocardiogram examinations.                 Risk Factors:Hypertension, Dyslipidemia and Former Smoker.  Sonographer:    Vickie Epley RDCS Referring Phys: [id number] [place] Comments: Technically difficult study due to poor echo windows. Image acquisition challenging due to respiratory motion. IMPRESSIONS  1. Left ventricular ejection fraction, by estimation, is 60 to 65%. The left ventricle has normal function. Left ventricular endocardial border not optimally defined to evaluate regional wall motion. Indeterminate diastolic filling due to E-A fusion.  2. Right ventricular systolic function is normal. The right ventricular size is normal. Tricuspid regurgitation signal is inadequate for assessing PA pressure.  3. The mitral valve was not well visualized. No evidence of mitral valve regurgitation. No evidence of mitral stenosis. Moderate mitral annular calcification.  4. The aortic valve is tricuspid. Aortic valve regurgitation is not visualized. Mild aortic valve sclerosis is present, with no evidence of aortic valve  stenosis.  5. The inferior vena cava is normal in size with greater than 50% respiratory variability, suggesting right atrial pressure of  3 mmHg.  6. Technically difficult stutdy with poor acoustic windows. FINDINGS  Left Ventricle: Left ventricular ejection fraction, by estimation, is 60 to 65%. The left ventricle has normal function. Left ventricular endocardial border not optimally defined to evaluate regional wall motion. Definity contrast agent was given IV to delineate the left ventricular endocardial borders. The left ventricular internal cavity size was normal in size. There is no left ventricular hypertrophy. Indeterminate diastolic filling due to E-A fusion. Right Ventricle: The right ventricular size is normal. No increase in right ventricular wall thickness. Right ventricular systolic function is normal. Tricuspid regurgitation signal is inadequate for assessing PA pressure. Left Atrium: Left atrial size was normal in size. Right Atrium: Right atrial size was normal in size. Pericardium: There is no evidence of pericardial effusion. Mitral Valve: The mitral valve was not well visualized. There is mild thickening of the mitral valve leaflet(s). Moderate mitral annular calcification. No evidence of mitral valve regurgitation. No evidence of mitral valve stenosis. Tricuspid Valve: The tricuspid valve is normal in structure. Tricuspid valve regurgitation is trivial. Aortic Valve: The aortic valve is tricuspid. Aortic valve regurgitation is not visualized. Mild aortic valve sclerosis is present, with no evidence of aortic valve stenosis. Pulmonic Valve: The pulmonic valve was normal in structure. Pulmonic valve regurgitation is not visualized. Aorta: The aortic root is normal in size and structure. Venous: The inferior vena cava is normal in size with greater than 50% respiratory variability, suggesting right atrial pressure of 3 mmHg. IAS/Shunts: No atrial level shunt detected by color flow Doppler.  LEFT  VENTRICLE PLAX 2D LVOT diam:     2.10 cm LV SV:         76 LV SV Index:   42 LVOT Area:     3.46 cm  LV Volumes (MOD) LV vol d, MOD A4C: 54.3 ml LV vol s, MOD A4C: 20.7 ml LV SV MOD A4C:     54.3 ml RIGHT VENTRICLE TAPSE (M-mode): 1.9 cm LEFT ATRIUM           Index       RIGHT ATRIUM           Index LA Vol (A2C): 25.9 ml 14.27 ml/m RA Area:     15.30 cm LA Vol (A4C): 47.6 ml 26.22 ml/m RA Volume:   36.90 ml  20.33 ml/m  AORTIC VALVE LVOT Vmax:   131.00 cm/s LVOT Vmean:  104.000 cm/s LVOT VTI:    0.219 m  AORTA Ao Root diam: 3.30 cm  SHUNTS Systemic VTI:  0.22 m Systemic Diam: 2.10 cm Loralie Champagne MD Electronically signed by Loralie Champagne MD Signature Date/Time: 06/26/2021/5:20:16 PM    Final

## 2021-06-27 NOTE — Consult Note (Signed)
Patient name: Mary Strickland MRN: [id number] DOB: 1930/01/09 Sex: female  REASON FOR CONSULT:   Intra-arterial triple-lumen catheter and left IJ  HPI:   Mary Strickland is a pleasant 87 y.o. female who had a left internal jugular vein catheter placed 2 days ago.  Today she had some swelling lateral of the neck and upon further interrogation it was noted that the arterial line was in the arterial system.  This is a left IJ catheter.  After discussion with me we decided the patient should be transferred to [facility].  I thought it would be reasonable to remove the catheter and hold pressure for hemostasis.  If there are any problems she would be at [facility] where she can have vascular surgery.  The patient was transferred here and the catheter was removed and pressure held for 30 minutes by the critical care team.  On examination after there is no evidence of bleeding.  There is a hematoma lateral to the catheter site that was present prior to removing the catheter.  Current Facility-Administered Medications  Medication Dose Route Frequency Provider Last Rate Last Admin   (feeding supplement) PROSource Plus liquid 30 mL  30 mL Oral BID BM Ramaswamy, Murali, MD       0.9 %  sodium chloride infusion   Intravenous PRN Olalere, Adewale A, MD   Stopped at 06/27/21 0941   acetaminophen (TYLENOL) suppository 650 mg  650 mg Rectal Q4H PRN Olalere, Adewale A, MD   650 mg at 06/26/21 1401   Ampicillin-Sulbactam (UNASYN) 3 g in sodium chloride 0.9 % 100 mL IVPB  3 g Intravenous Q8H Olalere, Adewale A, MD   Stopped at 06/27/21 1512   Chlorhexidine Gluconate Cloth 2 % PADS 6 each  6 each Topical Daily Renee Pain, MD   6 each at 06/27/21 0524   dextrose 5 % in lactated ringers infusion   Intravenous Continuous Brand Males, MD 50 mL/hr at 06/27/21 1103 New Bag at 06/27/21 1103   feeding supplement (BOOST / RESOURCE BREEZE) liquid 1 Container  1 Container Oral BID BM Brand Males, MD       hydrALAZINE (APRESOLINE) injection 10-40 mg  10-40 mg Intravenous Q4H PRN Erick Colace, NP       ipratropium-albuterol (DUONEB) 0.5-2.5 (3) MG/3ML nebulizer solution 3 mL  3 mL Nebulization Q6H Renee Pain, MD   3 mL at 06/27/21 0715   MEDLINE mouth rinse  15 mL Mouth Rinse BID Olalere, Adewale A, MD   15 mL at 06/27/21 1100   multivitamin with minerals tablet 1 tablet  1 tablet Oral Daily Brand Males, MD   1 tablet at 06/27/21 1516   ondansetron (ZOFRAN) injection 4 mg  4 mg Intravenous Q6H PRN Renee Pain, MD   4 mg at 06/27/21 1208   pantoprazole (PROTONIX) injection 40 mg  40 mg Intravenous Q12H Olalere, Adewale A, MD   40 mg at 06/27/21 1028    REVIEW OF SYSTEMS:  '[X]'$  denotes positive finding, '[ ]'$  denotes negative finding Vascular    Leg swelling    Cardiac    Chest pain or chest pressure:    Shortness of breath upon exertion:    Short of breath when lying flat:    Irregular heart rhythm:    Constitutional    Fever or chills:     PHYSICAL EXAM:   Vitals:   06/27/21 1130 06/27/21 1200 06/27/21 1300 06/27/21 1500  BP:  (!) 173/97 (!) 167/98 Marland Kitchen)  173/93  Pulse:  (!) 104 98 100  Resp:  (!) 26 (!) 25 (!) 23  Temp: 98.4 F (36.9 C)   98.6 F (37 C)  TempSrc: Oral   Oral  SpO2:  97% 97% 94%  Weight:      Height:        GENERAL: The patient is a well-nourished female, in no acute distress. The vital signs are documented above. CARDIOVASCULAR: There is a regular rate and rhythm. PULMONARY: There is good air exchange bilaterally without wheezing or rales. NECK: There is a hematoma lateral to the catheter site which was present prior to removing the catheter.  I do not detect a bruit.  The mass is not pulsatile.  DATA:   Ultrasound earlier today had suggested that the catheter was in the left carotid artery.  MEDICAL ISSUES:   INTRA-ARTERIAL TRIPLE-LUMEN CATHETER: The catheter was removed and pressure held for hemostasis with no immediate  complications noted.  There was some hematoma lateral to the catheter site which was present prior to removal of the catheter.  She is neurologically intact.  I would simply recommend a follow-up duplex scan tomorrow.  Deitra Mayo [facility] [phone]

## 2021-06-28 ENCOUNTER — Inpatient Hospital Stay (HOSPITAL_COMMUNITY): Payer: Medicare HMO

## 2021-06-28 DIAGNOSIS — J9601 Acute respiratory failure with hypoxia: Secondary | ICD-10-CM | POA: Diagnosis not present

## 2021-06-28 LAB — COMPREHENSIVE METABOLIC PANEL
ALT: 29 U/L (ref 0–44)
AST: 64 U/L — ABNORMAL HIGH (ref 15–41)
Albumin: 2.6 g/dL — ABNORMAL LOW (ref 3.5–5.0)
Alkaline Phosphatase: 31 U/L — ABNORMAL LOW (ref 38–126)
Anion gap: 11 (ref 5–15)
BUN: 31 mg/dL — ABNORMAL HIGH (ref 8–23)
CO2: 22 mmol/L (ref 22–32)
Calcium: 8.4 mg/dL — ABNORMAL LOW (ref 8.9–10.3)
Chloride: 103 mmol/L (ref 98–111)
Creatinine, Ser: 1.07 mg/dL — ABNORMAL HIGH (ref 0.44–1.00)
GFR, Estimated: 50 mL/min — ABNORMAL LOW (ref >60)
Glucose, Bld: 154 mg/dL — ABNORMAL HIGH (ref 70–99)
Potassium: 3.9 mmol/L (ref 3.5–5.1)
Sodium: 136 mmol/L (ref 135–145)
Total Bilirubin: 1 mg/dL (ref 0.3–1.2)
Total Protein: 5.8 g/dL — ABNORMAL LOW (ref 6.5–8.1)

## 2021-06-28 LAB — ANTI-SCLERODERMA ANTIBODY: Scleroderma (Scl-70) (ENA) Antibody, IgG: <0.2 AI (ref 0.0–0.9)

## 2021-06-28 LAB — GLOMERULAR BASEMENT MEMBRANE ANTIBODIES: GBM Ab: <0.2 units (ref 0.0–0.9)

## 2021-06-28 LAB — CBC
HCT: 26.1 % — ABNORMAL LOW (ref 36.0–46.0)
Hemoglobin: 9.2 g/dL — ABNORMAL LOW (ref 12.0–15.0)
MCH: 31.6 pg (ref 26.0–34.0)
MCHC: 35.2 g/dL (ref 30.0–36.0)
MCV: 89.7 fL (ref 80.0–100.0)
Platelets: 168 10*3/uL (ref 150–400)
RBC: 2.91 MIL/uL — ABNORMAL LOW (ref 3.87–5.11)
RDW: 14.4 % (ref 11.5–15.5)
WBC: 15.8 10*3/uL — ABNORMAL HIGH (ref 4.0–10.5)
nRBC: 0 % (ref 0.0–0.2)

## 2021-06-28 LAB — GLUCOSE, CAPILLARY
Glucose-Capillary: 112 mg/dL — ABNORMAL HIGH (ref 70–99)
Glucose-Capillary: 125 mg/dL — ABNORMAL HIGH (ref 70–99)
Glucose-Capillary: 131 mg/dL — ABNORMAL HIGH (ref 70–99)
Glucose-Capillary: 148 mg/dL — ABNORMAL HIGH (ref 70–99)

## 2021-06-28 LAB — ANTI-DNA ANTIBODY, DOUBLE-STRANDED: ds DNA Ab: 1 IU/mL (ref 0–9)

## 2021-06-28 LAB — MAGNESIUM: Magnesium: 2.1 mg/dL (ref 1.7–2.4)

## 2021-06-28 LAB — RHEUMATOID FACTOR: Rheumatoid fact SerPl-aCnc: 21.6 IU/mL — ABNORMAL HIGH (ref <14.0)

## 2021-06-28 LAB — LEGIONELLA PNEUMOPHILA SEROGP 1 UR AG: L. pneumophila Serogp 1 Ur Ag: NEGATIVE

## 2021-06-28 LAB — PROCALCITONIN: Procalcitonin: 1.35 ng/mL

## 2021-06-28 LAB — PHOSPHORUS: Phosphorus: 2.6 mg/dL (ref 2.5–4.6)

## 2021-06-28 MED ORDER — IPRATROPIUM-ALBUTEROL 0.5-2.5 (3) MG/3ML IN SOLN: 3.0000 mL | Freq: Four times a day (QID) | RESPIRATORY_TRACT | Status: DC | PRN

## 2021-06-28 MED ORDER — ENSURE ENLIVE PO LIQD
237.0000 mL | Freq: Two times a day (BID) | ORAL | Status: DC
  Administered 2021-06-28 – 2021-07-04 (×9): 237 mL via ORAL

## 2021-06-28 NOTE — Progress Notes (Signed)
   VASCULAR SURGERY ASSESSMENT & PLAN:   S/P REMOVAL OF INTRA-ARTERIAL TRIPLE-LUMEN CATHETER: Moderate swelling in the lateral left neck is unchanged.  CT angiogram of the neck has not been read yet however I have reviewed the images and I do not see any evidence of pseudoaneurysm or active bleeding.  Some of the swelling may be related to her pneumopericardium.  On her previous CT scan of the chest it was noted that the pneumopericardium extended to the base of the neck.  Her exam is unchanged.  Vascular surgery will be available as needed.   SUBJECTIVE:   No complaints this morning.  PHYSICAL EXAM:   Vitals:   06/28/21 0300 06/28/21 0400 06/28/21 0500 06/28/21 0600  BP: (!) 109/50 (!) 152/58 (!) 113/55 (!) 129/56  Pulse: 92 94 89 94  Resp: 15 (!) '24 16 16  '$ Temp: 97.88 F (36.6 C) 97.88 F (36.6 C) 98.06 F (36.7 C)   TempSrc:  Bladder    SpO2: 96% 96% 97% 93%  Weight:   77.5 kg   Height:       Moderate swelling in the lateral left neck is unchanged.  LABS:   Lab Results  Component Value Date   WBC 15.8 (H) 06/28/2021   HGB 9.2 (L) 06/28/2021   HCT 26.1 (L) 06/28/2021   MCV 89.7 06/28/2021   PLT 168 06/28/2021   Lab Results  Component Value Date   CREATININE 1.07 (H) 06/28/2021   Lab Results  Component Value Date   INR 0.9 06/24/2021   CBG (last 3)  Recent Labs    06/27/21 1818 06/27/21 1935 06/28/21 0005  GLUCAP 193* 175* 131*    PROBLEM LIST:    Principal Problem:   Acute hypoxemic respiratory failure (HCC) Active Problems:   Hypertension   Hemoptysis   Abnormal CXR   Leukocytosis   Subcutaneous emphysema (HCC)   Pneumoperitoneum of unknown etiology   Pneumothorax on right   CURRENT MEDS:    (feeding supplement) PROSource Plus  30 mL Oral BID BM   Chlorhexidine Gluconate Cloth  6 each Topical Daily   feeding supplement  1 Container Oral BID BM   ipratropium-albuterol  3 mL Nebulization Q6H   mouth rinse  15 mL Mouth Rinse BID    multivitamin with minerals  1 tablet Oral Daily   pantoprazole (PROTONIX) IV  40 mg Intravenous Q12H    Deitra Mayo Office: [phone] 06/28/2021

## 2021-06-28 NOTE — Progress Notes (Addendum)
NAME:  Mary Strickland, MRN:  [id number], DOB:  1930-11-09, LOS: 3 ADMISSION DATE:  06/24/2021, CONSULTATION DATE:  06/24/21 REFERRING MD:  ED Doc, CHIEF COMPLAINT:  hemoptysis   BRIEF  This 87 y.o. Caucasian female prior smoker presented with complaints of acute onset hemoptysis.   In the ER, she was initially observed to be hemodynamically stable with no acute finding seen on CXR but while in the ED she  had a sudden bout of oxygen desaturation to the 80s and demonstrated altered mental status/confusion, prompting endotracheal intubation 06/24/2021, CTA obtained post intubation and revealed pneumomediastinum and  left pneumothorax  Pertinent  Medical History  HTN HLD GERD Anxiety Anemia  Mitral valve prolapse   Significant Hospital Events:  06/24/2021 - admitted at 2251 7/24 - CT scan with pneumomediastinum, pneumothorax, extensive soft tissue gas in the abdominal wall 7/25 central line placed, left chest tube placed -> extubated.  Pressors being weaned. Awake and interactive  7/27 No acute events overnight  Interim History / Subjective:  No acute issues overnight  No reports of hemoptysis   Objective   Blood pressure 115/65, pulse 88, temperature 98 F (36.7 C), temperature source Axillary, resp. rate 16, height '5\' 2"'$  (1.575 m), weight 77.5 kg, SpO2 97 %.    FiO2 (%):  [40 %] 40 %   Intake/Output Summary (Last 24 hours) at 06/28/2021 0849 Last data filed at 06/28/2021 0700 Gross per 24 hour  Intake 1412.61 ml  Output 1402 ml  Net 10.61 ml    Filed Weights   06/26/21 0500 06/27/21 0500 06/28/21 0500  Weight: 80.3 kg 81.3 kg 77.5 kg    Examination: General: Acute on chronic ill appearing elderly female lying in bed, in NAD HEENT: [place]/AT, MM pink/moist, PERRL,  Neuro: Alert and oriented x3, non-focal  CV: s1s2 regular rate and rhythm, no murmur, rubs, or gallops,  PULM:  Extensive bilaterally rhonchi this improved slightly with prompted cough but unable to expel sputum   GI: soft, bowel sounds active in all 4 quadrants, non-tender, non-distended Extremities: warm/dry, no edema  Skin: no rashes or lesions  Resolved Hospital Problem list     Assessment & Plan:   Acute hypoxemic respiratory failure in setting of CAP with bilateral pulmonary infiltrates  -Required intubation on arrival, extubated 7/26 -Strep PNA negative, Legionella pending  Hemoptysis  -Concern is alveolar hemorrhage versus hematemesis. No further episode of hemoptysis since admission  Left pneumothorax with pneumomediastinum  -chest tube placed 7/26 P: Supplemental oxygen as needed for oxygen sats greater than 92 Head of bed elevated 30 degrees Follow intermittent chest x-ray and ABG Follow cultures  Routine chest tube care Mobilize as abel  RVP negative  Continue empiric Unasyn  Remains on PPI BID Q6hr chest PT Encourage frequent and adequate use of both flutter and IS  Obtain sputum sample is abel to produce sputum   Malpositioned Left IJ CVL  -Intra-arterial position, CVL removed 7/26 -CTA neck 7/27 ; No acute or focal vascular injury or pseudoaneurysm. P: Vascular seen during admission, have no signed off   Hypertension baseline issue -Home medications include Valsartan and Norvasc  P: Follow for ability to resume home medications   Transaminitis -LFT slightly elevated this am, possibly related to hypovolemia as patient had short need of pressors post intubation P:  Avoid hepatotoxins Supportive care  Intermittently trend LFTs   Anemia  -Hgb stable at 9.2 P:  Best Practice    Diet/type: Regular consistency (see orders) DVT prophylaxis: prophylactic heparin  GI prophylaxis: PPI Lines: N/A Foley:  N/A Code Status:  DNR Last date of multidisciplinary goals of care discussion  Update family daily   Critical care: NA   Whitney D. Kenton Kingfisher, NP-C [facility] Personal contact information can be found on Amion  06/28/2021, 9:12 AM  I  have taken an interval history, reviewed the chart and examined the patient. I agree with the Advanced Practitioner's note, impression, and recommendations as outlined.   Briefly, 87 year old female who presented with hemoptysis and requiring intubation on 7/23 for acute hypoxemic respiratory failure and AMS. CTA demonstrated extensive pneumomediastinum and left pneumothorax. Chest tube placed and central line when she decompensated. She was extubated on 7/25. Yesterday, she was transferred to [facility] for malpositioned central line. After transfer, team consulted Vascular and line was pulled at bedside with pressure post-removal. She has remained hemodynamically stable. Denies any further episodes of hemoptysis.  Blood pressure (!) 115/57, pulse 89, temperature 97.7 F (36.5 C), temperature source Oral, resp. rate 15, height '5\' 2"'$  (1.575 m), weight 77.5 kg, SpO2 96 %. On exam, elderly female on nasal cannula. Lungs bilateral rhonchi, no wheezing. Heart RRR, no murmur. Extremities no edema, warm to touch. Left chest tube in place.  Chest imaging since admission on 7/23 reviewed. Extensive pneumomediastinum and left pneumothorax on 7/24 CTA.  CXR 7/27 interval removal of left central line. RML infiltrate and right basilar pneumothorax/bleb. Chest wall subcutaneous emphysema  Labs reviewed. Improving leukocytosis  Assessment  Pneumomediastinum Left pneumothorax s/p chest tube 7/25 Emphysema Malpositioned CVC - resolved  --Oxygenation improving. Continue to wean --Transition chest tube to water seal  Transfer to [facility]. Pulmonary will continue to follow for chest tube management.  Care Time: 35 minutes  Rodman Pickle, M.D. [facility] Pulmonary/Critical Care Medicine 06/28/2021 2:09 PM   Please see Amion for pager number to reach on-call Pulmonary and Critical Care Team.

## 2021-06-28 NOTE — Progress Notes (Signed)
Nutrition Follow-up  DOCUMENTATION CODES:   Not applicable  INTERVENTION:   - Ensure Enlive po BID, each supplement provides 350 kcal and 20 grams of protein  - Downgrade diet to Dysphagia 3 for ease of chewing  - Continue MVI with minerals daily  - Encourage PO intake  - d/c Boost Breeze and ProSource Plus  NUTRITION DIAGNOSIS:   Inadequate oral intake related to inability to eat as evidenced by NPO status.  Progressing, pt now on dysphagia 3 diet  GOAL:   Patient will meet greater than or equal to 90% of their needs  Progressing  MONITOR:   PO intake, Supplement acceptance, Labs, Weight trends, I & O's  REASON FOR ASSESSMENT:   Ventilator    ASSESSMENT:   87 y.o. female who is a former smoker and has medical history of HTN, dyslipidemia, GERD, Morton's neuroma, idiopathic scoliosis, mitral valve prolapse, anxiety, psychosis, and diverticulitis. She presented to the ED via EMS d/t acute onset of hemoptysis. In the ED she was initially hemodynamically stable but then experienced sudden episode of O2 desaturation into the 80s and became altered with confusion. She was subsequently intubated in the ED.  7/25 - extubated 7/26 - chest tube placed for L pneumothorax, clear liquids, transferred to [facility] 7/27 - diet advanced to Heart Healthy  Pt admitted with acute hypoxemic respiratory failure in setting of CAP with bilateral pulmonary infiltrates and hemoptysis. Pt with no further episodes of hemoptysis since admission.  Discussed pt with RN and during ICU rounds. Discussed diet liberalization with CCM who agreed. Verbal with readback order placed for Regular diet. Later spoke with pt who requested food come already chopped up. Diet downgraded to dysphagia 3.  Spoke with pt at bedside who reports that she had "some sort of soup" for breakfast (pt was on clear liquid diet). Pt concerned that she can't chew food well but is willing to try regular textures. Pt drank 100% of a  Boost Breeze this morning. She is willing to try Ensure to provide additional kcal and protein.  Admit weight: 73.5 kg Current weight: 77.5 kg  Meal Completion: 10% x 1 documented meal  Medications reviewed and include: ProSource Plus BID, Boost Breeze BID, MVI with minerals daily, IV protonix, IV abx  Labs reviewed: BUN 31, creatinine 1.07, hemoglobin 9.2 CBG's: 125-193 x 24 hours  UOP: 1420 ml x 24 hours CT: 32 ml x 24 hours I/O's: +1.4 L since admit  Diet Order:   Diet Order             DIET DYS 3 Room service appropriate? Yes; Fluid consistency: Thin  Diet effective now                   EDUCATION NEEDS:   Education needs have been addressed  Skin:  Skin Assessment: Reviewed RN Assessment  Last BM:  no documented BM  Height:   Ht Readings from Last 1 Encounters:  06/25/21 '5\' 2"'$  (1.575 m)    Weight:   Wt Readings from Last 1 Encounters:  06/28/21 77.5 kg    Ideal Body Weight:  50 kg  BMI:  Body mass index is 31.25 kg/m.  Estimated Nutritional Needs:   Kcal:  1400-1600  Protein:  70-85 grams  Fluid:  >/= 1.5 L/day    Gustavus Bryant, MS, RD, LDN Inpatient Clinical Dietitian Please see AMiON for contact information.

## 2021-06-29 ENCOUNTER — Inpatient Hospital Stay (HOSPITAL_COMMUNITY): Payer: Medicare HMO

## 2021-06-29 ENCOUNTER — Other Ambulatory Visit (HOSPITAL_COMMUNITY): Payer: Medicare HMO

## 2021-06-29 DIAGNOSIS — J9601 Acute respiratory failure with hypoxia: Secondary | ICD-10-CM | POA: Diagnosis not present

## 2021-06-29 LAB — CBC
HCT: 26.3 % — ABNORMAL LOW (ref 36.0–46.0)
Hemoglobin: 9.2 g/dL — ABNORMAL LOW (ref 12.0–15.0)
MCH: 31.1 pg (ref 26.0–34.0)
MCHC: 35 g/dL (ref 30.0–36.0)
MCV: 88.9 fL (ref 80.0–100.0)
Platelets: 178 10*3/uL (ref 150–400)
RBC: 2.96 MIL/uL — ABNORMAL LOW (ref 3.87–5.11)
RDW: 14.2 % (ref 11.5–15.5)
WBC: 14.8 10*3/uL — ABNORMAL HIGH (ref 4.0–10.5)
nRBC: 0 % (ref 0.0–0.2)

## 2021-06-29 LAB — PHOSPHORUS: Phosphorus: 2.1 mg/dL — ABNORMAL LOW (ref 2.5–4.6)

## 2021-06-29 LAB — TROPONIN I (HIGH SENSITIVITY)
Troponin I (High Sensitivity): 2626 ng/L (ref <18)
Troponin I (High Sensitivity): 1909 ng/L (ref <18)

## 2021-06-29 LAB — SJOGRENS SYNDROME-A EXTRACTABLE NUCLEAR ANTIBODY: SSA (Ro) (ENA) Antibody, IgG: <0.2 AI (ref 0.0–0.9)

## 2021-06-29 LAB — BASIC METABOLIC PANEL
Anion gap: 7 (ref 5–15)
BUN: 33 mg/dL — ABNORMAL HIGH (ref 8–23)
CO2: 23 mmol/L (ref 22–32)
Calcium: 8.6 mg/dL — ABNORMAL LOW (ref 8.9–10.3)
Chloride: 102 mmol/L (ref 98–111)
Creatinine, Ser: 0.9 mg/dL (ref 0.44–1.00)
GFR, Estimated: >60 mL/min (ref >60)
Glucose, Bld: 113 mg/dL — ABNORMAL HIGH (ref 70–99)
Potassium: 3.7 mmol/L (ref 3.5–5.1)
Sodium: 132 mmol/L — ABNORMAL LOW (ref 135–145)

## 2021-06-29 LAB — ANCA TITERS
Atypical P-ANCA titer: <1:20 {titer}
C-ANCA: <1:20 {titer}
P-ANCA: <1:20 {titer}

## 2021-06-29 LAB — CYCLIC CITRUL PEPTIDE ANTIBODY, IGG/IGA: CCP Antibodies IgG/IgA: 7 units (ref 0–19)

## 2021-06-29 LAB — SJOGRENS SYNDROME-B EXTRACTABLE NUCLEAR ANTIBODY: SSB (La) (ENA) Antibody, IgG: <0.2 AI (ref 0.0–0.9)

## 2021-06-29 LAB — PROCALCITONIN: Procalcitonin: 0.95 ng/mL

## 2021-06-29 LAB — GLUCOSE, CAPILLARY: Glucose-Capillary: 106 mg/dL — ABNORMAL HIGH (ref 70–99)

## 2021-06-29 MED ORDER — PERFLUTREN LIPID MICROSPHERE
INTRAVENOUS | Status: AC
  Filled 2021-06-29: qty 10

## 2021-06-29 MED ORDER — SODIUM PHOSPHATES 45 MMOLE/15ML IV SOLN
15.0000 mmol | Freq: Once | INTRAVENOUS | Status: AC
  Administered 2021-06-29: 15 mmol via INTRAVENOUS
  Filled 2021-06-29: qty 5

## 2021-06-29 MED ORDER — METOPROLOL TARTRATE 12.5 MG HALF TABLET
12.5000 mg | ORAL_TABLET | Freq: Two times a day (BID) | ORAL | Status: DC
  Administered 2021-06-29 – 2021-08-03 (×70): 12.5 mg via ORAL
  Filled 2021-06-29 (×71): qty 1

## 2021-06-29 MED ORDER — TRANEXAMIC ACID FOR INHALATION
500.0000 mg | Freq: Once | RESPIRATORY_TRACT | Status: AC
  Administered 2021-06-29: 500 mg via RESPIRATORY_TRACT
  Filled 2021-06-29 (×3): qty 5

## 2021-06-29 MED ORDER — GUAIFENESIN ER 600 MG PO TB12
1200.0000 mg | ORAL_TABLET | Freq: Two times a day (BID) | ORAL | Status: DC
  Administered 2021-06-29 – 2021-08-03 (×71): 1200 mg via ORAL
  Filled 2021-06-29 (×71): qty 2

## 2021-06-29 NOTE — Progress Notes (Signed)
PT Cancellation Note  Patient Details Name: Mary Strickland MRN: [id number] DOB: 05-Apr-1930   Cancelled Treatment:    Reason Eval/Treat Not Completed: Medical issues which prohibited therapy. Pt with elevated troponins. Checked with MD and asked [name] to hold today. Will follow up tomorrow.    [place] 06/29/2021, 1:26 PM [place] Pager [phone] Office [phone]

## 2021-06-29 NOTE — Progress Notes (Addendum)
NAME:  Mary Strickland, MRN:  [id number], DOB:  [birth year], LOS: 4 ADMISSION DATE:  06/24/2021, CONSULTATION DATE:  06/24/21 REFERRING MD:  ED Doc, CHIEF COMPLAINT:  hemoptysis   BRIEF  This 87 y.o. Caucasian female prior smoker presented with complaints of acute onset hemoptysis.   In the ER, she was initially observed to be hemodynamically stable with no acute finding seen on CXR but while in the ED she  had a sudden bout of oxygen desaturation to the 80s and demonstrated altered mental status/confusion, prompting endotracheal intubation 06/24/2021, CTA obtained post intubation and revealed pneumomediastinum and left pneumothorax  Pertinent  Medical History  HTN HLD GERD Anxiety Anemia  Mitral valve prolapse   Significant Hospital Events:  06/24/2021 - admitted at 2251 7/24 - CT scan with pneumomediastinum, pneumothorax, extensive soft tissue gas in the abdominal wall 7/25 central line placed, left chest tube placed -> extubated.  Pressors being weaned. Awake and interactive  7/27 No acute events overnight 7/28 Active hemoptysis  Interim History / Subjective:  Moved out of the ICU, transferred to hospitalist service.  Troponin elevated. Cardiology consulted by hospitalist.   O2 requirements increasing overnight  Subjective. Endorses SOB/Congestion. Endorses hemoptysis. Productive cough. Denies chest pain. Denies N&V. Denies hematemesis. In the AM per nursing, patient called 911 due to concern of being SOB.  Objective   Blood pressure (!) 168/89, pulse (!) 119, temperature 97.9 F (36.6 C), temperature source Oral, resp. rate (!) 24, height '5\' 2"'$  (1.575 m), weight 79.8 kg, SpO2 95 %. On 9L [place].       No intake or output data in the 24 hours ending 06/29/21 1404  Filed Weights   06/27/21 0500 06/28/21 0500 06/29/21 0521  Weight: 81.3 kg 77.5 kg 79.8 kg    Examination: General: in bed, frail, ill appearing  HEENT: MM pink/moist, anicteric, atraumatic Neuro: GCS 15, RASS  0, PERRL 63m CV: S1S2, ST with frequent PAC, no m/r/g appreciated PULM:  rhonci in the upper lobes and in the lower lobes, Trachea midline, chest expansion symmetric, hemoptysis. Left chest tube in place. GI: soft, bsx4 active, non distended  Extremities: warm/dry, no pre tibial edema, capillary refill less than 3 seconds  Skin: no rashes or lesions   Resolved Hospital Problem list     Assessment & Plan:   Acute hypoxemic respiratory failure in setting of CAP with bilateral pulmonary infiltrates  Hemoptysis- Active Emphysema Malpositioned CVC Left pneumothorax with pneumomediastinum  chest tube placed 7/26, water sealed on 7/27, CXR on 7/28 demonstrates no pneumo on film. RML infiltrate. Leukocytosis downtrending. Afebrile. +1.4 L admission. O2 requirement increasing from 6L to 8L. Hemoptysis in sputum expectorated in cup at bedside. Suspect increasing O2 requirement secondary to bleeding. Endorses this blood overnight.   P: -Dr. ELoanne Drillingspoke with CCand[facility] [place] on phone to explain situation with active hemoptysis. Family would not want her intubated for procedure. Decision by family  to proceed with comfort measures (see Dr. ECordelia Pennote). Dr. RTyrell Antonionotified via phone and plans on consulting palliative for assistance with symptom management.  -Nebulized TXA now.  -PRN suction as needed -Continue supplemental oxygen as needed. Goal O2 sats 92-98%. Adjust to goal. -Continue pulmonary hygiene with IS, coughing, q6h chest pt, and mobilization as able -Continue empiric PPI -Continue Unasyn. -Continue guaifenesin  -Follow sputum cultures. -Follow up on cardiology recommendations. -Chest tube removed today. Will obtain post chest tube removal CXR two hours post pull. -Continue to monitor CVC site. -Continue SCD. Not a  candidate for anticoagulation due to active bleeding. Recommend stopping heparin for DVT PPX.  -Tailor interventions to patient  comfort.  HTN Transaminases Anemia Hyponatremia Hypophosphatemia -All other issues per primary  Best Practice    Diet/type: Regular consistency (see orders) DVT prophylaxis: SCD GI prophylaxis: PPI Lines: N/A Foley:  N/A Code Status:  DNR Last date of multidisciplinary goals of care discussion: Today, see Dr. Rosario Adie note   Critical care: NA   Redmond School., MSN, APRN, AGACNP-BC [facility]  06/29/2021 , 3:35 PM  Please see [email or web address] for pager details  If no response, please call [phone] After hours, please call Elink at [phone]

## 2021-06-29 NOTE — Progress Notes (Signed)
PROGRESS NOTE    Mary Strickland  [id number] DOB: [birth year] DOA: 06/24/2021 PCP: Burnard Bunting, MD   Brief Narrative: 87 year old prior history of smoking, hypertension, mitral valve prolapse who was admitted with acute onset hemoptysis.  In the ED, she was initially observed to be hemodynamically stable with no acute findings seen on chest x-ray.  While in the ED she had a sudden drop of oxygen saturation to the 80s and demonstrated altered mental status/confusion, prompting endotracheal intubation on 06/24/2021, CTA obtained post intubation revealed pneumomediastinum and left pneumothorax.  Patient was admitted by CCM, she had a left-sided chest tube placed on 7/25.  She was subsequently extubated on 7/25th.  She has been off of IV pressors.  CTA chest was negative for PE, showed extensive pneumomediastinum throughout the chest extending into the base of the neck, question a degree of pneumopericardium as well.  Patient care was transferred to [facility] on 7/28.  CCM will continue to follow for chest tube management   Assessment & Plan:   Principal Problem:   Acute hypoxemic respiratory failure (HCC) Active Problems:   Hypertension   Hemoptysis   Abnormal CXR   Leukocytosis   Subcutaneous emphysema (HCC)   Pneumoperitoneum of unknown etiology   Pneumothorax on right  1-Acute Respiratory Failure in the setting of CAP and bilateral pulmonary infiltrates, left pneumothorax with pneumomediastinum: -Hemoptysis initial concern was for alveolar hemorrhage versus hematemesis.  No further episode of hemoptysis since admission. -Patient was intubated on arrival 7/23 s/p extubation on 7/25. -Patient had left side chest tube placement 7/25. -Chest x-ray 7/28 two-point stable position -Continue with Unasyn. -Flutter valve and will start Mucinex -CCM following -Follow WBC trend.   2-Hypophosphatemia: Replete IV  3-Elevation of troponin: Troponin continued to increase.  Patient denies  chest pain.  Plan to repeat EKG.  Plan to repeat limited echo.  Cardiology has been consulted. ECHO 7/25: Fraction 60 to 65%.  Left ventricular function is normal.  Right ventricular function is normal.  4-malpositioned left IJ CVL: CTA neck 7/27 no acute or focal vascular injury or pseudoaneurysmal Vascular was initially consulted and they removed line.   5-Hypertension: Continue to hold Diovan and Norvasc, home medication. Continue with as needed hydralazine.  Transaminases: Possible related to hypovolemia.  Follow trend  Anemia: Monitor hemoglobin Mild Hyponatremia; monitor.  Dizziness;  Check CBG and BP.  Monitor.   Nutrition Problem: Inadequate oral intake Etiology: inability to eat    Signs/Symptoms: NPO status    Interventions: MVI, Ensure Enlive (each supplement provides 350kcal and 20 grams of protein), Other (Comment) (downgrade diet)  Estimated body mass index is 32.18 kg/m as calculated from the following:   Height as of this encounter: '5\' 2"'$  (1.575 m).   Weight as of this encounter: 79.8 kg.   DVT prophylaxis: He is no anticoagulation due to initial presentation of hemoptysis Code Status: DNR Family Communication: No family at bedside Disposition Plan:  Status is: Inpatient  Remains inpatient appropriate because:Hemodynamically unstable  Dispo: The patient is from: Home              Anticipated d/c is to:  Be determined              Patient currently is not medically stable to d/c.   Difficult to place patient No        Consultants:  CCM admitted patient Cardiology Vascular surgery  Procedures:  Echo: Normal ejection fraction  Antimicrobials:  Unasyn  Subjective: Patient seen this morning, she is  alert and conversant.  She report chest congestion.  Unable to cough up sputum. Staff plan to get patient a flutter valve.  I have started guaifenesin. Report mild dizziness.  Blood sugar and blood pressure wasok.   Objective: Vitals:    06/28/21 1841 06/28/21 1957 06/29/21 0100 06/29/21 0521  BP: (!) 157/74 (!) 150/76 127/69 137/84  Pulse: (!) 107 (!) 103 94 92  Resp: '20 20 16 16  '$ Temp: (!) 97.5 F (36.4 C) 98.1 F (36.7 C) 98.1 F (36.7 C) 98.3 F (36.8 C)  TempSrc: Oral Oral Oral Oral  SpO2: 92% 93% 97% 96%  Weight:    79.8 kg  Height:       No intake or output data in the 24 hours ending 06/29/21 0744 Filed Weights   06/27/21 0500 06/28/21 0500 06/29/21 0521  Weight: 81.3 kg 77.5 kg 79.8 kg    Examination:  General exam: Appears calm and comfortable  Respiratory system: Ronchus left side. , chest tube in place.  Cardiovascular system: S1 & S2 heard, RRR. No JVD, murmurs, rubs, gallops or clicks. No pedal edema. Gastrointestinal system: Abdomen is nondistended, soft and nontender. No organomegaly or masses felt. Normal bowel sounds heard. Central nervous system: Alert and oriented.  Extremities: Symmetric 5 x 5 power.    Data Reviewed: I have personally reviewed following labs and imaging studies  CBC: Recent Labs  Lab 06/24/21 2230 06/25/21 0727 06/27/21 0754 06/28/21 0513 06/29/21 0020  WBC 22.2* 28.5* 19.7* 15.8* 14.8*  NEUTROABS 17.8*  --  16.5*  --   --   HGB 13.7 12.3 10.0* 9.2* 9.2*  HCT 41.0 37.6 29.4* 26.1* 26.3*  MCV 90.7 93.5 91.0 89.7 88.9  PLT 299 281 153 168 0000000   Basic Metabolic Panel: Recent Labs  Lab 06/25/21 0727 06/26/21 0545 06/26/21 1030 06/27/21 0125 06/27/21 0754 06/27/21 2157 06/28/21 0308 06/29/21 0020  NA 136   < > 137  --  138 134* 136 132*  K 4.4   < > 3.9  --  3.9 3.4* 3.9 3.7  CL 105   < > 106  --  108 103 103 102  CO2 20*   < > 21*  --  22 21* 22 23  GLUCOSE 132*   < > 96  --  86 138* 154* 113*  BUN 23   < > 29*  --  28* 27* 31* 33*  CREATININE 1.05*   < > 1.15* 1.13* 1.12* 1.06* 1.07* 0.90  CALCIUM 8.2*   < > 8.4*  --  8.3* 8.1* 8.4* 8.6*  MG 1.8  --   --   --   --  2.1 2.1  --   PHOS  --   --   --   --   --   --  2.6 2.1*   < > = values in this  interval not displayed.   GFR: Estimated Creatinine Clearance: 42.3 mL/min (by C-G formula based on SCr of 0.9 mg/dL). Liver Function Tests: Recent Labs  Lab 06/24/21 2230 06/28/21 0308  AST 27 64*  ALT 21 29  ALKPHOS 41 31*  BILITOT 0.5 1.0  PROT 7.9 5.8*  ALBUMIN 4.5 2.6*   No results for input(s): LIPASE, AMYLASE in the last 168 hours. No results for input(s): AMMONIA in the last 168 hours. Coagulation Profile: Recent Labs  Lab 06/24/21 2303  INR 0.9   Cardiac Enzymes: No results for input(s): CKTOTAL, CKMB, CKMBINDEX, TROPONINI in the last 168 hours. BNP (last  3 results) No results for input(s): PROBNP in the last 8760 hours. HbA1C: No results for input(s): HGBA1C in the last 72 hours. CBG: Recent Labs  Lab 06/27/21 1935 06/28/21 0005 06/28/21 0723 06/28/21 1136 06/28/21 1512  GLUCAP 175* 131* 125* 112* 148*   Lipid Profile: No results for input(s): CHOL, HDL, LDLCALC, TRIG, CHOLHDL, LDLDIRECT in the last 72 hours. Thyroid Function Tests: No results for input(s): TSH, T4TOTAL, FREET4, T3FREE, THYROIDAB in the last 72 hours. Anemia Panel: No results for input(s): VITAMINB12, FOLATE, FERRITIN, TIBC, IRON, RETICCTPCT in the last 72 hours. Sepsis Labs: Recent Labs  Lab 06/27/21 0948 06/28/21 0308 06/29/21 0020  PROCALCITON 0.70 1.35 0.95  LATICACIDVEN 1.0  --   --     Recent Results (from the past 240 hour(s))  Resp Panel by RT-PCR (Flu A&B, Covid) Nasopharyngeal Swab     Status: None   Collection Time: 06/24/21 11:04 PM   Specimen: Nasopharyngeal Swab; Nasopharyngeal(NP) swabs in vial transport medium  Result Value Ref Range Status   SARS Coronavirus 2 by RT PCR NEGATIVE NEGATIVE Final    Comment: (NOTE) SARS-CoV-2 target nucleic acids are NOT DETECTED.  The SARS-CoV-2 RNA is generally detectable in upper respiratory specimens during the acute phase of infection. The lowest concentration of SARS-CoV-2 viral copies this assay can detect is 138  copies/mL. A negative result does not preclude SARS-Cov-2 infection and should not be used as the sole basis for treatment or other patient management decisions. A negative result may occur with  improper specimen collection/handling, submission of specimen other than nasopharyngeal swab, presence of viral mutation(s) within the areas targeted by this assay, and inadequate number of viral copies(<138 copies/mL). A negative result must be combined with clinical observations, patient history, and epidemiological information. The expected result is Negative.  Fact Sheet for Patients:  EntrepreneurPulse.com.au  Fact Sheet for Healthcare Providers:  IncredibleEmployment.be  This test is no t yet approved or cleared by the [place] FDA and  has been authorized for detection and/or diagnosis of SARS-CoV-2 by FDA under an Emergency Use Authorization (EUA). This EUA will remain  in effect (meaning this test can be used) for the duration of the COVID-19 declaration under Section 564(b)(1) of the Act, 21 U.S.C.section 360bbb-3(b)(1), unless the authorization is terminated  or revoked sooner.       Influenza A by PCR NEGATIVE NEGATIVE Final   Influenza B by PCR NEGATIVE NEGATIVE Final    Comment: (NOTE) The Xpert Xpress SARS-CoV-2/FLU/RSV plus assay is intended as an aid in the diagnosis of influenza from Nasopharyngeal swab specimens and should not be used as a sole basis for treatment. Nasal washings and aspirates are unacceptable for Xpert Xpress SARS-CoV-2/FLU/RSV testing.  Fact Sheet for Patients: EntrepreneurPulse.com.au  Fact Sheet for Healthcare Providers: IncredibleEmployment.be  This test is not yet approved or cleared by the [place] FDA and has been authorized for detection and/or diagnosis of SARS-CoV-2 by FDA under an Emergency Use Authorization (EUA). This EUA will remain in effect (meaning  this test can be used) for the duration of the COVID-19 declaration under Section 564(b)(1) of the Act, 21 U.S.C. section 360bbb-3(b)(1), unless the authorization is terminated or revoked.  Performed at [facility], [place] [address], [place], [place] [address]   MRSA Next Gen by PCR, Nasal     Status: None   Collection Time: 06/25/21  5:40 AM   Specimen: Nasal Mucosa; Nasal Swab  Result Value Ref Range Status   MRSA by PCR Next  Gen NOT DETECTED NOT DETECTED Final    Comment: (NOTE) The GeneXpert MRSA Assay (FDA approved for NASAL specimens only), is one component of a comprehensive MRSA colonization surveillance program. It is not intended to diagnose MRSA infection nor to guide or monitor treatment for MRSA infections. Test performance is not FDA approved in patients less than 67 years old. Performed at [facility], [place] [address], [place], [place] [address]   Respiratory (~20 pathogens) panel by PCR     Status: None   Collection Time: 06/27/21  9:48 AM   Specimen: Nasopharyngeal Swab; Respiratory  Result Value Ref Range Status   Adenovirus NOT DETECTED NOT DETECTED Final   Coronavirus 229E NOT DETECTED NOT DETECTED Final    Comment: (NOTE) The Coronavirus on the Respiratory Panel, DOES NOT test for the novel  Coronavirus (2019 nCoV)    Coronavirus HKU1 NOT DETECTED NOT DETECTED Final   Coronavirus NL63 NOT DETECTED NOT DETECTED Final   Coronavirus OC43 NOT DETECTED NOT DETECTED Final   Metapneumovirus NOT DETECTED NOT DETECTED Final   Rhinovirus / Enterovirus NOT DETECTED NOT DETECTED Final   Influenza A NOT DETECTED NOT DETECTED Final   Influenza B NOT DETECTED NOT DETECTED Final   Parainfluenza Virus 1 NOT DETECTED NOT DETECTED Final   Parainfluenza Virus 2 NOT DETECTED NOT DETECTED Final   Parainfluenza Virus 3 NOT DETECTED NOT DETECTED Final   Parainfluenza Virus 4 NOT DETECTED NOT DETECTED Final   Respiratory Syncytial Virus NOT  DETECTED NOT DETECTED Final   Bordetella pertussis NOT DETECTED NOT DETECTED Final   Bordetella Parapertussis NOT DETECTED NOT DETECTED Final   Chlamydophila pneumoniae NOT DETECTED NOT DETECTED Final   Mycoplasma pneumoniae NOT DETECTED NOT DETECTED Final    Comment: Performed at [facility], [place]. [address], [place], [place] [address]         Radiology Studies: CT ANGIO NECK W OR WO CONTRAST  Result Date: 06/28/2021 CLINICAL DATA:  Head trauma, skull fracture or hematoma. Carotid artery aneurysm suspected. Left neck line was in the left carotid artery. EXAM: CT ANGIOGRAPHY NECK TECHNIQUE: Multidetector CT imaging of the neck was performed using the standard protocol during bolus administration of intravenous contrast. Multiplanar CT image reconstructions and MIPs were obtained to evaluate the vascular anatomy. Carotid stenosis measurements (when applicable) are obtained utilizing NASCET criteria, using the distal internal carotid diameter as the denominator. CONTRAST:  60m OMNIPAQUE IOHEXOL 350 MG/ML SOLN COMPARISON:  Chest x-ray 06/27/2021 FINDINGS: Aortic arch: 3 vessel arch configuration is present. Dense calcifications are present at the origins the great vessels without significant stenosis. Right carotid system: Mural calcifications. No significant stenosis are present in the right common carotid artery is present. Dense calcifications are present along the medial aspect of the right carotid bifurcation and proximal right ICA without significant stenosis. Atherosclerotic calcifications are present within the cavernous right ICA with moderate to high-grade stenosis of the supraclinoid ICA and slight diminished caliber of the terminal ICA. Right A1 is non dominant. No significant stenosis. MCA bifurcation within normal limits. Proximal branch vessels normal. Left carotid system: Calcified and noncalcified plaque present at the left common carotid origin without significant stenosis. Dense  calcifications are present at the left carotid bifurcation lumen is narrowed to less than 2 mm. This compares with the distal measurement of 4 mm. Distal left common carotid artery scratched at the distal left internal carotid artery is within normal limits. Atherosclerotic calcifications present within the cavernous internal carotid artery without a  significant stenosis relative to the ICA terminus. Left A1 dominant. MCA bifurcation normal. Proximal branch vessels within normal limits. Vertebral arteries: The vertebral arteries originate from the subclavian arteries bilaterally. Left vertebral artery is stenotic at its origin. No additional stenoses are present in either vertebral artery in the neck. Dense calcifications are present at the dural margin of both vertebral arteries. This results in severe stenosis on the right and 50% narrowing on the left. Intracranial scratched at the intracranial vertebral arteries are otherwise normal. Basilar artery is within normal limits. Both posterior cerebral arteries originate from the basilar tip. There is significant contribution on the right from a posterior communicating artery. Skeleton: Multilevel degenerative changes present cervical spine. There is fusion across the disc space at C3-4. No focal osseous lesion is present. Foraminal narrowing is greatest on the right at C4-5 and C5-6 due to uncovertebral spurring. Other neck: Soft tissues the neck are otherwise unremarkable. Salivary glands are within normal limits. Thyroid is normal. No significant adenopathy is present. No focal mucosal or submucosal lesions are present. Upper chest: Diffuse ground-glass attenuation present in the right lung there is some consolidation posteriorly. No residual pneumothorax. IMPRESSION: 1. No acute or focal vascular injury or pseudoaneurysm. 2. 60% stenosis of the left internal carotid artery at the bifurcation. 3. Moderate to high-grade stenosis of the supraclinoid right ICA. 4.  High-grade stenosis of the right vertebral artery at the dural margin. 5. High-grade stenosis of the proximal left vertebral artery. 6. 50% stenosis of the proximal left vertebral artery. 7. Diffuse ground-glass attenuation and consolidation in the right lung. While this may represent atelectasis, infection is not excluded. 8. Multilevel degenerative changes of the cervical spine as described. Electronically Signed   By: San Morelle M.D.   On: 06/28/2021 08:16   DG CHEST PORT 1 VIEW  Result Date: 06/29/2021 CLINICAL DATA:  Chest tube.  Pneumomediastinum.  Pneumothorax. EXAM: PORTABLE CHEST 1 VIEW COMPARISON:  06/28/2021. FINDINGS: Left chest tube in stable position. No pneumothorax. Mediastinum and heart size stable. Persistent right mid lung infiltrate. Persistent bibasilar atelectasis. Persistent right base pneumothorax and or prominent bleb again noted. Tiny bilateral pleural effusions cannot be excluded. Neurostimulator noted stable position. IMPRESSION: 1.  Left chest tube in stable position.  No pneumothorax. 2. Persistent right mid lung infiltrate. Persistent bibasilar atelectasis. Persistent right base pneumothorax and or prominent bleb again noted. Tiny bilateral pleural effusions cannot be excluded. Chest appears unchanged from prior exam. Electronically Signed   By: Marcello Moores  Register   On: 06/29/2021 07:29   DG CHEST PORT 1 VIEW  Result Date: 06/28/2021 CLINICAL DATA:  Chest tube. EXAM: PORTABLE CHEST 1 VIEW COMPARISON:  06/27/2021. FINDINGS: Interim removal of left central line. Mediastinum is stable. Heart size stable. Persistent infiltrate right mid lung. Persistent right base pneumothorax and or prominent bleb again noted. Tiny right pleural effusion. Neurostimulator noted stable position. No acute bony abnormality identified. Mild left chest wall subcutaneous emphysema again noted. IMPRESSION: 1. Interim removal of left central line. Mediastinum appears stable. 2. Persistent  infiltrate right mid lung. Persistent right base pneumothorax and or prominent bleb again noted. Tiny right pleural effusion. Electronically Signed   By: Marcello Moores  Register   On: 06/28/2021 08:59        Scheduled Meds:  Chlorhexidine Gluconate Cloth  6 each Topical Daily   feeding supplement  237 mL Oral BID BM   mouth rinse  15 mL Mouth Rinse BID   multivitamin with minerals  1 tablet Oral Daily  pantoprazole (PROTONIX) IV  40 mg Intravenous Q12H   Continuous Infusions:  sodium chloride Stopped (06/27/21 0941)   ampicillin-sulbactam (UNASYN) IV 3 g (06/29/21 0616)   sodium phosphate  Dextrose 5% IVPB       LOS: 4 days    Time spent: 35 minutes,     Jimesha Rising A Brigham Cobbins, MD [facility]   If 7PM-7AM, please contact night-coverage [email or web address]  06/29/2021, 7:44 AM

## 2021-06-29 NOTE — Progress Notes (Signed)
Performed bladder scan on pt . 720ML in bladder . Will notify MD  Phoebe Sharps, RN

## 2021-06-29 NOTE — Progress Notes (Signed)
OT Cancellation Note  Patient Details Name: Mary Strickland MRN: [id number] DOB: 1930/07/06   Cancelled Treatment:    Reason Eval/Treat Not Completed: Patient not medically ready Pt noted with elevated troponins (2,626). Per MD, recommend hold therapy today. Will follow-up for OT eval tomorrow.   Layla Maw 06/29/2021, 1:31 PM

## 2021-06-29 NOTE — Consult Note (Addendum)
Cardiology Consultation:   Patient ID: Mary Strickland MRN: [id number]; DOB: [birth year]  Admit date: 06/24/2021 Date of Consult: 06/29/2021  PCP:  Burnard Bunting, MD   [facility] Providers Cardiologist:  Peter Martinique, MD     Patient Profile:   Mary Strickland is a 87 y.o. female with a hx of HTN, HLD and palpitation who is being seen 06/29/2021 for the evaluation of elevated troponin at the request of Dr. Tyrell Antonio.  History of Present Illness:   Mary Strickland is a 87 year old female with past medical history of hypertension, hyperlipidemia and palpitation.  Patient has been followed by Dr. Martinique.  Echocardiogram obtained in 2009 was unremarkable.  She was last seen by Coletta Memos, NP on 01/26/2021 at which time she was doing well.  She was taken off of carvedilol due to severe fatigue.  Blood pressure is well controlled on valsartan and amlodipine.  She is also on aspirin daily.  She was in her usual state of health until 06/24/2021.  She woke up in the morning, felt something in her throat.  She went to the bathroom and coughed up some bright red blood.  EMS noted coarse breath sound and if she was hypertensive.  On arrival to [facility] ED, she had sudden onset of oxygen desaturation and altered mental status prompting endotracheal intubation.  According to ED physician's note, after she was intubated, she had a episode of severe hypotension with systolic blood pressure dropping down to the 60s.  This was felt to be related to sedation medication as she was given fentanyl, midazolam and was on propofol drip.  CTA of the chest showed no PE, however showed extensive pneumomediastinum throughout the lung extending into the base of the neck and into the anterior fascial plane of the abdominal wall, small amount of intraperitoneal free air is difficult to fully exclude, moderate-sized left pneumothorax was also present.  Subsequent CT of abdomen and pelvis showed small to moderate volume  intraperitoneal air, given lack of definite bowel pathology, this intraperitoneal gas may be extension of the soft tissue gas from abdominal wall in the thorax into the intraperitoneal cavity.    Patient was seen by pulmonology service and underwent left IJ catheter placement under ultrasound and left chest tube insertion on 06/25/2021.  She was extubated on 7/25.  Echocardiogram obtained on the same day showed EF 60 to 65%, poor acoustic window to assess regional wall motion, no significant valve issue.  Unfortunately, it was evident on 06/27/2021, left IJ was actually placed in the carotid artery, this fortunately was removed without difficulty and without significant bleeding after holding pressure for 30 minutes at the advice of vascular surgery on 7/26.  She has been treated with antibiotic.  After arrival, her troponin was 17-->443-->1372 on 06/26/2021.  This morning, a troponin was repeated around 5 AM on 7/28, troponin was 2626.  Cardiology has been consulted for elevated troponin.  Unfortunately, this morning she had another episode of hemoptysis.  Heart rate is persistently elevated in the 100-110 range.  She denies any recent chest pain.  Breathing is the daughter for stable.  Cardiology has been consulted for elevated troponin.  Note, pulmonology service has discussed the case with the patient's family, they are thinking about comfort care.  Repeat limited echocardiogram was ordered for today to reassess wall motion abnormality, this was not done because of recurrent hemoptysis and increased oxygen requirement.   Past Medical History:  Diagnosis Date   Anxiety    Back  pain    Blood loss anemia    Mild postoperative blood loss anemia   Dyslipidemia    GERD (gastroesophageal reflux disease)    History of diverticulitis of colon    Hypertension    Idiopathic scoliosis    Lumbosacral spondylosis    Mitral valve prolapse    Neuroma, Morton's    both feet,surgery done   Palpitations    hx. of    Psychosis (HCC)    Right carotid bruit    Ulcer    History of ulcers    Past Surgical History:  Procedure Laterality Date   cataracts     both eyes   EXCISION MORTON'S NEUROMA     both feet   LUMBAR FUSION     OTHER SURGICAL HISTORY     hysterectomy   OTHER SURGICAL HISTORY     left breast biopsy/lymph node   TOTAL KNEE ARTHROPLASTY  October 2002   right     Home Medications:  Prior to Admission medications   Medication Sig Start Date End Date Taking? Authorizing Provider  aspirin EC 81 MG tablet Take 81 mg by mouth at bedtime. Swallow whole.   Yes [provider]  Cyanocobalamin (VITAMIN B-12 PO) Take 1 tablet by mouth at bedtime.   Yes [provider]  valsartan (DIOVAN) 40 MG tablet TAKE 1 TABLET BY MOUTH EVERY DAY Patient taking differently: Take 40 mg by mouth at bedtime. 04/25/21  Yes Martinique, Peter M, MD  amLODipine (NORVASC) 5 MG tablet Take 1 tablet (5 mg total) by mouth daily. Patient not taking: No sig reported 01/26/21 06/25/22  Deberah Pelton, NP    Inpatient Medications: Scheduled Meds:  Chlorhexidine Gluconate Cloth  6 each Topical Daily   feeding supplement  237 mL Oral BID BM   guaiFENesin  1,200 mg Oral BID   mouth rinse  15 mL Mouth Rinse BID   multivitamin with minerals  1 tablet Oral Daily   pantoprazole (PROTONIX) IV  40 mg Intravenous Q12H   perflutren lipid microspheres (DEFINITY) IV suspension       tranexamic acid  500 mg Nebulization Once   Continuous Infusions:  sodium chloride Stopped (06/27/21 0941)   ampicillin-sulbactam (UNASYN) IV 3 g (06/29/21 1258)   sodium phosphate  Dextrose 5% IVPB 15 mmol (06/29/21 0956)   PRN Meds: sodium chloride, acetaminophen, ipratropium-albuterol, ondansetron (ZOFRAN) IV  Allergies:    Allergies  Allergen Reactions   Sulfamethoxazole-Trimethoprim Shortness Of Breath and Itching   Percocet [Oxycodone-Acetaminophen] Other (See Comments)    Unknown reaction   Zocor [Simvastatin]  Other (See Comments)    Unknown reaction    Social History:   Social History   Socioeconomic History   Marital status: Widowed    Spouse name: Not on file   Number of children: Not on file   Years of education: Not on file   Highest education level: Not on file  Occupational History   Not on file  Tobacco Use   Smoking status: Former   Smokeless tobacco: Never  Vaping Use   Vaping Use: Never used  Substance and Sexual Activity   Alcohol use: No   Drug use: No   Sexual activity: Not on file  Other Topics Concern   Not on file  Social History Narrative   She is widowed and has one daughter.    Social Determinants of Health   Financial Resource Strain: Not on file  Food Insecurity: Not on file  Transportation Needs: Not  on file  Physical Activity: Not on file  Stress: Not on file  Social Connections: Not on file  Intimate Partner Violence: Not on file    Family History:    Family History  Problem Relation Age of Onset   Heart disease Father    Heart attack Father    Heart disease Mother    Breast cancer Neg Hx      ROS:  Please see the history of present illness.   All other ROS reviewed and negative.     Physical Exam/Data:   Vitals:   06/29/21 0521 06/29/21 0933 06/29/21 1058 06/29/21 1352  BP: 137/84 (!) 142/80 (!) 168/89   Pulse: 92 96 100 (!) 119  Resp: '16 20 20 '$ (!) 24  Temp: 98.3 F (36.8 C)  97.9 F (36.6 C)   TempSrc: Oral  Oral   SpO2: 96% 94% 95% 95%  Weight: 79.8 kg     Height:       No intake or output data in the 24 hours ending 06/29/21 1530 Last 3 Weights 06/29/2021 06/28/2021 06/27/2021  Weight (lbs) 175 lb 14.8 oz 170 lb 13.7 oz 179 lb 3.7 oz  Weight (kg) 79.8 kg 77.5 kg 81.3 kg     Body mass index is 32.18 kg/m.  General:  Well nourished, well developed, in no acute distress HEENT: normal Lymph: no adenopathy Neck: no JVD Endocrine:  No thryomegaly Vascular: No carotid bruits; FA pulses 2+ bilaterally without bruits   Cardiac:  normal S1, S2; RRR; no murmur  Lungs:  clear to auscultation bilaterally, no wheezing, rhonchi or rales  Abd: soft, nontender, no hepatomegaly  Ext: no edema Musculoskeletal:  No deformities, BUE and BLE strength normal and equal Skin: warm and dry  Neuro:  CNs 2-12 intact, no focal abnormalities noted Psych:  Normal affect   EKG:  The EKG was personally reviewed and demonstrates:  NSR without significant ST-T wave changes Telemetry:  Telemetry was personally reviewed and demonstrates:  sinus tachycardia with HR 100-120s. Frequent PACs, no afib.   Relevant CV Studies:  Echo 06/26/2021 1. Left ventricular ejection fraction, by estimation, is 60 to 65%. The  left ventricle has normal function. Left ventricular endocardial border  not optimally defined to evaluate regional wall motion. Indeterminate  diastolic filling due to E-A fusion.   2. Right ventricular systolic function is normal. The right ventricular  size is normal. Tricuspid regurgitation signal is inadequate for assessing  PA pressure.   3. The mitral valve was not well visualized. No evidence of mitral valve  regurgitation. No evidence of mitral stenosis. Moderate mitral annular  calcification.   4. The aortic valve is tricuspid. Aortic valve regurgitation is not  visualized. Mild aortic valve sclerosis is present, with no evidence of  aortic valve stenosis.   5. The inferior vena cava is normal in size with greater than 50%  respiratory variability, suggesting right atrial pressure of 3 mmHg.   6. Technically difficult stutdy with poor acoustic windows.   Laboratory Data:  High Sensitivity Troponin:   Recent Labs  Lab 06/24/21 2230 06/25/21 0727 06/26/21 1030 06/29/21 0500  TROPONINIHS 17 443* 1,372* 2,626*     Chemistry Recent Labs  Lab 06/27/21 2157 06/28/21 0308 06/29/21 0020  NA 134* 136 132*  K 3.4* 3.9 3.7  CL 103 103 102  CO2 21* 22 23  GLUCOSE 138* 154* 113*  BUN 27* 31* 33*   CREATININE 1.06* 1.07* 0.90  CALCIUM 8.1* 8.4* 8.6*  GFRNONAA  51* 50* >60  ANIONGAP '10 11 7    '$ Recent Labs  Lab 06/24/21 2230 06/28/21 0308  PROT 7.9 5.8*  ALBUMIN 4.5 2.6*  AST 27 64*  ALT 21 29  ALKPHOS 41 31*  BILITOT 0.5 1.0   Hematology Recent Labs  Lab 06/27/21 0754 06/28/21 0513 06/29/21 0020  WBC 19.7* 15.8* 14.8*  RBC 3.23* 2.91* 2.96*  HGB 10.0* 9.2* 9.2*  HCT 29.4* 26.1* 26.3*  MCV 91.0 89.7 88.9  MCH 31.0 31.6 31.1  MCHC 34.0 35.2 35.0  RDW 14.5 14.4 14.2  PLT 153 168 178   BNP Recent Labs  Lab 06/24/21 2230  BNP 100.7*    DDimer No results for input(s): DDIMER in the last 168 hours.   Radiology/Studies:  CT ANGIO NECK W OR WO CONTRAST  Result Date: 06/28/2021 CLINICAL DATA:  Head trauma, skull fracture or hematoma. Carotid artery aneurysm suspected. Left neck line was in the left carotid artery. EXAM: CT ANGIOGRAPHY NECK TECHNIQUE: Multidetector CT imaging of the neck was performed using the standard protocol during bolus administration of intravenous contrast. Multiplanar CT image reconstructions and MIPs were obtained to evaluate the vascular anatomy. Carotid stenosis measurements (when applicable) are obtained utilizing NASCET criteria, using the distal internal carotid diameter as the denominator. CONTRAST:  3m OMNIPAQUE IOHEXOL 350 MG/ML SOLN COMPARISON:  Chest x-ray 06/27/2021 FINDINGS: Aortic arch: 3 vessel arch configuration is present. Dense calcifications are present at the origins the great vessels without significant stenosis. Right carotid system: Mural calcifications. No significant stenosis are present in the right common carotid artery is present. Dense calcifications are present along the medial aspect of the right carotid bifurcation and proximal right ICA without significant stenosis. Atherosclerotic calcifications are present within the cavernous right ICA with moderate to high-grade stenosis of the supraclinoid ICA and slight  diminished caliber of the terminal ICA. Right A1 is non dominant. No significant stenosis. MCA bifurcation within normal limits. Proximal branch vessels normal. Left carotid system: Calcified and noncalcified plaque present at the left common carotid origin without significant stenosis. Dense calcifications are present at the left carotid bifurcation lumen is narrowed to less than 2 mm. This compares with the distal measurement of 4 mm. Distal left common carotid artery scratched at the distal left internal carotid artery is within normal limits. Atherosclerotic calcifications present within the cavernous internal carotid artery without a significant stenosis relative to the ICA terminus. Left A1 dominant. MCA bifurcation normal. Proximal branch vessels within normal limits. Vertebral arteries: The vertebral arteries originate from the subclavian arteries bilaterally. Left vertebral artery is stenotic at its origin. No additional stenoses are present in either vertebral artery in the neck. Dense calcifications are present at the dural margin of both vertebral arteries. This results in severe stenosis on the right and 50% narrowing on the left. Intracranial scratched at the intracranial vertebral arteries are otherwise normal. Basilar artery is within normal limits. Both posterior cerebral arteries originate from the basilar tip. There is significant contribution on the right from a posterior communicating artery. Skeleton: Multilevel degenerative changes present cervical spine. There is fusion across the disc space at C3-4. No focal osseous lesion is present. Foraminal narrowing is greatest on the right at C4-5 and C5-6 due to uncovertebral spurring. Other neck: Soft tissues the neck are otherwise unremarkable. Salivary glands are within normal limits. Thyroid is normal. No significant adenopathy is present. No focal mucosal or submucosal lesions are present. Upper chest: Diffuse ground-glass attenuation present in  the right lung there  is some consolidation posteriorly. No residual pneumothorax. IMPRESSION: 1. No acute or focal vascular injury or pseudoaneurysm. 2. 60% stenosis of the left internal carotid artery at the bifurcation. 3. Moderate to high-grade stenosis of the supraclinoid right ICA. 4. High-grade stenosis of the right vertebral artery at the dural margin. 5. High-grade stenosis of the proximal left vertebral artery. 6. 50% stenosis of the proximal left vertebral artery. 7. Diffuse ground-glass attenuation and consolidation in the right lung. While this may represent atelectasis, infection is not excluded. 8. Multilevel degenerative changes of the cervical spine as described. Electronically Signed   By: San Morelle M.D.   On: 06/28/2021 08:16   DG CHEST PORT 1 VIEW  Result Date: 06/29/2021 CLINICAL DATA:  Chest tube.  Pneumomediastinum.  Pneumothorax. EXAM: PORTABLE CHEST 1 VIEW COMPARISON:  06/28/2021. FINDINGS: Left chest tube in stable position. No pneumothorax. Mediastinum and heart size stable. Persistent right mid lung infiltrate. Persistent bibasilar atelectasis. Persistent right base pneumothorax and or prominent bleb again noted. Tiny bilateral pleural effusions cannot be excluded. Neurostimulator noted stable position. IMPRESSION: 1.  Left chest tube in stable position.  No pneumothorax. 2. Persistent right mid lung infiltrate. Persistent bibasilar atelectasis. Persistent right base pneumothorax and or prominent bleb again noted. Tiny bilateral pleural effusions cannot be excluded. Chest appears unchanged from prior exam. Electronically Signed   By: Marcello Moores  Register   On: 06/29/2021 07:29   DG CHEST PORT 1 VIEW  Result Date: 06/28/2021 CLINICAL DATA:  Chest tube. EXAM: PORTABLE CHEST 1 VIEW COMPARISON:  06/27/2021. FINDINGS: Interim removal of left central line. Mediastinum is stable. Heart size stable. Persistent infiltrate right mid lung. Persistent right base pneumothorax and or  prominent bleb again noted. Tiny right pleural effusion. Neurostimulator noted stable position. No acute bony abnormality identified. Mild left chest wall subcutaneous emphysema again noted. IMPRESSION: 1. Interim removal of left central line. Mediastinum appears stable. 2. Persistent infiltrate right mid lung. Persistent right base pneumothorax and or prominent bleb again noted. Tiny right pleural effusion. Electronically Signed   By: Marcello Moores  Register   On: 06/28/2021 08:59   DG Chest Port 1 View  Result Date: 06/27/2021 CLINICAL DATA:  Respiratory failure. EXAM: PORTABLE CHEST 1 VIEW COMPARISON:  06/25/2021.  CT 06/25/2021. FINDINGS: Interim extubation and removal of NG tube. Left IJ line noted projected over the midportion of the heart. Intra-aortic location cannot be excluded and a PA and lateral chest x-ray suggested for further evaluation. Left chest tube in stable position. No left pneumothorax noted on today's exam. Persistent right base pneumothorax and or prominent bleb noted. Low lung volumes with progressive bibasilar atelectasis/infiltrates. Heart size stable. Pneumomediastinum best identified on prior CT. Mild left chest subcutaneous emphysema. Degenerative change thoracic spine. IMPRESSION: 1. Interim extubation removal of NG tube. Left IJ line noted projected over the midportion of the heart. Intra-aortic location cannot be excluded and PA and lateral chest x-ray suggested for further evaluation. 2. Left chest tube in stable position. No left pneumothorax noted on today's exam. Persistent right base pneumothorax and or prominent bleb noted. 3. Low lung volumes with progressive bibasilar atelectasis/infiltrates. 4. Heart size stable. Pneumomediastinum best identified by prior CT. Mild left chest wall subcutaneous emphysema. These results will be called to the ordering clinician or representative by the Radiologist Assistant, and communication documented in the PACS or [employer].  Electronically Signed   By: Marcello Moores  Register   On: 06/27/2021 08:25   DG Chest Port 1 View  Result Date: 06/26/2021 CLINICAL DATA:  Respiratory failure, pneumothorax EXAM: PORTABLE CHEST 1 VIEW COMPARISON:  06/25/2021 FINDINGS: Left chest tube remains present. No definite pneumothorax. Left IJ central line, enteric tube, and endotracheal tubes are also present. Persistent patchy opacities of the left greater than right lungs. Similar left chest wall subcutaneous emphysema and pneumomediastinum. IMPRESSION: Left chest tube present with no definite pneumothorax. Persistent patchy left greater than right pulmonary opacities. Similar subcutaneous emphysema and pneumomediastinum. Electronically Signed   By: Macy Mis M.D.   On: 06/26/2021 08:01   DG CHEST PORT 1 VIEW  Result Date: 06/25/2021 CLINICAL DATA:  Chest tube placement. EXAM: PORTABLE CHEST 1 VIEW COMPARISON:  Same day. FINDINGS: Stable cardiomediastinal silhouette. Endotracheal and nasogastric tubes are in good position. Interval placement of left-sided chest tube. Pneumothorax is no longer visualized. Left basilar atelectasis is noted. Left internal jugular catheter is again noted, but its distal tip is not well visualized. IMPRESSION: Interval placement of left-sided chest tube. No definite pneumothorax is noted. Left internal jugular catheter is again noted, but its distal tip is not well visualized on this radiograph. Electronically Signed   By: Marijo Conception M.D.   On: 06/25/2021 18:24   DG CHEST PORT 1 VIEW  Result Date: 06/25/2021 CLINICAL DATA:  Central line placement. EXAM: PORTABLE CHEST 1 VIEW COMPARISON:  June 25, 2021. FINDINGS: The heart size and mediastinal contours are within normal limits. Endotracheal and nasogastric tubes are unchanged in position. Interval placement of left internal jugular catheter with distal tip in expected position of the right atrium. Moderate size left basilar pneumothorax is noted which is increased  compared to prior exam. Left lower lobe atelectasis or infiltrate is noted. Mild right basilar subsegmental atelectasis is noted. Pneumomediastinum is again noted. The visualized skeletal structures are unremarkable. IMPRESSION: Interval placement of left internal jugular catheter with distal tip in expected position of right atrium; withdrawal by 3-4 cm is recommended. Moderate left basilar pneumothorax is noted which is increased compared to prior exam. These results will be called to the ordering clinician or representative by the Radiologist Assistant, and communication documented in the PACS or [employer]. Electronically Signed   By: Marijo Conception M.D.   On: 06/25/2021 16:49   ECHOCARDIOGRAM COMPLETE  Result Date: 06/26/2021    ECHOCARDIOGRAM REPORT   Patient Name:   [facility] Date of Exam: 06/26/2021 Medical Rec #:  [id number]           Height:       62.0 in Accession #:    [id number]          Weight:       177.0 lb Date of Birth:  1930-10-17           BSA:          1.815 m Patient Age:    52 years            BP:           136/56 mmHg Patient Gender: F                   HR:           122 bpm. Exam Location:  Inpatient Procedure: 2D Echo, Cardiac Doppler, Color Doppler and Intracardiac            Opacification Agent Indications:    Elevated Troponin  History:        Patient has no prior history of Echocardiogram examinations.  Risk Factors:Hypertension, Dyslipidemia and Former Smoker.  Sonographer:    Vickie Epley RDCS Referring Phys: [id number] [place] Comments: Technically difficult study due to poor echo windows. Image acquisition challenging due to respiratory motion. IMPRESSIONS  1. Left ventricular ejection fraction, by estimation, is 60 to 65%. The left ventricle has normal function. Left ventricular endocardial border not optimally defined to evaluate regional wall motion. Indeterminate diastolic filling due to E-A fusion.  2. Right ventricular  systolic function is normal. The right ventricular size is normal. Tricuspid regurgitation signal is inadequate for assessing PA pressure.  3. The mitral valve was not well visualized. No evidence of mitral valve regurgitation. No evidence of mitral stenosis. Moderate mitral annular calcification.  4. The aortic valve is tricuspid. Aortic valve regurgitation is not visualized. Mild aortic valve sclerosis is present, with no evidence of aortic valve stenosis.  5. The inferior vena cava is normal in size with greater than 50% respiratory variability, suggesting right atrial pressure of 3 mmHg.  6. Technically difficult stutdy with poor acoustic windows. FINDINGS  Left Ventricle: Left ventricular ejection fraction, by estimation, is 60 to 65%. The left ventricle has normal function. Left ventricular endocardial border not optimally defined to evaluate regional wall motion. Definity contrast agent was given IV to delineate the left ventricular endocardial borders. The left ventricular internal cavity size was normal in size. There is no left ventricular hypertrophy. Indeterminate diastolic filling due to E-A fusion. Right Ventricle: The right ventricular size is normal. No increase in right ventricular wall thickness. Right ventricular systolic function is normal. Tricuspid regurgitation signal is inadequate for assessing PA pressure. Left Atrium: Left atrial size was normal in size. Right Atrium: Right atrial size was normal in size. Pericardium: There is no evidence of pericardial effusion. Mitral Valve: The mitral valve was not well visualized. There is mild thickening of the mitral valve leaflet(s). Moderate mitral annular calcification. No evidence of mitral valve regurgitation. No evidence of mitral valve stenosis. Tricuspid Valve: The tricuspid valve is normal in structure. Tricuspid valve regurgitation is trivial. Aortic Valve: The aortic valve is tricuspid. Aortic valve regurgitation is not visualized. Mild  aortic valve sclerosis is present, with no evidence of aortic valve stenosis. Pulmonic Valve: The pulmonic valve was normal in structure. Pulmonic valve regurgitation is not visualized. Aorta: The aortic root is normal in size and structure. Venous: The inferior vena cava is normal in size with greater than 50% respiratory variability, suggesting right atrial pressure of 3 mmHg. IAS/Shunts: No atrial level shunt detected by color flow Doppler.  LEFT VENTRICLE PLAX 2D LVOT diam:     2.10 cm LV SV:         76 LV SV Index:   42 LVOT Area:     3.46 cm  LV Volumes (MOD) LV vol d, MOD A4C: 54.3 ml LV vol s, MOD A4C: 20.7 ml LV SV MOD A4C:     54.3 ml RIGHT VENTRICLE TAPSE (M-mode): 1.9 cm LEFT ATRIUM           Index       RIGHT ATRIUM           Index LA Vol (A2C): 25.9 ml 14.27 ml/m RA Area:     15.30 cm LA Vol (A4C): 47.6 ml 26.22 ml/m RA Volume:   36.90 ml  20.33 ml/m  AORTIC VALVE LVOT Vmax:   131.00 cm/s LVOT Vmean:  104.000 cm/s LVOT VTI:    0.219 m  AORTA Ao Root diam: 3.30  cm  SHUNTS Systemic VTI:  0.22 m Systemic Diam: 2.10 cm Loralie Champagne MD Electronically signed by Loralie Champagne MD Signature Date/Time: 06/26/2021/5:20:16 PM    Final      Assessment and Plan:   Elevated troponin  - hs trop from 7/23-7/25   [phone] on 06/26/2021.  This morning, a troponin was repeated around 5 AM on 7/28, troponin was 2626  -EKG shows no obvious ischemic changes.  Recent echocardiogram obtained on 7/25 showed normal EF.  Wall motion was normal able to be seen due to poor acoustic window and pneumomediastinum.  Discussed with MD, repeat limited echocardiogram is unlikely to change her overall condition and our management, will cancel limited echocardiogram at this time.  Suspicion for acute ACS fairly low, elevation of the troponin is likely related to acute respiratory failure, pneumothorax and pneumomediastinum.  She is persistently tachycardic with heart rate in the 100-120s range.  Pulmonology service suggested  family may be considering comfort care.  Recurrent hemoptysis: She presented with hemoptysis on 7/23, had recurrent hemoptysis this morning.  Pulmonology service on board.  Patient is unlikely a candidate for any invasive study.  On aspirin at home but no other blood thinner.  Aspirin has been discontinued upon admission.  Acute respiratory failure secondary to #2    -Continue to have bilateral rhonchi on physical exam, unclear if related to the recent hemoptysis.  Followed by pulmonology service.  Required increased oxygen demand today.  Significant pneumomediastinum with moderate left pneumothorax: Seen on CT image, has a left sided chest tube placed on 7/24  Malpositioned left IJ: Placed on 7/24, found to be in left carotid artery on 7/26, this has been removed under guidance by vascular surgery without further issue.     Risk Assessment/Risk Scores:                For questions or updates, please contact [place] Please consult [email or web address] for contact info under    Signed, Almyra Deforest, [place]  06/29/2021 3:30 PM  Patient seen and examined   I agree with findings of H Meng above.   Pt is an 87 yo with hx of HT, HL, palpitations.   Followed by P Jordan/J Cleaver. The pt presented on 7/23 ith hemoptysis, hypertensive.  Desaturated.  Intubated   Became hypotensive. Pressors started  CT of chest showed no PE but pneumomediastinum, pneumoperitoneum.  PT als with pneumothorax.  CT placed   IJ placed (ultimately found in carotid a).     Asked to see for elevated troponin.   On 06/26/21 was 1372 peak   Today 2626.    Echo on 06/26/21   LVEF normal, RVEF normal   No definite wall motion abnormalities though difficult windows (due to air)  Pt patient denies CP   Says she has not done a lot of coughing though today she did cough up some blood  On exam, pt laying with HOB elevated  BP 120s-160s/   P 90s to 120 Neck  R neck sl swollen with dry dressing  Lungs   Diffuse rhonchi, rales   Cardiac exam:   RRR  No signif murmurs   Abd   Nontender   Supple Ext   Warm feet  No edema  2+ pulses    Elevated troponin.  Most likely demand ischemia in setting of CAD (seen on CT), hypotension, hypoxemia.   The troponin today is elevated, higher than on 7/25 BUT not clear how how peak was on 7/26 (not taken)  Ovrerall pt has been without chest pain      Latest troponin is coming down   (1909)  Given no CP and other medical issues (hemoptysis today) would pursue medical Rx   Could try VERY LOW dose b blcoker for BP/HR  Very cautious to avoid hypotenson    No ASA   Not clear that statin would add anything       Watch I/O    Will continue to follow    Dorris Carnes MD

## 2021-06-30 DIAGNOSIS — J9601 Acute respiratory failure with hypoxia: Secondary | ICD-10-CM | POA: Diagnosis not present

## 2021-06-30 DIAGNOSIS — R042 Hemoptysis: Secondary | ICD-10-CM | POA: Diagnosis not present

## 2021-06-30 DIAGNOSIS — J9311 Primary spontaneous pneumothorax: Secondary | ICD-10-CM

## 2021-06-30 LAB — BASIC METABOLIC PANEL
Anion gap: 8 (ref 5–15)
BUN: 29 mg/dL — ABNORMAL HIGH (ref 8–23)
CO2: 26 mmol/L (ref 22–32)
Calcium: 8.6 mg/dL — ABNORMAL LOW (ref 8.9–10.3)
Chloride: 99 mmol/L (ref 98–111)
Creatinine, Ser: 0.89 mg/dL (ref 0.44–1.00)
GFR, Estimated: >60 mL/min (ref >60)
Glucose, Bld: 87 mg/dL (ref 70–99)
Potassium: 3.7 mmol/L (ref 3.5–5.1)
Sodium: 133 mmol/L — ABNORMAL LOW (ref 135–145)

## 2021-06-30 LAB — CBC
HCT: 24.6 % — ABNORMAL LOW (ref 36.0–46.0)
Hemoglobin: 8.5 g/dL — ABNORMAL LOW (ref 12.0–15.0)
MCH: 30.8 pg (ref 26.0–34.0)
MCHC: 34.6 g/dL (ref 30.0–36.0)
MCV: 89.1 fL (ref 80.0–100.0)
Platelets: 187 10*3/uL (ref 150–400)
RBC: 2.76 MIL/uL — ABNORMAL LOW (ref 3.87–5.11)
RDW: 14 % (ref 11.5–15.5)
WBC: 12.7 10*3/uL — ABNORMAL HIGH (ref 4.0–10.5)
nRBC: 0 % (ref 0.0–0.2)

## 2021-06-30 LAB — PHOSPHORUS: Phosphorus: 3.3 mg/dL (ref 2.5–4.6)

## 2021-06-30 MED ORDER — FENTANYL CITRATE (PF) 100 MCG/2ML IJ SOLN
25.0000 ug | INTRAMUSCULAR | Status: DC | PRN
Start: 2021-06-30 — End: 2021-07-16
  Administered 2021-07-04: 25 ug via INTRAVENOUS
  Filled 2021-06-30: qty 2

## 2021-06-30 NOTE — Progress Notes (Signed)
PT Cancellation Note  Patient Details Name: Mary Strickland MRN: [id number] DOB: May 27, 1930   Cancelled Treatment:    Reason Eval/Treat Not Completed: Other (comment). Appears pt is transitioning to comfort care. Will sign off.    Shary Decamp [facility] 06/30/2021, 9:07 AM [place] Pager [phone] Office [phone]

## 2021-06-30 NOTE — Progress Notes (Signed)
OT Cancellation Note  Patient Details Name: Mary Strickland MRN: [id number] DOB: 05-Apr-1930   Cancelled Treatment:    Reason Eval/Treat Not Completed: Other (comment) Appears pt will be transitioning to comfort care. Will sign off at this time.   Layla Maw 06/30/2021, 12:27 PM
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
## 2021-06-30 NOTE — Plan of Care (Signed)
   Brief POC Note:  Patient planned for transition to CC; which is reasonable. No cardiac meds (metoprolol, Asa, valsartan, amlodipine)_ are for sx control and could be discontinued. Will defer f/u visit at this time. No further testing is required. Discussed with primary MD.   [facility] will sign off.   Medication Recommendations:  as above Other recommendations (labs, testing, etc):  NA Follow up as an outpatient:  NA  For questions or updates, please contact [place] Please consult [email or web address] for contact info under Cardiology/STEMI.      Signed, Werner Lean, MD  06/30/2021, 8:47 AM

## 2021-06-30 NOTE — Progress Notes (Signed)
NAME:  Mary Strickland, MRN:  [id number], DOB:  1930/07/04, LOS: 5 ADMISSION DATE:  06/24/2021, CONSULTATION DATE:  06/24/21 REFERRING MD:  ED Doc, CHIEF COMPLAINT:  hemoptysis   BRIEF  This 87 y.o. Caucasian female prior smoker presented with complaints of acute onset hemoptysis.   In the ER, she was initially observed to be hemodynamically stable with no acute finding seen on CXR but while in the ED she  had a sudden bout of oxygen desaturation to the 80s and demonstrated altered mental status/confusion, prompting endotracheal intubation 06/24/2021, CTA obtained post intubation and revealed pneumomediastinum and left pneumothorax  Pertinent  Medical History  HTN HLD GERD Anxiety Anemia  Mitral valve prolapse   Significant Hospital Events:  06/24/2021 - admitted at 2251 7/24 - CT scan with pneumomediastinum, pneumothorax, extensive soft tissue gas in the abdominal wall 7/25 central line placed, left chest tube placed -> extubated.  Pressors being weaned. Awake and interactive  7/27 No acute events overnight 7/28 Active hemoptysis, Palliation consulted for comfort care  Interim History / Subjective:   Subjective.  Endorses SOB/Congestion. No further hemoptysis overnight, + Productive cough. Denies chest pain. Denies N&V. She is confused , but pleasant.  On 6 L with sats of 98%, NAD WBC is 12.7, HGB is 8.5, platelets are 187, T max 98.9 Na 133, K 3.7, Creatinine 0.89 Net + 1326 cc's since admission, Net negative 129 cc's last 24 hours, 1100 uo last 24 hours Palliation consult was made 06/29/2021 by primary team.     Objective   Blood pressure (!) 154/75, pulse 93, temperature 98.1 F (36.7 C), temperature source Oral, resp. rate 20, height '5\' 2"'$  (1.575 m), weight 79 kg, SpO2 99 %. On 9L [place].        Intake/Output Summary (Last 24 hours) at 06/30/2021 1004 Last data filed at 06/30/2021 0533 Gross per 24 hour  Intake 970.92 ml  Output 1100 ml  Net -129.08 ml   Filed Weights    06/28/21 0500 06/29/21 0521 06/30/21 0408  Weight: 77.5 kg 79.8 kg 79 kg    Examination: General: in bed, frail, ill appearing, pleasant in no distress HEENT: MM pink/moist, anicteric, atraumatic Neuro: Awake and alert, confused at baseline, MAE x 4 A&O x 3 CV: S1S2, ST with rare PAC, no m/r/g appreciated PULM:  rhonci in the upper lobes and in the lower lobes, Trachea midline, bilateral chest excursion, no  hemoptysis. Left chest tube has been removed, no recurrent pneumothorax GI: soft, bsx4 active, non distended , BS + Extremities: warm/dry, no pre tibial edema, 1+ upper extremity edema, capillary refill less than 3 seconds  Skin: no rashes or lesions, Clean dry and intact   Resolved Hospital Problem list     Assessment & Plan:   Acute hypoxemic respiratory failure in setting of CAP with bilateral pulmonary infiltrates  Hemoptysis- Active Emphysema Malpositioned CVC Left pneumothorax with pneumomediastinum  chest tube placed 7/26, water sealed on 7/27, CXR on 7/28 demonstrates no pneumo on film. Removed 7/28. RML infiltrate. Leukocytosis downtrending. Afebrile. O2 requirement increasing are  6L Hemoptysis in sputum expectorated in cup at bedside. Suspect increasing O2 requirement secondary to bleeding. Endorses this blood overnight.   P: -Dr. Loanne Drilling spoke with Candida Peeling ([place]) 7/28 by  phone to explain situation with active hemoptysis. Family would not want her intubated for procedure. Decision by family  to proceed with comfort measures (see Dr. Cordelia Pen note). Dr. Tyrell Antonio notified via phone and plans on consulting palliative for assistance with  symptom management.  Plan -Nebulized TXA as ordered.  -PRN suction as needed -Continue supplemental oxygen as needed. Goal O2 sats 92-98%. Adjust to goal. -Continue pulmonary hygiene with IS, coughing, q6h chest pt, and mobilization as able -Continue empiric PPI -Continue Unasyn. -Continue guaifenesin  -Follow sputum  cultures. -Follow up on cardiology recommendations. -Chest tube removed today. Will obtain post chest tube removal CXR two hours post pull. -Continue to monitor CVC site. -Continue SCD. Not a candidate for anticoagulation due to active bleeding.  - heparin has been stopped for DVT PPX.  -Tailor interventions to patient comfort.  Palliation has been consulted. Patient is in no distress and is comfortable with sats of 98% on 6 L.  As family are not opting for Bronchoscopy, or work up of hemoptysis, and plan is for comfort care, PCCM will sign off . Please don't hesitate to call if we can be of additional assistance.   HTN Transaminases Anemia Hyponatremia Hypophosphatemia -All other issues per primary  Best Practice    Diet/type: Regular consistency (see orders) DVT prophylaxis: SCD GI prophylaxis: PPI Lines: N/A Foley:  N/A Code Status:  DNR Last date of multidisciplinary goals of care discussion: Today, see Dr. Rosario Adie note   Critical care: NA  Magdalen Spatz, MSN, AGACNP-BC [place] for personal pager PCCM on call pager [phone] >> hospital use only 06/30/2021 , 10:04 AM  Please see [email or web address] for pager details  If no response, please call [phone] After hours, please call Elink at [phone]

## 2021-07-01 DIAGNOSIS — Z515 Encounter for palliative care: Secondary | ICD-10-CM

## 2021-07-01 DIAGNOSIS — Z66 Do not resuscitate: Secondary | ICD-10-CM

## 2021-07-01 DIAGNOSIS — Z7189 Other specified counseling: Secondary | ICD-10-CM

## 2021-07-01 DIAGNOSIS — J9601 Acute respiratory failure with hypoxia: Secondary | ICD-10-CM | POA: Diagnosis not present

## 2021-07-01 LAB — QUANTIFERON-TB GOLD PLUS: QuantiFERON-TB Gold Plus: UNDETERMINED — AB

## 2021-07-01 LAB — QUANTIFERON-TB GOLD PLUS (RQFGPL)
QuantiFERON Mitogen Value: 0.17 IU/mL
QuantiFERON Nil Value: 0 IU/mL
QuantiFERON TB1 Ag Value: 0 IU/mL
QuantiFERON TB2 Ag Value: 0 IU/mL

## 2021-07-01 LAB — PHOSPHORUS: Phosphorus: 2.9 mg/dL (ref 2.5–4.6)

## 2021-07-01 MED ORDER — NYSTATIN 100000 UNIT/ML MT SUSP
5.0000 mL | Freq: Four times a day (QID) | OROMUCOSAL | Status: DC
  Administered 2021-07-01 – 2021-07-13 (×44): 500000 [IU] via ORAL
  Filled 2021-07-01 (×44): qty 5
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
## 2021-07-01 NOTE — Consult Note (Signed)
Palliative Medicine Inpatient Consult Note  Consulting Provider: Elmarie Shiley, MD  Reason for consult:   Palliative Medicine Consult    Symptom Management Consult  Reason for Consult? Goals of care   HPI:  Per intake H&P --> 87 year old prior history of smoking, hypertension, mitral valve prolapse who was admitted with acute onset hemoptysis.  In the ED, she was initially observed to be hemodynamically stable with no acute findings seen on chest x-ray.  While in the ED she had a sudden drop of oxygen saturation to the 80s and demonstrated altered mental status/confusion, prompting endotracheal intubation on 06/24/2021, CTA obtained post intubation revealed pneumomediastinum and left pneumothorax.  Patient was admitted by CCM, she had a left-sided chest tube placed on 7/25.  She was subsequently extubated on 7/25th.  She has been off of IV pressors.  CTA chest was negative for PE, showed extensive pneumomediastinum throughout the chest extending into the base of the neck, question a degree of pneumopericardium as well.  Patient care was transferred to [facility] on 7/28.  CCM will continue to follow for chest tube management  Palliative care was asked to get involved to further discuss goals of care.  Clinical Assessment/Goals of Care:  *Please note that this is a verbal dictation therefore any spelling or grammatical errors are due to the "[place]" system interpretation.  I have reviewed medical records including EPIC notes, labs and imaging, received report from bedside RN, assessed the patient who was lying in bed. She is very hard of hearing so I pulled my mask down to allow her to read my lips throughout our conversation which she did well with.    I met with [name] to further discuss diagnosis prognosis, GOC, EOL wishes, disposition and options.  A brief review of Mary Strickland's medical history was complete.  We reviewed her history of mitral valve prolapse, hypertension, and  recent acute hemoptysis, pneumothorax requiring a chest tube, elevated troponins indicating heart strain.  She appeared to have good understanding of the majority of these diagnoses though she shares prior she has "vaguely remembered conversations regarding the chest tube".  I introduced Palliative Medicine as specialized medical care for people living with serious illness. It focuses on providing relief from the symptoms and stress of a serious illness. The goal is to improve quality of life for both the patient and the family.  Any lives in [place], [place].  She is a widow.  She has 1 daughter who lives in [place].  She used to do secretarial work.  She is a woman of faith and practices within the [employer] [facility] denomination.  To admission and he had been living in a single-family home and was fully independent of all B ADLs and IADLs.  She used a 1 point cane for assistance.  A detailed discussion was had today regarding advanced directives does have these and a request has been made to her granddaughter so that we may scan them into the medical record system.    Concepts specific to code status, artifical feeding and hydration, continued IV antibiotics and rehospitalization was had.  Patient is clear about not wanting chest compressions or intubation moving forward.    Patient goals at this point in time are to continue to improve her strength.  She is open to skilled nursing if this is something which would improve her condition.  She is very much on board with conservative modalities of care though if improvements could be made she is very  clear she would wish for those to be pursued.  Any was able to get up with me with one-person assist and mobilized to the bedside chair.  Does appear that she has rehabilitation potential.  Discussed the importance of continued conversation with family and their  medical providers regarding overall plan of care and treatment options, ensuring decisions are  within the context of the patients values and GOCs. -------------------------------------------------------- Addendum:  I was able to speak to patient's granddaughter, [name] this afternoon.  I shared with her that I had coordinated with both her primary care team as well as Dr. Shearon Stalls on the pulmonology team.  We are all in agreement to continue to provide conservative measures of care.  [name] shares that she had in the past said that she would want any to not be reintubated but if there were things which the medical team could do to improve her present state she and her mother would wish for those.    [name] is on board with potential SNF transfer once and he is medically optimized.  She request that patient is transferred to [facility] in the [place] area.  I shared with her that I will pass this along to our case management team who can further help with coordination.  We reviewed that it will not be safe for any to live alone anymore and her granddaughter shares that she does have the financial resources to be in an assisted living facility and ideally would wish for her to go to [facility].  I was able to secure chat the medical social worker in regards to consideration of placement around the [place] area.  PT/OT evaluation is pending.  Decision Maker: [name] (granddaughter) - [phone]  SUMMARY OF RECOMMENDATIONS   DNAR/DNI  Obtaining a copy of advance directives  Appreciate medical social worker helping patient's granddaughter with potential placement options in the [place] area.  Patient does have the financial resources to afford ambulance transportation.  Appreciate PT/OT evaluations  Wean O2 as tolerated  Ongoing palliative care support  Code Status/Advance Care Planning: DNAR/DNI   Palliative Prophylaxis:  Oral care, mobility  Additional Recommendations (Limitations, Scope, Preferences): Continue current scope of treating the treatable    Psycho-social/Spiritual:  Desire for further Chaplaincy support: No Additional Recommendations: Education on acute disease processes   Prognosis: Guarded in the setting of recent hemoptysis though remains stable. Was fully functionally physically and mentally prior to admission.   Discharge Planning: Will likely transition to facility in [place] when medically optimized.  Vitals:   06/30/21 2312 07/01/21 0331  BP: 133/69 (!) 119/57  Pulse: (!) 102 86  Resp: 16 20  Temp: 98.8 F (37.1 C) 99 F (37.2 C)  SpO2: 96% 94%    Intake/Output Summary (Last 24 hours) at 07/01/2021 0981 Last data filed at 07/01/2021 0620 Gross per 24 hour  Intake 560 ml  Output 1600 ml  Net -1040 ml   Last Weight  Most recent update: 07/01/2021  3:35 AM    Weight  79.1 kg (174 lb 6.1 oz)            Gen:  Elderly F in NAD HEENT: Oral thrush, dry mucous membranes CV: Irregular rate and rhythm  PULM: On 4LPM [place] ABD: soft/nontender  EXT: No edema  Neuro: Alert and oriented x3   PPS: 50%   This conversation/these recommendations were discussed with patient primary care team, Dr. Tyrell Antonio  Time In: 1430 Time Out: 1550 Total Time: 80 Greater than 50%  of this time was spent counseling and coordinating care related to the above assessment and plan.  [place] Team Team Cell Phone: [phone] Please utilize secure chat with additional questions, if there is no response within 30 minutes please call the above phone number  Palliative Medicine Team providers are available by phone from 7am to 7pm daily and can be reached through the team cell phone.  Should this patient require assistance outside of these hours, please call the patient's attending physician.

## 2021-07-02 DIAGNOSIS — Z515 Encounter for palliative care: Secondary | ICD-10-CM | POA: Diagnosis not present

## 2021-07-02 DIAGNOSIS — J9601 Acute respiratory failure with hypoxia: Secondary | ICD-10-CM | POA: Diagnosis not present

## 2021-07-02 NOTE — Plan of Care (Signed)
  Problem: Safety: Goal: Non-violent Restraint(s) Outcome: Progressing   

## 2021-07-02 NOTE — Progress Notes (Signed)
PROGRESS NOTE    Mary Strickland  [id number] DOB: 1930/11/03 DOA: 06/24/2021 PCP: Burnard Bunting, MD   Brief Narrative: 87 year old prior history of smoking, hypertension, mitral valve prolapse who was admitted with acute onset hemoptysis.  In the ED, she was initially observed to be hemodynamically stable with no acute findings seen on chest x-ray.  While in the ED she had a sudden drop of oxygen saturation to the 80s and demonstrated altered mental status/confusion, prompting endotracheal intubation on 06/24/2021, CTA obtained post intubation revealed pneumomediastinum and left pneumothorax.  Patient was admitted by CCM, she had a left-sided chest tube placed on 7/25.  She was subsequently extubated on 7/25th.  She has been off of IV pressors.  CTA chest was negative for PE, showed extensive pneumomediastinum throughout the chest extending into the base of the neck, question a degree of pneumopericardium as well.  Patient care was transferred to [facility] on 7/28.  CCM will continue to follow for chest tube management   Assessment & Plan:   Principal Problem:   Acute hypoxemic respiratory failure (HCC) Active Problems:   Hypertension   Hemoptysis   Abnormal CXR   Leukocytosis   Subcutaneous emphysema (HCC)   Pneumoperitoneum of unknown etiology   Pneumothorax on right  1-Acute Respiratory Failure in the setting of CAP and bilateral pulmonary infiltrates, left pneumothorax with pneumomediastinum: -Hemoptysis initial concern was for alveolar hemorrhage versus hematemesis.  No further episode of hemoptysis since admission. -Patient was intubated on arrival 7/23 s/p extubation on 7/25. -Patient had left side chest tube placement 7/25. -Chest x-ray 7/28 two-point stable position. -Continue with Unasyn. 8/10. -Flutter valve and continue with Mucinex.  -CCM following. Patient develop hemoptysis again and worsening hypoxemia. CCM discussed with family plan is for more comfort care  approach. Palliative care consulted. Plan for conservative measure of care. Plan for SNF for rehab.  -Chest tube removed 7/28 -Follow WBC trend. Trending down.  Currently on 4 L oxygen.   2-Hypophosphatemia: Replaced.   3-Elevation of troponin: Troponin continued to increase.  Patient denies chest pain.  Plan to repeat EKG.  Plan to repeat limited echo.  Cardiology has been consulted. Elevation of troponin likely related to Respiratory failure Pneumothorax and pneumomediastinum, demand ischemia from CAD.  Started low dose metoprolol/  ECHO 7/25: Fraction 60 to 65%.  Left ventricular function is normal.  Right ventricular function is normal.  4-malpositioned left IJ CVL: CTA neck 7/27 no acute or focal vascular injury or pseudoaneurysmal Vascular was initially consulted and they removed line.   5-Hypertension: Continue to hold Diovan and Norvasc, home medication. Continue with as needed hydralazine.  Transaminases: Possible related to hypovolemia.  Follow trend  Anemia: follow hb Mild Hyponatremia; monitor.  Dizziness; resolved.  Monitor.   Nutrition Problem: Inadequate oral intake Etiology: inability to eat    Signs/Symptoms: NPO status    Interventions: MVI, Ensure Enlive (each supplement provides 350kcal and 20 grams of protein), Other (Comment) (downgrade diet)  Estimated body mass index is 32.78 kg/m as calculated from the following:   Height as of this encounter: '5\' 2"'$  (1.575 m).   Weight as of this encounter: 81.3 kg.   DVT prophylaxis: Not anticoagulation due to initial presentation of hemoptysis Code Status: DNR Family Communication: care disucussed with grandaughter 7/31   Disposition Plan:  Status is: Inpatient  Remains inpatient appropriate because:Hemodynamically unstable  Dispo: The patient is from: Home              Anticipated d/c is to:  Be determined              Patient currently is not medically stable to d/c.   Difficult to place patient  No        Consultants:  CCM admitted patient Cardiology Vascular surgery  Procedures:  Echo: Normal ejection fraction  Antimicrobials:  Unasyn  Subjective: She is feeling better, congestion improved.   Objective: Vitals:   07/02/21 0500 07/02/21 0804 07/02/21 0900 07/02/21 1157  BP:  136/68  137/75  Pulse: 89 87 65 92  Resp: (!) 22 (!) 25 (!) 22 (!) 22  Temp:  99 F (37.2 C)  99 F (37.2 C)  TempSrc:  Oral  Oral  SpO2: 94% 94% 92% 97%  Weight: 81.3 kg     Height:        Intake/Output Summary (Last 24 hours) at 07/02/2021 1428 Last data filed at 07/01/2021 1500 Gross per 24 hour  Intake 200 ml  Output --  Net 200 ml    Filed Weights   06/30/21 0408 07/01/21 0331 07/02/21 0500  Weight: 79 kg 79.1 kg 81.3 kg    Examination:  General exam: NAD Respiratory system: BL ronchus Cardiovascular system: S 1 S 2 RRR Gastrointestinal system: BS present, soft, nt Central nervous system: ALert Extremities: No edema    Data Reviewed: I have personally reviewed following labs and imaging studies  CBC: Recent Labs  Lab 06/27/21 0754 06/28/21 0513 06/29/21 0020 06/30/21 0050  WBC 19.7* 15.8* 14.8* 12.7*  NEUTROABS 16.5*  --   --   --   HGB 10.0* 9.2* 9.2* 8.5*  HCT 29.4* 26.1* 26.3* 24.6*  MCV 91.0 89.7 88.9 89.1  PLT 153 168 178 [id number]    Basic Metabolic Panel: Recent Labs  Lab 06/27/21 0754 06/27/21 2157 06/28/21 0308 06/29/21 0020 06/30/21 0050 07/01/21 0058  NA 138 134* 136 132* 133*  --   K 3.9 3.4* 3.9 3.7 3.7  --   CL 108 103 103 102 99  --   CO2 22 21* '22 23 26  '$ --   GLUCOSE 86 138* 154* 113* 87  --   BUN 28* 27* 31* 33* 29*  --   CREATININE 1.12* 1.06* 1.07* 0.90 0.89  --   CALCIUM 8.3* 8.1* 8.4* 8.6* 8.6*  --   MG  --  2.1 2.1  --   --   --   PHOS  --   --  2.6 2.1* 3.3 2.9    GFR: Estimated Creatinine Clearance: 43.2 mL/min (by C-G formula based on SCr of 0.89 mg/dL). Liver Function Tests: Recent Labs  Lab 06/28/21 0308  AST  64*  ALT 29  ALKPHOS 31*  BILITOT 1.0  PROT 5.8*  ALBUMIN 2.6*    No results for input(s): LIPASE, AMYLASE in the last 168 hours. No results for input(s): AMMONIA in the last 168 hours. Coagulation Profile: No results for input(s): INR, PROTIME in the last 168 hours.  Cardiac Enzymes: No results for input(s): CKTOTAL, CKMB, CKMBINDEX, TROPONINI in the last 168 hours. BNP (last 3 results) No results for input(s): PROBNP in the last 8760 hours. HbA1C: No results for input(s): HGBA1C in the last 72 hours. CBG: Recent Labs  Lab 06/28/21 0005 06/28/21 0723 06/28/21 1136 06/28/21 1512 06/29/21 0855  GLUCAP 131* 125* 112* 148* 106*    Lipid Profile: No results for input(s): CHOL, HDL, LDLCALC, TRIG, CHOLHDL, LDLDIRECT in the last 72 hours. Thyroid Function Tests: No results for input(s): TSH, T4TOTAL, FREET4, T3FREE,  THYROIDAB in the last 72 hours. Anemia Panel: No results for input(s): VITAMINB12, FOLATE, FERRITIN, TIBC, IRON, RETICCTPCT in the last 72 hours. Sepsis Labs: Recent Labs  Lab 06/27/21 0948 06/28/21 0308 06/29/21 0020  PROCALCITON 0.70 1.35 0.95  LATICACIDVEN 1.0  --   --      Recent Results (from the past 240 hour(s))  Resp Panel by RT-PCR (Flu A&B, Covid) Nasopharyngeal Swab     Status: None   Collection Time: 06/24/21 11:04 PM   Specimen: Nasopharyngeal Swab; Nasopharyngeal(NP) swabs in vial transport medium  Result Value Ref Range Status   SARS Coronavirus 2 by RT PCR NEGATIVE NEGATIVE Final    Comment: (NOTE) SARS-CoV-2 target nucleic acids are NOT DETECTED.  The SARS-CoV-2 RNA is generally detectable in upper respiratory specimens during the acute phase of infection. The lowest concentration of SARS-CoV-2 viral copies this assay can detect is 138 copies/mL. A negative result does not preclude SARS-Cov-2 infection and should not be used as the sole basis for treatment or other patient management decisions. A negative result may occur with   improper specimen collection/handling, submission of specimen other than nasopharyngeal swab, presence of viral mutation(s) within the areas targeted by this assay, and inadequate number of viral copies(<138 copies/mL). A negative result must be combined with clinical observations, patient history, and epidemiological information. The expected result is Negative.  Fact Sheet for Patients:  EntrepreneurPulse.com.au  Fact Sheet for Healthcare Providers:  IncredibleEmployment.be  This test is no t yet approved or cleared by the [place] FDA and  has been authorized for detection and/or diagnosis of SARS-CoV-2 by FDA under an Emergency Use Authorization (EUA). This EUA will remain  in effect (meaning this test can be used) for the duration of the COVID-19 declaration under Section 564(b)(1) of the Act, 21 U.S.C.section 360bbb-3(b)(1), unless the authorization is terminated  or revoked sooner.       Influenza A by PCR NEGATIVE NEGATIVE Final   Influenza B by PCR NEGATIVE NEGATIVE Final    Comment: (NOTE) The Xpert Xpress SARS-CoV-2/FLU/RSV plus assay is intended as an aid in the diagnosis of influenza from Nasopharyngeal swab specimens and should not be used as a sole basis for treatment. Nasal washings and aspirates are unacceptable for Xpert Xpress SARS-CoV-2/FLU/RSV testing.  Fact Sheet for Patients: EntrepreneurPulse.com.au  Fact Sheet for Healthcare Providers: IncredibleEmployment.be  This test is not yet approved or cleared by the [place] FDA and has been authorized for detection and/or diagnosis of SARS-CoV-2 by FDA under an Emergency Use Authorization (EUA). This EUA will remain in effect (meaning this test can be used) for the duration of the COVID-19 declaration under Section 564(b)(1) of the Act, 21 U.S.C. section 360bbb-3(b)(1), unless the authorization is terminated  or revoked.  Performed at [facility], [place] [address], [place], [place] [address]   MRSA Next Gen by PCR, Nasal     Status: None   Collection Time: 06/25/21  5:40 AM   Specimen: Nasal Mucosa; Nasal Swab  Result Value Ref Range Status   MRSA by PCR Next Gen NOT DETECTED NOT DETECTED Final    Comment: (NOTE) The GeneXpert MRSA Assay (FDA approved for NASAL specimens only), is one component of a comprehensive MRSA colonization surveillance program. It is not intended to diagnose MRSA infection nor to guide or monitor treatment for MRSA infections. Test performance is not FDA approved in patients less than 8 years old. Performed at [facility], [place] [address] [place], [place] [address]  Respiratory (~20 pathogens) panel by PCR     Status: None   Collection Time: 06/27/21  9:48 AM   Specimen: Nasopharyngeal Swab; Respiratory  Result Value Ref Range Status   Adenovirus NOT DETECTED NOT DETECTED Final   Coronavirus 229E NOT DETECTED NOT DETECTED Final    Comment: (NOTE) The Coronavirus on the Respiratory Panel, DOES NOT test for the novel  Coronavirus (2019 nCoV)    Coronavirus HKU1 NOT DETECTED NOT DETECTED Final   Coronavirus NL63 NOT DETECTED NOT DETECTED Final   Coronavirus OC43 NOT DETECTED NOT DETECTED Final   Metapneumovirus NOT DETECTED NOT DETECTED Final   Rhinovirus / Enterovirus NOT DETECTED NOT DETECTED Final   Influenza A NOT DETECTED NOT DETECTED Final   Influenza B NOT DETECTED NOT DETECTED Final   Parainfluenza Virus 1 NOT DETECTED NOT DETECTED Final   Parainfluenza Virus 2 NOT DETECTED NOT DETECTED Final   Parainfluenza Virus 3 NOT DETECTED NOT DETECTED Final   Parainfluenza Virus 4 NOT DETECTED NOT DETECTED Final   Respiratory Syncytial Virus NOT DETECTED NOT DETECTED Final   Bordetella pertussis NOT DETECTED NOT DETECTED Final   Bordetella Parapertussis NOT DETECTED NOT DETECTED Final   Chlamydophila pneumoniae NOT  DETECTED NOT DETECTED Final   Mycoplasma pneumoniae NOT DETECTED NOT DETECTED Final    Comment: Performed at [facility], [place]. [address], [place], [place] [address]          Radiology Studies: No results found.      Scheduled Meds:  Chlorhexidine Gluconate Cloth  6 each Topical Daily   feeding supplement  237 mL Oral BID BM   guaiFENesin  1,200 mg Oral BID   mouth rinse  15 mL Mouth Rinse BID   metoprolol tartrate  12.5 mg Oral BID   multivitamin with minerals  1 tablet Oral Daily   nystatin  5 mL Oral QID   pantoprazole (PROTONIX) IV  40 mg Intravenous Q12H   Continuous Infusions:  sodium chloride Stopped (06/27/21 0941)   ampicillin-sulbactam (UNASYN) IV 3 g (07/02/21 0810)     LOS: 7 days    Time spent: 35 minutes,     Epsie Walthall A Kaeleigh Westendorf, MD [facility]   If 7PM-7AM, please contact night-coverage [email or web address]  07/02/2021, 2:28 PM

## 2021-07-02 NOTE — Evaluation (Signed)
Physical Therapy Evaluation Patient Details Name: Mary Strickland MRN: [id number] DOB: 1930-09-05 Today's Date: 07/02/2021   History of Present Illness  87 y.o. female presents to [place] [facility] ED on 06/24/2021 with sudden onset hemoptysis. Pt experienced a bout of hypoxia in ED with AMS and was intubated on 7/23. Pt was transported to [facility] hospital on 7/24, found to have pneumomediastinum, pneumothorax. Chest tube inserted 7/24. Pt extubated on 7/25. PMH includes history of smoking, hypertension, mitral valve prolapse.  Clinical Impression  Pt presents to PT with deficits in activity tolerance, power, strength, balance, functional mobility, gait. Pt fatigues rapidly during session with limited activity. Pt requires physical assistance to transfer and perform bed mobility due to generalized weakness. Pt declines attempts at ambulation away form bedside due to exhaustion. Of note RN reports the pt ambulated to the bathroom earlier in the day with assistance. PT recommends SNF for short term rehab at discharge to improve activity tolerance and restore independence.    Follow Up Recommendations SNF    Equipment Recommendations  Rolling walker with 5" wheels;3in1 (PT)    Recommendations for Other Services       Precautions / Restrictions Precautions Precautions: Fall Precaution Comments: monitor SpO2 Restrictions Weight Bearing Restrictions: No      Mobility  Bed Mobility Overal bed mobility: Needs Assistance Bed Mobility: Supine to Sit;Sit to Supine     Supine to sit: Min assist Sit to supine: Min guard        Transfers Overall transfer level: Needs assistance Equipment used: 2 person hand held assist Transfers: Sit to/from Stand Sit to Stand: Min assist;+2 physical assistance         General transfer comment: pt pulls into  standing through PT/OT assist  Ambulation/Gait Ambulation/Gait assistance: Min assist;+2 physical assistance Gait Distance (Feet): 2  Feet Assistive device: 2 person hand held assist Gait Pattern/deviations: Step-to pattern Gait velocity: reduced Gait velocity interpretation: <1.31 ft/sec, indicative of household ambulator General Gait Details: pt side steps at edge of bed to left side  Stairs            Wheelchair Mobility    Modified Rankin (Stroke Patients Only)       Balance Overall balance assessment: Needs assistance Sitting-balance support: No upper extremity supported;Feet supported Sitting balance-[name] Scale: Good Sitting balance - Comments: supervision   Standing balance support: Single extremity supported;Bilateral upper extremity supported Standing balance-[name] Scale: Poor Standing balance comment: reliant on hand hold                             Pertinent Vitals/Pain Pain Assessment: No/denies pain    Home Living Family/patient expects to be discharged to:: Private residence Living Arrangements: Alone Available Help at Discharge:  (closest relative is in [place], no caregiver identified) Type of Home: House Home Access:  (unsure)     Home Layout: Laundry or work area in [place]: Environmental consultant - 2 wheels;Cane - single point;Shower seat      Prior Function Level of Independence: Independent with assistive device(s)         Comments: pt ambulates with cane     Hand Dominance        Extremity/Trunk Assessment   Upper Extremity Assessment Upper Extremity Assessment: Defer to OT evaluation    Lower Extremity Assessment Lower Extremity Assessment: Generalized weakness    Cervical / Trunk Assessment Cervical / Trunk Assessment: Normal  Communication   Communication: HOH  Cognition Arousal/Alertness:  Awake/alert Behavior During Therapy: WFL for tasks assessed/performed Overall Cognitive Status: Within Functional Limits for tasks assessed                                        General Comments General comments (skin integrity,  edema, etc.): pt on 5L [place] during session, fatigues rapidly with only brief periods of standing    Exercises     Assessment/Plan    PT Assessment Patient needs continued PT services  PT Problem List Decreased strength;Decreased balance;Decreased mobility;Decreased knowledge of precautions;Cardiopulmonary status limiting activity       PT Treatment Interventions DME instruction;Gait training;Stair training;Functional mobility training;Therapeutic activities;Therapeutic exercise;Balance training;Patient/family education    PT Goals (Current goals can be found in the Care Plan section)  Acute Rehab PT Goals Patient Stated Goal: to improve strength and eventually return home PT Goal Formulation: With patient Time For Goal Achievement: 07/16/21 Potential to Achieve Goals: Good    Frequency Min 2X/week   Barriers to discharge        Co-evaluation PT/OT/SLP Co-Evaluation/Treatment: Yes Reason for Co-Treatment: Complexity of the patient's impairments (multi-system involvement);To address functional/ADL transfers           AM-PAC PT "6 Clicks" Mobility  Outcome Measure Help needed turning from your back to your side while in a flat bed without using bedrails?: A Little Help needed moving from lying on your back to sitting on the side of a flat bed without using bedrails?: A Little Help needed moving to and from a bed to a chair (including a wheelchair)?: Total Help needed standing up from a chair using your arms (e.g., wheelchair or bedside chair)?: Total Help needed to walk in hospital room?: Total Help needed climbing 3-5 steps with a railing? : Total 6 Click Score: 10    End of Session Equipment Utilized During Treatment: Oxygen Activity Tolerance: Patient limited by fatigue Patient left: in bed;with call bell/phone within reach;with bed alarm set Nurse Communication: Mobility status PT Visit Diagnosis: Other abnormalities of gait and mobility (R26.89);Muscle weakness  (generalized) (M62.81)    Time: [id number] PT Time Calculation (min) (ACUTE ONLY): 24 min   Charges:   PT Evaluation $PT Eval Moderate Complexity: 1 Mod          Zenaida Niece, PT, DPT Acute Rehabilitation Pager: [phone] '  Zenaida Niece 07/02/2021, 2:38 PM

## 2021-07-02 NOTE — Progress Notes (Signed)
Palliative Medicine Inpatient Follow Up Note    Reason for consult:   Palliative Medicine Consult      Symptom Management Consult  Reason for Consult? Goals of care    HPI:  Per intake H&P --> 87 year old prior history of smoking, hypertension, mitral valve prolapse who was admitted with acute onset hemoptysis.  In the ED, she was initially observed to be hemodynamically stable with no acute findings seen on chest x-ray.  While in the ED she had a sudden drop of oxygen saturation to the 80s and demonstrated altered mental status/confusion, prompting endotracheal intubation on 06/24/2021, CTA obtained post intubation revealed pneumomediastinum and left pneumothorax.  Patient was admitted by CCM, she had a left-sided chest tube placed on 7/25.  She was subsequently extubated on 7/25th.  She has been off of IV pressors.  CTA chest was negative for PE, showed extensive pneumomediastinum throughout the chest extending into the base of the neck, question a degree of pneumopericardium as well.  Patient care was transferred to [facility] on 7/28.  CCM will continue to follow for chest tube management   Palliative care was asked to get involved to further discuss goals of care.   Today's Discussion (07/02/2021):  *Please note that this is a verbal dictation therefore any spelling or grammatical errors are due to the "[place] [employer]" system interpretation.  Chart reviewed. I met with Mary Strickland at bedside this morning. She was joined by her RN, Chemical engineer and her NT, Joellen Jersey. They were mobilizing her to the restroom. Alnisa got SOB and needed to sit down for a short period. Reviewed and confirmed the hope for improvements and placement in a skilled nursing home.  I wad able to message the [facility] team who will help moving forward with placement as per family request they would like [place] closer to [place].  PT/OT evaluations are still pending.  Questions and concerns addressed   Objective Assessment: Vital  Signs Vitals:   07/02/21 0900 07/02/21 1157  BP:  137/75  Pulse: 65 92  Resp: (!) 22 (!) 22  Temp:  99 F (37.2 C)  SpO2: 92% 97%    Intake/Output Summary (Last 24 hours) at 07/02/2021 1217 Last data filed at 07/01/2021 1500 Gross per 24 hour  Intake 200 ml  Output --  Net 200 ml   Last Weight  Most recent update: 07/02/2021  5:03 AM    Weight  81.3 kg (179 lb 3.7 oz)            Gen:  Elderly F in NAD HEENT: Oral thrush, dry mucous membranes CV: Irregular rate and rhythm PULM: On 4LPM [place] ABD: soft/nontender EXT: No edema Neuro: Alert and oriented x3 - Hard of hearing  SUMMARY OF RECOMMENDATIONS   DNAR/DNI   Obtained a copy of advance directives - will scan into [employer]   Appreciate medical social worker helping patient's granddaughter with potential placement options in the [place] area.  Patient does have the financial resources to afford ambulance transportation.   Appreciate PT/OT evaluations   Wean O2 as tolerated - 4LPM presently   Ongoing palliative care support  *Pls note patient is hard of hearing and a lip reader, pull mask down to communicate  Time Spent: 25 Greater than 50% of the time was spent in counseling and coordination of care ______________________________________________________________________________________ [place] Team Team Cell Phone: [phone] Please utilize secure chat with additional questions, if there is no response within 30 minutes please call the above  phone number  Palliative Medicine Team providers are available by phone from 7am to 7pm daily and can be reached through the team cell phone.  Should this patient require assistance outside of these hours, please call the patient's attending physician.

## 2021-07-02 NOTE — Progress Notes (Signed)
Occupational Therapy Evaluation Patient Details Name: Mary Strickland MRN: [id number] DOB: [birth year] Today's Date: 07/02/2021    History of Present Illness 87 y.o. female presents to [place] [facility] ED on 06/24/2021 with sudden onset hemoptysis. Pt experienced a bout of hypoxia in ED with AMS and was intubated on 7/23. Pt was transported to [facility] hospital on 7/24, found to have pneumomediastinum, pneumothorax. Chest tube inserted 7/24. Pt extubated on 7/25. PMH includes history of smoking, hypertension, mitral valve prolapse.   Clinical Impression   Mary Strickland was evaluated s/p the above impairments. PTA pt was indep in all ADL/IADLs, she lived alone and has family in [place], [place]. Upon evaluation pt was generally min A for all mobility including sit<>stand 2x. Pt declined further mobility for OOB ADLs due to fatigue. Pt is set up for upper body ADLs and min-modA for lower body ADLs. Pt fatigues quickly and required incrased time for all tasks. She benefits from continued OT acutely. Recommend d/c to SNF.     Follow Up Recommendations  SNF    Equipment Recommendations  Other (comment) (defer to next venue)       Precautions / Restrictions Precautions Precautions: Fall Precaution Comments: monitor SpO2 Restrictions Weight Bearing Restrictions: No      Mobility Bed Mobility Overal bed mobility: Needs Assistance Bed Mobility: Supine to Sit;Sit to Supine     Supine to sit: Min assist Sit to supine: Min guard        Transfers Overall transfer level: Needs assistance Equipment used: 2 person hand held assist Transfers: Sit to/from Stand Sit to Stand: Min assist;+2 physical assistance         General transfer comment: pt pulls into  standing through PT/OT assist    Balance Overall balance assessment: Needs assistance Sitting-balance support: No upper extremity supported;Feet supported Sitting balance-[name] Scale: Good Sitting balance - Comments: supervision   Standing balance  support: Single extremity supported;Bilateral upper extremity supported Standing balance-[name] Scale: Poor Standing balance comment: reliant on hand hold                           ADL either performed or assessed with clinical judgement   ADL Overall ADL's : Needs assistance/impaired Eating/Feeding: Independent;Sitting   Grooming: Set up;Sitting   Upper Body Bathing: Set up;Sitting;Supervision/ safety   Lower Body Bathing: Moderate assistance;Sit to/from stand   Upper Body Dressing : Set up;Sitting   Lower Body Dressing: Moderate assistance;Sit to/from stand Lower Body Dressing Details (indicate cue type and reason): pt able to don R sock adn required assistance with L Toilet Transfer: Minimal assistance;Ambulation;RW   Toileting- Clothing Manipulation and Hygiene: Minimal assistance;Sit to/from stand       Functional mobility during ADLs: Minimal assistance (sit<>stant 2x this session and lateral stepping along the bed) General ADL Comments: Pt able to bring foot towards figure four position for lower body ADLs, extremely fatigued, required incrased time and gentle encouragement      Pertinent Vitals/Pain Pain Assessment: No/denies pain     Hand Dominance Right   Extremity/Trunk Assessment Upper Extremity Assessment Upper Extremity Assessment: Overall WFL for tasks assessed;Generalized weakness   Lower Extremity Assessment Lower Extremity Assessment: Generalized weakness   Cervical / Trunk Assessment Cervical / Trunk Assessment: Normal   Communication Communication Communication: HOH (R ear is "good ear")   Cognition Arousal/Alertness: Awake/alert Behavior During Therapy: WFL for tasks assessed/performed Overall Cognitive Status: Within Functional Limits for tasks assessed  General Comments  pt on 5L [place] during session, fatigues rapidly with only brief periods of standing; noted bloody flem in kidney basin upon arrival     [place] expects to be discharged to:: Private residence Living Arrangements: Alone Available Help at Discharge:  (closest relative is in [place], no caregiver identified) Type of Home: House Home Access:  (unsure)     Home Layout: Laundry or work area in basement     Bathroom Shower/Tub: Tub/shower unit         [place]: Environmental consultant - 2 wheels;Cane - single point;Shower seat          Prior Functioning/Environment Level of Independence: Independent with assistive device(s)        Comments: pt ambulates with cane        OT Problem List: Decreased range of motion;Decreased strength;Decreased activity tolerance;Impaired balance (sitting and/or standing);Decreased safety awareness;Decreased knowledge of use of DME or AE;Decreased knowledge of precautions;Pain      OT Treatment/Interventions: Self-care/ADL training;Therapeutic exercise;DME and/or AE instruction;Therapeutic activities;Patient/family education;Balance training    OT Goals(Current goals can be found in the care plan section) Acute Rehab OT Goals Patient Stated Goal: to improve strength and eventually return home OT Goal Formulation: With patient Time For Goal Achievement: 07/16/21 Potential to Achieve Goals: Fair ADL Goals Pt Will Perform Grooming: with supervision;standing Pt Will Perform Lower Body Bathing: with set-up;sit to/from stand Pt Will Perform Lower Body Dressing: with set-up;sit to/from stand Pt Will Transfer to Toilet: with supervision;ambulating;grab bars Pt Will Perform Toileting - Clothing Manipulation and hygiene: Independently Additional ADL Goal #1: Pt will tolerate OOB functional acutivity for at least 5 minutes to progress indep towards ADLs  OT Frequency: Min 2X/week   Barriers to D/C: Decreased caregiver support  pt lives alone       Co-evaluation PT/OT/SLP Co-Evaluation/Treatment: Yes Reason for Co-Treatment: Complexity of the patient's impairments (multi-system  involvement);For patient/therapist safety;To address functional/ADL transfers   OT goals addressed during session: ADL's and self-care;Strengthening/ROM      AM-PAC OT "6 Clicks" Daily Activity     Outcome Measure Help from another person eating meals?: None Help from another person taking care of personal grooming?: A Little Help from another person toileting, which includes using toliet, bedpan, or urinal?: A Little Help from another person bathing (including washing, rinsing, drying)?: A Lot Help from another person to put on and taking off regular upper body clothing?: A Little Help from another person to put on and taking off regular lower body clothing?: A Lot 6 Click Score: 17   End of Session Equipment Utilized During Treatment: Oxygen Nurse Communication: Mobility status;Precautions;Weight bearing status  Activity Tolerance: Patient tolerated treatment well;Patient limited by fatigue Patient left: in bed;with call bell/phone within reach;with bed alarm set  OT Visit Diagnosis: Unsteadiness on feet (R26.81);Muscle weakness (generalized) (M62.81);Pain                Time: [id number] OT Time Calculation (min): 23 min Charges:  OT General Charges $OT Visit: 1 Visit OT Evaluation $OT Eval Moderate Complexity: 1 Mod   Naketa Daddario A Azarel Banner 07/02/2021, 3:32 PM

## 2021-07-03 DIAGNOSIS — J9601 Acute respiratory failure with hypoxia: Secondary | ICD-10-CM | POA: Diagnosis not present

## 2021-07-03 DIAGNOSIS — Z515 Encounter for palliative care: Secondary | ICD-10-CM | POA: Diagnosis not present

## 2021-07-03 LAB — QUANTIFERON-TB GOLD PLUS

## 2021-07-03 LAB — ANTINUCLEAR ANTIBODIES, IFA: ANA Ab, IFA: NEGATIVE

## 2021-07-03 LAB — BASIC METABOLIC PANEL
Anion gap: 7 (ref 5–15)
BUN: 27 mg/dL — ABNORMAL HIGH (ref 8–23)
CO2: 27 mmol/L (ref 22–32)
Calcium: 8.3 mg/dL — ABNORMAL LOW (ref 8.9–10.3)
Chloride: 100 mmol/L (ref 98–111)
Creatinine, Ser: 0.82 mg/dL (ref 0.44–1.00)
GFR, Estimated: >60 mL/min (ref >60)
Glucose, Bld: 86 mg/dL (ref 70–99)
Potassium: 3.3 mmol/L — ABNORMAL LOW (ref 3.5–5.1)
Sodium: 134 mmol/L — ABNORMAL LOW (ref 135–145)

## 2021-07-03 LAB — CBC
HCT: 24 % — ABNORMAL LOW (ref 36.0–46.0)
Hemoglobin: 8.1 g/dL — ABNORMAL LOW (ref 12.0–15.0)
MCH: 30.3 pg (ref 26.0–34.0)
MCHC: 33.8 g/dL (ref 30.0–36.0)
MCV: 89.9 fL (ref 80.0–100.0)
Platelets: 263 10*3/uL (ref 150–400)
RBC: 2.67 MIL/uL — ABNORMAL LOW (ref 3.87–5.11)
RDW: 14.4 % (ref 11.5–15.5)
WBC: 15.6 10*3/uL — ABNORMAL HIGH (ref 4.0–10.5)
nRBC: 0 % (ref 0.0–0.2)

## 2021-07-03 MED ORDER — PANTOPRAZOLE SODIUM 40 MG PO TBEC
40.0000 mg | DELAYED_RELEASE_TABLET | Freq: Two times a day (BID) | ORAL | Status: DC
  Administered 2021-07-03 – 2021-08-03 (×62): 40 mg via ORAL
  Filled 2021-07-03 (×62): qty 1

## 2021-07-03 MED ORDER — POTASSIUM CHLORIDE CRYS ER 20 MEQ PO TBCR
40.0000 meq | EXTENDED_RELEASE_TABLET | Freq: Once | ORAL | Status: AC
  Administered 2021-07-03: 40 meq via ORAL
  Filled 2021-07-03: qty 2

## 2021-07-03 NOTE — NC FL2 (Signed)
[employer] LEVEL OF CARE SCREENING TOOL     IDENTIFICATION  Patient Name: Mary Strickland Birthdate: 04-18-1930 Sex: female Admission Date (Current Location): 06/24/2021  [facility] and [place] Number:  Herbalist and Address:  [facility], [place] [address] [place], [place] [address]      Provider Number: [id number]  Attending Physician Name and Address:  Elmarie Shiley, MD  Relative Name and Phone Number:       Current Level of Care: Hospital Recommended Level of Care: [place] Prior Approval Number:    Date Approved/Denied:   PASRR Number: [id number]  Discharge Plan: SNF    Current Diagnoses: Patient Active Problem List   Diagnosis Date Noted   Hemoptysis 06/25/2021   Acute hypoxemic respiratory failure ([place]) 06/25/2021   Abnormal CXR 06/25/2021   Leukocytosis 06/25/2021   Subcutaneous emphysema ([place]) 06/25/2021   Pneumoperitoneum of unknown etiology 06/25/2021   Pneumothorax on right 06/25/2021   Hypertension 12/18/2011   Dyslipidemia    Palpitations    GERD (gastroesophageal reflux disease)    Neuroma, Morton's    Back pain    Right carotid bruit     Orientation RESPIRATION BLADDER Height & Weight     Self, Time, Situation, Place  O2 Continent Weight: 184 lb 1.4 oz (83.5 kg) Height:  '5\' 2"'$  (157.5 cm)  BEHAVIORAL SYMPTOMS/MOOD NEUROLOGICAL BOWEL NUTRITION STATUS      Continent Diet (please see discharge summary)  AMBULATORY STATUS COMMUNICATION OF NEEDS Skin   Supervision Verbally Normal                       Personal Care Assistance Level of Assistance  Bathing, Feeding, Dressing Bathing Assistance: Limited assistance Feeding assistance: Independent Dressing Assistance: Limited assistance     Functional Limitations Info  Sight, Hearing, Speech Sight Info: Adequate Hearing Info: Impaired      SPECIAL CARE FACTORS FREQUENCY  PT (By licensed PT), OT (By licensed OT)      PT Frequency: 5x per week OT Frequency: 5x per week            Contractures Contractures Info: Not present    Additional Factors Info  Code Status, Allergies Code Status Info: DNR Allergies Info: Sulfamethoxazole,Percocet ,Zocor           Current Medications (07/03/2021):  This is the current hospital active medication list Current Facility-Administered Medications  Medication Dose Route Frequency Provider Last Rate Last Admin   0.9 %  sodium chloride infusion   Intravenous PRN Olalere, Adewale A, MD   Stopped at 06/27/21 0941   acetaminophen (TYLENOL) suppository 650 mg  650 mg Rectal Q4H PRN Olalere, Adewale A, MD   650 mg at 06/26/21 1401   Ampicillin-Sulbactam (UNASYN) 3 g in sodium chloride 0.9 % 100 mL IVPB  3 g Intravenous Q8H Regalado, Belkys A, MD 200 mL/hr at 07/03/21 1322 3 g at 07/03/21 1322   Chlorhexidine Gluconate Cloth 2 % PADS 6 each  6 each Topical Daily Renee Pain, MD   6 each at 07/03/21 0830   feeding supplement (ENSURE ENLIVE / ENSURE PLUS) liquid 237 mL  237 mL Oral BID BM Margaretha Seeds, MD   237 mL at 07/02/21 1427   fentaNYL (SUBLIMAZE) injection 25 mcg  25 mcg Intravenous Q2H PRN Regalado, Belkys A, MD       guaiFENesin (MUCINEX) 12 hr tablet 1,200 mg  1,200 mg Oral BID Regalado, Belkys A,  MD   1,200 mg at 07/03/21 0951   ipratropium-albuterol (DUONEB) 0.5-2.5 (3) MG/3ML nebulizer solution 3 mL  3 mL Inhalation Q6H PRN Margaretha Seeds, MD       MEDLINE mouth rinse  15 mL Mouth Rinse BID Olalere, Adewale A, MD   15 mL at 07/03/21 0951   metoprolol tartrate (LOPRESSOR) tablet 12.5 mg  12.5 mg Oral BID Fay Records, MD   12.5 mg at 07/03/21 [id number]   multivitamin with minerals tablet 1 tablet  1 tablet Oral Daily Brand Males, MD   1 tablet at 07/03/21 0951   nystatin (MYCOSTATIN) 100000 UNIT/ML suspension 500,000 Units  5 mL Oral QID Rosezella Rumpf, NP   500,000 Units at 07/03/21 1322   ondansetron (ZOFRAN) injection 4 mg  4 mg  Intravenous Q6H PRN Renee Pain, MD   4 mg at 06/27/21 1208   pantoprazole (PROTONIX) EC tablet 40 mg  40 mg Oral BID Onnie Boer Q, RPH-CPP         Discharge Medications: Please see discharge summary for a list of discharge medications.  Relevant Imaging Results:  Relevant Lab Results:   Additional Information SSN [id number]  Vinie Sill, LCSW

## 2021-07-03 NOTE — Progress Notes (Signed)
PHARMACIST - PHYSICIAN COMMUNICATION  DR:   Tyrell Antonio  CONCERNING: IV to Oral Route Change Policy  RECOMMENDATION: This patient is receiving Protonix by the intravenous route.  Based on criteria approved by the Pharmacy and Therapeutics Committee, the intravenous medication(s) is/are being converted to the equivalent oral dose form(s).   DESCRIPTION: These criteria include: The patient is eating (either orally or via tube) and/or has been taking other orally administered medications for a least 24 hours The patient has no evidence of active gastrointestinal bleeding or impaired GI absorption (gastrectomy, short bowel, patient on TNA or NPO).  If you have questions about this conversion, please contact the Pharmacy Department  '[]'$   [phone] )  Deniese Aubry '[]'$   [phone] )  [facility] '[x]'$   [phone] )  [facility] '[]'$   [phone] )  [facility] '[]'$   [phone] )  [facility]

## 2021-07-03 NOTE — Progress Notes (Signed)
PROGRESS NOTE    Mary Strickland  [id number] DOB: 1930/04/16 DOA: 06/24/2021 PCP: Burnard Bunting, MD   Brief Narrative: 87 year old prior history of smoking, hypertension, mitral valve prolapse who was admitted with acute onset hemoptysis.  In the ED, she was initially observed to be hemodynamically stable with no acute findings seen on chest x-ray.  While in the ED she had a sudden drop of oxygen saturation to the 80s and demonstrated altered mental status/confusion, prompting endotracheal intubation on 06/24/2021, CTA obtained post intubation revealed pneumomediastinum and left pneumothorax.  Patient was admitted by CCM, she had a left-sided chest tube placed on 7/25.  She was subsequently extubated on 7/25th.  She has been off of IV pressors.  CTA chest was negative for PE, showed extensive pneumomediastinum throughout the chest extending into the base of the neck, question a degree of pneumopericardium as well.  Patient care was transferred to [facility] on 7/28.  CCM will continue to follow for chest tube management   Assessment & Plan:   Principal Problem:   Acute hypoxemic respiratory failure (HCC) Active Problems:   Hypertension   Hemoptysis   Abnormal CXR   Leukocytosis   Subcutaneous emphysema (HCC)   Pneumoperitoneum of unknown etiology   Pneumothorax on right  1-Acute Respiratory Failure in the setting of CAP and bilateral pulmonary infiltrates, left pneumothorax with pneumomediastinum: -Hemoptysis initial concern was for alveolar hemorrhage versus hematemesis.  No further episode of hemoptysis since admission. -Patient was intubated on arrival 7/23 s/p extubation on 7/25. -Patient had left side chest tube placement 7/25. -Chest x-ray 7/28 two-point stable position. -Continue with Unasyn. 9/10. -Flutter valve and continue with Mucinex.  -CCM following. Patient develop hemoptysis again and worsening hypoxemia. CCM discussed with family plan is for more comfort care  approach. Palliative care consulted. Plan for conservative measure of care. Plan for SNF for rehab.  -Chest tube removed 7/28 -Follow WBC trend. Trending down.  Currently on 4 L oxygen.  Monitor leukocytosis.   2-Hypophosphatemia: Replaced.   3-Elevation of troponin: Troponin continued to increase.  Patient denies chest pain.  Plan to repeat EKG.  Plan to repeat limited echo.  Cardiology has been consulted. Elevation of troponin likely related to Respiratory failure Pneumothorax and pneumomediastinum, demand ischemia from CAD.  Started low dose metoprolol/  ECHO 7/25: Fraction 60 to 65%.  Left ventricular function is normal.  Right ventricular function is normal.  4-malpositioned left IJ CVL: CTA neck 7/27 no acute or focal vascular injury or pseudoaneurysmal Vascular was initially consulted and they removed line.   5-Hypertension: Continue to hold Diovan and Norvasc, home medication. Continue with as needed hydralazine.  Transaminases: Possible related to hypovolemia.  Follow trend  Anemia: follow hb Mild Hyponatremia; monitor.  Dizziness; resolved.  Monitor.  Hypokalemia; replete orally.   Nutrition Problem: Inadequate oral intake Etiology: inability to eat    Signs/Symptoms: NPO status    Interventions: MVI, Ensure Enlive (each supplement provides 350kcal and 20 grams of protein), Other (Comment) (downgrade diet)  Estimated body mass index is 33.67 kg/m as calculated from the following:   Height as of this encounter: '5\' 2"'$  (1.575 m).   Weight as of this encounter: 83.5 kg.   DVT prophylaxis: Not anticoagulation due to initial presentation of hemoptysis Code Status: DNR Family Communication: care disucussed with grandaughter 7/31   Disposition Plan:  Status is: Inpatient  Remains inpatient appropriate because:Hemodynamically unstable  Dispo: The patient is from: Home  Anticipated d/c is to:  Be determined              Patient currently is  medically stable to d/c.   Difficult to place patient No        Consultants:  CCM admitted patient Cardiology Vascular surgery  Procedures:  Echo: Normal ejection fraction  Antimicrobials:  Unasyn  Subjective: Cough improved.   Objective: Vitals:   07/03/21 0420 07/03/21 0425 07/03/21 0844 07/03/21 1312  BP: (!) 115/57  (!) 142/61 130/64  Pulse: 83 83 95 79  Resp: '14 20 20 20  '$ Temp: 98.8 F (37.1 C)  97.6 F (36.4 C) 99.1 F (37.3 C)  TempSrc: Oral  Oral Oral  SpO2: 95% 95% 95% 97%  Weight:  83.5 kg    Height:        Intake/Output Summary (Last 24 hours) at 07/03/2021 1708 Last data filed at 07/03/2021 1606 Gross per 24 hour  Intake 960 ml  Output 1100 ml  Net -140 ml    Filed Weights   07/01/21 0331 07/02/21 0500 07/03/21 0425  Weight: 79.1 kg 81.3 kg 83.5 kg    Examination:  General exam: NAD Respiratory system: BL ronchus Cardiovascular system: S 1, S 2 RRR Gastrointestinal system: BS present, soft, nt Central nervous system: Alert Extremities: No edema    Data Reviewed: I have personally reviewed following labs and imaging studies  CBC: Recent Labs  Lab 06/27/21 0754 06/28/21 0513 06/29/21 0020 06/30/21 0050 07/03/21 0046  WBC 19.7* 15.8* 14.8* 12.7* 15.6*  NEUTROABS 16.5*  --   --   --   --   HGB 10.0* 9.2* 9.2* 8.5* 8.1*  HCT 29.4* 26.1* 26.3* 24.6* 24.0*  MCV 91.0 89.7 88.9 89.1 89.9  PLT 153 168 178 187 [id number]    Basic Metabolic Panel: Recent Labs  Lab 06/27/21 2157 06/28/21 0308 06/29/21 0020 06/30/21 0050 07/01/21 0058 07/03/21 0046  NA 134* 136 132* 133*  --  134*  K 3.4* 3.9 3.7 3.7  --  3.3*  CL 103 103 102 99  --  100  CO2 21* '22 23 26  '$ --  27  GLUCOSE 138* 154* 113* 87  --  86  BUN 27* 31* 33* 29*  --  27*  CREATININE 1.06* 1.07* 0.90 0.89  --  0.82  CALCIUM 8.1* 8.4* 8.6* 8.6*  --  8.3*  MG 2.1 2.1  --   --   --   --   PHOS  --  2.6 2.1* 3.3 2.9  --     GFR: Estimated Creatinine Clearance: 47.5 mL/min (by  C-G formula based on SCr of 0.82 mg/dL). Liver Function Tests: Recent Labs  Lab 06/28/21 0308  AST 64*  ALT 29  ALKPHOS 31*  BILITOT 1.0  PROT 5.8*  ALBUMIN 2.6*    No results for input(s): LIPASE, AMYLASE in the last 168 hours. No results for input(s): AMMONIA in the last 168 hours. Coagulation Profile: No results for input(s): INR, PROTIME in the last 168 hours.  Cardiac Enzymes: No results for input(s): CKTOTAL, CKMB, CKMBINDEX, TROPONINI in the last 168 hours. BNP (last 3 results) No results for input(s): PROBNP in the last 8760 hours. HbA1C: No results for input(s): HGBA1C in the last 72 hours. CBG: Recent Labs  Lab 06/28/21 0005 06/28/21 0723 06/28/21 1136 06/28/21 1512 06/29/21 0855  GLUCAP 131* 125* 112* 148* 106*    Lipid Profile: No results for input(s): CHOL, HDL, LDLCALC, TRIG, CHOLHDL, LDLDIRECT in the last 72  hours. Thyroid Function Tests: No results for input(s): TSH, T4TOTAL, FREET4, T3FREE, THYROIDAB in the last 72 hours. Anemia Panel: No results for input(s): VITAMINB12, FOLATE, FERRITIN, TIBC, IRON, RETICCTPCT in the last 72 hours. Sepsis Labs: Recent Labs  Lab 06/27/21 0948 06/28/21 0308 06/29/21 0020  PROCALCITON 0.70 1.35 0.95  LATICACIDVEN 1.0  --   --      Recent Results (from the past 240 hour(s))  Resp Panel by RT-PCR (Flu A&B, Covid) Nasopharyngeal Swab     Status: None   Collection Time: 06/24/21 11:04 PM   Specimen: Nasopharyngeal Swab; Nasopharyngeal(NP) swabs in vial transport medium  Result Value Ref Range Status   SARS Coronavirus 2 by RT PCR NEGATIVE NEGATIVE Final    Comment: (NOTE) SARS-CoV-2 target nucleic acids are NOT DETECTED.  The SARS-CoV-2 RNA is generally detectable in upper respiratory specimens during the acute phase of infection. The lowest concentration of SARS-CoV-2 viral copies this assay can detect is 138 copies/mL. A negative result does not preclude SARS-Cov-2 infection and should not be used as the  sole basis for treatment or other patient management decisions. A negative result may occur with  improper specimen collection/handling, submission of specimen other than nasopharyngeal swab, presence of viral mutation(s) within the areas targeted by this assay, and inadequate number of viral copies(<138 copies/mL). A negative result must be combined with clinical observations, patient history, and epidemiological information. The expected result is Negative.  Fact Sheet for Patients:  EntrepreneurPulse.com.au  Fact Sheet for Healthcare Providers:  IncredibleEmployment.be  This test is no t yet approved or cleared by the [place] FDA and  has been authorized for detection and/or diagnosis of SARS-CoV-2 by FDA under an Emergency Use Authorization (EUA). This EUA will remain  in effect (meaning this test can be used) for the duration of the COVID-19 declaration under Section 564(b)(1) of the Act, 21 U.S.C.section 360bbb-3(b)(1), unless the authorization is terminated  or revoked sooner.       Influenza A by PCR NEGATIVE NEGATIVE Final   Influenza B by PCR NEGATIVE NEGATIVE Final    Comment: (NOTE) The Xpert Xpress SARS-CoV-2/FLU/RSV plus assay is intended as an aid in the diagnosis of influenza from Nasopharyngeal swab specimens and should not be used as a sole basis for treatment. Nasal washings and aspirates are unacceptable for Xpert Xpress SARS-CoV-2/FLU/RSV testing.  Fact Sheet for Patients: EntrepreneurPulse.com.au  Fact Sheet for Healthcare Providers: IncredibleEmployment.be  This test is not yet approved or cleared by the [place] FDA and has been authorized for detection and/or diagnosis of SARS-CoV-2 by FDA under an Emergency Use Authorization (EUA). This EUA will remain in effect (meaning this test can be used) for the duration of the COVID-19 declaration under Section 564(b)(1) of the  Act, 21 U.S.C. section 360bbb-3(b)(1), unless the authorization is terminated or revoked.  Performed at [facility], [place] [address], [place], [place] [address]   MRSA Next Gen by PCR, Nasal     Status: None   Collection Time: 06/25/21  5:40 AM   Specimen: Nasal Mucosa; Nasal Swab  Result Value Ref Range Status   MRSA by PCR Next Gen NOT DETECTED NOT DETECTED Final    Comment: (NOTE) The GeneXpert MRSA Assay (FDA approved for NASAL specimens only), is one component of a comprehensive MRSA colonization surveillance program. It is not intended to diagnose MRSA infection nor to guide or monitor treatment for MRSA infections. Test performance is not FDA approved in patients less than 36 years old. Performed  at [facility], [place] [address] [place], [place] [address]   Respiratory (~20 pathogens) panel by PCR     Status: None   Collection Time: 06/27/21  9:48 AM   Specimen: Nasopharyngeal Swab; Respiratory  Result Value Ref Range Status   Adenovirus NOT DETECTED NOT DETECTED Final   Coronavirus 229E NOT DETECTED NOT DETECTED Final    Comment: (NOTE) The Coronavirus on the Respiratory Panel, DOES NOT test for the novel  Coronavirus (2019 nCoV)    Coronavirus HKU1 NOT DETECTED NOT DETECTED Final   Coronavirus NL63 NOT DETECTED NOT DETECTED Final   Coronavirus OC43 NOT DETECTED NOT DETECTED Final   Metapneumovirus NOT DETECTED NOT DETECTED Final   Rhinovirus / Enterovirus NOT DETECTED NOT DETECTED Final   Influenza A NOT DETECTED NOT DETECTED Final   Influenza B NOT DETECTED NOT DETECTED Final   Parainfluenza Virus 1 NOT DETECTED NOT DETECTED Final   Parainfluenza Virus 2 NOT DETECTED NOT DETECTED Final   Parainfluenza Virus 3 NOT DETECTED NOT DETECTED Final   Parainfluenza Virus 4 NOT DETECTED NOT DETECTED Final   Respiratory Syncytial Virus NOT DETECTED NOT DETECTED Final   Bordetella pertussis NOT DETECTED NOT DETECTED Final   Bordetella  Parapertussis NOT DETECTED NOT DETECTED Final   Chlamydophila pneumoniae NOT DETECTED NOT DETECTED Final   Mycoplasma pneumoniae NOT DETECTED NOT DETECTED Final    Comment: Performed at [facility], [place]. [address], [place], [place] [address]          Radiology Studies: No results found.      Scheduled Meds:  Chlorhexidine Gluconate Cloth  6 each Topical Daily   feeding supplement  237 mL Oral BID BM   guaiFENesin  1,200 mg Oral BID   mouth rinse  15 mL Mouth Rinse BID   metoprolol tartrate  12.5 mg Oral BID   multivitamin with minerals  1 tablet Oral Daily   nystatin  5 mL Oral QID   pantoprazole  40 mg Oral BID   Continuous Infusions:  sodium chloride Stopped (06/27/21 0941)   ampicillin-sulbactam (UNASYN) IV 3 g (07/03/21 1322)     LOS: 8 days    Time spent: 35 minutes,     Maecyn Panning A Keaghan Bowens, MD [facility]   If 7PM-7AM, please contact night-coverage [email or web address]  07/03/2021, 5:08 PM

## 2021-07-03 NOTE — TOC Initial Note (Signed)
Transition of Care [facility]) - Initial/Assessment Note    Patient Details  Name: Mary Strickland MRN: [id number] Date of Birth: 1930-01-15  Transition of Care [facility]) CM/SW Contact:    Vinie Sill, LCSW Phone Number: 07/03/2021, 4:46 PM  Clinical Narrative:                  CSW met with patient at bedside. CSW introduced self and explained role. Patient states she lives home alone. CSW explained therapy recommendations of short term rehab at [facility]. Patient is agreeable to rehab at [facility]. CSW explained the SNF process. CSW explained limited SNF options w/ [employer] and other placement challenges. She states " I want to do what is best". CSW was given permission to call her granddaughter,[name].   CSW spoke with [name]. She is agreeable to rehab at [facility] and has inquired about [facility] in [place] and [facility] in [place].. CSW explained family will be responsible for cost of transport to facility if accepted. She states understanding. CSW advised will call facilities tomorrow to confirm if facilities are in network with [facility], if so then CSW will follow up on cost of transport. As back up plan, CSW was given permission to send referrals to SNF in [place] area too.   CSW will continue to follow and assist with discharge planning.  Thurmond Butts, MSW, LCSW Clinical Social Worker     Expected Discharge Plan: Home/Self Care Barriers to Discharge: Continued Medical Work up   Patient Goals and CMS Choice Patient states their goals for this hospitalization and ongoing recovery are:: intubated CMS Medicare.gov Compare Post Acute Care list provided to:: Patient    Expected Discharge Plan and Services Expected Discharge Plan: Home/Self Care In-house Referral: Clinical Social Work     Living arrangements for the past 2 months: Single Family Home                                      Prior Living Arrangements/Services Living arrangements for the  past 2 months: Single Family Home Lives with:: Self Patient language and need for interpreter reviewed:: No        Need for Family Participation in Patient Care: Yes (Comment) Care giver support system in place?: Yes (comment)   Criminal Activity/Legal Involvement Pertinent to Current Situation/Hospitalization: No - Comment as needed  Activities of Daily Living Home Assistive Devices/Equipment: Eyeglasses, Cane (specify quad or straight) ADL Screening (condition at time of admission) Patient's cognitive ability adequate to safely complete daily activities?: No Is the patient deaf or have difficulty hearing?: Yes Does the patient have difficulty seeing, even when wearing glasses/contacts?: No Does the patient have difficulty concentrating, remembering, or making decisions?: Yes Patient able to express need for assistance with ADLs?: Yes Does the patient have difficulty dressing or bathing?: Yes Independently performs ADLs?: No Communication: Independent Dressing (OT): Needs assistance Is this a change from baseline?: Pre-admission baseline Grooming: Independent Feeding: Independent Bathing: Needs assistance Is this a change from baseline?: Pre-admission baseline Toileting: Needs assistance Is this a change from baseline?: Pre-admission baseline In/Out Bed: Needs assistance Is this a change from baseline?: Pre-admission baseline Walks in Home: Needs assistance Is this a change from baseline?: Pre-admission baseline Does the patient have difficulty walking or climbing stairs?: Yes Weakness of Legs: Both Weakness of Arms/Hands: Both  Permission Sought/Granted Permission sought to share information with : Family Supports Permission granted to  share information with : Yes, Verbal Permission Granted  Share Information with NAME: Candida Peeling  Permission granted to share info w AGENCY: SNFs  Permission granted to share info w Relationship: granddaughter  Permission granted to share  info w Contact Information: [phone]  Emotional Assessment   Attitude/Demeanor/Rapport: Engaged Affect (typically observed): Accepting, Pleasant, Appropriate Orientation: : Oriented to Self, Oriented to Place, Oriented to  Time, Oriented to Situation Alcohol / Substance Use: Not Applicable Psych Involvement: No (comment)  Admission diagnosis:  Acute respiratory distress [R06.03] Encounter for imaging study to confirm orogastric (OG) tube placement [Z01.89] Hemoptysis [R04.2] Acute hypoxemic respiratory failure ([place]) [J96.01] Patient Active Problem List   Diagnosis Date Noted   Hemoptysis 06/25/2021   Acute hypoxemic respiratory failure ([place]) 06/25/2021   Abnormal CXR 06/25/2021   Leukocytosis 06/25/2021   Subcutaneous emphysema ([place]) 06/25/2021   Pneumoperitoneum of unknown etiology 06/25/2021   Pneumothorax on right 06/25/2021   Hypertension 12/18/2011   Dyslipidemia    Palpitations    GERD (gastroesophageal reflux disease)    Neuroma, Morton's    Back pain    Right carotid bruit    PCP:  Burnard Bunting, MD Pharm[id number] [place] [address]: [phone]::3[facility] (Now [facility]) - [place]Phone: [phone]: [phone] [place] [place] [address]Phone: [phone]: [phone] [facility] [place] Registered [facility] Sites [id number]: [phone]: [phone]    Social Determinants of Health (SDOH) Interventions    Readmission Risk Interventions No flowsheet data found.

## 2021-07-03 NOTE — Social Work (Signed)
Received consult for SNF placement- called patient's granddaughter,[name], left voice message to return call.  Thurmond Butts, MSW, LCSW Clinical Social Worker

## 2021-07-03 NOTE — Care Management Important Message (Signed)
Important Message  Patient Details  Name: Mary Strickland MRN: [id number] Date of Birth: 1930-11-07   Medicare Important Message Given:  Yes     [name] 07/03/2021, 10:05 AM

## 2021-07-03 NOTE — Progress Notes (Signed)
Palliative Medicine Inpatient Follow Up Note    Reason for consult:   Palliative Medicine Consult      Symptom Management Consult  Reason for Consult? Goals of care    HPI:  Per intake H&P --> 87 year old prior history of smoking, hypertension, mitral valve prolapse who was admitted with acute onset hemoptysis.  In the ED, she was initially observed to be hemodynamically stable with no acute findings seen on chest x-ray.  While in the ED she had a sudden drop of oxygen saturation to the 80s and demonstrated altered mental status/confusion, prompting endotracheal intubation on 06/24/2021, CTA obtained post intubation revealed pneumomediastinum and left pneumothorax.  Patient was admitted by CCM, she had a left-sided chest tube placed on 7/25.  She was subsequently extubated on 7/25th.  She has been off of IV pressors.  CTA chest was negative for PE, showed extensive pneumomediastinum throughout the chest extending into the base of the neck, question a degree of pneumopericardium as well.  Patient care was transferred to [facility] on 7/28.  CCM will continue to follow for chest tube management   Palliative care was asked to get involved to further discuss goals of care.   Today's Discussion (07/03/2021):  *Please note that this is a verbal dictation therefore any spelling or grammatical errors are due to the "[place]" system interpretation.  Chart reviewed. Worsening leukocytosis today. On 4-5LPM [place] in the last 24 hours.  PT/OT evaluations completed - SNF recommended.  [place] is in no distress on bedside assessment. On 4LPM of [place].   Corresponded via e-mail with patients granddaughter Hal Hope, again she reiterated the hope for [place] to transition to a location closer to family outside of the [place] vicinity. Reviewed that this information will be passed along to the [facility] team.  Questions and concerns addressed   Objective Assessment: Vital Signs Vitals:   07/03/21 0420 07/03/21 0425   BP: (!) 115/57   Pulse: 83 83  Resp: 14 20  Temp: 98.8 F (37.1 C)   SpO2: 95% 95%    Intake/Output Summary (Last 24 hours) at 07/03/2021 [id number] Last data filed at 07/03/2021 [id number] Gross per 24 hour  Intake 400 ml  Output 800 ml  Net -400 ml    Last Weight  Most recent update: 07/03/2021  4:26 AM    Weight  83.5 kg (184 lb 1.4 oz)            Gen:  Elderly F in NAD HEENT: Oral thrush, dry mucous membranes CV: Irregular rate and rhythm PULM: On 4LPM [place] ABD: soft/nontender EXT: No edema Neuro: Alert and oriented x3 - Hard of hearing  SUMMARY OF RECOMMENDATIONS   DNAR/DNI    Advance directives obtained   Appreciate medical social worker helping patient's granddaughter with potential placement options in the [place] area. Preferences: 1) [facility] [phone] 2) [facility] [phone] [facility] [phone]   Patient does have the financial resources to afford ambulance transportation.   Appreciate PT/OT assessments - SNF recommendation   Wean O2 as tolerated - 4LPM presently  Oral thrush: continue oral care BID and nystatin qid  Goals: For improvements and optimization  Ongoing incremental palliative care support  *Pls note patient is hard of hearing and a lip reader, pull mask down to communicate  Time Spent: 25 Greater than 50% of the time was spent in counseling and coordination of care ______________________________________________________________________________________ [place] Team  Team Cell Phone: [phone] Please utilize secure chat with additional questions, if there is no response within 30 minutes please call the above phone number  Palliative Medicine Team providers are available by phone from 7am to 7pm daily and can be reached through the team cell phone.  Should this patient require assistance outside of these hours, please call the  patient's attending physician.

## 2021-07-04 DIAGNOSIS — Z515 Encounter for palliative care: Secondary | ICD-10-CM | POA: Diagnosis not present

## 2021-07-04 DIAGNOSIS — J9601 Acute respiratory failure with hypoxia: Secondary | ICD-10-CM | POA: Diagnosis not present

## 2021-07-04 LAB — BASIC METABOLIC PANEL
Anion gap: 10 (ref 5–15)
BUN: 26 mg/dL — ABNORMAL HIGH (ref 8–23)
CO2: 25 mmol/L (ref 22–32)
Calcium: 8.5 mg/dL — ABNORMAL LOW (ref 8.9–10.3)
Chloride: 99 mmol/L (ref 98–111)
Creatinine, Ser: 0.92 mg/dL (ref 0.44–1.00)
GFR, Estimated: 60 mL/min — ABNORMAL LOW (ref >60)
Glucose, Bld: 90 mg/dL (ref 70–99)
Potassium: 3.3 mmol/L — ABNORMAL LOW (ref 3.5–5.1)
Sodium: 134 mmol/L — ABNORMAL LOW (ref 135–145)

## 2021-07-04 LAB — CBC
HCT: 25.5 % — ABNORMAL LOW (ref 36.0–46.0)
Hemoglobin: 8.5 g/dL — ABNORMAL LOW (ref 12.0–15.0)
MCH: 30 pg (ref 26.0–34.0)
MCHC: 33.3 g/dL (ref 30.0–36.0)
MCV: 90.1 fL (ref 80.0–100.0)
Platelets: 336 10*3/uL (ref 150–400)
RBC: 2.83 MIL/uL — ABNORMAL LOW (ref 3.87–5.11)
RDW: 14.6 % (ref 11.5–15.5)
WBC: 13.1 10*3/uL — ABNORMAL HIGH (ref 4.0–10.5)
nRBC: 0 % (ref 0.0–0.2)

## 2021-07-04 MED ORDER — ENSURE ENLIVE PO LIQD
237.0000 mL | Freq: Three times a day (TID) | ORAL | Status: DC
  Administered 2021-07-04 – 2021-08-03 (×69): 237 mL via ORAL

## 2021-07-04 MED ORDER — MELATONIN 3 MG PO TABS
3.0000 mg | ORAL_TABLET | Freq: Every evening | ORAL | Status: DC | PRN
  Administered 2021-07-04 – 2021-08-02 (×24): 3 mg via ORAL
  Filled 2021-07-04 (×25): qty 1

## 2021-07-04 MED ORDER — POTASSIUM CHLORIDE CRYS ER 20 MEQ PO TBCR
40.0000 meq | EXTENDED_RELEASE_TABLET | Freq: Once | ORAL | Status: AC
  Administered 2021-07-04: 40 meq via ORAL
  Filled 2021-07-04: qty 2

## 2021-07-04 MED ORDER — LIP MEDEX EX OINT
TOPICAL_OINTMENT | CUTANEOUS | Status: DC | PRN
  Administered 2021-07-15: 1 via TOPICAL
  Filled 2021-07-04 (×3): qty 7

## 2021-07-04 NOTE — Progress Notes (Signed)
This chaplain responded to PMT consult for spiritual care.  The chaplain understands the Pt. is participating in a medical procedure at the time of the chaplain's visit.  The chaplain will make plans to revisit.

## 2021-07-04 NOTE — Progress Notes (Signed)
 Palliative Medicine Inpatient Follow Up Note    Reason for consult:   Palliative Medicine Consult      Symptom Management Consult  Reason for Consult? Goals of care    HPI:  Per intake H&P --> 87-year-old prior history of smoking, hypertension, mitral valve prolapse who was admitted with acute onset hemoptysis.  In the ED, she was initially observed to be hemodynamically stable with no acute findings seen on chest x-ray.  While in the ED she had a sudden drop of oxygen saturation to the 80s and demonstrated altered mental status/confusion, prompting endotracheal intubation on 06/24/2021, CTA obtained post intubation revealed pneumomediastinum and left pneumothorax.  Patient was admitted by CCM, she had a left-sided chest tube placed on 7/25.  She was subsequently extubated on 7/25th.  She has been off of IV pressors.  CTA chest was negative for PE, showed extensive pneumomediastinum throughout the chest extending into the base of the neck, question a degree of pneumopericardium as well.  Patient care was transferred to [facility] on 7/28.  CCM will continue to follow for chest tube management   Palliative care was asked to get involved to further discuss goals of care.   Today's Discussion (07/04/2021):  *Please note that this is a verbal dictation therefore any spelling or grammatical errors are due to the "Dragon Medical One" system interpretation.  Chart reviewed.   I met with Haidynn at bedside.  She was completing her breakfast.  We reviewed the plan which includes ideally continuing improvement and for any to transfer to a facility closer to her family outside of [place].  Any expresses a feeling of loss at the idea of no longer being able to go back to her home.  I provided emotional support through therapeutic listening.  Was able to share with me great memories from her past as well as her level of her grandchildren.  Ultimately, she is in agreement with transfer when the time comes and does  realize she is not in a position where she can care for herself any longer.  Questions and concerns addressed   Objective Assessment: Vital Signs Vitals:   07/04/21 0330 07/04/21 0841  BP: (!) 163/67 (!) 119/94  Pulse: 88 89  Resp: 17 19  Temp: 98.6 F (37 C) 97.9 F (36.6 C)  SpO2: 96% 94%    Intake/Output Summary (Last 24 hours) at 07/04/2021 1121 Last data filed at 07/04/2021 0900 Gross per 24 hour  Intake 1160 ml  Output 920 ml  Net 240 ml    Last Weight  Most recent update: 07/04/2021  3:31 AM    Weight  83.9 kg (184 lb 15.5 oz)            Gen:  Elderly F in NAD HEENT: Oral thrush, dry mucous membranes CV: Irregular rate and rhythm PULM: On 4LPM  ABD: soft/nontender EXT: No edema Neuro: Alert and oriented x3 - Hard of hearing  SUMMARY OF RECOMMENDATIONS   DNAR/DNI    Advance directives obtained   Appreciate medical social worker helping patient's granddaughter with potential placement options in the [place] area. Preferences: 1) [facility] [phone] 2) [facility] [phone] 3)[facility] [phone]   Patient does have the financial resources to afford ambulance transportation.   Appreciate PT/OT assessments - SNF recommendation   Wean O2 as tolerated - 4LPM presently  Oral thrush: continue oral care BID and nystatin qid  Goals: For improvements and optimization    Ongoing incremental palliative care support  *Pls note patient is hard of hearing and a lip reader, pull mask down to communicate  Time Spent: 25 Greater than 50% of the time was spent in counseling and coordination of care ______________________________________________________________________________________   [facility] Team Team Cell Phone: [phone] Please utilize secure chat with additional questions, if there is no response within 30 minutes please call the above phone  number  Palliative Medicine Team providers are available by phone from 7am to 7pm daily and can be reached through the team cell phone.  Should this patient require assistance outside of these hours, please call the patient's attending physician.     

## 2021-07-04 NOTE — Progress Notes (Signed)
Nutrition Follow-up  DOCUMENTATION CODES:   Not applicable  INTERVENTION:   - Increase Ensure Enlive po TID, each supplement provides 350 kcal and 20 grams of protein  - Continue Dysphagia 3 diet for ease of chewing  - Continue MVI with minerals daily  NUTRITION DIAGNOSIS:   Inadequate oral intake related to inability to eat as evidenced by NPO status.  Diet advanced   GOAL:   Patient will meet greater than or equal to 90% of their needs  Progressing  MONITOR:   PO intake, Supplement acceptance, Labs, Weight trends, I & O's  REASON FOR ASSESSMENT:   Ventilator    ASSESSMENT:   87 y.o. female who is a former smoker and has medical history of HTN, dyslipidemia, GERD, Morton's neuroma, idiopathic scoliosis, mitral valve prolapse, anxiety, psychosis, and diverticulitis. She presented to the ED via EMS d/t acute onset of hemoptysis. In the ED she was initially hemodynamically stable but then experienced sudden episode of O2 desaturation into the 80s and became altered with confusion. She was subsequently intubated in the ED.  7/25 - extubated 7/26 - chest tube placed for L pneumothorax, clear liquids, transferred to [facility] 7/27 - diet advanced to regular consistency   Continues on dysphagia 3 diet given poor dentition. Appetite progressing slowly. Last four meal  completions charted as 50%, 25%, 95%, and 50%. Taking Ensure twice daily and willing to increase to TID.   Admit weight: 73.5 kg Current weight: 83.9 kg   UOP: 920 ml x 24 hrs   Medications: reviewed  Labs: Na 134 (L) K 3.3 (L)   Diet Order:   Diet Order             DIET DYS 3 Room service appropriate? Yes with Assist; Fluid consistency: Thin  Diet effective now                   EDUCATION NEEDS:   Education needs have been addressed  Skin:  Skin Assessment: Reviewed RN Assessment  Last BM:  8/2  Height:   Ht Readings from Last 1 Encounters:  06/25/21 '5\' 2"'$  (1.575 m)    Weight:   Wt  Readings from Last 1 Encounters:  07/04/21 83.9 kg    Ideal Body Weight:  50 kg  BMI:  Body mass index is 33.83 kg/m.  Estimated Nutritional Needs:   Kcal:  1400-1600  Protein:  70-85 grams  Fluid:  >/= 1.5 L/day   Mariana Single MS, RD, LDN, CNSC Clinical Nutrition Pager listed in [place]

## 2021-07-04 NOTE — Progress Notes (Signed)
Bladder scan showed 411 ml retained. Foley order obtained per MD. Foley catheter placed per protocol with 600 ml of amber urine returned. Patient tolerated well.

## 2021-07-04 NOTE — Progress Notes (Signed)
PROGRESS NOTE    Mary Strickland  [id number] DOB: [birth year] DOA: 06/24/2021 PCP: Burnard Bunting, MD   Brief Narrative: 87 year old prior history of smoking, hypertension, mitral valve prolapse who was admitted with acute onset hemoptysis.  In the ED, she was initially observed to be hemodynamically stable with no acute findings seen on chest x-ray.  While in the ED she had a sudden drop of oxygen saturation to the 80s and demonstrated altered mental status/confusion, prompting endotracheal intubation on 06/24/2021, CTA obtained post intubation revealed pneumomediastinum and left pneumothorax.  Patient was admitted by CCM, she had a left-sided chest tube placed on 7/25.  She was subsequently extubated on 7/25th.  She has been off of IV pressors.  CTA chest was negative for PE, showed extensive pneumomediastinum throughout the chest extending into the base of the neck, question a degree of pneumopericardium as well.  Patient care was transferred to [facility] on 7/28.   Patient was treated for acute respiratory failure in the setting of pneumonia and bilateral pulmonary infiltrates, left pneumothorax and pneumomediastinum.  She had a chest tube placed on 7/25.  Was transferred to triad service and subsequently developed hemoptysis and worsening hypoxemia.  CCM discussed with family and plan was for more comfort care approach.  Chest tube was removed 7/28.  Patient subsequently became more stable requiring less oxygen supplementation.  Palliative care was consulted and plan is for conservative measure of care and transition patient to rehab.  Currently patient is awaiting skilled nursing facility, closer to her family in [place]..     Assessment & Plan:   Principal Problem:   Acute hypoxemic respiratory failure (HCC) Active Problems:   Hypertension   Hemoptysis   Abnormal CXR   Leukocytosis   Subcutaneous emphysema (HCC)   Pneumoperitoneum of unknown etiology   Pneumothorax on  right  1-Acute Respiratory Failure in the setting of CAP and bilateral pulmonary infiltrates, left pneumothorax with pneumomediastinum: -Hemoptysis initial concern was for alveolar hemorrhage versus hematemesis.  No further episode of hemoptysis since admission. -Patient was intubated on arrival 7/23 s/p extubation on 7/25. -Patient had left side chest tube placement 7/25. -Chest x-ray 7/28 two-point stable position. -Continue with Unasyn. 10/10. Completed TX 8/02. -Flutter valve and continue with Mucinex.  -Patient develop hemoptysis again and worsening hypoxemia on 7/28. CCM discussed with family plan is for more comfort care approach. Palliative care consulted. Plan for conservative measure of care. Plan for SNF for rehab.  -Chest tube removed 7/28 -Follow WBC trend. Trending down.  Currently on 4 L oxygen.  Stable, awaiting Rehab.   2-Hypophosphatemia: Replaced.   3-Elevation of troponin: Cardiology was  consulted. Elevation of troponin likely related to Respiratory failure Pneumothorax and pneumomediastinum, demand ischemia from CAD.  Started low dose metoprolol/  ECHO 7/25: Fraction 60 to 65%.  Left ventricular function is normal.  Right ventricular function is normal.  4-malpositioned left IJ CVL: CTA neck 7/27 no acute or focal vascular injury or pseudoaneurysmal Vascular was initially consulted and they removed line.   5-Hypertension: Continue to hold Diovan and Norvasc, home medication. Continue with as needed hydralazine.  Transaminases: Possible related to hypovolemia.  Follow trend  Anemia: follow hb Mild Hyponatremia; monitor.  Dizziness; resolved.  Monitor.  Hypokalemia; Replete orally.  Urine Retention;  Fail Voiding trial.  Foley catheter reinserted 8/02.  Nutrition Problem: Inadequate oral intake Etiology: inability to eat    Signs/Symptoms: NPO status    Interventions: MVI, Ensure Enlive (each supplement provides 350kcal and 20 grams of  protein),  Other (Comment) (downgrade diet)  Estimated body mass index is 33.83 kg/m as calculated from the following:   Height as of this encounter: '5\' 2"'$  (1.575 m).   Weight as of this encounter: 83.9 kg.   DVT prophylaxis: Not anticoagulation due to initial presentation of hemoptysis Code Status: DNR Family Communication: care disucussed with grandaughter 8/01   Disposition Plan:  Status is: Inpatient  Remains inpatient appropriate because:Hemodynamically unstable  Dispo: The patient is from: Home              Anticipated d/c is to:  Be determined              Patient currently is medically stable to d/c.   Difficult to place patient No        Consultants:  CCM admitted patient Cardiology Vascular surgery  Procedures:  Echo: Normal ejection fraction  Antimicrobials:  Unasyn  Subjective: Denies worsening dyspnea.  She has not been able to urinate since foley catheter was removed. She has had BM.    Objective: Vitals:   07/04/21 0330 07/04/21 0841 07/04/21 1152 07/04/21 1218  BP: (!) 163/67 (!) 119/94 (!) 177/80 (!) 145/70  Pulse: 88 89 84   Resp: '17 19 19   '$ Temp: 98.6 F (37 C) 97.9 F (36.6 C) 98.4 F (36.9 C)   TempSrc: Oral Oral Oral   SpO2: 96% 94% 93%   Weight: 83.9 kg     Height:        Intake/Output Summary (Last 24 hours) at 07/04/2021 1527 Last data filed at 07/04/2021 0900 Gross per 24 hour  Intake 680 ml  Output 620 ml  Net 60 ml    Filed Weights   07/02/21 0500 07/03/21 0425 07/04/21 0330  Weight: 81.3 kg 83.5 kg 83.9 kg    Examination:  General exam: NAD Respiratory system: BL ronchus Cardiovascular system: S 1, S 2 RRR Gastrointestinal system: BS present, soft,  Central nervous system: Alert Extremities: No edema    Data Reviewed: I have personally reviewed following labs and imaging studies  CBC: Recent Labs  Lab 06/28/21 0513 06/29/21 0020 06/30/21 0050 07/03/21 0046 07/04/21 0243  WBC 15.8* 14.8* 12.7* 15.6* 13.1*   HGB 9.2* 9.2* 8.5* 8.1* 8.5*  HCT 26.1* 26.3* 24.6* 24.0* 25.5*  MCV 89.7 88.9 89.1 89.9 90.1  PLT 168 178 187 [id number]    Basic Metabolic Panel: Recent Labs  Lab 06/27/21 2157 06/28/21 0308 06/29/21 0020 06/30/21 0050 07/01/21 0058 07/03/21 0046 07/04/21 0243  NA 134* 136 132* 133*  --  134* 134*  K 3.4* 3.9 3.7 3.7  --  3.3* 3.3*  CL 103 103 102 99  --  100 99  CO2 21* '22 23 26  '$ --  27 25  GLUCOSE 138* 154* 113* 87  --  86 90  BUN 27* 31* 33* 29*  --  27* 26*  CREATININE 1.06* 1.07* 0.90 0.89  --  0.82 0.92  CALCIUM 8.1* 8.4* 8.6* 8.6*  --  8.3* 8.5*  MG 2.1 2.1  --   --   --   --   --   PHOS  --  2.6 2.1* 3.3 2.9  --   --     GFR: Estimated Creatinine Clearance: 42.4 mL/min (by C-G formula based on SCr of 0.92 mg/dL). Liver Function Tests: Recent Labs  Lab 06/28/21 0308  AST 64*  ALT 29  ALKPHOS 31*  BILITOT 1.0  PROT 5.8*  ALBUMIN 2.6*  No results for input(s): LIPASE, AMYLASE in the last 168 hours. No results for input(s): AMMONIA in the last 168 hours. Coagulation Profile: No results for input(s): INR, PROTIME in the last 168 hours.  Cardiac Enzymes: No results for input(s): CKTOTAL, CKMB, CKMBINDEX, TROPONINI in the last 168 hours. BNP (last 3 results) No results for input(s): PROBNP in the last 8760 hours. HbA1C: No results for input(s): HGBA1C in the last 72 hours. CBG: Recent Labs  Lab 06/28/21 0005 06/28/21 0723 06/28/21 1136 06/28/21 1512 06/29/21 0855  GLUCAP 131* 125* 112* 148* 106*    Lipid Profile: No results for input(s): CHOL, HDL, LDLCALC, TRIG, CHOLHDL, LDLDIRECT in the last 72 hours. Thyroid Function Tests: No results for input(s): TSH, T4TOTAL, FREET4, T3FREE, THYROIDAB in the last 72 hours. Anemia Panel: No results for input(s): VITAMINB12, FOLATE, FERRITIN, TIBC, IRON, RETICCTPCT in the last 72 hours. Sepsis Labs: Recent Labs  Lab 06/28/21 0308 06/29/21 0020  PROCALCITON 1.35 0.95     Recent Results (from the  past 240 hour(s))  Resp Panel by RT-PCR (Flu A&B, Covid) Nasopharyngeal Swab     Status: None   Collection Time: 06/24/21 11:04 PM   Specimen: Nasopharyngeal Swab; Nasopharyngeal(NP) swabs in vial transport medium  Result Value Ref Range Status   SARS Coronavirus 2 by RT PCR NEGATIVE NEGATIVE Final    Comment: (NOTE) SARS-CoV-2 target nucleic acids are NOT DETECTED.  The SARS-CoV-2 RNA is generally detectable in upper respiratory specimens during the acute phase of infection. The lowest concentration of SARS-CoV-2 viral copies this assay can detect is 138 copies/mL. A negative result does not preclude SARS-Cov-2 infection and should not be used as the sole basis for treatment or other patient management decisions. A negative result may occur with  improper specimen collection/handling, submission of specimen other than nasopharyngeal swab, presence of viral mutation(s) within the areas targeted by this assay, and inadequate number of viral copies(<138 copies/mL). A negative result must be combined with clinical observations, patient history, and epidemiological information. The expected result is Negative.  Fact Sheet for Patients:  EntrepreneurPulse.com.au  Fact Sheet for Healthcare Providers:  IncredibleEmployment.be  This test is no t yet approved or cleared by the [place] FDA and  has been authorized for detection and/or diagnosis of SARS-CoV-2 by FDA under an Emergency Use Authorization (EUA). This EUA will remain  in effect (meaning this test can be used) for the duration of the COVID-19 declaration under Section 564(b)(1) of the Act, 21 U.S.C.section 360bbb-3(b)(1), unless the authorization is terminated  or revoked sooner.       Influenza A by PCR NEGATIVE NEGATIVE Final   Influenza B by PCR NEGATIVE NEGATIVE Final    Comment: (NOTE) The Xpert Xpress SARS-CoV-2/FLU/RSV plus assay is intended as an aid in the diagnosis of  influenza from Nasopharyngeal swab specimens and should not be used as a sole basis for treatment. Nasal washings and aspirates are unacceptable for Xpert Xpress SARS-CoV-2/FLU/RSV testing.  Fact Sheet for Patients: EntrepreneurPulse.com.au  Fact Sheet for Healthcare Providers: IncredibleEmployment.be  This test is not yet approved or cleared by the [place] FDA and has been authorized for detection and/or diagnosis of SARS-CoV-2 by FDA under an Emergency Use Authorization (EUA). This EUA will remain in effect (meaning this test can be used) for the duration of the COVID-19 declaration under Section 564(b)(1) of the Act, 21 U.S.C. section 360bbb-3(b)(1), unless the authorization is terminated or revoked.  Performed at [facility], [address] [place], [place]  [address]   MRSA Next Gen by PCR, Nasal     Status: None   Collection Time: 06/25/21  5:40 AM   Specimen: Nasal Mucosa; Nasal Swab  Result Value Ref Range Status   MRSA by PCR Next Gen NOT DETECTED NOT DETECTED Final    Comment: (NOTE) The GeneXpert MRSA Assay (FDA approved for NASAL specimens only), is one component of a comprehensive MRSA colonization surveillance program. It is not intended to diagnose MRSA infection nor to guide or monitor treatment for MRSA infections. Test performance is not FDA approved in patients less than 22 years old. Performed at [facility], [place] [address], [place], [place] [address]   Respiratory (~20 pathogens) panel by PCR     Status: None   Collection Time: 06/27/21  9:48 AM   Specimen: Nasopharyngeal Swab; Respiratory  Result Value Ref Range Status   Adenovirus NOT DETECTED NOT DETECTED Final   Coronavirus 229E NOT DETECTED NOT DETECTED Final    Comment: (NOTE) The Coronavirus on the Respiratory Panel, DOES NOT test for the novel  Coronavirus (2019 nCoV)    Coronavirus HKU1 NOT DETECTED NOT DETECTED  Final   Coronavirus NL63 NOT DETECTED NOT DETECTED Final   Coronavirus OC43 NOT DETECTED NOT DETECTED Final   Metapneumovirus NOT DETECTED NOT DETECTED Final   Rhinovirus / Enterovirus NOT DETECTED NOT DETECTED Final   Influenza A NOT DETECTED NOT DETECTED Final   Influenza B NOT DETECTED NOT DETECTED Final   Parainfluenza Virus 1 NOT DETECTED NOT DETECTED Final   Parainfluenza Virus 2 NOT DETECTED NOT DETECTED Final   Parainfluenza Virus 3 NOT DETECTED NOT DETECTED Final   Parainfluenza Virus 4 NOT DETECTED NOT DETECTED Final   Respiratory Syncytial Virus NOT DETECTED NOT DETECTED Final   Bordetella pertussis NOT DETECTED NOT DETECTED Final   Bordetella Parapertussis NOT DETECTED NOT DETECTED Final   Chlamydophila pneumoniae NOT DETECTED NOT DETECTED Final   Mycoplasma pneumoniae NOT DETECTED NOT DETECTED Final    Comment: Performed at [facility], [place]. [address] [place], [place] [address]          Radiology Studies: No results found.      Scheduled Meds:  Chlorhexidine Gluconate Cloth  6 each Topical Daily   feeding supplement  237 mL Oral TID BM   guaiFENesin  1,200 mg Oral BID   mouth rinse  15 mL Mouth Rinse BID   metoprolol tartrate  12.5 mg Oral BID   multivitamin with minerals  1 tablet Oral Daily   nystatin  5 mL Oral QID   pantoprazole  40 mg Oral BID   Continuous Infusions:  sodium chloride Stopped (06/27/21 0941)   ampicillin-sulbactam (UNASYN) IV 3 g (07/04/21 1503)     LOS: 9 days    Time spent: 35 minutes,     Fergus Throne A Wanda Rideout, MD [facility]   If 7PM-7AM, please contact night-coverage [email or web address]  07/04/2021, 3:27 PM

## 2021-07-04 NOTE — TOC Progression Note (Signed)
Transition of Care [facility]) - Progression Note    Patient Details  Name: Mary Strickland MRN: [id number] Date of Birth: 09-Nov-1930  [facility] CM/SW [place], [place] Phone Number: 07/04/2021, 5:03 PM  Clinical Narrative:     Faxed clinicals to [facility] and [place] [phone] requested by family   CSW will continue to follow and assist with discharge planning.  Thurmond Butts, MSW, LCSW Clinical Social Worker    Expected Discharge Plan: Home/Self Care Barriers to Discharge: Continued Medical Work up  Expected Discharge Plan and Services Expected Discharge Plan: Home/Self Care In-house Referral: Clinical Social Work     Living arrangements for the past 2 months: Single Family Home                                       Social Determinants of Health (SDOH) Interventions    Readmission Risk Interventions No flowsheet data found.

## 2021-07-04 NOTE — Progress Notes (Signed)
Notified MD of bladder scan results 600cc. Orders given and carried out. Cath results 620cc's. Will bladder scan in 4hrs. Pt. Tolerated in and out cath without difficulty. Will continue to monitor closely.Pt. comfortable and Call bell with-in reach.

## 2021-07-05 ENCOUNTER — Inpatient Hospital Stay (HOSPITAL_COMMUNITY): Payer: Medicare HMO

## 2021-07-05 DIAGNOSIS — J9601 Acute respiratory failure with hypoxia: Secondary | ICD-10-CM | POA: Diagnosis not present

## 2021-07-05 MED ORDER — POTASSIUM CHLORIDE 20 MEQ PO PACK
40.0000 meq | PACK | Freq: Once | ORAL | Status: AC
  Administered 2021-07-05: 40 meq via ORAL
  Filled 2021-07-05: qty 2

## 2021-07-05 NOTE — Progress Notes (Signed)
PROGRESS NOTE    Mary Strickland  [id number] DOB: 1930/11/10 DOA: 06/24/2021 PCP: Burnard Bunting, MD   Brief Narrative:  87 year old prior history of smoking, hypertension, mitral valve prolapse who was admitted with acute onset hemoptysis.  In the ED, she was initially observed to be hemodynamically stable with no acute findings seen on chest x-ray.  While in the ED she had a sudden drop of oxygen saturation to the 80s and demonstrated altered mental status/confusion, prompting endotracheal intubation on 06/24/2021, CTA obtained post intubation revealed pneumomediastinum and left pneumothorax.  Patient was admitted by CCM, she had a left-sided chest tube placed on 7/25.  She was subsequently extubated on 7/25th.  She has been off of IV pressors.  CTA chest was negative for PE, showed extensive pneumomediastinum throughout the chest extending into the base of the neck, question a degree of pneumopericardium as well.  Patient care was transferred to [facility] on 7/28.   Patient was treated for acute respiratory failure in the setting of pneumonia and bilateral pulmonary infiltrates, left pneumothorax and pneumomediastinum.  She had a chest tube placed on 7/25.  Was transferred to triad service and subsequently developed hemoptysis and worsening hypoxemia.  CCM discussed with family and plan was for more comfort care approach.  Chest tube was removed 7/28.  Patient subsequently became more stable requiring less oxygen supplementation.  Palliative care was consulted and plan is for conservative measure of care and transition patient to rehab.   Currently patient is awaiting skilled nursing facility, closer to her family in [place]..   Assessment & Plan:   Principal Problem:   Acute hypoxemic respiratory failure (HCC) Active Problems:   Hypertension   Hemoptysis   Abnormal CXR   Leukocytosis   Subcutaneous emphysema (HCC)   Pneumoperitoneum of unknown etiology   Pneumothorax on  right    Acute Respiratory Failure in the setting of CAP and bilateral pulmonary infiltrates, left pneumothorax with pneumomediastinum: Hemoptysis, resolved Initially taken to the ICU intubated 7/23, extubated 7/25 - Left-sided chest tube placed 7/25, removed 7/28 - Will repeat 2V CXR today - Completed 10-day course of Unasyn on 8/2 - PT recommended SNF.  Pending placement    Elevated troponin -Cardiology was  consulted. Elevation of troponin likely related to Respiratory failure Pneumothorax and pneumomediastinum, demand ischemia from CAD. - Echo 7/25-EF 60 to 65% -Started metoprolol 12.5 mg twice daily   Malpositioned left IJ CTA neck 7/27 no acute or focal vascular injury or pseudoaneurysmal -Seen by vascular, removed    Essential hypertension -Currently on metoprolol.  Home CCB on hold   Acute urinary retention - Failed voiding trial.  Foley catheter reinserted 8/2  Seen by palliative care team   DVT prophylaxis: SCDs Start: 06/25/21 0310 Code Status: DNR Family Communication:  Called Granddaughter, left a Advertising account executive.   Status is: Inpatient  Remains inpatient appropriate because:Unsafe d/c plan  Dispo: The patient is from: Home              Anticipated d/c is to: SNF              Patient currently is medically stable to d/c.   Difficult to place patient No       Nutritional status  Nutrition Problem: Inadequate oral intake Etiology: inability to eat  Signs/Symptoms: NPO status  Interventions: MVI, Ensure Enlive (each supplement provides 350kcal and 20 grams of protein), Other (Comment) (downgrade diet)  Body mass index is 33.95 kg/m.  Subjective: Sitting up in the chair, no new complaints.   Review of Systems Otherwise negative except as per HPI, including: General: Denies fever, chills, night sweats or unintended weight loss. Resp: Denies cough, wheezing, shortness of breath. Cardiac: Denies chest pain, palpitations, orthopnea,  paroxysmal nocturnal dyspnea. GI: Denies abdominal pain, nausea, vomiting, diarrhea or constipation GU: Denies dysuria, frequency, hesitancy or incontinence MS: Denies muscle aches, joint pain or swelling Neuro: Denies headache, neurologic deficits (focal weakness, numbness, tingling), abnormal gait Psych: Denies anxiety, depression, SI/HI/AVH Skin: Denies new rashes or lesions ID: Denies sick contacts, exotic exposures, travel  Examination:  General exam: Appears calm and comfortable;3 L [place]. Elderly frail Respiratory system: Clear to auscultation. Respiratory effort normal. Cardiovascular system: S1 & S2 heard, RRR. No JVD, murmurs, rubs, gallops or clicks. No pedal edema. Gastrointestinal system: Abdomen is nondistended, soft and nontender. No organomegaly or masses felt. Normal bowel sounds heard. Central nervous system: Alert and oriented. No focal neurological deficits. Extremities: Symmetric 5 x 5 power. Skin: No rashes, lesions or ulcers Psychiatry: Judgement and insight appear normal. Mood & affect appropriate.     Objective: Vitals:   07/04/21 2334 07/05/21 0345 07/05/21 0451 07/05/21 0813  BP: (!) 124/43 (!) 179/91 (!) 151/65 (!) 102/44  Pulse: 85 85 85 73  Resp: 20 20 (!) 21 17  Temp: 98.6 F (37 C) 98.6 F (37 C)  98 F (36.7 C)  TempSrc: Oral Oral  Oral  SpO2: 97% 92% 96% 94%  Weight:  84.2 kg    Height:        Intake/Output Summary (Last 24 hours) at 07/05/2021 0827 Last data filed at 07/05/2021 0348 Gross per 24 hour  Intake 260 ml  Output 1000 ml  Net -740 ml   Filed Weights   07/03/21 0425 07/04/21 0330 07/05/21 0345  Weight: 83.5 kg 83.9 kg 84.2 kg     Data Reviewed:   CBC: Recent Labs  Lab 06/29/21 0020 06/30/21 0050 07/03/21 0046 07/04/21 0243  WBC 14.8* 12.7* 15.6* 13.1*  HGB 9.2* 8.5* 8.1* 8.5*  HCT 26.3* 24.6* 24.0* 25.5*  MCV 88.9 89.1 89.9 90.1  PLT 178 187 263 [id number]   Basic Metabolic Panel: Recent Labs  Lab 06/29/21 0020  06/30/21 0050 07/01/21 0058 07/03/21 0046 07/04/21 0243  NA 132* 133*  --  134* 134*  K 3.7 3.7  --  3.3* 3.3*  CL 102 99  --  100 99  CO2 23 26  --  27 25  GLUCOSE 113* 87  --  86 90  BUN 33* 29*  --  27* 26*  CREATININE 0.90 0.89  --  0.82 0.92  CALCIUM 8.6* 8.6*  --  8.3* 8.5*  PHOS 2.1* 3.3 2.9  --   --    GFR: Estimated Creatinine Clearance: 42.5 mL/min (by C-G formula based on SCr of 0.92 mg/dL). Liver Function Tests: No results for input(s): AST, ALT, ALKPHOS, BILITOT, PROT, ALBUMIN in the last 168 hours. No results for input(s): LIPASE, AMYLASE in the last 168 hours. No results for input(s): AMMONIA in the last 168 hours. Coagulation Profile: No results for input(s): INR, PROTIME in the last 168 hours. Cardiac Enzymes: No results for input(s): CKTOTAL, CKMB, CKMBINDEX, TROPONINI in the last 168 hours. BNP (last 3 results) No results for input(s): PROBNP in the last 8760 hours. HbA1C: No results for input(s): HGBA1C in the last 72 hours. CBG: Recent Labs  Lab 06/28/21 1136 06/28/21 1512 06/29/21 0855  GLUCAP 112* 148* 106*  Lipid Profile: No results for input(s): CHOL, HDL, LDLCALC, TRIG, CHOLHDL, LDLDIRECT in the last 72 hours. Thyroid Function Tests: No results for input(s): TSH, T4TOTAL, FREET4, T3FREE, THYROIDAB in the last 72 hours. Anemia Panel: No results for input(s): VITAMINB12, FOLATE, FERRITIN, TIBC, IRON, RETICCTPCT in the last 72 hours. Sepsis Labs: Recent Labs  Lab 06/29/21 0020  PROCALCITON 0.95    Recent Results (from the past 240 hour(s))  Respiratory (~20 pathogens) panel by PCR     Status: None   Collection Time: 06/27/21  9:48 AM   Specimen: Nasopharyngeal Swab; Respiratory  Result Value Ref Range Status   Adenovirus NOT DETECTED NOT DETECTED Final   Coronavirus 229E NOT DETECTED NOT DETECTED Final    Comment: (NOTE) The Coronavirus on the Respiratory Panel, DOES NOT test for the novel  Coronavirus (2019 nCoV)    Coronavirus  HKU1 NOT DETECTED NOT DETECTED Final   Coronavirus NL63 NOT DETECTED NOT DETECTED Final   Coronavirus OC43 NOT DETECTED NOT DETECTED Final   Metapneumovirus NOT DETECTED NOT DETECTED Final   Rhinovirus / Enterovirus NOT DETECTED NOT DETECTED Final   Influenza A NOT DETECTED NOT DETECTED Final   Influenza B NOT DETECTED NOT DETECTED Final   Parainfluenza Virus 1 NOT DETECTED NOT DETECTED Final   Parainfluenza Virus 2 NOT DETECTED NOT DETECTED Final   Parainfluenza Virus 3 NOT DETECTED NOT DETECTED Final   Parainfluenza Virus 4 NOT DETECTED NOT DETECTED Final   Respiratory Syncytial Virus NOT DETECTED NOT DETECTED Final   Bordetella pertussis NOT DETECTED NOT DETECTED Final   Bordetella Parapertussis NOT DETECTED NOT DETECTED Final   Chlamydophila pneumoniae NOT DETECTED NOT DETECTED Final   Mycoplasma pneumoniae NOT DETECTED NOT DETECTED Final    Comment: Performed at [facility], [place]. [address] [place], [place] [address]         Radiology Studies: No results found.      Scheduled Meds:  Chlorhexidine Gluconate Cloth  6 each Topical Daily   feeding supplement  237 mL Oral TID BM   guaiFENesin  1,200 mg Oral BID   mouth rinse  15 mL Mouth Rinse BID   metoprolol tartrate  12.5 mg Oral BID   multivitamin with minerals  1 tablet Oral Daily   nystatin  5 mL Oral QID   pantoprazole  40 mg Oral BID   Continuous Infusions:  sodium chloride Stopped (06/27/21 0941)   ampicillin-sulbactam (UNASYN) IV 3 g (07/05/21 0553)     LOS: 10 days   Time spent= 35 mins    Sheriff Rodenberg Arsenio Loader, MD [facility]  If 7PM-7AM, please contact night-coverage  07/05/2021, 8:27 AM

## 2021-07-05 NOTE — Progress Notes (Signed)
Occupational Therapy Treatment Patient Details Name: Mary Strickland MRN: [id number] DOB: 05-24-1930 Today's Date: 07/05/2021    History of present illness 87 y.o. female presents to [place] [facility] ED on 06/24/2021 with sudden onset hemoptysis. Pt experienced a bout of hypoxia in ED with AMS and was intubated on 7/23. Pt was transported to [facility] hospital on 7/24, found to have pneumomediastinum, pneumothorax. Chest tube inserted 7/24. Pt extubated on 7/25. PMH includes history of smoking, hypertension, mitral valve prolapse.   OT comments  Pt received in bed, agreeable for any and all OT activities. Pt able to progress taking steps to/from sink for seated ADLs using RW this AM. Pt does fatigue quickly requiring Min A for UB ADLs and Max A for LB ADLs, requiring frequent rest breaks. SpO2 >94% on 4 L O2 during activities. Continue to recommend SNF rehab. Plan to provide UE HEP education in next session to maximize UB strength.   Follow Up Recommendations  SNF    Equipment Recommendations  Other (comment) (Rolling walker)    Recommendations for Other Services      Precautions / Restrictions Precautions Precautions: Fall Precaution Comments: monitor SpO2 Restrictions Weight Bearing Restrictions: No       Mobility Bed Mobility Overal bed mobility: Needs Assistance Bed Mobility: Supine to Sit     Supine to sit: Min assist     General bed mobility comments: Min A for handheld assist to advance trunk, light assist to scoot hips    Transfers Overall transfer level: Needs assistance Equipment used: Rolling walker (2 wheeled) Transfers: Sit to/from Stand Sit to Stand: Min assist         General transfer comment: Light Min A for power up from bedside and chair at sink. cues for hand placement needed    Balance Overall balance assessment: Needs assistance Sitting-balance support: No upper extremity supported;Feet supported Sitting balance-[name] Scale: Good     Standing  balance support: Bilateral upper extremity supported;During functional activity Standing balance-[name] Scale: Poor Standing balance comment: reliant on RW                           ADL either performed or assessed with clinical judgement   ADL Overall ADL's : Needs assistance/impaired     Grooming: Minimal assistance;Sitting;Brushing hair;Wash/dry face Grooming Details (indicate cue type and reason): Able to wash face and brush hair in front though fatigued and required assist to brush remainder of hair     Lower Body Bathing: Maximal assistance;Sit to/from stand Lower Body Bathing Details (indicate cue type and reason): Max A for posterior hygiene with RW                     Functional mobility during ADLs: Minimal assistance;Rolling walker;Cueing for sequencing;Cueing for safety General ADL Comments: pt limited primarily by weakness and poor endurance. Able to progress taking steps to sink for seated ADLs and then steps back to sit up in recliner     Vision   Vision Assessment?: No apparent visual deficits   Perception     Praxis      Cognition Arousal/Alertness: Awake/alert Behavior During Therapy: WFL for tasks assessed/performed Overall Cognitive Status: Within Functional Limits for tasks assessed                                 General Comments: WFL for basic tasks, did not formally assess  Exercises     Shoulder Instructions       General Comments SpO2 briefly 88% when O2 removed for washing face/brushing hair. Sustained 94% on 4 L O2 for activities    Pertinent Vitals/ Pain       Pain Assessment: No/denies pain  Home Living                                          Prior Functioning/Environment              Frequency  Min 2X/week        Progress Toward Goals  OT Goals(current goals can now be found in the care plan section)  Progress towards OT goals: Progressing toward goals  Acute  Rehab OT Goals Patient Stated Goal: to improve strength and eventually return home OT Goal Formulation: With patient Time For Goal Achievement: 07/16/21 Potential to Achieve Goals: Fair ADL Goals Pt Will Perform Grooming: with supervision;standing Pt Will Perform Lower Body Bathing: with set-up;sit to/from stand Pt Will Perform Lower Body Dressing: with set-up;sit to/from stand Pt Will Transfer to Toilet: with supervision;ambulating;grab bars Pt Will Perform Toileting - Clothing Manipulation and hygiene: Independently Additional ADL Goal #1: Pt will tolerate OOB functional acutivity for at least 5 minutes to progress indep towards ADLs  Plan Discharge plan remains appropriate    Co-evaluation                 AM-PAC OT "6 Clicks" Daily Activity     Outcome Measure   Help from another person eating meals?: None Help from another person taking care of personal grooming?: A Little Help from another person toileting, which includes using toliet, bedpan, or urinal?: A Little Help from another person bathing (including washing, rinsing, drying)?: A Lot Help from another person to put on and taking off regular upper body clothing?: A Little Help from another person to put on and taking off regular lower body clothing?: A Lot 6 Click Score: 17    End of Session Equipment Utilized During Treatment: Gait belt;Rolling walker;Oxygen  OT Visit Diagnosis: Unsteadiness on feet (R26.81);Muscle weakness (generalized) (M62.81);Pain   Activity Tolerance Patient tolerated treatment well;Patient limited by fatigue   Patient Left in chair;with call bell/phone within reach;with chair alarm set   Nurse Communication Mobility status;Precautions;Weight bearing status        Time: [id number] OT Time Calculation (min): 48 min  Charges: OT General Charges $OT Visit: 1 Visit OT Treatments $Self Care/Home Management : 23-37 mins $Therapeutic Activity: 8-22 mins  Malachy Chamber, OTR/L Acute Rehab  Services Office: [phone]    Layla Maw 07/05/2021, 11:10 AM

## 2021-07-05 NOTE — Progress Notes (Signed)
Physical Therapy Treatment Patient Details Name: Mary Strickland MRN: [id number] DOB: 1930-03-06 Today's Date: 07/05/2021    History of Present Illness 87 y.o. female presents to [place] [facility] ED on 06/24/2021 with sudden onset hemoptysis. Pt experienced a bout of hypoxia in ED with AMS and was intubated on 7/23. Pt was transported to [facility] hospital on 7/24, found to have pneumomediastinum, pneumothorax. Chest tube inserted 7/24. Pt extubated on 7/25. PMH includes history of smoking, hypertension, mitral valve prolapse.    PT Comments    Pt received in recliner. Assisted to/from for BM. She required min assist transfers, and min assist +2 safety ambulation 15' x 2 with RW. Pt mobilized on 4L O2 with SpO2 in 90s. Mobility/gait distance limited by fatigue. Pt returned to recliner at end of session.    Follow Up Recommendations  SNF     Equipment Recommendations  Rolling walker with 5" wheels;3in1 (PT)    Recommendations for Other Services       Precautions / Restrictions Precautions Precautions: Fall;Other (comment) Precaution Comments: monitor SpO2 Restrictions Weight Bearing Restrictions: No    Mobility  Bed Mobility Overal bed mobility: Needs Assistance Bed Mobility: Supine to Sit     Supine to sit: Min assist     General bed mobility comments: Pt in recliner.    Transfers Overall transfer level: Needs assistance Equipment used: Rolling walker (2 wheeled) Transfers: Sit to/from Stand Sit to Stand: Min assist         General transfer comment: assist to power up and stabilize balance  Ambulation/Gait Ambulation/Gait assistance: Min assist;+2 safety/equipment Gait Distance (Feet): 15 Feet (x 2) Assistive device: Rolling walker (2 wheeled) Gait Pattern/deviations: Step-through pattern;Decreased stride length Gait velocity: decreased Gait velocity interpretation: <1.31 ft/sec, indicative of household ambulator General Gait Details: Pt ambulated to/from bathroom  for BM. Mobilized on 4L O2. VSS. Several standing rest breaks due to fatigue/SOB.   Stairs             Wheelchair Mobility    Modified Rankin (Stroke Patients Only)       Balance Overall balance assessment: Needs assistance Sitting-balance support: No upper extremity supported;Feet supported Sitting balance-[name] Scale: Good     Standing balance support: Bilateral upper extremity supported;During functional activity Standing balance-[name] Scale: Poor Standing balance comment: reliant on RW                            Cognition Arousal/Alertness: Awake/alert Behavior During Therapy: WFL for tasks assessed/performed Overall Cognitive Status: Within Functional Limits for tasks assessed                                 General Comments: Pt convinced there was a spider on the bottom side of bedside today. Therapist was unable to locate a spider. Pt stating "it must be hiding."      Exercises      General Comments General comments (skin integrity, edema, etc.): Pt on 4L continuous O2 during session with SpO2 in 90s.      Pertinent Vitals/Pain Pain Assessment: No/denies pain    Home Living                      Prior Function            PT Goals (current goals can now be found in the care plan section) Acute Rehab PT Goals Patient  Stated Goal: get better Progress towards PT goals: Progressing toward goals    Frequency    Min 2X/week      PT Plan Current plan remains appropriate    Co-evaluation              AM-PAC PT "6 Clicks" Mobility   Outcome Measure  Help needed turning from your back to your side while in a flat bed without using bedrails?: A Little Help needed moving from lying on your back to sitting on the side of a flat bed without using bedrails?: A Little Help needed moving to and from a bed to a chair (including a wheelchair)?: A Little Help needed standing up from a chair using your arms (e.g.,  wheelchair or bedside chair)?: A Little Help needed to walk in hospital room?: Total Help needed climbing 3-5 steps with a railing? : Total 6 Click Score: 14    End of Session Equipment Utilized During Treatment: Gait belt;Oxygen Activity Tolerance: Patient tolerated treatment well Patient left: in chair;with call bell/phone within reach;with chair alarm set Nurse Communication: Mobility status PT Visit Diagnosis: Other abnormalities of gait and mobility (R26.89);Muscle weakness (generalized) (M62.81)     Time: [id number] PT Time Calculation (min) (ACUTE ONLY): 31 min  Charges:  $Gait Training: 23-37 mins                     Lorrin Goodell, [place]  Office # [phone] Pager [phone]    Lorriane Shire 07/05/2021, 1:12 PM

## 2021-07-06 DIAGNOSIS — J9601 Acute respiratory failure with hypoxia: Secondary | ICD-10-CM | POA: Diagnosis not present

## 2021-07-06 LAB — BASIC METABOLIC PANEL
Anion gap: 8 (ref 5–15)
BUN: 21 mg/dL (ref 8–23)
CO2: 25 mmol/L (ref 22–32)
Calcium: 8 mg/dL — ABNORMAL LOW (ref 8.9–10.3)
Chloride: 102 mmol/L (ref 98–111)
Creatinine, Ser: 1.01 mg/dL — ABNORMAL HIGH (ref 0.44–1.00)
GFR, Estimated: 54 mL/min — ABNORMAL LOW (ref >60)
Glucose, Bld: 143 mg/dL — ABNORMAL HIGH (ref 70–99)
Potassium: 4.3 mmol/L (ref 3.5–5.1)
Sodium: 135 mmol/L (ref 135–145)

## 2021-07-06 LAB — MAGNESIUM: Magnesium: 2.1 mg/dL (ref 1.7–2.4)

## 2021-07-06 LAB — BRAIN NATRIURETIC PEPTIDE: B Natriuretic Peptide: 911 pg/mL — ABNORMAL HIGH (ref 0.0–100.0)

## 2021-07-06 LAB — PROCALCITONIN: Procalcitonin: <0.1 ng/mL

## 2021-07-06 MED ORDER — FUROSEMIDE 10 MG/ML IJ SOLN
20.0000 mg | Freq: Three times a day (TID) | INTRAMUSCULAR | Status: AC
  Administered 2021-07-06 (×2): 20 mg via INTRAVENOUS
  Filled 2021-07-06 (×2): qty 2

## 2021-07-06 NOTE — TOC Progression Note (Signed)
Transition of Care [facility]) - Progression Note    Patient Details  Name: Mary Strickland MRN: [id number] Date of Birth: 06-06-1930  Transition of Care [facility] CM/SW [place], [place] Phone Number: 07/06/2021, 10:17 AM  Clinical Narrative:     [facility]- spoke with admissions Rip Harbour, she will follow up with her corporate office today for their decision and call CSW back today.   Thurmond Butts, MSW, LCSW Clinical Social Worker    Expected Discharge Plan: Home/Self Care Barriers to Discharge: Continued Medical Work up  Expected Discharge Plan and Services Expected Discharge Plan: Home/Self Care In-house Referral: Clinical Social Work     Living arrangements for the past 2 months: Single Family Home Expected Discharge Date: 07/05/21                                     Social Determinants of Health (SDOH) Interventions    Readmission Risk Interventions No flowsheet data found.

## 2021-07-06 NOTE — Progress Notes (Signed)
PROGRESS NOTE    Mary Strickland  [id number] DOB: 05-29-1930 DOA: 06/24/2021 PCP: Burnard Bunting, MD   Brief Narrative:  87 year old prior history of smoking, hypertension, mitral valve prolapse who was admitted with acute onset hemoptysis.  In the ED, she was initially observed to be hemodynamically stable with no acute findings seen on chest x-ray.  While in the ED she had a sudden drop of oxygen saturation to the 80s and demonstrated altered mental status/confusion, prompting endotracheal intubation on 06/24/2021, CTA obtained post intubation revealed pneumomediastinum and left pneumothorax.  Patient was admitted by CCM, she had a left-sided chest tube placed on 7/25.  She was subsequently extubated on 7/25th.  She has been off of IV pressors.  CTA chest was negative for PE, showed extensive pneumomediastinum throughout the chest extending into the base of the neck, question a degree of pneumopericardium as well.  Patient care was transferred to [facility] on 7/28.   Patient was treated for acute respiratory failure in the setting of pneumonia and bilateral pulmonary infiltrates, left pneumothorax and pneumomediastinum.  She had a chest tube placed on 7/25.  Was transferred to triad service and subsequently developed hemoptysis and worsening hypoxemia.  CCM discussed with family and plan was for more comfort care approach.  Chest tube was removed 7/28.  Patient subsequently became more stable requiring less oxygen supplementation.  Palliative care was consulted and plan is for conservative measure of care and transition patient to rehab.   Currently patient is awaiting skilled nursing facility, closer to her family in [place]..   Assessment & Plan:   Principal Problem:   Acute hypoxemic respiratory failure (HCC) Active Problems:   Hypertension   Hemoptysis   Abnormal CXR   Leukocytosis   Subcutaneous emphysema (HCC)   Pneumoperitoneum of unknown etiology   Pneumothorax on  right    Acute Respiratory Failure in the setting of CAP and bilateral pulmonary infiltrates, left pneumothorax with pneumomediastinum: Hemoptysis, resolved Initially taken to the ICU intubated 7/23, extubated 7/25 - Left-sided chest tube placed 7/25, removed 7/28 - Repeat two-view chest x-ray concerns of slight pulmonary edema.  Lasix 20 mg IV asked ordered - Completed 10-day Unasyn course on 8/2. Procal - negative.  - PT recommended SNF.  Pending placement    Elevated troponin -Cardiology was  consulted. Elevation of troponin likely related to Respiratory failure Pneumothorax and pneumomediastinum, demand ischemia from CAD. - Echo 7/25-EF 60 to 65% - On metoprolol 12.5 mg twice daily  Dysphagia 3 diet - Speech and swallow eval, advance diet?   Malpositioned left IJ CTA neck 7/27 no acute or focal vascular injury or pseudoaneurysmal -Seen by vascular, removed    Essential hypertension -Currently on metoprolol.  Home CCB on hold   Acute urinary retention - Failed voiding trial.  Foley catheter reinserted 8/2  Seen by palliative care team   DVT prophylaxis: SCDs Start: 06/25/21 0310 Code Status: DNR Family Communication:  Called [name]; no answer.   Status is: Inpatient  Remains inpatient appropriate because:Unsafe d/c plan  Dispo: The patient is from: Home              Anticipated d/c is to: SNF              Patient currently is medically stable to d/c.   Difficult to place patient No       Nutritional status  Nutrition Problem: Inadequate oral intake Etiology: inability to eat  Signs/Symptoms: NPO status  Interventions: MVI, Ensure Enlive (each supplement provides  350kcal and 20 grams of protein), Other (Comment) (downgrade diet)  Body mass index is 34.96 kg/m.           Subjective: Sitting in the bed, no new complaints at this time.   Review of Systems Otherwise negative except as per HPI, including: General = no fevers, chills,  dizziness,  fatigue HEENT/EYES = negative for loss of vision, double vision, blurred vision,  sore throa Cardiovascular= negative for chest pain, palpitation Respiratory/lungs= negative for shortness of breath, cough, wheezing; hemoptysis,  Gastrointestinal= negative for nausea, vomiting, abdominal pain Genitourinary= negative for Dysuria MSK = Negative for arthralgia, myalgias Neurology= Negative for headache, numbness, tingling  Psychiatry= Negative for suicidal and homocidal ideation Skin= Negative for Rash   Examination: Constitutional: Not in acute distress Respiratory: minimal bibasilar crackles.  Cardiovascular: Normal sinus rhythm, no rubs Abdomen: Nontender nondistended good bowel sounds Musculoskeletal: No edema noted Skin: No rashes seen Neurologic: CN 2-12 grossly intact.  And nonfocal Psychiatric: Normal judgment and insight. Alert and oriented x 3. Normal mood.     Objective: Vitals:   07/06/21 0326 07/06/21 0511 07/06/21 0659 07/06/21 0719  BP: (!) 174/65 (!) 107/51  114/63  Pulse: 80 70  71  Resp: (!) '23 16  16  '$ Temp: 98.1 F (36.7 C)   98.7 F (37.1 C)  TempSrc: Oral   Oral  SpO2: 95% 96%  95%  Weight:   86.7 kg   Height:        Intake/Output Summary (Last 24 hours) at 07/06/2021 0758 Last data filed at 07/06/2021 0500 Gross per 24 hour  Intake 1011.12 ml  Output 800 ml  Net 211.12 ml   Filed Weights   07/04/21 0330 07/05/21 0345 07/06/21 0659  Weight: 83.9 kg 84.2 kg 86.7 kg     Data Reviewed:   CBC: Recent Labs  Lab 06/30/21 0050 07/03/21 0046 07/04/21 0243  WBC 12.7* 15.6* 13.1*  HGB 8.5* 8.1* 8.5*  HCT 24.6* 24.0* 25.5*  MCV 89.1 89.9 90.1  PLT 187 263 [id number]   Basic Metabolic Panel: Recent Labs  Lab 06/30/21 0050 07/01/21 0058 07/03/21 0046 07/04/21 0243  NA 133*  --  134* 134*  K 3.7  --  3.3* 3.3*  CL 99  --  100 99  CO2 26  --  27 25  GLUCOSE 87  --  86 90  BUN 29*  --  27* 26*  CREATININE 0.89  --  0.82 0.92  CALCIUM  8.6*  --  8.3* 8.5*  PHOS 3.3 2.9  --   --    GFR: Estimated Creatinine Clearance: 43.2 mL/min (by C-G formula based on SCr of 0.92 mg/dL). Liver Function Tests: No results for input(s): AST, ALT, ALKPHOS, BILITOT, PROT, ALBUMIN in the last 168 hours. No results for input(s): LIPASE, AMYLASE in the last 168 hours. No results for input(s): AMMONIA in the last 168 hours. Coagulation Profile: No results for input(s): INR, PROTIME in the last 168 hours. Cardiac Enzymes: No results for input(s): CKTOTAL, CKMB, CKMBINDEX, TROPONINI in the last 168 hours. BNP (last 3 results) No results for input(s): PROBNP in the last 8760 hours. HbA1C: No results for input(s): HGBA1C in the last 72 hours. CBG: Recent Labs  Lab 06/29/21 0855  GLUCAP 106*   Lipid Profile: No results for input(s): CHOL, HDL, LDLCALC, TRIG, CHOLHDL, LDLDIRECT in the last 72 hours. Thyroid Function Tests: No results for input(s): TSH, T4TOTAL, FREET4, T3FREE, THYROIDAB in the last 72 hours. Anemia Panel: No results for  input(s): VITAMINB12, FOLATE, FERRITIN, TIBC, IRON, RETICCTPCT in the last 72 hours. Sepsis Labs: Recent Labs  Lab 07/06/21 0207  PROCALCITON <0.10    Recent Results (from the past 240 hour(s))  Respiratory (~20 pathogens) panel by PCR     Status: None   Collection Time: 06/27/21  9:48 AM   Specimen: Nasopharyngeal Swab; Respiratory  Result Value Ref Range Status   Adenovirus NOT DETECTED NOT DETECTED Final   Coronavirus 229E NOT DETECTED NOT DETECTED Final    Comment: (NOTE) The Coronavirus on the Respiratory Panel, DOES NOT test for the novel  Coronavirus (2019 nCoV)    Coronavirus HKU1 NOT DETECTED NOT DETECTED Final   Coronavirus NL63 NOT DETECTED NOT DETECTED Final   Coronavirus OC43 NOT DETECTED NOT DETECTED Final   Metapneumovirus NOT DETECTED NOT DETECTED Final   Rhinovirus / Enterovirus NOT DETECTED NOT DETECTED Final   Influenza A NOT DETECTED NOT DETECTED Final   Influenza B NOT  DETECTED NOT DETECTED Final   Parainfluenza Virus 1 NOT DETECTED NOT DETECTED Final   Parainfluenza Virus 2 NOT DETECTED NOT DETECTED Final   Parainfluenza Virus 3 NOT DETECTED NOT DETECTED Final   Parainfluenza Virus 4 NOT DETECTED NOT DETECTED Final   Respiratory Syncytial Virus NOT DETECTED NOT DETECTED Final   Bordetella pertussis NOT DETECTED NOT DETECTED Final   Bordetella Parapertussis NOT DETECTED NOT DETECTED Final   Chlamydophila pneumoniae NOT DETECTED NOT DETECTED Final   Mycoplasma pneumoniae NOT DETECTED NOT DETECTED Final    Comment: Performed at [facility], [place]. [address] [place], [place] [address]         Radiology Studies: DG Chest 2 View  Result Date: 07/05/2021 CLINICAL DATA:  Follow-up pneumothorax. EXAM: CHEST - 2 VIEW COMPARISON:  06/29/2021 FINDINGS: Lucency in the right lung base shows interval improvement likely loculated sub pulmonic pneumothorax. Small residual right effusion. Progressive right upper lobe airspace disease with adjacent pleural thickening. Possible pneumonia. No pneumothorax on the left. Left lower lobe airspace disease with mild progression. Small left effusion. IMPRESSION: Improving right sub pulmonic pneumothorax Progression of right upper lobe infiltrate and pleural thickening possible pneumonia. Mild progression of left lower lobe airspace disease. Electronically Signed   By: Franchot Gallo M.D.   On: 07/05/2021 15:46        Scheduled Meds:  Chlorhexidine Gluconate Cloth  6 each Topical Daily   feeding supplement  237 mL Oral TID BM   furosemide  20 mg Intravenous Q8H   guaiFENesin  1,200 mg Oral BID   mouth rinse  15 mL Mouth Rinse BID   metoprolol tartrate  12.5 mg Oral BID   multivitamin with minerals  1 tablet Oral Daily   nystatin  5 mL Oral QID   pantoprazole  40 mg Oral BID   Continuous Infusions:  sodium chloride Stopped (06/27/21 0941)     LOS: 11 days   Time spent= 35 mins    Jesicca Dipierro Arsenio Loader, MD [facility]  If 7PM-7AM, please contact night-coverage  07/06/2021, 7:58 AM

## 2021-07-06 NOTE — Progress Notes (Signed)
Mobility Specialist: Progress Note   07/06/21 1735  Mobility  Activity Ambulated to bathroom  Level of Assistance Minimal assist, patient does 75% or more  Assistive Device Front wheel walker  Distance Ambulated (ft) 40 ft  Mobility Ambulated with assistance in room  Mobility Response Tolerated well  Mobility performed by Mobility specialist  $Mobility charge 1 Mobility   Pre-Mobility: 93 HR, 158/84 BP, 97% SpO2 Post-Mobility: 88 HR, 97% SpO2  Pt required minA for bed mobility as well as to stand. Pt to BR, BM successful. Pt back to bed after walk with call bell and phone at her side. Pt set-up with her dinner.   [facility] Manroop Jakubowicz Mobility Specialist Mobility Specialist Phone: [phone]

## 2021-07-06 NOTE — Care Management Important Message (Signed)
Important Message  Patient Details  Name: Mary Strickland MRN: [id number] Date of Birth: May 20, 1930   Medicare Important Message Given:  Yes     [name] 07/06/2021, 10:24 AM

## 2021-07-06 NOTE — TOC Progression Note (Signed)
Transition of Care [facility]) - Progression Note    Patient Details  Name: Zanaiyah Podesta MRN: [id number] Date of Birth: 04-12-1930  Transition of Care [facility]) CM/SW [place], [place] Phone Number: 07/06/2021, 3:50 PM  Clinical Narrative:     CSW called [facility]- they have made offer for rehab - [place] faxed updated clinicals for SNF authorization to # [phone]  Called to obtain Transportation cost: [facility]:  2,2710.00 [employer] Transportation: [phone] [facility]: unable to provide transportation because of oxygen needs.  CSW will continue to follow and assist with discharge planning.  Thurmond Butts, MSW, LCSW Clinical Social Worker    Expected Discharge Plan: Home/Self Care Barriers to Discharge: Continued Medical Work up  Expected Discharge Plan and Services Expected Discharge Plan: Home/Self Care In-house Referral: Clinical Social Work     Living arrangements for the past 2 months: Single Family Home Expected Discharge Date: 07/05/21                                     Social Determinants of Health (SDOH) Interventions    Readmission Risk Interventions No flowsheet data found.

## 2021-07-06 NOTE — Evaluation (Signed)
Clinical/Bedside Swallow Evaluation Patient Details  Name: Mary Strickland MRN: [id number] Date of Birth: 1930-10-02  Today's Date: 07/06/2021 Time: SLP Start Time (ACUTE ONLY): 54 SLP Stop Time (ACUTE ONLY): 1420 SLP Time Calculation (min) (ACUTE ONLY): 10 min  Past Medical History:  Past Medical History:  Diagnosis Date   Anxiety    Back pain    Blood loss anemia    Mild postoperative blood loss anemia   Dyslipidemia    GERD (gastroesophageal reflux disease)    History of diverticulitis of colon    Hypertension    Idiopathic scoliosis    Lumbosacral spondylosis    Mitral valve prolapse    Neuroma, Morton's    both feet,surgery done   Palpitations    hx. of   Psychosis (HCC)    Right carotid bruit    Ulcer    History of ulcers   Past Surgical History:  Past Surgical History:  Procedure Laterality Date   cataracts     both eyes   EXCISION MORTON'S NEUROMA     both feet   LUMBAR FUSION     OTHER SURGICAL HISTORY     hysterectomy   OTHER SURGICAL HISTORY     left breast biopsy/lymph node   TOTAL KNEE ARTHROPLASTY  October 2002   right   HPI:  87 year old prior history of smoking, hypertension, mitral valve prolapse who was admitted with acute onset hemoptysis.  In the ED, she was initially observed to be hemodynamically stable with no acute findings seen on chest x-ray.  While in the ED she had a sudden drop of oxygen saturation to the 80s and demonstrated altered mental status/confusion, prompting endotracheal intubation on 06/24/2021, CTA obtained post intubation revealed pneumomediastinum and left pneumothorax.  Patient was admitted by CCM, she had a left-sided chest tube placed on 7/25.  She was subsequently extubated on 7/25th.  She has been off of IV pressors.  CTA chest was negative for PE, showed extensive pneumomediastinum throughout the chest extending into the base of the neck, question a degree of pneumopericardium as well.   Assessment / Plan /  Recommendation Clinical Impression  Pt demonstrates no impairment with mastication. SHe does state that she likes her food cut up, but also complains about redundancy of meals. Recommend upgrade to unrestricted texture. Pt may need set up assist at times. Does well with hand held food. Will sign off. SLP Visit Diagnosis: Dysphagia, unspecified (R13.10)    Aspiration Risk  Mild aspiration risk    Diet Recommendation Regular;Thin liquid   Liquid Administration via: Straw;Cup Medication Administration: Whole meds with liquid Supervision: Patient able to self feed Compensations: Slow rate;Small sips/bites Postural Changes: Seated upright at 90 degrees    Other  Recommendations     Follow up Recommendations None      Frequency and Duration            Prognosis        Swallow Study   General HPI: 87 year old prior history of smoking, hypertension, mitral valve prolapse who was admitted with acute onset hemoptysis.  In the ED, she was initially observed to be hemodynamically stable with no acute findings seen on chest x-ray.  While in the ED she had a sudden drop of oxygen saturation to the 80s and demonstrated altered mental status/confusion, prompting endotracheal intubation on 06/24/2021, CTA obtained post intubation revealed pneumomediastinum and left pneumothorax.  Patient was admitted by CCM, she had a left-sided chest tube placed on 7/25.  She was subsequently  extubated on 7/25th.  She has been off of IV pressors.  CTA chest was negative for PE, showed extensive pneumomediastinum throughout the chest extending into the base of the neck, question a degree of pneumopericardium as well. Type of Study: Bedside Swallow Evaluation Previous Swallow Assessment: none Diet Prior to this Study: Dysphagia 3 (soft);Thin liquids Temperature Spikes Noted: No Respiratory Status: Nasal cannula History of Recent Intubation: No Behavior/Cognition: Alert;Cooperative;Pleasant mood Oral Cavity  Assessment: Within Functional Limits Oral Care Completed by SLP: No Oral Cavity - Dentition: Adequate natural dentition Vision: Functional for self-feeding Self-Feeding Abilities: Able to feed self Patient Positioning: Upright in bed Baseline Vocal Quality: Normal    Oral/Motor/Sensory Function Overall Oral Motor/Sensory Function: Within functional limits   Ice Chips     Thin Liquid Thin Liquid: Within functional limits    Nectar Thick Nectar Thick Liquid: Not tested   Honey Thick Honey Thick Liquid: Not tested   Puree Puree: Within functional limits   Solid     Solid: Within functional limits     Herbie Baltimore, MA [place] Pager [phone] Office [phone]  Lynann Beaver 07/06/2021,2:27 PM

## 2021-07-07 DIAGNOSIS — J9601 Acute respiratory failure with hypoxia: Secondary | ICD-10-CM | POA: Diagnosis not present

## 2021-07-07 LAB — BASIC METABOLIC PANEL
Anion gap: 7 (ref 5–15)
BUN: 19 mg/dL (ref 8–23)
CO2: 30 mmol/L (ref 22–32)
Calcium: 8.1 mg/dL — ABNORMAL LOW (ref 8.9–10.3)
Chloride: 98 mmol/L (ref 98–111)
Creatinine, Ser: 0.89 mg/dL (ref 0.44–1.00)
GFR, Estimated: >60 mL/min (ref >60)
Glucose, Bld: 105 mg/dL — ABNORMAL HIGH (ref 70–99)
Potassium: 3.4 mmol/L — ABNORMAL LOW (ref 3.5–5.1)
Sodium: 135 mmol/L (ref 135–145)

## 2021-07-07 LAB — MAGNESIUM: Magnesium: 1.9 mg/dL (ref 1.7–2.4)

## 2021-07-07 MED ORDER — POTASSIUM CHLORIDE CRYS ER 20 MEQ PO TBCR
40.0000 meq | EXTENDED_RELEASE_TABLET | Freq: Once | ORAL | Status: AC
  Administered 2021-07-07: 40 meq via ORAL
  Filled 2021-07-07: qty 2

## 2021-07-07 MED ORDER — FUROSEMIDE 10 MG/ML IJ SOLN
20.0000 mg | Freq: Three times a day (TID) | INTRAMUSCULAR | Status: AC
  Administered 2021-07-07 (×2): 20 mg via INTRAVENOUS
  Filled 2021-07-07 (×2): qty 2

## 2021-07-07 NOTE — TOC Progression Note (Signed)
Transition of Care [facility]) - Progression Note    Patient Details  Name: Mary Strickland MRN: [id number] Date of Birth: [birth year]  Transition of Care [facility]) CM/SW [place], [place] Phone Number: 07/07/2021, 2:21 PM  Clinical Narrative:     Insurance remains pending w/ [employer]( [place] office [phone] has Blair Hailey- can take 2-3 business days to get authorization.  CSW informed patient's granddaughter of  cost of Transportation- shed was agreeable to [phone]) - cost [phone]. CSW informed she must call and secure transportation that has been scheduled for 07/11/2021 @ 12pm- IF SNF has receives authorization for SNF.   Thurmond Butts, MSW, LCSW Clinical Social Worker    Expected Discharge Plan: Home/Self Care Barriers to Discharge: Continued Medical Work up  Expected Discharge Plan and Services Expected Discharge Plan: Home/Self Care In-house Referral: Clinical Social Work     Living arrangements for the past 2 months: Single Family Home Expected Discharge Date: 07/07/21                                     Social Determinants of Health (SDOH) Interventions    Readmission Risk Interventions No flowsheet data found.

## 2021-07-07 NOTE — Progress Notes (Signed)
Occupational Therapy Treatment Patient Details Name: Mary Strickland MRN: [id number] DOB: 1930-10-20 Today's Date: 07/07/2021    History of present illness 87 y.o. female presents to [place] [facility] ED on 06/24/2021 with sudden onset hemoptysis. Pt experienced a bout of hypoxia in ED with AMS and was intubated on 7/23. Pt was transported to [facility] hospital on 7/24, found to have pneumomediastinum, pneumothorax. Chest tube inserted 7/24. Pt extubated on 7/25. PMH includes history of smoking, hypertension, mitral valve prolapse.   OT comments  Pt progressing towards OT goals, able to ambulate with min guard assist short distances using RW. Pt continues to be limited by weakness and cardiopulmonary tolerance deficit, which impacts ability to complete ADLs standing at sink. Encouraged energy conservation strategies with performing ADLs seated and optimal breathing techniques. Pt remains motivated to participate and demo good rehab potential with SNF rehab.   SpO2 WFL on 5 L O2   Follow Up Recommendations  SNF    Equipment Recommendations  Other (comment) (Rolling walker)    Recommendations for Other Services      Precautions / Restrictions Precautions Precautions: Fall;Other (comment) Precaution Comments: monitor SpO2 Restrictions Weight Bearing Restrictions: No       Mobility Bed Mobility Overal bed mobility: Needs Assistance Bed Mobility: Sit to Supine     Supine to sit: Min assist Sit to supine: Supervision   General bed mobility comments: for line safety, no assist needed. able to use B UE to pull self up in bed with Mod A    Transfers Overall transfer level: Needs assistance Equipment used: Rolling walker (2 wheeled) Transfers: Sit to/from Stand Sit to Stand: Min guard         General transfer comment: min guard for power up with RW, no physical assist needed to achieve upright posture    Balance Overall balance assessment: Needs assistance Sitting-balance support:  No upper extremity supported;Feet supported Sitting balance-[name] Scale: Good     Standing balance support: Bilateral upper extremity supported;During functional activity Standing balance-[name] Scale: Poor Standing balance comment: reliant on RW                           ADL either performed or assessed with clinical judgement   ADL Overall ADL's : Needs assistance/impaired     Grooming: Set up;Sitting;Brushing hair Grooming Details (indicate cue type and reason): able to brush hair sitting at sink. OT assisted in putting hair in ponytail.                             Functional mobility during ADLs: Min guard;Rolling walker General ADL Comments: Pt progressing short distance mobility well using RW. Pt remains limited by decreased cardiopulmonary endurance and unable to stand long enough to complete ADLs at sink. improved independence when sitting to perform tasks     Vision   Vision Assessment?: No apparent visual deficits   Perception     Praxis      Cognition Arousal/Alertness: Awake/alert Behavior During Therapy: WFL for tasks assessed/performed Overall Cognitive Status: No family/caregiver present to determine baseline cognitive functioning                                 General Comments: WFL for basic tasks though reports she could not remember if she had lunch or not        Exercises  General Exercises - Lower Extremity Ankle Circles/Pumps: AROM;Both;10 reps;Seated Long Arc Quad: AROM;Both;10 reps;Seated Hip Flexion/Marching: AROM;Both;10 reps;Seated   Shoulder Instructions       General Comments Pt on 5 L O2 sustained > 93% during tasks    Pertinent Vitals/ Pain       Pain Assessment: No/denies pain  Home Living                                          Prior Functioning/Environment              Frequency  Min 2X/week        Progress Toward Goals  OT Goals(current goals can now be  found in the care plan section)  Progress towards OT goals: Progressing toward goals  Acute Rehab OT Goals Patient Stated Goal: get better OT Goal Formulation: With patient Time For Goal Achievement: 07/16/21 Potential to Achieve Goals: Fair ADL Goals Pt Will Perform Grooming: with supervision;standing Pt Will Perform Lower Body Bathing: with set-up;sit to/from stand Pt Will Perform Lower Body Dressing: with set-up;sit to/from stand Pt Will Transfer to Toilet: with supervision;ambulating;grab bars Pt Will Perform Toileting - Clothing Manipulation and hygiene: Independently Additional ADL Goal #1: Pt will tolerate OOB functional acutivity for at least 5 minutes to progress indep towards ADLs  Plan Discharge plan remains appropriate    Co-evaluation                 AM-PAC OT "6 Clicks" Daily Activity     Outcome Measure   Help from another person eating meals?: None Help from another person taking care of personal grooming?: A Little Help from another person toileting, which includes using toliet, bedpan, or urinal?: A Little Help from another person bathing (including washing, rinsing, drying)?: A Lot Help from another person to put on and taking off regular upper body clothing?: A Little Help from another person to put on and taking off regular lower body clothing?: A Lot 6 Click Score: 17    End of Session Equipment Utilized During Treatment: Gait belt;Rolling walker;Oxygen  OT Visit Diagnosis: Unsteadiness on feet (R26.81);Muscle weakness (generalized) (M62.81);Pain   Activity Tolerance Patient tolerated treatment well;Patient limited by fatigue   Patient Left in bed;with call bell/phone within reach   Nurse Communication Mobility status;Other (comment) (questioning if pt had lunch)        Time: [id number] OT Time Calculation (min): 29 min  Charges: OT General Charges $OT Visit: 1 Visit OT Treatments $Self Care/Home Management : 23-37 mins  Malachy Chamber,  OTR/L Acute Rehab Services Office: [phone]    Layla Maw 07/07/2021, 1:28 PM

## 2021-07-07 NOTE — Progress Notes (Signed)
   Palliative Medicine Inpatient Follow Up Note   Chart reviewed. It appears that Chanin will transition to [facility] today.   We will sign off presently as goals remain clear for rehabilitation and ideally improvements.  No Charge ______________________________________________________________________________________ [place] Team Team Cell Phone: [phone] Please utilize secure chat with additional questions, if there is no response within 30 minutes please call the above phone number  Palliative Medicine Team providers are available by phone from 7am to 7pm daily and can be reached through the team cell phone.  Should this patient require assistance outside of these hours, please call the patient's attending physician.

## 2021-07-07 NOTE — Progress Notes (Signed)
Physical Therapy Treatment Patient Details Name: Mary Strickland MRN: [id number] DOB: [birth year] Today's Date: 07/07/2021    History of Present Illness 87 y.o. female presents to [place] long ED on 06/24/2021 with sudden onset hemoptysis. Pt experienced a bout of hypoxia in ED with AMS and was intubated on 7/23. Pt was transported to [facility] hospital on 7/24, found to have pneumomediastinum, pneumothorax. Chest tube inserted 7/24. Pt extubated on 7/25. PMH includes history of smoking, hypertension, mitral valve prolapse.    PT Comments    Pt making gradual progress. She does have c/o dizziness but VSS - she later described as more weakness than dizziness.  She required 5 L O2 rest (6 with activity as 5 not option on tank).  Pt fearful of falling and requiring encouragement and close chair follow.  Continue to progress as able.     Follow Up Recommendations  SNF     Equipment Recommendations  Rolling walker with 5" wheels;3in1 (PT)    Recommendations for Other Services       Precautions / Restrictions Precautions Precautions: Fall;Other (comment) Precaution Comments: monitor SpO2    Mobility  Bed Mobility Overal bed mobility: Needs Assistance Bed Mobility: Supine to Sit     Supine to sit: Min assist          Transfers Overall transfer level: Needs assistance Equipment used: Rolling walker (2 wheeled) Transfers: Sit to/from Stand Sit to Stand: Min assist         General transfer comment: Performed x 2 with cues for hand placement and increased time to rise  Ambulation/Gait Ambulation/Gait assistance: Min assist;+2 safety/equipment Gait Distance (Feet): 30 Feet (2' to chair then 30' close chair follow) Assistive device: Rolling walker (2 wheeled) Gait Pattern/deviations: Decreased stride length;Step-to pattern;Trunk flexed Gait velocity: decreased   General Gait Details: Cues for RW proximity and provided close chair follow.  Pt with fear of falling and c/o weakness  - requriing encouragment and chair follow to walk.   Stairs             Wheelchair Mobility    Modified Rankin (Stroke Patients Only)       Balance Overall balance assessment: Needs assistance Sitting-balance support: No upper extremity supported;Feet supported Sitting balance-[name] Scale: Good     Standing balance support: Bilateral upper extremity supported;During functional activity Standing balance-[name] Scale: Poor Standing balance comment: reliant on RW                            Cognition Arousal/Alertness: Awake/alert Behavior During Therapy: WFL for tasks assessed/performed Overall Cognitive Status: Within Functional Limits for tasks assessed                                        Exercises General Exercises - Lower Extremity Ankle Circles/Pumps: AROM;Both;10 reps;Seated Long Arc Quad: AROM;Both;10 reps;Seated Hip Flexion/Marching: AROM;Both;10 reps;Seated    General Comments General comments (skin integrity, edema, etc.): Pt on 5 L O2 with sats 99%.  She had c/o mild dizziness when sitting - BP was stable.  When O2 switched to tank, tank was not on and sats down to 87%.  Turned on to 6 L and sats quickly returned to 99% - pt reports some improvement in dizziness.  But still states some dizziness but then says says its more just weak and fatigued.  Performed seated exercises, progressed to  standing and tx to chair, tolerating well so progressed to gait with chair follow.  O2 sats were stable at 97% on 6 L with activity and returned to 5 L rest with sats 99%.  Pt still will state dizzy at times but then just describes more weakness.  She was up in chair, alert, feet elevated, tray in front, and carrying on full conversation on phone with family.  Notified RN      Pertinent Vitals/Pain Pain Assessment: No/denies pain    Home Living                      Prior Function            PT Goals (current goals can now be found  in the care plan section) Acute Rehab PT Goals Patient Stated Goal: get better PT Goal Formulation: With patient Time For Goal Achievement: 07/16/21 Potential to Achieve Goals: Good Progress towards PT goals: Progressing toward goals    Frequency    Min 2X/week      PT Plan Current plan remains appropriate    Co-evaluation              AM-PAC PT "6 Clicks" Mobility   Outcome Measure  Help needed turning from your back to your side while in a flat bed without using bedrails?: A Little Help needed moving from lying on your back to sitting on the side of a flat bed without using bedrails?: A Little Help needed moving to and from a bed to a chair (including a wheelchair)?: A Little Help needed standing up from a chair using your arms (e.g., wheelchair or bedside chair)?: A Little Help needed to walk in hospital room?: A Little Help needed climbing 3-5 steps with a railing? : A Lot 6 Click Score: 17    End of Session Equipment Utilized During Treatment: Gait belt;Oxygen Activity Tolerance: Patient tolerated treatment well Patient left: in chair;with call bell/phone within reach;with chair alarm set Nurse Communication: Mobility status PT Visit Diagnosis: Other abnormalities of gait and mobility (R26.89);Muscle weakness (generalized) (M62.81)     Time: [id number] PT Time Calculation (min) (ACUTE ONLY): 33 min  Charges:  $Gait Training: 8-22 mins $Therapeutic Activity: 8-22 mins                     Abran Richard, PT Acute Rehab Services Pager [phone] [facility] 07/07/2021, 12:43 PM

## 2021-07-07 NOTE — Progress Notes (Signed)
PROGRESS NOTE    Vessie Schoenthaler  [id number] DOB: 12/05/1929 DOA: 06/24/2021 PCP: Burnard Bunting, MD   Brief Narrative:  87 year old prior history of smoking, hypertension, mitral valve prolapse who was admitted with acute onset hemoptysis.  In the ED, she was initially observed to be hemodynamically stable with no acute findings seen on chest x-ray.  While in the ED she had a sudden drop of oxygen saturation to the 80s and demonstrated altered mental status/confusion, prompting endotracheal intubation on 06/24/2021, CTA obtained post intubation revealed pneumomediastinum and left pneumothorax.  Patient was admitted by CCM, she had a left-sided chest tube placed on 7/25.  She was subsequently extubated on 7/25th.  She has been off of IV pressors.  CTA chest was negative for PE, showed extensive pneumomediastinum throughout the chest extending into the base of the neck, question a degree of pneumopericardium as well.  Patient care was transferred to [facility] on 7/28.   Patient was treated for acute respiratory failure in the setting of pneumonia and bilateral pulmonary infiltrates, left pneumothorax and pneumomediastinum.  She had a chest tube placed on 7/25.  Was transferred to triad service and subsequently developed hemoptysis and worsening hypoxemia.  CCM discussed with family and plan was for more comfort care approach.  Chest tube was removed 7/28.  Patient subsequently became more stable requiring less oxygen supplementation.  Palliative care was consulted and plan is for conservative measure of care and transition patient to rehab.   Currently patient is awaiting skilled nursing facility, closer to her family in [place]..   Assessment & Plan:   Principal Problem:   Acute hypoxemic respiratory failure (HCC) Active Problems:   Hypertension   Hemoptysis   Abnormal CXR   Leukocytosis   Subcutaneous emphysema (HCC)   Pneumoperitoneum of unknown etiology   Pneumothorax on  right    Acute Respiratory Failure in the setting of CAP and bilateral pulmonary infiltrates, left pneumothorax with pneumomediastinum: Hemoptysis, resolved Initially taken to the ICU intubated 7/23, extubated 7/25 - Left-sided chest tube placed 7/25, removed 7/28 - Lasix '20mg'$  iv x 2 doses today - Completed 10-day Unasyn course on 8/2. Procal - negative.  - PT recommended SNF.  Pending placement    Elevated troponin -Cardiology was  consulted. Elevation of troponin likely related to Respiratory failure Pneumothorax and pneumomediastinum, demand ischemia from CAD. - Echo 7/25-EF 60 to 65% - On metoprolol 12.5 mg twice daily  Dysphagia 3 diet - Speech and swallow eval, advance diet?   Malpositioned left IJ CTA neck 7/27 no acute or focal vascular injury or pseudoaneurysmal -Seen by vascular, removed    Essential hypertension -Currently on metoprolol.  Home CCB on hold   Acute urinary retention - Failed voiding trial.  Foley catheter reinserted 8/2  Seen by palliative care team   DVT prophylaxis: SCDs Start: 06/25/21 0310 Code Status: DNR Family Communication:  [name] updated.   Status is: Inpatient  Remains inpatient appropriate because:Unsafe d/c plan  Dispo: The patient is from: Home              Anticipated d/c is to: SNF              Patient currently is medically stable to d/c.   Difficult to place patient No       Nutritional status  Nutrition Problem: Inadequate oral intake Etiology: inability to eat  Signs/Symptoms: NPO status  Interventions: MVI, Ensure Enlive (each supplement provides 350kcal and 20 grams of protein), Other (Comment) (downgrade diet)  Body mass index is 34.96 kg/m.       Subjective: No complaints, feels ok.   Review of Systems Otherwise negative except as per HPI, including: General = no fevers, chills, dizziness,  fatigue HEENT/EYES = negative for loss of vision, double vision, blurred vision,  sore  throa Cardiovascular= negative for chest pain, palpitation Respiratory/lungs= negative for shortness of breath, cough, wheezing; hemoptysis,  Gastrointestinal= negative for nausea, vomiting, abdominal pain Genitourinary= negative for Dysuria MSK = Negative for arthralgia, myalgias Neurology= Negative for headache, numbness, tingling  Psychiatry= Negative for suicidal and homocidal ideation Skin= Negative for Rash   Examination: Constitutional: Not in acute distress; 3-4L [place] Respiratory: mild bibasilar crackles.  Cardiovascular: Normal sinus rhythm, no rubs Abdomen: Nontender nondistended good bowel sounds Musculoskeletal: No edema noted Skin: No rashes seen Neurologic: CN 2-12 grossly intact.  And nonfocal Psychiatric: Normal judgment and insight. Alert and oriented x 3. Normal mood.    Objective: Vitals:   07/06/21 1952 07/06/21 2318 07/07/21 0427 07/07/21 1130  BP: (!) 160/80 (!) 144/73 138/77 (!) 152/81  Pulse: 93 97 81 90  Resp: '20 20 20 20  '$ Temp: 99.3 F (37.4 C) 98.8 F (37.1 C) 98.4 F (36.9 C) 98.4 F (36.9 C)  TempSrc: Oral Oral Oral Oral  SpO2: 97% 96% 95% 98%  Weight:      Height:        Intake/Output Summary (Last 24 hours) at 07/07/2021 1210 Last data filed at 07/06/2021 2149 Gross per 24 hour  Intake 250 ml  Output 2900 ml  Net -2650 ml   Filed Weights   07/04/21 0330 07/05/21 0345 07/06/21 0659  Weight: 83.9 kg 84.2 kg 86.7 kg     Data Reviewed:   CBC: Recent Labs  Lab 07/03/21 0046 07/04/21 0243  WBC 15.6* 13.1*  HGB 8.1* 8.5*  HCT 24.0* 25.5*  MCV 89.9 90.1  PLT 263 [id number]   Basic Metabolic Panel: Recent Labs  Lab 07/01/21 0058 07/03/21 0046 07/04/21 0243 07/06/21 0207 07/07/21 0132  NA  --  134* 134* 135 135  K  --  3.3* 3.3* 4.3 3.4*  CL  --  100 99 102 98  CO2  --  '27 25 25 30  '$ GLUCOSE  --  86 90 143* 105*  BUN  --  27* 26* 21 19  CREATININE  --  0.82 0.92 1.01* 0.89  CALCIUM  --  8.3* 8.5* 8.0* 8.1*  MG  --   --   --  2.1  1.9  PHOS 2.9  --   --   --   --    GFR: Estimated Creatinine Clearance: 44.6 mL/min (by C-G formula based on SCr of 0.89 mg/dL). Liver Function Tests: No results for input(s): AST, ALT, ALKPHOS, BILITOT, PROT, ALBUMIN in the last 168 hours. No results for input(s): LIPASE, AMYLASE in the last 168 hours. No results for input(s): AMMONIA in the last 168 hours. Coagulation Profile: No results for input(s): INR, PROTIME in the last 168 hours. Cardiac Enzymes: No results for input(s): CKTOTAL, CKMB, CKMBINDEX, TROPONINI in the last 168 hours. BNP (last 3 results) No results for input(s): PROBNP in the last 8760 hours. HbA1C: No results for input(s): HGBA1C in the last 72 hours. CBG: No results for input(s): GLUCAP in the last 168 hours.  Lipid Profile: No results for input(s): CHOL, HDL, LDLCALC, TRIG, CHOLHDL, LDLDIRECT in the last 72 hours. Thyroid Function Tests: No results for input(s): TSH, T4TOTAL, FREET4, T3FREE, THYROIDAB in the last 72  hours. Anemia Panel: No results for input(s): VITAMINB12, FOLATE, FERRITIN, TIBC, IRON, RETICCTPCT in the last 72 hours. Sepsis Labs: Recent Labs  Lab 07/06/21 0207  PROCALCITON <0.10    No results found for this or any previous visit (from the past 240 hour(s)).        Radiology Studies: DG Chest 2 View  Result Date: 07/05/2021 CLINICAL DATA:  Follow-up pneumothorax. EXAM: CHEST - 2 VIEW COMPARISON:  06/29/2021 FINDINGS: Lucency in the right lung base shows interval improvement likely loculated sub pulmonic pneumothorax. Small residual right effusion. Progressive right upper lobe airspace disease with adjacent pleural thickening. Possible pneumonia. No pneumothorax on the left. Left lower lobe airspace disease with mild progression. Small left effusion. IMPRESSION: Improving right sub pulmonic pneumothorax Progression of right upper lobe infiltrate and pleural thickening possible pneumonia. Mild progression of left lower lobe airspace  disease. Electronically Signed   By: Franchot Gallo M.D.   On: 07/05/2021 15:46        Scheduled Meds:  Chlorhexidine Gluconate Cloth  6 each Topical Daily   feeding supplement  237 mL Oral TID BM   guaiFENesin  1,200 mg Oral BID   mouth rinse  15 mL Mouth Rinse BID   metoprolol tartrate  12.5 mg Oral BID   multivitamin with minerals  1 tablet Oral Daily   nystatin  5 mL Oral QID   pantoprazole  40 mg Oral BID   Continuous Infusions:  sodium chloride Stopped (06/27/21 0941)     LOS: 12 days   Time spent= 35 mins    Gumecindo Hopkin Arsenio Loader, MD [facility]  If 7PM-7AM, please contact night-coverage  07/07/2021, 12:10 PM

## 2021-07-08 DIAGNOSIS — J9601 Acute respiratory failure with hypoxia: Secondary | ICD-10-CM | POA: Diagnosis not present

## 2021-07-08 LAB — BASIC METABOLIC PANEL
Anion gap: 11 (ref 5–15)
BUN: 19 mg/dL (ref 8–23)
CO2: 27 mmol/L (ref 22–32)
Calcium: 8.5 mg/dL — ABNORMAL LOW (ref 8.9–10.3)
Chloride: 95 mmol/L — ABNORMAL LOW (ref 98–111)
Creatinine, Ser: 1 mg/dL (ref 0.44–1.00)
GFR, Estimated: 54 mL/min — ABNORMAL LOW (ref >60)
Glucose, Bld: 92 mg/dL (ref 70–99)
Potassium: 3.9 mmol/L (ref 3.5–5.1)
Sodium: 133 mmol/L — ABNORMAL LOW (ref 135–145)

## 2021-07-08 LAB — MAGNESIUM: Magnesium: 1.9 mg/dL (ref 1.7–2.4)

## 2021-07-08 NOTE — Progress Notes (Signed)
PROGRESS NOTE    Mary Strickland  [id number] DOB: 12/13/1929 DOA: 06/24/2021 PCP: Burnard Bunting, MD   Brief Narrative:  87 year old prior history of smoking, hypertension, mitral valve prolapse who was admitted with acute onset hemoptysis.  In the ED, she was initially observed to be hemodynamically stable with no acute findings seen on chest x-ray.  While in the ED she had a sudden drop of oxygen saturation to the 80s and demonstrated altered mental status/confusion, prompting endotracheal intubation on 06/24/2021, CTA obtained post intubation revealed pneumomediastinum and left pneumothorax.  Patient was admitted by CCM, she had a left-sided chest tube placed on 7/25.  She was subsequently extubated on 7/25th.  She has been off of IV pressors.  CTA chest was negative for PE, showed extensive pneumomediastinum throughout the chest extending into the base of the neck, question a degree of pneumopericardium as well.  Patient care was transferred to [facility] on 7/28.   Patient was treated for acute respiratory failure in the setting of pneumonia and bilateral pulmonary infiltrates, left pneumothorax and pneumomediastinum.  She had a chest tube placed on 7/25.  Was transferred to triad service and subsequently developed hemoptysis and worsening hypoxemia.  CCM discussed with family and plan was for more comfort care approach.  Chest tube was removed 7/28.  Patient subsequently became more stable requiring less oxygen supplementation.  Palliative care was consulted and plan is for conservative measure of care and transition patient to rehab.   Currently patient is awaiting skilled nursing facility, closer to her family in [place]..   Assessment & Plan:   Principal Problem:   Acute hypoxemic respiratory failure (HCC) Active Problems:   Hypertension   Hemoptysis   Abnormal CXR   Leukocytosis   Subcutaneous emphysema (HCC)   Pneumoperitoneum of unknown etiology   Pneumothorax on  right    Acute Respiratory Failure in the setting of CAP and bilateral pulmonary infiltrates, left pneumothorax with pneumomediastinum: Hemoptysis, resolved Initially taken to the ICU intubated 7/23, extubated 7/25 - Left-sided chest tube placed 7/25, removed 7/28 - Lasix '20mg'$  IV once today  - Completed 10-day Unasyn course on 8/2. Procal - negative.  - PT recommended SNF.  Has a place, pending insurance auth    Elevated troponin -Cardiology was  consulted. Elevation of troponin likely related to Respiratory failure Pneumothorax and pneumomediastinum, demand ischemia from CAD. - Echo 7/25-EF 60 to 65% - On metoprolol 12.5 mg twice daily   Malpositioned left IJ CTA neck 7/27 no acute or focal vascular injury or pseudoaneurysmal -Seen by vascular, removed    Essential hypertension -Currently on metoprolol.  Home CCB on hold   Acute urinary retention - Failed voiding trial.  Foley catheter reinserted 8/2. Will retry voiding trail.   Seen by palliative care team   DVT prophylaxis: SCDs Start: 06/25/21 0310 Code Status: DNR Family Communication:  Mary Strickland updated.   Status is: Inpatient  Remains inpatient appropriate because:Unsafe d/c plan  Dispo: The patient is from: Home              Anticipated d/c is to: SNF              Patient currently is medically stable to d/c. Pending Insurance Auth   Difficult to place patient No       Nutritional status  Nutrition Problem: Inadequate oral intake Etiology: inability to eat  Signs/Symptoms: NPO status  Interventions: MVI, Ensure Enlive (each supplement provides 350kcal and 20 grams of protein), Other (Comment) (downgrade diet)  Body mass index is 34.96 kg/m.       Subjective: Patient continues to ask me who I am every day, very forgetful but overall she is not any acute distress.  P.o. intake is somewhat adequate.  Review of Systems Otherwise negative except as per HPI, including: General: Denies fever,  chills, night sweats or unintended weight loss. Resp: Denies cough, wheezing, shortness of breath. Cardiac: Denies chest pain, palpitations, orthopnea, paroxysmal nocturnal dyspnea. GI: Denies abdominal pain, nausea, vomiting, diarrhea or constipation GU: Denies dysuria, frequency, hesitancy or incontinence MS: Denies muscle aches, joint pain or swelling Neuro: Denies headache, neurologic deficits (focal weakness, numbness, tingling), abnormal gait Psych: Denies anxiety, depression, SI/HI/AVH Skin: Denies new rashes or lesions ID: Denies sick contacts, exotic exposures, travel  Examination: Constitutional: Not in acute distress Respiratory: Clear to auscultation bilaterally Cardiovascular: Normal sinus rhythm, no rubs Abdomen: Nontender nondistended good bowel sounds Musculoskeletal: No edema noted Skin: No rashes seen Neurologic: CN 2-12 grossly intact.  And nonfocal Psychiatric: Poor judgment and insight.  Alert to name and place. Objective: Vitals:   07/07/21 2356 07/08/21 0458 07/08/21 0634 07/08/21 0804  BP: (!) 156/81 118/61  139/66  Pulse: 92 71  84  Resp: '20 19  20  '$ Temp: 98.6 F (37 C) 98.2 F (36.8 C)  98 F (36.7 C)  TempSrc: Oral Oral  Oral  SpO2: 92% 96%  96%  Weight:   86.7 kg   Height:        Intake/Output Summary (Last 24 hours) at 07/08/2021 0809 Last data filed at 07/08/2021 0806 Gross per 24 hour  Intake --  Output 3425 ml  Net -3425 ml   Filed Weights   07/05/21 0345 07/06/21 0659 07/08/21 0634  Weight: 84.2 kg 86.7 kg 86.7 kg     Data Reviewed:   CBC: Recent Labs  Lab 07/03/21 0046 07/04/21 0243  WBC 15.6* 13.1*  HGB 8.1* 8.5*  HCT 24.0* 25.5*  MCV 89.9 90.1  PLT 263 [id number]   Basic Metabolic Panel: Recent Labs  Lab 07/03/21 0046 07/04/21 0243 07/06/21 0207 07/07/21 0132 07/08/21 0112  NA 134* 134* 135 135 133*  K 3.3* 3.3* 4.3 3.4* 3.9  CL 100 99 102 98 95*  CO2 '27 25 25 30 27  '$ GLUCOSE 86 90 143* 105* 92  BUN 27* 26* '21 19 19   '$ CREATININE 0.82 0.92 1.01* 0.89 1.00  CALCIUM 8.3* 8.5* 8.0* 8.1* 8.5*  MG  --   --  2.1 1.9 1.9   GFR: Estimated Creatinine Clearance: 39.7 mL/min (by C-G formula based on SCr of 1 mg/dL). Liver Function Tests: No results for input(s): AST, ALT, ALKPHOS, BILITOT, PROT, ALBUMIN in the last 168 hours. No results for input(s): LIPASE, AMYLASE in the last 168 hours. No results for input(s): AMMONIA in the last 168 hours. Coagulation Profile: No results for input(s): INR, PROTIME in the last 168 hours. Cardiac Enzymes: No results for input(s): CKTOTAL, CKMB, CKMBINDEX, TROPONINI in the last 168 hours. BNP (last 3 results) No results for input(s): PROBNP in the last 8760 hours. HbA1C: No results for input(s): HGBA1C in the last 72 hours. CBG: No results for input(s): GLUCAP in the last 168 hours.  Lipid Profile: No results for input(s): CHOL, HDL, LDLCALC, TRIG, CHOLHDL, LDLDIRECT in the last 72 hours. Thyroid Function Tests: No results for input(s): TSH, T4TOTAL, FREET4, T3FREE, THYROIDAB in the last 72 hours. Anemia Panel: No results for input(s): VITAMINB12, FOLATE, FERRITIN, TIBC, IRON, RETICCTPCT in the last 72 hours.  Sepsis Labs: Recent Labs  Lab 07/06/21 0207  PROCALCITON <0.10    No results found for this or any previous visit (from the past 240 hour(s)).        Radiology Studies: No results found.      Scheduled Meds:  Chlorhexidine Gluconate Cloth  6 each Topical Daily   feeding supplement  237 mL Oral TID BM   guaiFENesin  1,200 mg Oral BID   mouth rinse  15 mL Mouth Rinse BID   metoprolol tartrate  12.5 mg Oral BID   multivitamin with minerals  1 tablet Oral Daily   nystatin  5 mL Oral QID   pantoprazole  40 mg Oral BID   Continuous Infusions:  sodium chloride Stopped (06/27/21 0941)     LOS: 13 days   Time spent= 35 mins    Mary Orourke Arsenio Loader, MD [facility]  If 7PM-7AM, please contact night-coverage  07/08/2021, 8:09 AM

## 2021-07-08 NOTE — Progress Notes (Signed)
Mobility Specialist: Progress Note   07/08/21 1416  Mobility  Activity Ambulated in hall  Level of Assistance Contact guard assist, steadying assist  Assistive Device Four wheel walker  Distance Ambulated (ft) 104 ft [phone]  Mobility Ambulated with assistance in hallway  Mobility Response Tolerated well  Mobility performed by Mobility specialist  $Mobility charge 1 Mobility   Pre-Mobility 3 L/min HFNC: 78 HR, 98% SpO2 During Mobility 6 L/min [place]: 92 HR, 96-100% SpO2 Post-Mobility 3 L/min HFNC: 85 HR, 143/58 BP, 92% SpO2  Pt stopped for two seated breaks d/t SOB and general fatigue lasting 1-2 minutes each, VSS throughout. Pt back to bed after walk with call bell at her side.   [facility] Adarrius Graeff Mobility Specialist Mobility Specialist Phone: [phone]

## 2021-07-08 NOTE — Plan of Care (Signed)
  Problem: Health Behavior/Discharge Planning: Goal: Ability to manage health-related needs will improve Outcome: Not Progressing   

## 2021-07-09 DIAGNOSIS — J9601 Acute respiratory failure with hypoxia: Secondary | ICD-10-CM | POA: Diagnosis not present

## 2021-07-09 LAB — BASIC METABOLIC PANEL
Anion gap: 11 (ref 5–15)
BUN: 17 mg/dL (ref 8–23)
CO2: 24 mmol/L (ref 22–32)
Calcium: 8.4 mg/dL — ABNORMAL LOW (ref 8.9–10.3)
Chloride: 97 mmol/L — ABNORMAL LOW (ref 98–111)
Creatinine, Ser: 0.96 mg/dL (ref 0.44–1.00)
GFR, Estimated: 57 mL/min — ABNORMAL LOW (ref >60)
Glucose, Bld: 111 mg/dL — ABNORMAL HIGH (ref 70–99)
Potassium: 4.3 mmol/L (ref 3.5–5.1)
Sodium: 132 mmol/L — ABNORMAL LOW (ref 135–145)

## 2021-07-09 LAB — MAGNESIUM: Magnesium: 2 mg/dL (ref 1.7–2.4)

## 2021-07-09 MED ORDER — FUROSEMIDE 10 MG/ML IJ SOLN
20.0000 mg | Freq: Once | INTRAMUSCULAR | Status: AC
  Administered 2021-07-09: 20 mg via INTRAVENOUS
  Filled 2021-07-09: qty 2

## 2021-07-09 NOTE — Progress Notes (Signed)
Mobility Specialist: Progress Note   07/09/21 1632  Mobility  Activity Ambulated in hall  Level of Assistance Contact guard assist, steadying assist  Assistive Device Front wheel walker  Distance Ambulated (ft) 50 ft  Mobility Ambulated with assistance in hallway  Mobility Response Tolerated well  Mobility performed by Mobility specialist  $Mobility charge 1 Mobility   Pre-Mobility 3 L/min HFNC: 94 HR, 150/67 BP, 98% SpO2 Post-Mobility on 6 L/min [place]: 102 HR, 188/79 BP, 97% SpO2  Pt required minA to sit EOB from supine but was contact guard to stand as well as during ambulation. Pt c/o feeling generally fatigued today but otherwise asx. Pt back to bed after walk with call bell and phone at her side.   [facility] Mary Strickland Mobility Specialist Mobility Specialist Phone: [phone]

## 2021-07-09 NOTE — Progress Notes (Signed)
Patient bladder scan '@0500'$  showed 417. Patient refusing to get up and attempt to use the [facility]. Patient informed staff that she didn't want to get up and to stop asking. Will continue to monitor

## 2021-07-09 NOTE — Progress Notes (Signed)
PROGRESS NOTE    Mary Strickland  [id number] DOB: 11-Jan-1930 DOA: 06/24/2021 PCP: Burnard Bunting, MD   Brief Narrative:  87 year old prior history of smoking, hypertension, mitral valve prolapse who was admitted with acute onset hemoptysis.  In the ED, she was initially observed to be hemodynamically stable with no acute findings seen on chest x-ray.  While in the ED she had a sudden drop of oxygen saturation to the 80s and demonstrated altered mental status/confusion, prompting endotracheal intubation on 06/24/2021, CTA obtained post intubation revealed pneumomediastinum and left pneumothorax.  Patient was admitted by CCM, she had a left-sided chest tube placed on 7/25.  She was subsequently extubated on 7/25th.  She has been off of IV pressors.  CTA chest was negative for PE, showed extensive pneumomediastinum throughout the chest extending into the base of the neck, question a degree of pneumopericardium as well.  Patient care was transferred to [facility] on 7/28.   Patient was treated for acute respiratory failure in the setting of pneumonia and bilateral pulmonary infiltrates, left pneumothorax and pneumomediastinum.  She had a chest tube placed on 7/25.  Was transferred to triad service and subsequently developed hemoptysis and worsening hypoxemia.  CCM discussed with family and plan was for more comfort care approach.  Chest tube was removed 7/28.  Patient subsequently became more stable requiring less oxygen supplementation.  Palliative care was consulted and plan is for conservative measure of care and transition patient to rehab.   Currently patient is awaiting skilled nursing facility, closer to her family in [place]..   Assessment & Plan:   Principal Problem:   Acute hypoxemic respiratory failure (HCC) Active Problems:   Hypertension   Hemoptysis   Abnormal CXR   Leukocytosis   Subcutaneous emphysema (HCC)   Pneumoperitoneum of unknown etiology   Pneumothorax on  right    Acute Respiratory Failure in the setting of CAP and bilateral pulmonary infiltrates, left pneumothorax with pneumomediastinum: Hemoptysis, resolved Initially taken to the ICU intubated 7/23, extubated 7/25. On 3L [place] - Left-sided chest tube placed 7/25, removed 7/28 - Lasix '20mg'$  IV once again today - Completed 10-day Unasyn course on 8/2. Procal - negative.  - PT recommended SNF.  Has a place, pending insurance Auth    Elevated troponin -Cardiology was  consulted. Elevation of troponin likely related to Respiratory failure Pneumothorax and pneumomediastinum, demand ischemia from CAD. - Echo 7/25-EF 60 to 65% - On metoprolol 12.5 mg twice daily   Malpositioned left IJ CTA neck 7/27 no acute or focal vascular injury or pseudoaneurysmal -Seen by vascular, removed    Essential hypertension -Currently on metoprolol.  Home CCB on hold   Acute urinary retention - Failed voiding trial.  Foley catheter reinserted 8/2. Foley removed 8/6. Periodic bladder scan  Seen by palliative care team   DVT prophylaxis: SCDs Start: 06/25/21 0310 Code Status: DNR Family Communication:  [name] updated periodically   Status is: Inpatient  Remains inpatient appropriate because:Unsafe d/c plan  Dispo: The patient is from: Home              Anticipated d/c is to: SNF              Patient currently is medically stable to d/c. Pending Insurance Auth   Difficult to place patient No       Nutritional status  Nutrition Problem: Inadequate oral intake Etiology: inability to eat  Signs/Symptoms: NPO status  Interventions: MVI, Ensure Enlive (each supplement provides 350kcal and 20 grams of protein),  Other (Comment) (downgrade diet)  Body mass index is 36.58 kg/m.       Subjective: Feels ok, no complains.    Examination: Constitutional: Not in acute distress Respiratory: Clear to auscultation bilaterally Cardiovascular: Normal sinus rhythm, no rubs Abdomen: Nontender  nondistended good bowel sounds Musculoskeletal: No edema noted Skin: No rashes seen Neurologic: CN 2-12 grossly intact.  And nonfocal Psychiatric: Normal judgment and insight. Alert and oriented x 2 (name and place). Normal mood.   Objective: Vitals:   07/08/21 1704 07/08/21 1926 07/08/21 2329 07/09/21 0407  BP: (!) 148/71 (!) 149/63 (!) 138/48 (!) 125/58  Pulse: 82 88 84 75  Resp: '19 18 20 20  '$ Temp: 98 F (36.7 C) 98.3 F (36.8 C) 99 F (37.2 C) 99 F (37.2 C)  TempSrc: Oral Oral Oral Oral  SpO2: 97% 100% 96% 95%  Weight:    90.7 kg  Height:        Intake/Output Summary (Last 24 hours) at 07/09/2021 0803 Last data filed at 07/08/2021 2130 Gross per 24 hour  Intake 480 ml  Output 1000 ml  Net -520 ml   Filed Weights   07/06/21 0659 07/08/21 0634 07/09/21 0407  Weight: 86.7 kg 86.7 kg 90.7 kg     Data Reviewed:   CBC: Recent Labs  Lab 07/03/21 0046 07/04/21 0243  WBC 15.6* 13.1*  HGB 8.1* 8.5*  HCT 24.0* 25.5*  MCV 89.9 90.1  PLT 263 123456   Basic Metabolic Panel: Recent Labs  Lab 07/04/21 0243 07/06/21 0207 07/07/21 0132 07/08/21 0112 07/09/21 0058  NA 134* 135 135 133* 132*  K 3.3* 4.3 3.4* 3.9 4.3  CL 99 102 98 95* 97*  CO2 '25 25 30 27 24  '$ GLUCOSE 90 143* 105* 92 111*  BUN 26* '21 19 19 17  '$ CREATININE 0.92 1.01* 0.89 1.00 0.96  CALCIUM 8.5* 8.0* 8.1* 8.5* 8.4*  MG  --  2.1 1.9 1.9 2.0   GFR: Estimated Creatinine Clearance: 42.4 mL/min (by C-G formula based on SCr of 0.96 mg/dL). Liver Function Tests: No results for input(s): AST, ALT, ALKPHOS, BILITOT, PROT, ALBUMIN in the last 168 hours. No results for input(s): LIPASE, AMYLASE in the last 168 hours. No results for input(s): AMMONIA in the last 168 hours. Coagulation Profile: No results for input(s): INR, PROTIME in the last 168 hours. Cardiac Enzymes: No results for input(s): CKTOTAL, CKMB, CKMBINDEX, TROPONINI in the last 168 hours. BNP (last 3 results) No results for input(s): PROBNP in the  last 8760 hours. HbA1C: No results for input(s): HGBA1C in the last 72 hours. CBG: No results for input(s): GLUCAP in the last 168 hours.  Lipid Profile: No results for input(s): CHOL, HDL, LDLCALC, TRIG, CHOLHDL, LDLDIRECT in the last 72 hours. Thyroid Function Tests: No results for input(s): TSH, T4TOTAL, FREET4, T3FREE, THYROIDAB in the last 72 hours. Anemia Panel: No results for input(s): VITAMINB12, FOLATE, FERRITIN, TIBC, IRON, RETICCTPCT in the last 72 hours. Sepsis Labs: Recent Labs  Lab 07/06/21 0207  PROCALCITON <0.10    No results found for this or any previous visit (from the past 240 hour(s)).        Radiology Studies: No results found.      Scheduled Meds:  Chlorhexidine Gluconate Cloth  6 each Topical Daily   feeding supplement  237 mL Oral TID BM   guaiFENesin  1,200 mg Oral BID   mouth rinse  15 mL Mouth Rinse BID   metoprolol tartrate  12.5 mg Oral BID  multivitamin with minerals  1 tablet Oral Daily   nystatin  5 mL Oral QID   pantoprazole  40 mg Oral BID   Continuous Infusions:  sodium chloride Stopped (06/27/21 0941)     LOS: 14 days   Time spent= 35 mins    Stephanie Littman Arsenio Loader, MD [facility]  If 7PM-7AM, please contact night-coverage  07/09/2021, 8:03 AM

## 2021-07-09 NOTE — Plan of Care (Signed)
  Problem: Clinical Measurements: Goal: Respiratory complications will improve Outcome: Progressing   Problem: Health Behavior/Discharge Planning: Goal: Ability to manage health-related needs will improve Outcome: Not Progressing   

## 2021-07-10 DIAGNOSIS — J9601 Acute respiratory failure with hypoxia: Secondary | ICD-10-CM | POA: Diagnosis not present

## 2021-07-10 LAB — SARS CORONAVIRUS 2 (TAT 6-24 HRS): SARS Coronavirus 2: POSITIVE — AB

## 2021-07-10 LAB — MAGNESIUM: Magnesium: 2 mg/dL (ref 1.7–2.4)

## 2021-07-10 LAB — BASIC METABOLIC PANEL
Anion gap: 8 (ref 5–15)
BUN: 18 mg/dL (ref 8–23)
CO2: 27 mmol/L (ref 22–32)
Calcium: 8.2 mg/dL — ABNORMAL LOW (ref 8.9–10.3)
Chloride: 96 mmol/L — ABNORMAL LOW (ref 98–111)
Creatinine, Ser: 0.94 mg/dL (ref 0.44–1.00)
GFR, Estimated: 58 mL/min — ABNORMAL LOW (ref >60)
Glucose, Bld: 105 mg/dL — ABNORMAL HIGH (ref 70–99)
Potassium: 3.8 mmol/L (ref 3.5–5.1)
Sodium: 131 mmol/L — ABNORMAL LOW (ref 135–145)

## 2021-07-10 MED ORDER — METOPROLOL TARTRATE 25 MG PO TABS: 12.5000 mg | ORAL_TABLET | Freq: Two times a day (BID) | ORAL | Status: DC

## 2021-07-10 MED ORDER — PANTOPRAZOLE SODIUM 40 MG PO TBEC: 40.0000 mg | DELAYED_RELEASE_TABLET | Freq: Two times a day (BID) | ORAL | Status: DC

## 2021-07-10 MED ORDER — FUROSEMIDE 10 MG/ML IJ SOLN
40.0000 mg | Freq: Once | INTRAMUSCULAR | Status: AC
  Administered 2021-07-10: 40 mg via INTRAVENOUS
  Filled 2021-07-10: qty 4

## 2021-07-10 MED ORDER — MELATONIN 3 MG PO TABS: 3.0000 mg | ORAL_TABLET | Freq: Every evening | ORAL | 0 refills | Status: DC | PRN

## 2021-07-10 NOTE — Progress Notes (Signed)
Nutrition Follow-up  DOCUMENTATION CODES:   Not applicable  INTERVENTION:   - Continue Ensure Enlive po TID, each supplement provides 350 kcal and 20 grams of protein  - Continue MVI with minerals daily  NUTRITION DIAGNOSIS:   Inadequate oral intake related to inability to eat as evidenced by NPO status.  Diet advanced   GOAL:   Patient will meet greater than or equal to 90% of their needs  Progressing  MONITOR:   PO intake, Supplement acceptance, Labs, Weight trends, I & O's  REASON FOR ASSESSMENT:   Ventilator    ASSESSMENT:   87 y.o. female who is a former smoker and has medical history of HTN, dyslipidemia, GERD, Morton's neuroma, idiopathic scoliosis, mitral valve prolapse, anxiety, psychosis, and diverticulitis. She presented to the ED via EMS d/t acute onset of hemoptysis. In the ED she was initially hemodynamically stable but then experienced sudden episode of O2 desaturation into the 80s and became altered with confusion. She was subsequently intubated in the ED.  7/25 - extubated 7/26 - chest tube placed for L pneumothorax, clear liquids, transferred to [facility] 7/27 - diet advanced to regular consistency   Appetite increasing slowly. Last six meal completions charted as 60%, 75%, 100%, 0%, 0%, and 100%. Patient had eggs with sausage for breakfast without complication. Taking Ensure 2-3 times daily. RD observed Ensure 50% at bedside.   Awaiting insurance authorization for SNF.    Admit weight: 73.5 kg Current weight: 91.6 kg   Medications: reviewed  Labs: Na 131 (L)   Diet Order:   Diet Order             Diet regular Room service appropriate? Yes with Assist; Fluid consistency: Thin  Diet effective now                   EDUCATION NEEDS:   Education needs have been addressed  Skin:  Skin Assessment: Reviewed RN Assessment  Last BM:  8/4  Height:   Ht Readings from Last 1 Encounters:  06/25/21 '5\' 2"'$  (1.575 m)    Weight:   Wt Readings  from Last 1 Encounters:  07/10/21 91.6 kg    Ideal Body Weight:  50 kg  BMI:  Body mass index is 36.95 kg/m.  Estimated Nutritional Needs:   Kcal:  1400-1600  Protein:  70-85 grams  Fluid:  >/= 1.5 L/day   Mariana Single MS, RD, LDN, CNSC Clinical Nutrition Pager listed in [place]

## 2021-07-10 NOTE — Progress Notes (Signed)
Mobility Specialist: Progress Note   07/10/21 1220  Mobility  Activity Transferred to/from [facility]  Level of Assistance Contact guard assist, steadying assist  Assistive Device Front wheel walker  Distance Ambulated (ft) 6 ft  Mobility Out of bed to chair with meals  Mobility Response Tolerated fair  Mobility performed by Mobility specialist  $Mobility charge 1 Mobility   Pre-Mobility: 86 HR, 124/92 BP, 97% SpO2 Post-Mobility: 84 HR, 97% SpO2  Pt agreeable to ambulate upon entering room. Pt requesting to use BR upon standing but was unable to make it. Pt assisted with pericare and then agreeable to walk. Pt stood second time and voided upon standing. Pt assisted to [facility] and assisted with pericare again. Pt requesting to go back to bed. Pt back in bed with call bell and phone at her side.   [facility] Mary Strickland Mobility Specialist Mobility Specialist Phone: [phone]

## 2021-07-10 NOTE — TOC Progression Note (Signed)
Transition of Care [facility]) - Progression Note    Patient Details  Name: Mary Strickland MRN: [id number] Date of Birth: [birth year]  Transition of Care [facility]) CM/SW [place], [place] Phone Number: 07/10/2021, 4:26 PM  Clinical Narrative:     SNF informed CSW, they have received insurance auth- CSW advised SNF will send discharge summary and covid test tomorrow. Patient transport is scheduled at 12 tomorrow.   CSW updated MD,RN and patient's granddaughter.   Thurmond Butts, MSW, LCSW Clinical Social Worker     Expected Discharge Plan: Home/Self Care Barriers to Discharge: Continued Medical Work up  Expected Discharge Plan and Services Expected Discharge Plan: Home/Self Care In-house Referral: Clinical Social Work     Living arrangements for the past 2 months: Single Family Home Expected Discharge Date: 07/07/21                                     Social Determinants of Health (SDOH) Interventions    Readmission Risk Interventions No flowsheet data found.

## 2021-07-10 NOTE — Progress Notes (Signed)
PROGRESS NOTE    Mary Strickland  [id number] DOB: 1930/04/20 DOA: 06/24/2021 PCP: Burnard Bunting, MD   Brief Narrative:  87 year old prior history of smoking, hypertension, mitral valve prolapse who was admitted with acute onset hemoptysis.  In the ED, she was initially observed to be hemodynamically stable with no acute findings seen on chest x-ray.  While in the ED she had a sudden drop of oxygen saturation to the 80s and demonstrated altered mental status/confusion, prompting endotracheal intubation on 06/24/2021, CTA obtained post intubation revealed pneumomediastinum and left pneumothorax.  Patient was admitted by CCM, she had a left-sided chest tube placed on 7/25.  She was subsequently extubated on 7/25th.  She has been off of IV pressors.  CTA chest was negative for PE, showed extensive pneumomediastinum throughout the chest extending into the base of the neck, question a degree of pneumopericardium as well.  Patient care was transferred to [facility] on 7/28.   Patient was treated for acute respiratory failure in the setting of pneumonia and bilateral pulmonary infiltrates, left pneumothorax and pneumomediastinum.  She had a chest tube placed on 7/25.  Was transferred to triad service and subsequently developed hemoptysis and worsening hypoxemia.  CCM discussed with family and plan was for more comfort care approach.  Chest tube was removed 7/28.  Patient subsequently became more stable requiring less oxygen supplementation.  Palliative care was consulted and plan is for conservative measure of care and transition patient to rehab.   Currently patient is awaiting skilled nursing facility, closer to her family in [place]..   Assessment & Plan:   Principal Problem:   Acute hypoxemic respiratory failure (HCC) Active Problems:   Hypertension   Hemoptysis   Abnormal CXR   Leukocytosis   Subcutaneous emphysema (HCC)   Pneumoperitoneum of unknown etiology   Pneumothorax on  right    Acute Respiratory Failure in the setting of CAP and bilateral pulmonary infiltrates, left pneumothorax with pneumomediastinum: Hemoptysis, resolved Initially taken to the ICU intubated 7/23, extubated 7/25. On 3L [place] - Left-sided chest tube placed 7/25, removed 7/28 - Lasix '20mg'$  IV once again today - Completed 10-day Unasyn course on 8/2. Procal - negative.  - PT recommended SNF.  Has placement, pending insurance authorization.    Elevated troponin -Cardiology was  consulted. Elevation of troponin likely related to Respiratory failure Pneumothorax and pneumomediastinum, demand ischemia from CAD. - Echo 7/25-EF 60 to 65% - On metoprolol 12.5 mg twice daily   Malpositioned left IJ CTA neck 7/27 no acute or focal vascular injury or pseudoaneurysmal -Seen by vascular, removed    Essential hypertension -Currently on metoprolol.  Home CCB on hold   Acute urinary retention - Failed voiding trial.  Foley catheter reinserted 8/2. Foley removed 8/6. Periodic bladder scan  Seen by palliative care team   DVT prophylaxis: SCDs Start: 06/25/21 0310 Code Status: DNR Family Communication:  [name] updated periodically   Status is: Inpatient  Remains inpatient appropriate because:Unsafe d/c plan  Dispo: The patient is from: Home              Anticipated d/c is to: SNF              Patient currently is medically stable to d/c. Pending Insurance Auth   Difficult to place patient No       Nutritional status  Nutrition Problem: Inadequate oral intake Etiology: inability to eat  Signs/Symptoms: NPO status  Interventions: MVI, Ensure Enlive (each supplement provides 350kcal and 20 grams of protein), Other (  Comment) (downgrade diet)  Body mass index is 36.95 kg/m.       Subjective: Doing okay no complaints.  Ate most of her breakfast this morning.   Examination: Constitutional: Not in acute distress Respiratory: Clear to auscultation bilaterally Cardiovascular:  Normal sinus rhythm, no rubs Abdomen: Nontender nondistended good bowel sounds Musculoskeletal: No edema noted Skin: No rashes seen Neurologic: CN 2-12 grossly intact.  And nonfocal Psychiatric: Normal judgment and insight. Alert and oriented x 2-name and place. Normal mood. Objective: Vitals:   07/09/21 2003 07/09/21 2326 07/10/21 0333 07/10/21 0811  BP: (!) 143/60 (!) 144/57 135/60 (!) 132/58  Pulse: 80 80 80 81  Resp: '18 20 20 15  '$ Temp: 99.3 F (37.4 C) 99.3 F (37.4 C) 99.3 F (37.4 C) 97.7 F (36.5 C)  TempSrc: Oral Oral Oral Oral  SpO2: 97% 97% 96% 100%  Weight:   91.6 kg   Height:        Intake/Output Summary (Last 24 hours) at 07/10/2021 1139 Last data filed at 07/10/2021 0956 Gross per 24 hour  Intake 238 ml  Output 110 ml  Net 128 ml   Filed Weights   07/08/21 0634 07/09/21 0407 07/10/21 0333  Weight: 86.7 kg 90.7 kg 91.6 kg     Data Reviewed:   CBC: Recent Labs  Lab 07/04/21 0243  WBC 13.1*  HGB 8.5*  HCT 25.5*  MCV 90.1  PLT 123456   Basic Metabolic Panel: Recent Labs  Lab 07/06/21 0207 07/07/21 0132 07/08/21 0112 07/09/21 0058 07/10/21 0138  NA 135 135 133* 132* 131*  K 4.3 3.4* 3.9 4.3 3.8  CL 102 98 95* 97* 96*  CO2 '25 30 27 24 27  '$ GLUCOSE 143* 105* 92 111* 105*  BUN '21 19 19 17 18  '$ CREATININE 1.01* 0.89 1.00 0.96 0.94  CALCIUM 8.0* 8.1* 8.5* 8.4* 8.2*  MG 2.1 1.9 1.9 2.0 2.0   GFR: Estimated Creatinine Clearance: 43.6 mL/min (by C-G formula based on SCr of 0.94 mg/dL). Liver Function Tests: No results for input(s): AST, ALT, ALKPHOS, BILITOT, PROT, ALBUMIN in the last 168 hours. No results for input(s): LIPASE, AMYLASE in the last 168 hours. No results for input(s): AMMONIA in the last 168 hours. Coagulation Profile: No results for input(s): INR, PROTIME in the last 168 hours. Cardiac Enzymes: No results for input(s): CKTOTAL, CKMB, CKMBINDEX, TROPONINI in the last 168 hours. BNP (last 3 results) No results for input(s): PROBNP in  the last 8760 hours. HbA1C: No results for input(s): HGBA1C in the last 72 hours. CBG: No results for input(s): GLUCAP in the last 168 hours.  Lipid Profile: No results for input(s): CHOL, HDL, LDLCALC, TRIG, CHOLHDL, LDLDIRECT in the last 72 hours. Thyroid Function Tests: No results for input(s): TSH, T4TOTAL, FREET4, T3FREE, THYROIDAB in the last 72 hours. Anemia Panel: No results for input(s): VITAMINB12, FOLATE, FERRITIN, TIBC, IRON, RETICCTPCT in the last 72 hours. Sepsis Labs: Recent Labs  Lab 07/06/21 0207  PROCALCITON <0.10    No results found for this or any previous visit (from the past 240 hour(s)).        Radiology Studies: No results found.      Scheduled Meds:  Chlorhexidine Gluconate Cloth  6 each Topical Daily   feeding supplement  237 mL Oral TID BM   guaiFENesin  1,200 mg Oral BID   mouth rinse  15 mL Mouth Rinse BID   metoprolol tartrate  12.5 mg Oral BID   multivitamin with minerals  1 tablet  Oral Daily   nystatin  5 mL Oral QID   pantoprazole  40 mg Oral BID   Continuous Infusions:  sodium chloride Stopped (06/27/21 0941)     LOS: 15 days   Time spent= 35 mins    Bret Stamour Arsenio Loader, MD [facility]  If 7PM-7AM, please contact night-coverage  07/10/2021, 11:39 AM

## 2021-07-10 NOTE — Plan of Care (Signed)
°  Problem: Clinical Measurements: °Goal: Respiratory complications will improve °Outcome: Progressing °Goal: Cardiovascular complication will be avoided °Outcome: Progressing °  °Problem: Activity: °Goal: Risk for activity intolerance will decrease °Outcome: Progressing °  °

## 2021-07-11 DIAGNOSIS — J9601 Acute respiratory failure with hypoxia: Secondary | ICD-10-CM | POA: Diagnosis not present

## 2021-07-11 MED ORDER — IPRATROPIUM-ALBUTEROL 20-100 MCG/ACT IN AERS
1.0000 | INHALATION_SPRAY | Freq: Four times a day (QID) | RESPIRATORY_TRACT | Status: DC
Start: 2021-07-11 — End: 2021-07-17
  Administered 2021-07-11 – 2021-07-17 (×20): 1 via RESPIRATORY_TRACT
  Filled 2021-07-11 (×2): qty 4

## 2021-07-11 MED ORDER — ZINC SULFATE 220 (50 ZN) MG PO CAPS
220.0000 mg | ORAL_CAPSULE | Freq: Every day | ORAL | Status: DC
  Administered 2021-07-11 – 2021-08-03 (×24): 220 mg via ORAL
  Filled 2021-07-11 (×24): qty 1

## 2021-07-11 MED ORDER — GUAIFENESIN-DM 100-10 MG/5ML PO SYRP
10.0000 mL | ORAL_SOLUTION | ORAL | Status: DC | PRN
  Administered 2021-07-11 – 2021-07-28 (×11): 10 mL via ORAL
  Filled 2021-07-11 (×13): qty 10

## 2021-07-11 MED ORDER — DEXAMETHASONE 2 MG PO TABS
6.0000 mg | ORAL_TABLET | ORAL | Status: AC
  Administered 2021-07-11 – 2021-07-19 (×9): 6 mg via ORAL
  Filled 2021-07-11 (×2): qty 3
  Filled 2021-07-11: qty 1
  Filled 2021-07-11: qty 3
  Filled 2021-07-11: qty 1
  Filled 2021-07-11: qty 3
  Filled 2021-07-11 (×3): qty 1
  Filled 2021-07-11: qty 3

## 2021-07-11 MED ORDER — SODIUM CHLORIDE 0.9 % IV SOLN
200.0000 mg | Freq: Once | INTRAVENOUS | Status: AC
  Administered 2021-07-11: 200 mg via INTRAVENOUS
  Filled 2021-07-11: qty 40

## 2021-07-11 MED ORDER — HYDROCOD POLST-CPM POLST ER 10-8 MG/5ML PO SUER
5.0000 mL | Freq: Two times a day (BID) | ORAL | Status: DC | PRN
Start: 2021-07-11 — End: 2021-08-03
  Administered 2021-07-13 – 2021-08-02 (×17): 5 mL via ORAL
  Filled 2021-07-11 (×17): qty 5

## 2021-07-11 MED ORDER — SODIUM CHLORIDE 0.9 % IV SOLN
100.0000 mg | Freq: Every day | INTRAVENOUS | Status: AC
  Administered 2021-07-12 – 2021-07-15 (×4): 100 mg via INTRAVENOUS
  Filled 2021-07-11: qty 100
  Filled 2021-07-11 (×4): qty 20

## 2021-07-11 MED ORDER — ASCORBIC ACID 500 MG PO TABS
500.0000 mg | ORAL_TABLET | Freq: Every day | ORAL | Status: DC
  Administered 2021-07-11 – 2021-08-03 (×24): 500 mg via ORAL
  Filled 2021-07-11 (×24): qty 1

## 2021-07-11 NOTE — Care Management Important Message (Signed)
Important Message  Patient Details  Name: Mary Strickland MRN: [id number] Date of Birth: 01-15-1930   Medicare Important Message Given:  Yes     [name] 07/11/2021, 9:42 AM

## 2021-07-11 NOTE — Progress Notes (Signed)
PROGRESS NOTE    Mary Strickland  [id number] DOB: 13-Jan-1930 DOA: 06/24/2021 PCP: Burnard Bunting, MD   Brief Narrative:  87 year old prior history of smoking, hypertension, mitral valve prolapse who was admitted with acute onset hemoptysis.  In the ED, she was initially observed to be hemodynamically stable with no acute findings seen on chest x-ray.  While in the ED she had a sudden drop of oxygen saturation to the 80s and demonstrated altered mental status/confusion, prompting endotracheal intubation on 06/24/2021, CTA obtained post intubation revealed pneumomediastinum and left pneumothorax.  Patient was admitted by CCM, she had a left-sided chest tube placed on 7/25.  She was subsequently extubated on 7/25th.  She has been off of IV pressors.  CTA chest was negative for PE, showed extensive pneumomediastinum throughout the chest extending into the base of the neck, question a degree of pneumopericardium as well.  Patient care was transferred to [facility] on 7/28.   Patient was treated for acute respiratory failure in the setting of pneumonia and bilateral pulmonary infiltrates, left pneumothorax and pneumomediastinum.  She had a chest tube placed on 7/25.  Was transferred to triad service and subsequently developed hemoptysis and worsening hypoxemia.  CCM discussed with family and plan was for more comfort care approach.  Chest tube was removed 7/28.  Patient subsequently became more stable requiring less oxygen supplementation.  Palliative care was consulted and plan is for conservative measure of care and transition patient to rehab. While awaiting placement, her screening COVID test returned positive.  She was coughing with mild infiltrate and slight hypoxia therefore started on routine treatment remdesivir, steroids and bronchodilators.   Assessment & Plan:   Principal Problem:   Acute hypoxemic respiratory failure (HCC) Active Problems:   Hypertension   Hemoptysis   Abnormal CXR    Leukocytosis   Subcutaneous emphysema (HCC)   Pneumoperitoneum of unknown etiology   Pneumothorax on right    Acute Respiratory Failure in the setting of CAP and bilateral pulmonary infiltrates, left pneumothorax with pneumomediastinum: Hemoptysis, resolved COVID-19 pneumonia, diagnosed 8/8 Initially taken to the ICU intubated 7/23, extubated 7/25.  3 L nasal cannula - Left-sided chest tube placed 7/25, removed 7/28 - Start remdesivir for 5 days, Decadron, albuterol, incentive spirometer, flutter valve.  Vitamin C and zinc ordered.  Mucinex as needed - Completed 10-day Unasyn course on 8/2. Procal - negative.  - PT recommended SNF.  Cannot be transferred until 8/19 now    Elevated troponin -Cardiology was  consulted. Elevation of troponin likely related to Respiratory failure Pneumothorax and pneumomediastinum, demand ischemia from CAD. - Echo 7/25-EF 60 to 65% - On metoprolol 12.5 mg twice daily   Malpositioned left IJ CTA neck 7/27 no acute or focal vascular injury or pseudoaneurysmal -Seen by vascular, removed    Essential hypertension -Currently on metoprolol.  Home CCB on hold   Acute urinary retention - Failed voiding trial.  Foley catheter reinserted 8/2. Foley removed 8/6. Periodic bladder scan  Seen by palliative care team   DVT prophylaxis: SCDs Start: 06/25/21 0310 Code Status: DNR Family Communication: We will update granddaughter  Status is: Inpatient  Remains inpatient appropriate because:Unsafe d/c plan  Dispo: The patient is from: Home              Anticipated d/c is to: SNF              Patient currently not medically ready #she ended up being diagnosed with COVID-19 pneumonia while awaiting transfer   Difficult to  place patient No       Nutritional status  Nutrition Problem: Inadequate oral intake Etiology: inability to eat  Signs/Symptoms: NPO status  Interventions: MVI, Ensure Enlive (each supplement provides 350kcal and 20 grams of  protein), Other (Comment) (downgrade diet)  Body mass index is 37.03 kg/m.       Subjective: While awaiting transfer on yesterday screening.  She has diagnosed with COVID-19 pneumonia.  This morning she has some rhonchorous breath sounds requiring 3 L nasal cannula.  Examination: Constitutional: Not in acute distress, 3 L nasal cannula Respiratory: Bilateral coarse breath sounds Cardiovascular: Normal sinus rhythm, no rubs Abdomen: Nontender nondistended good bowel sounds Musculoskeletal: No edema noted Skin: No rashes seen Neurologic: CN 2-12 grossly intact.  And nonfocal Psychiatric: Alert to name and place which is her baseline.  Poor judgment and insight. Objective: Vitals:   07/10/21 2000 07/10/21 2337 07/11/21 0321 07/11/21 0823  BP: 113/70 (!) 130/56 (!) 119/44 (!) 122/52  Pulse: 94 72 75 75  Resp: '16 19 20 18  '$ Temp: 99.8 F (37.7 C) 99.8 F (37.7 C) 99 F (37.2 C) 100 F (37.8 C)  TempSrc: Oral Oral Oral Oral  SpO2: 95% 95% 96% 95%  Weight:   91.8 kg   Height:        Intake/Output Summary (Last 24 hours) at 07/11/2021 1312 Last data filed at 07/10/2021 2339 Gross per 24 hour  Intake --  Output 1400 ml  Net -1400 ml   Filed Weights   07/09/21 0407 07/10/21 0333 07/11/21 0321  Weight: 90.7 kg 91.6 kg 91.8 kg     Data Reviewed:   CBC: No results for input(s): WBC, NEUTROABS, HGB, HCT, MCV, PLT in the last 168 hours.  Basic Metabolic Panel: Recent Labs  Lab 07/06/21 0207 07/07/21 0132 07/08/21 0112 07/09/21 0058 07/10/21 0138  NA 135 135 133* 132* 131*  K 4.3 3.4* 3.9 4.3 3.8  CL 102 98 95* 97* 96*  CO2 '25 30 27 24 27  '$ GLUCOSE 143* 105* 92 111* 105*  BUN '21 19 19 17 18  '$ CREATININE 1.01* 0.89 1.00 0.96 0.94  CALCIUM 8.0* 8.1* 8.5* 8.4* 8.2*  MG 2.1 1.9 1.9 2.0 2.0   GFR: Estimated Creatinine Clearance: 43.6 mL/min (by C-G formula based on SCr of 0.94 mg/dL). Liver Function Tests: No results for input(s): AST, ALT, ALKPHOS, BILITOT, PROT,  ALBUMIN in the last 168 hours. No results for input(s): LIPASE, AMYLASE in the last 168 hours. No results for input(s): AMMONIA in the last 168 hours. Coagulation Profile: No results for input(s): INR, PROTIME in the last 168 hours. Cardiac Enzymes: No results for input(s): CKTOTAL, CKMB, CKMBINDEX, TROPONINI in the last 168 hours. BNP (last 3 results) No results for input(s): PROBNP in the last 8760 hours. HbA1C: No results for input(s): HGBA1C in the last 72 hours. CBG: No results for input(s): GLUCAP in the last 168 hours.  Lipid Profile: No results for input(s): CHOL, HDL, LDLCALC, TRIG, CHOLHDL, LDLDIRECT in the last 72 hours. Thyroid Function Tests: No results for input(s): TSH, T4TOTAL, FREET4, T3FREE, THYROIDAB in the last 72 hours. Anemia Panel: No results for input(s): VITAMINB12, FOLATE, FERRITIN, TIBC, IRON, RETICCTPCT in the last 72 hours. Sepsis Labs: Recent Labs  Lab 07/06/21 0207  PROCALCITON <0.10    Recent Results (from the past 240 hour(s))  SARS CORONAVIRUS 2 (TAT 6-24 HRS) Nasopharyngeal Nasopharyngeal Swab     Status: Abnormal   Collection Time: 07/10/21  1:36 PM   Specimen: Nasopharyngeal Swab  Result Value Ref Range Status   SARS Coronavirus 2 POSITIVE (A) NEGATIVE Final    Comment: (NOTE) SARS-CoV-2 target nucleic acids are DETECTED.  The SARS-CoV-2 RNA is generally detectable in upper and lower respiratory specimens during the acute phase of infection. Positive results are indicative of the presence of SARS-CoV-2 RNA. Clinical correlation with patient history and other diagnostic information is  necessary to determine patient infection status. Positive results do not rule out bacterial infection or co-infection with other viruses.  The expected result is Negative.  Fact Sheet for Patients: SugarRoll.be  Fact Sheet for Healthcare Providers: https://www.woods-mathews.com/  This test is not yet approved  or cleared by the [place] FDA and  has been authorized for detection and/or diagnosis of SARS-CoV-2 by FDA under an Emergency Use Authorization (EUA). This EUA will remain  in effect (meaning this test can be used) for the duration of the COVID-19 declaration under Section 564(b)(1) of the Act, 21 U. S.C. section 360bbb-3(b)(1), unless the authorization is terminated or revoked sooner.   Performed at [facility], [place] [address] [place], [place] [address]           Radiology Studies: No results found.      Scheduled Meds:  vitamin C  500 mg Oral Daily   Chlorhexidine Gluconate Cloth  6 each Topical Daily   dexamethasone  6 mg Oral Q24H   feeding supplement  237 mL Oral TID BM   guaiFENesin  1,200 mg Oral BID   Ipratropium-Albuterol  1 puff Inhalation Q6H   mouth rinse  15 mL Mouth Rinse BID   metoprolol tartrate  12.5 mg Oral BID   multivitamin with minerals  1 tablet Oral Daily   nystatin  5 mL Oral QID   pantoprazole  40 mg Oral BID   zinc sulfate  220 mg Oral Daily   Continuous Infusions:  sodium chloride Stopped (06/27/21 0941)   remdesivir 200 mg in sodium chloride 0.9% 250 mL IVPB     Followed by   Derrill Memo ON 07/12/2021] remdesivir 100 mg in NS 100 mL       LOS: 16 days   Time spent= 35 mins    Frederico Gerling Arsenio Loader, MD [facility]  If 7PM-7AM, please contact night-coverage  07/11/2021, 1:12 PM

## 2021-07-11 NOTE — Progress Notes (Signed)
Physical Therapy Treatment Patient Details Name: Mary Strickland MRN: [id number] DOB: 02/09/1930 Today's Date: 07/11/2021    History of Present Illness 87 y.o. female presents to [place] [facility] ED on 06/24/2021 with sudden onset hemoptysis. Pt experienced a bout of hypoxia in ED with AMS and was intubated on 7/23. Pt was transported to [facility] hospital on 7/24, found to have pneumomediastinum, pneumothorax. Chest tube inserted 7/24. Pt extubated on 7/25. Now covid+ as of 07/10/21. PMH includes history of smoking, hypertension, mitral valve prolapse.    PT Comments    Pt received in supine, agreeable to therapy session and with good participation and tolerance for bed mobility and transfer training. Pt c/o "weakness" with standing exercises/transfers and deferred gait trial but able to perform multiple seated/supine and reciprocal sit<>stands for LE strengthening with 5/10 modified RPE (fatigue) score at end of session. Pt continues to benefit from PT services to progress toward functional mobility goals. Plan to progress gait distance next session if pt agreeable.   Follow Up Recommendations  SNF     Equipment Recommendations  Rolling walker with 5" wheels;3in1 (PT)    Recommendations for Other Services       Precautions / Restrictions Precautions Precautions: Fall;Other (comment) Precaution Comments: Airborne/Contact precs (Covid+ 8/8) Restrictions Weight Bearing Restrictions: No    Mobility  Bed Mobility Overal bed mobility: Needs Assistance Bed Mobility: Supine to Sit;Sit to Supine     Supine to sit: Min guard;HOB elevated Sit to supine: Min guard   General bed mobility comments: cues for self-assist, pt needing BUE on bed rails to raise trunk    Transfers Overall transfer level: Needs assistance Equipment used: Rolling walker (2 wheeled) Transfers: Sit to/from Stand Sit to Stand: Min guard;Min assist         General transfer comment: min guard for power up to RW, minA  for steadying without AD once in stance  Ambulation/Gait Ambulation/Gait assistance: Min assist Gait Distance (Feet): 6 Feet Assistive device: Rolling walker (2 wheeled) Gait Pattern/deviations: Decreased stride length;Step-to pattern;Trunk flexed     General Gait Details: Cues for RW proximity and posture; limited distance due to pt c/o weakness and needing to sit so instead focus on seated/standing therex        Balance Overall balance assessment: Needs assistance Sitting-balance support: No upper extremity supported;Feet supported Sitting balance-[name] Scale: Good     Standing balance support: Bilateral upper extremity supported;During functional activity Standing balance-[name] Scale: Poor Standing balance comment: reliant on RW for dynamic standing tasks, need minA for static standing with U UE support of bed rail           Cognition Arousal/Alertness: Awake/alert Behavior During Therapy: WFL for tasks assessed/performed Overall Cognitive Status: No family/caregiver present to determine baseline cognitive functioning            General Comments: WFL for basic tasks though perseverating on needing sleeping pills; very HoH and some memory deficits noted (asking multiple times for therapist name, etc).      Exercises General Exercises - Lower Extremity Ankle Circles/Pumps: AROM;Both;10 reps;Seated Heel Slides: AROM;Both;10 reps;Supine Hip Flexion/Marching: AROM;Both;Seated;20 reps;10 reps (x10 reps standing at RW, x20 reps seated EOB (30 total)) Other Exercises Other Exercises: STS x 5 reciprocal reps from EOB to RW    General Comments General comments (skin integrity, edema, etc.): BP 119/66 (81) supine prior to mobility; BP 158/77 (99) seated EOB and c/o "weakness"; RR 16-20's rpm and HR 96-110 bpm with exertion. Temp 99.9* F  Pertinent Vitals/Pain Pain Assessment: No/denies pain     PT Goals (current goals can now be found in the care plan section)  Acute Rehab PT Goals Patient Stated Goal: get better PT Goal Formulation: With patient Time For Goal Achievement: 07/16/21 Progress towards PT goals: Progressing toward goals    Frequency    Min 2X/week      PT Plan Current plan remains appropriate       AM-PAC PT "6 Clicks" Mobility   Outcome Measure  Help needed turning from your back to your side while in a flat bed without using bedrails?: A Little Help needed moving from lying on your back to sitting on the side of a flat bed without using bedrails?: A Little Help needed moving to and from a bed to a chair (including a wheelchair)?: A Little Help needed standing up from a chair using your arms (e.g., wheelchair or bedside chair)?: A Little Help needed to walk in hospital room?: A Little Help needed climbing 3-5 steps with a railing? : Total 6 Click Score: 16    End of Session Equipment Utilized During Treatment: Gait belt;Oxygen Activity Tolerance: Patient limited by fatigue Patient left: in bed;with call bell/phone within reach;with bed alarm set (in air bed) Nurse Communication: Mobility status (slightly elevated temp 99.50F) PT Visit Diagnosis: Other abnormalities of gait and mobility (R26.89);Muscle weakness (generalized) (M62.81)     Time: [id number] PT Time Calculation (min) (ACUTE ONLY): 26 min  Charges:  $Therapeutic Exercise: 8-22 mins $Therapeutic Activity: 8-22 mins                     Elica Almas P., PTA Acute Rehabilitation Services Pager: [phone] Office: [place] 07/11/2021, 6:03 PM

## 2021-07-11 NOTE — TOC Progression Note (Signed)
Transition of Care [facility]) - Progression Note    Patient Details  Name: Mary Strickland MRN: [id number] Date of Birth: 03/27/1930  [facility]) CM/SW [place],  Phone Number: 07/11/2021, 9:23 AM  Clinical Narrative:     CSW informed by RN patient tested positive for covid- CSW called [facility] and [facility] to cancelled transportation. Patient can not discharge to SNF until 07/21/2021.  CSW will continue to follow and assists with discharge planning.  Thurmond Butts, MSW, LCSW Clinical Social Worker    Expected Discharge Plan: Home/Self Care Barriers to Discharge: Continued Medical Work up  Expected Discharge Plan and Services Expected Discharge Plan: Home/Self Care In-house Referral: Clinical Social Work     Living arrangements for the past 2 months: Single Family Home Expected Discharge Date: 07/07/21                                     Social Determinants of Health (SDOH) Interventions    Readmission Risk Interventions No flowsheet data found.

## 2021-07-12 DIAGNOSIS — J9601 Acute respiratory failure with hypoxia: Secondary | ICD-10-CM | POA: Diagnosis not present

## 2021-07-12 LAB — COMPREHENSIVE METABOLIC PANEL
ALT: 27 U/L (ref 0–44)
AST: 37 U/L (ref 15–41)
Albumin: 2.7 g/dL — ABNORMAL LOW (ref 3.5–5.0)
Alkaline Phosphatase: 33 U/L — ABNORMAL LOW (ref 38–126)
Anion gap: 8 (ref 5–15)
BUN: 22 mg/dL (ref 8–23)
CO2: 26 mmol/L (ref 22–32)
Calcium: 8.4 mg/dL — ABNORMAL LOW (ref 8.9–10.3)
Chloride: 96 mmol/L — ABNORMAL LOW (ref 98–111)
Creatinine, Ser: 0.95 mg/dL (ref 0.44–1.00)
GFR, Estimated: 58 mL/min — ABNORMAL LOW (ref >60)
Glucose, Bld: 129 mg/dL — ABNORMAL HIGH (ref 70–99)
Potassium: 4 mmol/L (ref 3.5–5.1)
Sodium: 130 mmol/L — ABNORMAL LOW (ref 135–145)
Total Bilirubin: 0.7 mg/dL (ref 0.3–1.2)
Total Protein: 6.6 g/dL (ref 6.5–8.1)

## 2021-07-12 LAB — CBC WITH DIFFERENTIAL/PLATELET
Abs Immature Granulocytes: 0.04 10*3/uL (ref 0.00–0.07)
Basophils Absolute: 0 10*3/uL (ref 0.0–0.1)
Basophils Relative: 0 %
Eosinophils Absolute: 0 10*3/uL (ref 0.0–0.5)
Eosinophils Relative: 0 %
HCT: 28.9 % — ABNORMAL LOW (ref 36.0–46.0)
Hemoglobin: 9.3 g/dL — ABNORMAL LOW (ref 12.0–15.0)
Immature Granulocytes: 1 %
Lymphocytes Relative: 15 %
Lymphs Abs: 0.7 10*3/uL (ref 0.7–4.0)
MCH: 30.1 pg (ref 26.0–34.0)
MCHC: 32.2 g/dL (ref 30.0–36.0)
MCV: 93.5 fL (ref 80.0–100.0)
Monocytes Absolute: 0.2 10*3/uL (ref 0.1–1.0)
Monocytes Relative: 5 %
Neutro Abs: 3.7 10*3/uL (ref 1.7–7.7)
Neutrophils Relative %: 79 %
Platelets: 300 10*3/uL (ref 150–400)
RBC: 3.09 MIL/uL — ABNORMAL LOW (ref 3.87–5.11)
RDW: 15.3 % (ref 11.5–15.5)
WBC: 4.7 10*3/uL (ref 4.0–10.5)
nRBC: 0 % (ref 0.0–0.2)

## 2021-07-12 LAB — D-DIMER, QUANTITATIVE: D-Dimer, Quant: 5.8 ug/mL-FEU — ABNORMAL HIGH (ref 0.00–0.50)

## 2021-07-12 LAB — C-REACTIVE PROTEIN: CRP: 12.1 mg/dL — ABNORMAL HIGH (ref <1.0)

## 2021-07-12 NOTE — Progress Notes (Signed)
PROGRESS NOTE    Mary Strickland   [id number]  DOB: 14-Jan-1930  PCP: Burnard Bunting, MD    DOA: 06/24/2021 LOS: 17   Assessment & Plan   Principal Problem:   Acute hypoxemic respiratory failure (HCC) Active Problems:   Hypertension   Hemoptysis   Abnormal CXR   Leukocytosis   Subcutaneous emphysema (HCC)   Pneumoperitoneum of unknown etiology   Pneumothorax on right   Acute Respiratory Failure in the setting of CAP and bilateral pulmonary infiltrates, left pneumothorax with pneumomediastinum: Hemoptysis, resolved COVID-19 pneumonia, diagnosed 8/8 Initially taken to the ICU intubated 7/23, extubated 7/25.   On 2 L/min oxygen - Left-sided chest tube placed 7/25, removed 7/28 - Continue Remdesivir x 5 days, Decadron, albuterol, incentive spirometer, flutter valve.   --Vitamin C and zinc ordered.   --Mucinex as needed - Completed 10-day Unasyn course on 8/2.  - PT recommended SNF.   Cannot be transferred until 8/19 now due to isolation    Elevated troponin -Cardiology was  consulted. Elevation of troponin likely related to Respiratory failure Pneumothorax and pneumomediastinum, demand ischemia from CAD. - Echo 7/25-EF 60 to 65% - On metoprolol 12.5 mg twice daily   Malpositioned left IJ CTA neck 7/27 no acute or focal vascular injury or pseudoaneurysmal -Seen by vascular, removed    Essential hypertension -Currently on metoprolol.  Home CCB on hold   Acute urinary retention - Failed voiding trial.  Foley catheter reinserted 8/2. Foley removed 8/6. Periodic bladder scan   Seen by palliative care team  Obesity: Body mass index is 37.02 kg/m. Complicates overall care and prognosis.  Recommend lifestyle modifications including physical activity and diet for weight loss and overall long-term health.    DVT prophylaxis: SCDs Start: 06/25/21 0310   Diet:  Diet Orders (From admission, onward)     Start     Ordered   07/07/21 1725  Diet regular Room  service appropriate? Yes with Assist; Fluid consistency: Thin  Diet effective now       Question Answer Comment  Room service appropriate? Yes with Assist   Fluid consistency: Thin      07/07/21 1725              Code Status: DNR   Brief Narrative / Hospital Course to Date:   87 year old prior history of smoking, hypertension, mitral valve prolapse who was admitted with acute onset hemoptysis.  In the ED, she was initially hemodynamically stable with no acute findings seen on chest x-ray.  While in the ED she had a sudden drop of oxygen saturation to the 80s and demonstrated altered mental status/confusion, prompting endotracheal intubation on 06/24/2021.  CTA obtained post intubation revealed pneumomediastinum and left pneumothorax.  Patient was admitted by [facility], she had a left-sided chest tube placed on 7/25.  She was subsequently extubated on 7/25.  She has been off of IV pressors.  CTA chest was negative for PE, showed extensive pneumomediastinum throughout the chest extending into the base of the neck, question a degree of pneumopericardium as well.   Patient care was transferred to [facility] on 7/28 and subsequently developed hemoptysis and worsening hypoxemia.  PCCM discussed with family and plan was for more comfort care approach.  Chest tube was removed 7/28.  Patient subsequently became more stable requiring less oxygen supplementation.    Palliative care was consulted and plan is for conservative measure of care and transition patient to rehab. While awaiting placement, her screening COVID test returned positive.  She  was coughing with mild infiltrate and slight hypoxia therefore started on routine treatment remdesivir, steroids and bronchodilators.  Subjective 07/12/21    Pt feels well today. Denies any cough, SOB, sore throat or other complaints.  Mentions family live in [place] and hopes to go to a rehab facility near them.     Disposition Plan & Communication   Status is:  Inpatient  Remains inpatient appropriate because:   On treatment for Covid-19 and requires isolation prior to SNF d/c.  Dispo: The patient is from: Home              Anticipated d/c is to: SNF              Patient currently is not medically stable to d/c.   Difficult to place patient No    Consults, Procedures, Significant Events   Consultants:  PCCM  Procedures:  Intubation, extubation Chest tube placement, removal  Antimicrobials:  Anti-infectives (From admission, onward)    Start     Dose/Rate Route Frequency Ordered Stop   07/12/21 1000  remdesivir 100 mg in sodium chloride 0.9 % 100 mL IVPB       See Hyperspace for full Linked Orders Report.   100 mg 200 mL/hr over 30 Minutes Intravenous Daily 07/11/21 1221 07/16/21 0959   07/11/21 1315  remdesivir 200 mg in sodium chloride 0.9% 250 mL IVPB       See Hyperspace for full Linked Orders Report.   200 mg 580 mL/hr over 30 Minutes Intravenous Once 07/11/21 1221 07/11/21 1722   06/27/21 1400  Ampicillin-Sulbactam (UNASYN) 3 g in sodium chloride 0.9 % 100 mL IVPB        3 g 200 mL/hr over 30 Minutes Intravenous Every 8 hours 06/27/21 0820 07/05/21 2356   06/26/21 1600  Ampicillin-Sulbactam (UNASYN) 3 g in sodium chloride 0.9 % 100 mL IVPB  Status:  Discontinued        3 g 200 mL/hr over 30 Minutes Intravenous Every 12 hours 06/26/21 1114 06/27/21 0820   06/25/21 0500  meropenem (MERREM) 1 g in sodium chloride 0.9 % 100 mL IVPB  Status:  Discontinued        1 g 200 mL/hr over 30 Minutes Intravenous Every 12 hours 06/25/21 0427 06/26/21 1114   06/25/21 0400  levofloxacin (LEVAQUIN) IVPB 750 mg  Status:  Discontinued        750 mg 100 mL/hr over 90 Minutes Intravenous Every 48 hours 06/25/21 0314 06/25/21 0427   06/25/21 0400  ceFEPIme (MAXIPIME) 2 g in sodium chloride 0.9 % 100 mL IVPB  Status:  Discontinued        2 g 200 mL/hr over 30 Minutes Intravenous Every 12 hours 06/25/21 0337 06/25/21 0427         Micro     Objective   Vitals:   07/11/21 2339 07/12/21 0411 07/12/21 0801 07/12/21 1230  BP: (!) 99/46 119/63 (!) 142/75 (!) 137/51  Pulse: 63 64 75 87  Resp: '17 20 18 19  '$ Temp: 99 F (37.2 C) 98.7 F (37.1 C) 98 F (36.7 C) 98.2 F (36.8 C)  TempSrc: Oral Oral Oral Oral  SpO2: 96% 98% 98% 92%  Weight:  91.8 kg    Height:        Intake/Output Summary (Last 24 hours) at 07/12/2021 1348 Last data filed at 07/12/2021 0900 Gross per 24 hour  Intake 120 ml  Output 300 ml  Net -180 ml   Filed Weights   07/10/21  [id number] 07/11/21 0321 07/12/21 0411  Weight: 91.6 kg 91.8 kg 91.8 kg    Physical Exam:  General exam: awake, alert, no acute distress HEENT: atraumatic, clear conjunctiva, anicteric sclera, moist mucus membranes, hard of hearing  Respiratory system: CTAB, no wheezes, rales or rhonchi, normal respiratory effort. Cardiovascular system: normal S1/S2, RRR, no pedal edema.   Gastrointestinal system: soft, NT, ND3 Central nervous system: A&O x3. no gross focal neurologic deficits, normal speech Extremities: moves all, no edema, normal tone Skin: dry, intact, normal temperature Psychiatry: normal mood, congruent affect, judgement and insight appear normal  Labs   Data Reviewed: I have personally reviewed following labs and imaging studies  CBC: Recent Labs  Lab 07/12/21 0203  WBC 4.7  NEUTROABS 3.7  HGB 9.3*  HCT 28.9*  MCV 93.5  PLT [id number]   Basic Metabolic Panel: Recent Labs  Lab 07/06/21 0207 07/07/21 0132 07/08/21 0112 07/09/21 0058 07/10/21 0138 07/12/21 0203  NA 135 135 133* 132* 131* 130*  K 4.3 3.4* 3.9 4.3 3.8 4.0  CL 102 98 95* 97* 96* 96*  CO2 '25 30 27 24 27 26  '$ GLUCOSE 143* 105* 92 111* 105* 129*  BUN '21 19 19 17 18 22  '$ CREATININE 1.01* 0.89 1.00 0.96 0.94 0.95  CALCIUM 8.0* 8.1* 8.5* 8.4* 8.2* 8.4*  MG 2.1 1.9 1.9 2.0 2.0  --    GFR: Estimated Creatinine Clearance: 43.2 mL/min (by C-G formula based on SCr of 0.95 mg/dL). Liver Function  Tests: Recent Labs  Lab 07/12/21 0203  AST 37  ALT 27  ALKPHOS 33*  BILITOT 0.7  PROT 6.6  ALBUMIN 2.7*   No results for input(s): LIPASE, AMYLASE in the last 168 hours. No results for input(s): AMMONIA in the last 168 hours. Coagulation Profile: No results for input(s): INR, PROTIME in the last 168 hours. Cardiac Enzymes: No results for input(s): CKTOTAL, CKMB, CKMBINDEX, TROPONINI in the last 168 hours. BNP (last 3 results) No results for input(s): PROBNP in the last 8760 hours. HbA1C: No results for input(s): HGBA1C in the last 72 hours. CBG: No results for input(s): GLUCAP in the last 168 hours. Lipid Profile: No results for input(s): CHOL, HDL, LDLCALC, TRIG, CHOLHDL, LDLDIRECT in the last 72 hours. Thyroid Function Tests: No results for input(s): TSH, T4TOTAL, FREET4, T3FREE, THYROIDAB in the last 72 hours. Anemia Panel: No results for input(s): VITAMINB12, FOLATE, FERRITIN, TIBC, IRON, RETICCTPCT in the last 72 hours. Sepsis Labs: Recent Labs  Lab 07/06/21 0207  PROCALCITON <0.10    Recent Results (from the past 240 hour(s))  SARS CORONAVIRUS 2 (TAT 6-24 HRS) Nasopharyngeal Nasopharyngeal Swab     Status: Abnormal   Collection Time: 07/10/21  1:36 PM   Specimen: Nasopharyngeal Swab  Result Value Ref Range Status   SARS Coronavirus 2 POSITIVE (A) NEGATIVE Final    Comment: (NOTE) SARS-CoV-2 target nucleic acids are DETECTED.  The SARS-CoV-2 RNA is generally detectable in upper and lower respiratory specimens during the acute phase of infection. Positive results are indicative of the presence of SARS-CoV-2 RNA. Clinical correlation with patient history and other diagnostic information is  necessary to determine patient infection status. Positive results do not rule out bacterial infection or co-infection with other viruses.  The expected result is Negative.  Fact Sheet for Patients: SugarRoll.be  Fact Sheet for Healthcare  Providers: https://www.woods-mathews.com/  This test is not yet approved or cleared by the [place] FDA and  has been authorized for detection and/or diagnosis of SARS-CoV-2 by FDA  under an Emergency Use Authorization (EUA). This EUA will remain  in effect (meaning this test can be used) for the duration of the COVID-19 declaration under Section 564(b)(1) of the Act, 21 U. S.C. section 360bbb-3(b)(1), unless the authorization is terminated or revoked sooner.   Performed at [facility], [place] [address] [place], [place] [address]       Imaging Studies   No results found.   Medications   Scheduled Meds:  vitamin C  500 mg Oral Daily   Chlorhexidine Gluconate Cloth  6 each Topical Daily   dexamethasone  6 mg Oral Q24H   feeding supplement  237 mL Oral TID BM   guaiFENesin  1,200 mg Oral BID   Ipratropium-Albuterol  1 puff Inhalation Q6H   mouth rinse  15 mL Mouth Rinse BID   metoprolol tartrate  12.5 mg Oral BID   multivitamin with minerals  1 tablet Oral Daily   nystatin  5 mL Oral QID   pantoprazole  40 mg Oral BID   zinc sulfate  220 mg Oral Daily   Continuous Infusions:  sodium chloride 500 mL (07/11/21 1647)   remdesivir 100 mg in NS 100 mL         LOS: 17 days    Time spent: 30 minutes    Ezekiel Slocumb, DO [facility]  07/12/2021, 1:48 PM      If 7PM-7AM, please contact night-coverage. How to contact the [facility] Attending or Consulting provider [place] or covering provider during after hours [place], for this patient?    Check the care team in [facility] and look for a) attending/consulting TR[facility] provider listed and b) the [facility] team listed Log into [email or web address] and use [place]'s universal password to access. If you do not have the password, please contact the hospital operator. Locate the [facility] provider you are looking for under [facility] and page to a number that you can be directly reached. If you still have  difficulty reaching the provider, please page the [facility] (Director on Call) for the Hospitalists listed on amion for assistance.

## 2021-07-12 NOTE — Progress Notes (Signed)
Occupational Therapy Treatment Patient Details Name: Mary Strickland MRN: [id number] DOB: [birth year] Today's Date: 07/12/2021    History of present illness 87 y.o. female presents to [place] [facility] ED on 06/24/2021 with sudden onset hemoptysis. Pt experienced a bout of hypoxia in ED with AMS and was intubated on 7/23. Pt was transported to [facility] on 7/24, found to have pneumomediastinum, pneumothorax. Chest tube inserted 7/24. Pt extubated on 7/25. Now covid+ as of 07/10/21. PMH includes history of smoking, hypertension, mitral valve prolapse.   OT comments  Pt with gradual progression towards OT goals. Pt able to complete UE HEP with good balance EOB and stable vitals on 3 L O2. Encouraged pt to complete exercises daily and encouraged pt to reference handout. Pt overall Supervision for bed mobility though declined mobility or sitting up in chair for breakfast this AM due to not feeling awake yet. Encouraged OOB activities with staff assist throughout day to maximize pulmonary health, skin integrity and strength. Continue to recommend SNF rehab at [place].    Follow Up Recommendations  SNF    Equipment Recommendations  Other (comment) (Rolling walker)    Recommendations for Other Services      Precautions / Restrictions Precautions Precautions: Fall;Other (comment) Precaution Comments: Airborne/Contact precs (Covid+ 8/8) Restrictions Weight Bearing Restrictions: No       Mobility Bed Mobility Overal bed mobility: Needs Assistance Bed Mobility: Supine to Sit;Sit to Supine     Supine to sit: Supervision;HOB elevated Sit to supine: Supervision   General bed mobility comments: supervision/assist for lines, able to pull self up in bed    Transfers                 General transfer comment: declined to get up in chair or mobilize this AM    Balance Overall balance assessment: Needs assistance Sitting-balance support: No upper extremity supported;Feet supported Sitting  balance-[name] Scale: Good Sitting balance - Comments: during UE HEP EOB                                   ADL either performed or assessed with clinical judgement   ADL Overall ADL's : Needs assistance/impaired Eating/Feeding: Set up;Sitting Eating/Feeding Details (indicate cue type and reason): assist to open containers, apply jelly to toast, etc Grooming: Set up;Sitting;Wash/dry face Grooming Details (indicate cue type and reason): and apply lip balm, noted cracked and bleeding lips                               General ADL Comments: Session focused on UE HEP education. Pt declined sitting up in chair or mobility this AM.     Vision   Vision Assessment?: No apparent visual deficits   Perception     Praxis      Cognition Arousal/Alertness: Awake/alert Behavior During Therapy: WFL for tasks assessed/performed Overall Cognitive Status: No family/caregiver present to determine baseline cognitive functioning                                 General Comments: WFL for basic tasks though noted with some increased confusion and difficulty problem solving, safety techniques, etc        Exercises Exercises: General Upper Extremity General Exercises - Upper Extremity Shoulder Flexion: Strengthening;5 reps;15 reps;Seated;Theraband Theraband Level (Shoulder Flexion): Level 1 (Yellow)  Shoulder Horizontal ABduction: Strengthening;Both;15 reps;Seated;Theraband Theraband Level (Shoulder Horizontal Abduction): Level 1 (Yellow) Elbow Flexion: Strengthening;Both;15 reps;Seated;Theraband Theraband Level (Elbow Flexion): Level 1 (Yellow) Elbow Extension: Strengthening;Both;15 reps;Seated;Theraband Theraband Level (Elbow Extension): Level 1 (Yellow)   Shoulder Instructions       General Comments HR, O2 WFL on 3 L O2. Encouraged pt to complete UE HEP again this afternoon though pt perseverating on need for someone to come in and tell her to do  exercises though cued to use handout and theraband around bedrail as cue to improve self-sufficient behaviors    Pertinent Vitals/ Pain       Pain Assessment: No/denies pain  Home Living                                          Prior Functioning/Environment              Frequency  Min 2X/week        Progress Toward Goals  OT Goals(current goals can now be found in the care plan section)  Progress towards OT goals: Progressing toward goals  Acute Rehab OT Goals Patient Stated Goal: get better OT Goal Formulation: With patient Time For Goal Achievement: 07/16/21 Potential to Achieve Goals: Fair ADL Goals Pt Will Perform Grooming: with supervision;standing Pt Will Perform Lower Body Bathing: with set-up;sit to/from stand Pt Will Perform Lower Body Dressing: with set-up;sit to/from stand Pt Will Transfer to Toilet: with supervision;ambulating;grab bars Pt Will Perform Toileting - Clothing Manipulation and hygiene: Independently Additional ADL Goal #1: Pt will tolerate OOB functional acutivity for at least 5 minutes to progress indep towards ADLs  Plan Discharge plan remains appropriate    Co-evaluation                 AM-PAC OT "6 Clicks" Daily Activity     Outcome Measure   Help from another person eating meals?: None Help from another person taking care of personal grooming?: A Little Help from another person toileting, which includes using toliet, bedpan, or urinal?: A Little Help from another person bathing (including washing, rinsing, drying)?: A Lot Help from another person to put on and taking off regular upper body clothing?: A Little Help from another person to put on and taking off regular lower body clothing?: A Lot 6 Click Score: 17    End of Session Equipment Utilized During Treatment: Oxygen  OT Visit Diagnosis: Unsteadiness on feet (R26.81);Muscle weakness (generalized) (M62.81);Pain   Activity Tolerance Patient tolerated  treatment well   Patient Left in bed;with call bell/phone within reach   Nurse Communication Mobility status        Time: [id number] OT Time Calculation (min): 35 min  Charges: OT General Charges $OT Visit: 1 Visit OT Treatments $Therapeutic Activity: 8-22 mins $Therapeutic Exercise: 8-22 mins  Malachy Chamber, OTR/L Acute Rehab Services Office: [phone]    Layla Maw 07/12/2021, 10:10 AM

## 2021-07-13 DIAGNOSIS — J9601 Acute respiratory failure with hypoxia: Secondary | ICD-10-CM | POA: Diagnosis not present

## 2021-07-13 LAB — COMPREHENSIVE METABOLIC PANEL
ALT: 26 U/L (ref 0–44)
AST: 34 U/L (ref 15–41)
Albumin: 2.6 g/dL — ABNORMAL LOW (ref 3.5–5.0)
Alkaline Phosphatase: 31 U/L — ABNORMAL LOW (ref 38–126)
Anion gap: 7 (ref 5–15)
BUN: 27 mg/dL — ABNORMAL HIGH (ref 8–23)
CO2: 26 mmol/L (ref 22–32)
Calcium: 8.3 mg/dL — ABNORMAL LOW (ref 8.9–10.3)
Chloride: 99 mmol/L (ref 98–111)
Creatinine, Ser: 1.01 mg/dL — ABNORMAL HIGH (ref 0.44–1.00)
GFR, Estimated: 54 mL/min — ABNORMAL LOW (ref >60)
Glucose, Bld: 141 mg/dL — ABNORMAL HIGH (ref 70–99)
Potassium: 4.2 mmol/L (ref 3.5–5.1)
Sodium: 132 mmol/L — ABNORMAL LOW (ref 135–145)
Total Bilirubin: 0.6 mg/dL (ref 0.3–1.2)
Total Protein: 6.3 g/dL — ABNORMAL LOW (ref 6.5–8.1)

## 2021-07-13 LAB — CBC WITH DIFFERENTIAL/PLATELET
Abs Immature Granulocytes: 0.02 10*3/uL (ref 0.00–0.07)
Basophils Absolute: 0 10*3/uL (ref 0.0–0.1)
Basophils Relative: 0 %
Eosinophils Absolute: 0 10*3/uL (ref 0.0–0.5)
Eosinophils Relative: 0 %
HCT: 26.7 % — ABNORMAL LOW (ref 36.0–46.0)
Hemoglobin: 8.9 g/dL — ABNORMAL LOW (ref 12.0–15.0)
Immature Granulocytes: 1 %
Lymphocytes Relative: 22 %
Lymphs Abs: 0.8 10*3/uL (ref 0.7–4.0)
MCH: 30.2 pg (ref 26.0–34.0)
MCHC: 33.3 g/dL (ref 30.0–36.0)
MCV: 90.5 fL (ref 80.0–100.0)
Monocytes Absolute: 0.5 10*3/uL (ref 0.1–1.0)
Monocytes Relative: 13 %
Neutro Abs: 2.4 10*3/uL (ref 1.7–7.7)
Neutrophils Relative %: 64 %
Platelets: 286 10*3/uL (ref 150–400)
RBC: 2.95 MIL/uL — ABNORMAL LOW (ref 3.87–5.11)
RDW: 15.1 % (ref 11.5–15.5)
WBC: 3.7 10*3/uL — ABNORMAL LOW (ref 4.0–10.5)
nRBC: 0 % (ref 0.0–0.2)

## 2021-07-13 LAB — C-REACTIVE PROTEIN: CRP: 7.2 mg/dL — ABNORMAL HIGH (ref <1.0)

## 2021-07-13 LAB — D-DIMER, QUANTITATIVE: D-Dimer, Quant: 5.12 ug/mL-FEU — ABNORMAL HIGH (ref 0.00–0.50)

## 2021-07-13 NOTE — Progress Notes (Signed)
PROGRESS NOTE    Mary Strickland   [id number]  DOB: 1930-06-02  PCP: Burnard Bunting, MD    DOA: 06/24/2021 LOS: 18   Assessment & Plan   Principal Problem:   Acute hypoxemic respiratory failure (HCC) Active Problems:   Hypertension   Hemoptysis   Abnormal CXR   Leukocytosis   Subcutaneous emphysema (HCC)   Pneumoperitoneum of unknown etiology   Pneumothorax on right   Acute Respiratory Failure in the setting of  Multifocal pneumonia,  Left Pneumothorax with Pneumomediastinum (resolved), Hemoptysis (resolved), COVID-19 pneumonia (diagnosed 8/8) Stable on 2-3 L/min oxygen most recently. Initially taken to the ICU intubated 7/23, extubated 7/25.   Left-sided chest tube placed 7/25, removed 7/28 Completed 10-day Unasyn course on 8/2. - Continue Remdesivir x 5 days -- Decadron -- Supportive care: albuterol, IS & flutter  --Vitamin C and zinc ordered.   --Mucinex as needed - PT recommended SNF; earliest d/c 8/19 due to 10 d isolation    Elevated troponin -Cardiology was  consulted. Elevation of troponin likely related to Respiratory failure Pneumothorax and pneumomediastinum, demand ischemia from CAD. - Echo 7/25-EF 60 to 65% - On metoprolol 12.5 mg twice daily   Malpositioned left IJ CTA neck 7/27 no acute or focal vascular injury or pseudoaneurysmal -Seen by vascular, removed    Essential hypertension -Currently on metoprolol.  Home CCB on hold   Acute urinary retention - Failed voiding trial.  Foley catheter reinserted 8/2. Foley removed 8/6. Periodic bladder scan   Seen by palliative care team  Obesity: Body mass index is 37.02 kg/m. Complicates overall care and prognosis.  Recommend lifestyle modifications including physical activity and diet for weight loss and overall long-term health.    DVT prophylaxis: SCDs Start: 06/25/21 0310   Diet:  Diet Orders (From admission, onward)     Start     Ordered   07/07/21 1725  Diet regular Room  service appropriate? Yes with Assist; Fluid consistency: Thin  Diet effective now       Question Answer Comment  Room service appropriate? Yes with Assist   Fluid consistency: Thin      07/07/21 1725              Code Status: DNR   Brief Narrative / Hospital Course to Date:   87 year old prior history of smoking, hypertension, mitral valve prolapse who was admitted with acute onset hemoptysis.  In the ED, she was initially hemodynamically stable with no acute findings seen on chest x-ray.  While in the ED she had a sudden drop of oxygen saturation to the 80s and demonstrated altered mental status/confusion, prompting endotracheal intubation on 06/24/2021.  CTA obtained post intubation revealed pneumomediastinum and left pneumothorax.  Patient was admitted by [facility], she had a left-sided chest tube placed on 7/25.  She was subsequently extubated on 7/25.  She has been off of IV pressors.  CTA chest was negative for PE, showed extensive pneumomediastinum throughout the chest extending into the base of the neck, question a degree of pneumopericardium as well.   Patient care was transferred to [facility] on 7/28 and subsequently developed hemoptysis and worsening hypoxemia.  PCCM discussed with family and plan was for more comfort care approach.  Chest tube was removed 7/28.  Patient subsequently became more stable requiring less oxygen supplementation.    Palliative care was consulted and plan is for conservative measure of care and transition patient to rehab. While awaiting placement, her screening COVID test returned positive.  She was  coughing with mild infiltrate and slight hypoxia therefore started on routine treatment remdesivir, steroids and bronchodilators.  Subjective 07/13/21    Pt doing well today.  She reports an occasional "rattly" cough, mild phlegm production.  Denies fevers, chills or SOB.  Reports appetite fair.  No acute complaints.    Disposition Plan & Communication   Status is:  Inpatient  Remains inpatient appropriate because:   On treatment for Covid-19 and requires isolation prior to SNF d/c.  Dispo: The patient is from: Home              Anticipated d/c is to: SNF              Patient currently is not medically stable to d/c.   Difficult to place patient No    Consults, Procedures, Significant Events   Consultants:  PCCM  Procedures:  Intubation, extubation Chest tube placement, removal  Antimicrobials:  Anti-infectives (From admission, onward)    Start     Dose/Rate Route Frequency Ordered Stop   07/12/21 1000  remdesivir 100 mg in sodium chloride 0.9 % 100 mL IVPB       See Hyperspace for full Linked Orders Report.   100 mg 200 mL/hr over 30 Minutes Intravenous Daily 07/11/21 1221 07/16/21 0959   07/11/21 1315  remdesivir 200 mg in sodium chloride 0.9% 250 mL IVPB       See Hyperspace for full Linked Orders Report.   200 mg 580 mL/hr over 30 Minutes Intravenous Once 07/11/21 1221 07/11/21 1722   06/27/21 1400  Ampicillin-Sulbactam (UNASYN) 3 g in sodium chloride 0.9 % 100 mL IVPB        3 g 200 mL/hr over 30 Minutes Intravenous Every 8 hours 06/27/21 0820 07/05/21 2356   06/26/21 1600  Ampicillin-Sulbactam (UNASYN) 3 g in sodium chloride 0.9 % 100 mL IVPB  Status:  Discontinued        3 g 200 mL/hr over 30 Minutes Intravenous Every 12 hours 06/26/21 1114 06/27/21 0820   06/25/21 0500  meropenem (MERREM) 1 g in sodium chloride 0.9 % 100 mL IVPB  Status:  Discontinued        1 g 200 mL/hr over 30 Minutes Intravenous Every 12 hours 06/25/21 0427 06/26/21 1114   06/25/21 0400  levofloxacin (LEVAQUIN) IVPB 750 mg  Status:  Discontinued        750 mg 100 mL/hr over 90 Minutes Intravenous Every 48 hours 06/25/21 0314 06/25/21 0427   06/25/21 0400  ceFEPIme (MAXIPIME) 2 g in sodium chloride 0.9 % 100 mL IVPB  Status:  Discontinued        2 g 200 mL/hr over 30 Minutes Intravenous Every 12 hours 06/25/21 0337 06/25/21 0427         Micro     Objective   Vitals:   07/13/21 0019 07/13/21 0607 07/13/21 0743 07/13/21 1145  BP: 136/67  (!) 154/68 136/68  Pulse: 70  72 69  Resp: '17  19 18  '$ Temp: 98.2 F (36.8 C) 97.9 F (36.6 C) 97.8 F (36.6 C) 97.6 F (36.4 C)  TempSrc: Oral Oral Oral Oral  SpO2: 98%  96% 95%  Weight:  91.8 kg    Height:        Intake/Output Summary (Last 24 hours) at 07/13/2021 1718 Last data filed at 07/13/2021 1500 Gross per 24 hour  Intake 440 ml  Output --  Net 440 ml   Filed Weights   07/11/21 0321 07/12/21 0411 07/13/21 [id number]  Weight: 91.8 kg 91.8 kg 91.8 kg    Physical Exam:  General exam: awake, alert, no acute distress HEENT: moist mucus membranes, hard of hearing  Respiratory system: CTAB, no wheezes, rales or rhonchi, normal respiratory effort, on 2 L/min oxygen. Cardiovascular system: normal S1/S2, RRR.   Central nervous system: A&O x3.  Normal speech, grossly nonfocal exam Extremities: moves all, no edema, normal tone Psychiatry: normal mood, congruent affect, judgement and insight appear normal  Labs   Data Reviewed: I have personally reviewed following labs and imaging studies  CBC: Recent Labs  Lab 07/12/21 0203 07/13/21 0132  WBC 4.7 3.7*  NEUTROABS 3.7 2.4  HGB 9.3* 8.9*  HCT 28.9* 26.7*  MCV 93.5 90.5  PLT 300 [id number]   Basic Metabolic Panel: Recent Labs  Lab 07/07/21 0132 07/08/21 0112 07/09/21 0058 07/10/21 0138 07/12/21 0203 07/13/21 0132  NA 135 133* 132* 131* 130* 132*  K 3.4* 3.9 4.3 3.8 4.0 4.2  CL 98 95* 97* 96* 96* 99  CO2 '30 27 24 27 26 26  '$ GLUCOSE 105* 92 111* 105* 129* 141*  BUN '19 19 17 18 22 '$ 27*  CREATININE 0.89 1.00 0.96 0.94 0.95 1.01*  CALCIUM 8.1* 8.5* 8.4* 8.2* 8.4* 8.3*  MG 1.9 1.9 2.0 2.0  --   --    GFR: Estimated Creatinine Clearance: 40.6 mL/min (A) (by C-G formula based on SCr of 1.01 mg/dL (H)). Liver Function Tests: Recent Labs  Lab 07/12/21 0203 07/13/21 0132  AST 37 34  ALT 27 26  ALKPHOS 33* 31*  BILITOT 0.7  0.6  PROT 6.6 6.3*  ALBUMIN 2.7* 2.6*   No results for input(s): LIPASE, AMYLASE in the last 168 hours. No results for input(s): AMMONIA in the last 168 hours. Coagulation Profile: No results for input(s): INR, PROTIME in the last 168 hours. Cardiac Enzymes: No results for input(s): CKTOTAL, CKMB, CKMBINDEX, TROPONINI in the last 168 hours. BNP (last 3 results) No results for input(s): PROBNP in the last 8760 hours. HbA1C: No results for input(s): HGBA1C in the last 72 hours. CBG: No results for input(s): GLUCAP in the last 168 hours. Lipid Profile: No results for input(s): CHOL, HDL, LDLCALC, TRIG, CHOLHDL, LDLDIRECT in the last 72 hours. Thyroid Function Tests: No results for input(s): TSH, T4TOTAL, FREET4, T3FREE, THYROIDAB in the last 72 hours. Anemia Panel: No results for input(s): VITAMINB12, FOLATE, FERRITIN, TIBC, IRON, RETICCTPCT in the last 72 hours. Sepsis Labs: No results for input(s): PROCALCITON, LATICACIDVEN in the last 168 hours.   Recent Results (from the past 240 hour(s))  SARS CORONAVIRUS 2 (TAT 6-24 HRS) Nasopharyngeal Nasopharyngeal Swab     Status: Abnormal   Collection Time: 07/10/21  1:36 PM   Specimen: Nasopharyngeal Swab  Result Value Ref Range Status   SARS Coronavirus 2 POSITIVE (A) NEGATIVE Final    Comment: (NOTE) SARS-CoV-2 target nucleic acids are DETECTED.  The SARS-CoV-2 RNA is generally detectable in upper and lower respiratory specimens during the acute phase of infection. Positive results are indicative of the presence of SARS-CoV-2 RNA. Clinical correlation with patient history and other diagnostic information is  necessary to determine patient infection status. Positive results do not rule out bacterial infection or co-infection with other viruses.  The expected result is Negative.  Fact Sheet for Patients: SugarRoll.be  Fact Sheet for Healthcare  Providers: https://www.woods-mathews.com/  This test is not yet approved or cleared by the [place] FDA and  has been authorized for detection and/or diagnosis of SARS-CoV-2 by FDA  under an Emergency Use Authorization (EUA). This EUA will remain  in effect (meaning this test can be used) for the duration of the COVID-19 declaration under Section 564(b)(1) of the Act, 21 U. S.C. section 360bbb-3(b)(1), unless the authorization is terminated or revoked sooner.   Performed at [facility], [place] [address], [place], [place] [address]       Imaging Studies   No results found.   Medications   Scheduled Meds:  vitamin C  500 mg Oral Daily   Chlorhexidine Gluconate Cloth  6 each Topical Daily   dexamethasone  6 mg Oral Q24H   feeding supplement  237 mL Oral TID BM   guaiFENesin  1,200 mg Oral BID   Ipratropium-Albuterol  1 puff Inhalation Q6H   mouth rinse  15 mL Mouth Rinse BID   metoprolol tartrate  12.5 mg Oral BID   multivitamin with minerals  1 tablet Oral Daily   nystatin  5 mL Oral QID   pantoprazole  40 mg Oral BID   zinc sulfate  220 mg Oral Daily   Continuous Infusions:  sodium chloride 500 mL (07/11/21 1647)   remdesivir 100 mg in NS 100 mL 100 mg (07/13/21 0955)       LOS: 18 days    Time spent: 25 minutes    Ezekiel Slocumb, DO [facility]  07/13/2021, 5:18 PM      If 7PM-7AM, please contact night-coverage. How to contact the [facility] Attending or Consulting provider [place] or covering provider during after hours [place], for this patient?    Check the care team in [facility] and look for a) attending/consulting [facility] provider listed and b) the [facility] team listed Log into [email or web address] and use [place] universal password to access. If you do not have the password, please contact the hospital operator. Locate the [facility] provider you are looking for under [facility] and page to a number that you can be directly reached. If you  still have difficulty reaching the provider, please page the [facility] (Director on Call) for the Hospitalists listed on amion for assistance.

## 2021-07-13 NOTE — Progress Notes (Signed)
Physical Therapy Treatment Patient Details Name: Mary Strickland MRN: [id number] DOB: 05/30/1930 Today's Date: 07/13/2021    History of Present Illness 87 y.o. female presents to [place] long ED on 06/24/2021 with sudden onset hemoptysis. Pt experienced a bout of hypoxia in ED with AMS and was intubated on 7/23. Pt was transported to [facility] hospital on 7/24, found to have pneumomediastinum, pneumothorax. Chest tube inserted 7/24. Pt extubated on 7/25. Now covid+ as of 07/10/21. PMH includes history of smoking, hypertension, mitral valve prolapse.    PT Comments    Patient received in bed, very HOH. She initially does not want to walk or get up to recliner, but eventually agrees to get up to recliner. Performed LE exercises and is supervision for bed mobility, transfers with min guard and ambulated a few feet to recliner with min guard. Patient limited by fatigue and sob. She will continue to benefit from skilled PT while here to improve strength, activity tolerance and safety with mobility.        Follow Up Recommendations  SNF     Equipment Recommendations  Rolling walker with 5" wheels;3in1 (PT)    Recommendations for Other Services       Precautions / Restrictions Precautions Precautions: Fall Precaution Comments: Airborne/Contact precs (Covid+ 8/8) Restrictions Weight Bearing Restrictions: No    Mobility  Bed Mobility Overal bed mobility: Needs Assistance Bed Mobility: Supine to Sit     Supine to sit: HOB elevated;Supervision     General bed mobility comments: use of bed rails, increased time and effort.    Transfers Overall transfer level: Needs assistance Equipment used: Rolling walker (2 wheeled) Transfers: Sit to/from Stand Sit to Stand: Min guard         General transfer comment: Performed sit to stand with min guard from bed  Ambulation/Gait Ambulation/Gait assistance: Min guard Gait Distance (Feet): 3 Feet Assistive device: Rolling walker (2  wheeled) Gait Pattern/deviations: Step-to pattern;Decreased step length - right;Decreased step length - left Gait velocity: decreased   General Gait Details: Cues needed for safety, lines. Patient initially declined walking, but then agreed to get up to recliner.   Stairs             Wheelchair Mobility    Modified Rankin (Stroke Patients Only)       Balance Overall balance assessment: Needs assistance Sitting-balance support: Feet supported Sitting balance-[name] Scale: Good     Standing balance support: Bilateral upper extremity supported;During functional activity Standing balance-[name] Scale: Fair Standing balance comment: reliant on B UE support with dynamic mobility, min guard                            Cognition Arousal/Alertness: Awake/alert Behavior During Therapy: WFL for tasks assessed/performed Overall Cognitive Status: No family/caregiver present to determine baseline cognitive functioning                                 General Comments: WFL for basic tasks though noted with some increased confusion and difficulty problem solving, safety techniques, etc      Exercises Other Exercises Other Exercises: B LE exercises: AP, heel slides, SLR x 10, seated LAQ, marching x 10, STS x 8 reps from EOB.    General Comments        Pertinent Vitals/Pain Pain Assessment: No/denies pain    Home Living  Prior Function            PT Goals (current goals can now be found in the care plan section) Acute Rehab PT Goals Patient Stated Goal: get better PT Goal Formulation: With patient Time For Goal Achievement: 07/16/21 Potential to Achieve Goals: Good Progress towards PT goals: Progressing toward goals    Frequency    Min 2X/week      PT Plan Current plan remains appropriate    Co-evaluation              AM-PAC PT "6 Clicks" Mobility   Outcome Measure  Help needed turning from your  back to your side while in a flat bed without using bedrails?: A Little Help needed moving from lying on your back to sitting on the side of a flat bed without using bedrails?: A Little Help needed moving to and from a bed to a chair (including a wheelchair)?: A Little Help needed standing up from a chair using your arms (e.g., wheelchair or bedside chair)?: A Little Help needed to walk in hospital room?: A Little Help needed climbing 3-5 steps with a railing? : A Lot 6 Click Score: 17    End of Session Equipment Utilized During Treatment: Gait belt;Oxygen Activity Tolerance: Patient limited by fatigue Patient left: in chair;with call bell/phone within reach Nurse Communication: Mobility status PT Visit Diagnosis: Muscle weakness (generalized) (M62.81);Other abnormalities of gait and mobility (R26.89);Difficulty in walking, not elsewhere classified (R26.2)     Time: [id number] PT Time Calculation (min) (ACUTE ONLY): 31 min  Charges:  $Gait Training: 8-22 mins $Therapeutic Exercise: 8-22 mins                    [employer], PT, GCS 07/13/21,12:05 PM

## 2021-07-13 NOTE — Progress Notes (Signed)
Pt keep c/o her feet and legs being hot and asked to be washed or wipe down with cool water. Feet washed with cool water and lotion applied. We'll continue to monitor.

## 2021-07-14 DIAGNOSIS — J9601 Acute respiratory failure with hypoxia: Secondary | ICD-10-CM | POA: Diagnosis not present

## 2021-07-14 LAB — CBC WITH DIFFERENTIAL/PLATELET
Abs Immature Granulocytes: 0.04 K/uL (ref 0.00–0.07)
Basophils Absolute: 0 K/uL (ref 0.0–0.1)
Basophils Relative: 0 %
Eosinophils Absolute: 0 K/uL (ref 0.0–0.5)
Eosinophils Relative: 0 %
HCT: 26.8 % — ABNORMAL LOW (ref 36.0–46.0)
Hemoglobin: 9 g/dL — ABNORMAL LOW (ref 12.0–15.0)
Immature Granulocytes: 1 %
Lymphocytes Relative: 16 %
Lymphs Abs: 0.6 K/uL — ABNORMAL LOW (ref 0.7–4.0)
MCH: 30.6 pg (ref 26.0–34.0)
MCHC: 33.6 g/dL (ref 30.0–36.0)
MCV: 91.2 fL (ref 80.0–100.0)
Monocytes Absolute: 0.2 K/uL (ref 0.1–1.0)
Monocytes Relative: 5 %
Neutro Abs: 3.2 K/uL (ref 1.7–7.7)
Neutrophils Relative %: 78 %
Platelets: 276 K/uL (ref 150–400)
RBC: 2.94 MIL/uL — ABNORMAL LOW (ref 3.87–5.11)
RDW: 15.3 % (ref 11.5–15.5)
WBC: 4.1 K/uL (ref 4.0–10.5)
nRBC: 0 % (ref 0.0–0.2)

## 2021-07-14 LAB — COMPREHENSIVE METABOLIC PANEL WITH GFR
ALT: 28 U/L (ref 0–44)
AST: 82 U/L — ABNORMAL HIGH (ref 15–41)
Albumin: 2.6 g/dL — ABNORMAL LOW (ref 3.5–5.0)
Alkaline Phosphatase: 31 U/L — ABNORMAL LOW (ref 38–126)
Anion gap: 8 (ref 5–15)
BUN: 28 mg/dL — ABNORMAL HIGH (ref 8–23)
CO2: 23 mmol/L (ref 22–32)
Calcium: 8.2 mg/dL — ABNORMAL LOW (ref 8.9–10.3)
Chloride: 99 mmol/L (ref 98–111)
Creatinine, Ser: 1 mg/dL (ref 0.44–1.00)
GFR, Estimated: 54 mL/min — ABNORMAL LOW
Glucose, Bld: 178 mg/dL — ABNORMAL HIGH (ref 70–99)
Potassium: 5.1 mmol/L (ref 3.5–5.1)
Sodium: 130 mmol/L — ABNORMAL LOW (ref 135–145)
Total Bilirubin: 1.4 mg/dL — ABNORMAL HIGH (ref 0.3–1.2)
Total Protein: 6 g/dL — ABNORMAL LOW (ref 6.5–8.1)

## 2021-07-14 LAB — C-REACTIVE PROTEIN: CRP: 4.7 mg/dL — ABNORMAL HIGH (ref <1.0)

## 2021-07-14 LAB — D-DIMER, QUANTITATIVE: D-Dimer, Quant: 3.84 ug{FEU}/mL — ABNORMAL HIGH (ref 0.00–0.50)

## 2021-07-14 MED ORDER — ENOXAPARIN SODIUM 40 MG/0.4ML IJ SOSY
40.0000 mg | PREFILLED_SYRINGE | Freq: Every day | INTRAMUSCULAR | Status: DC
  Administered 2021-07-14 – 2021-07-24 (×11): 40 mg via SUBCUTANEOUS
  Filled 2021-07-14 (×11): qty 0.4

## 2021-07-14 NOTE — Progress Notes (Signed)
PROGRESS NOTE    Mary Strickland   [id number]  DOB: 11-21-1930  PCP: Burnard Bunting, MD    DOA: 06/24/2021 LOS: 19   Assessment & Plan   Principal Problem:   Acute hypoxemic respiratory failure (HCC) Active Problems:   Hypertension   Hemoptysis   Abnormal CXR   Leukocytosis   Subcutaneous emphysema (HCC)   Pneumoperitoneum of unknown etiology   Pneumothorax on right   Acute Respiratory Failure in the setting of  Multifocal pneumonia,  Left Pneumothorax with Pneumomediastinum (resolved), Hemoptysis (resolved), COVID-19 pneumonia (diagnosed 8/8) Stable on 2-3 L/min oxygen most recently. Initially taken to the ICU intubated 7/23, extubated 7/25.   Left-sided chest tube placed 7/25, removed 7/28 Completed 10-day Unasyn course on 8/2. - Continue Remdesivir x 5 days -- Decadron -- Supportive care: albuterol, IS & flutter  --Vitamin C and zinc ordered.   --Mucinex as needed - PT recommended SNF; earliest d/c 8/19 due to 10 d isolation    Elevated troponin -Cardiology was  consulted. Elevation of troponin likely related to Respiratory failure Pneumothorax and pneumomediastinum, demand ischemia from CAD. - Echo 7/25-EF 60 to 65% - On metoprolol 12.5 mg twice daily   Malpositioned left IJ CTA neck 7/27 no acute or focal vascular injury or pseudoaneurysmal -Seen by vascular, removed    Essential hypertension -Currently on metoprolol.  Home CCB on hold   Acute urinary retention - Failed voiding trial.  Foley catheter reinserted 8/2. Foley removed 8/6. Periodic bladder scan   Seen by palliative care team  Obesity: Body mass index is 31.28 kg/m. Complicates overall care and prognosis.  Recommend lifestyle modifications including physical activity and diet for weight loss and overall long-term health.    DVT prophylaxis: enoxaparin (LOVENOX) injection 40 mg Start: 07/14/21 2200 SCDs Start: 06/25/21 0310   Diet:  Diet Orders (From admission, onward)      Start     Ordered   07/07/21 1725  Diet regular Room service appropriate? Yes with Assist; Fluid consistency: Thin  Diet effective now       Question Answer Comment  Room service appropriate? Yes with Assist   Fluid consistency: Thin      07/07/21 1725              Code Status: DNR   Brief Narrative / Hospital Course to Date:   87 year old prior history of smoking, hypertension, mitral valve prolapse who was admitted with acute onset hemoptysis.  In the ED, she was initially hemodynamically stable with no acute findings seen on chest x-ray.  While in the ED she had a sudden drop of oxygen saturation to the 80s and demonstrated altered mental status/confusion, prompting endotracheal intubation on 06/24/2021.  CTA obtained post intubation revealed pneumomediastinum and left pneumothorax.  Patient was admitted by [facility], she had a left-sided chest tube placed on 7/25.  She was subsequently extubated on 7/25.  She has been off of IV pressors.  CTA chest was negative for PE, showed extensive pneumomediastinum throughout the chest extending into the base of the neck, question a degree of pneumopericardium as well.   Patient care was transferred to [facility] on 7/28 and subsequently developed hemoptysis and worsening hypoxemia.  PCCM discussed with family and plan was for more comfort care approach.  Chest tube was removed 7/28.  Patient subsequently became more stable requiring less oxygen supplementation.    Palliative care was consulted and plan is for conservative measure of care and transition patient to rehab. While awaiting placement, her  screening COVID test returned positive.  She was coughing with mild infiltrate and slight hypoxia therefore started on routine treatment remdesivir, steroids and bronchodilators.  Subjective 07/14/21    Pt reports her IV somehow got pulled out and she is asking when someone will come to put a new one in.  Asked to be really covered with her she blankets because she  is cold.  Denies any fever chills shortness of breath, only occasional cough.  Overall feeling well.  No acute events reported.   Disposition Plan & Communication   Status is: Inpatient  Remains inpatient appropriate because:   On treatment for Covid-19 and requires isolation prior to SNF d/c.  Dispo: The patient is from: Home              Anticipated d/c is to: SNF              Patient currently is not medically stable to d/c.   Difficult to place patient No    Consults, Procedures, Significant Events   Consultants:  PCCM  Procedures:  Intubation, extubation Chest tube placement, removal  Antimicrobials:  Anti-infectives (From admission, onward)    Start     Dose/Rate Route Frequency Ordered Stop   07/12/21 1000  remdesivir 100 mg in sodium chloride 0.9 % 100 mL IVPB       See Hyperspace for full Linked Orders Report.   100 mg 200 mL/hr over 30 Minutes Intravenous Daily 07/11/21 1221 07/16/21 0959   07/11/21 1315  remdesivir 200 mg in sodium chloride 0.9% 250 mL IVPB       See Hyperspace for full Linked Orders Report.   200 mg 580 mL/hr over 30 Minutes Intravenous Once 07/11/21 1221 07/11/21 1722   06/27/21 1400  Ampicillin-Sulbactam (UNASYN) 3 g in sodium chloride 0.9 % 100 mL IVPB        3 g 200 mL/hr over 30 Minutes Intravenous Every 8 hours 06/27/21 0820 07/05/21 2356   06/26/21 1600  Ampicillin-Sulbactam (UNASYN) 3 g in sodium chloride 0.9 % 100 mL IVPB  Status:  Discontinued        3 g 200 mL/hr over 30 Minutes Intravenous Every 12 hours 06/26/21 1114 06/27/21 0820   06/25/21 0500  meropenem (MERREM) 1 g in sodium chloride 0.9 % 100 mL IVPB  Status:  Discontinued        1 g 200 mL/hr over 30 Minutes Intravenous Every 12 hours 06/25/21 0427 06/26/21 1114   06/25/21 0400  levofloxacin (LEVAQUIN) IVPB 750 mg  Status:  Discontinued        750 mg 100 mL/hr over 90 Minutes Intravenous Every 48 hours 06/25/21 0314 06/25/21 0427   06/25/21 0400  ceFEPIme (MAXIPIME) 2 g  in sodium chloride 0.9 % 100 mL IVPB  Status:  Discontinued        2 g 200 mL/hr over 30 Minutes Intravenous Every 12 hours 06/25/21 0337 06/25/21 0427         Micro    Objective   Vitals:   07/13/21 2106 07/13/21 2359 07/14/21 0337 07/14/21 0817  BP: (!) 141/54 (!) 148/63 (!) 126/59 137/60  Pulse: 72 76 70 67  Resp: '18 20 17 17  '$ Temp: 98.1 F (36.7 C) 98.2 F (36.8 C) 98 F (36.7 C) 98 F (36.7 C)  TempSrc: Oral Oral Oral Oral  SpO2: 98% 99% 97% 99%  Weight:   77.6 kg   Height:        Intake/Output Summary (Last 24 hours)  at 07/14/2021 1452 Last data filed at 07/14/2021 0710 Gross per 24 hour  Intake 440 ml  Output 725 ml  Net -285 ml   Filed Weights   07/12/21 0411 07/13/21 0607 07/14/21 0337  Weight: 91.8 kg 91.8 kg 77.6 kg    Physical Exam:  General exam: awake, alert, no acute distress HEENT: moist mucus membranes, hard of hearing  Respiratory system: CTAB, normal respiratory effort, on 3 L/min oxygen. Cardiovascular system: normal S1/S2, RRR.   Central nervous system: A&O x3.  Normal speech, grossly nonfocal exam Extremities: moves all, no edema, normal tone Psychiatry: normal mood, congruent affect, judgement and insight appear normal  Labs   Data Reviewed: I have personally reviewed following labs and imaging studies  CBC: Recent Labs  Lab 07/12/21 0203 07/13/21 0132 07/14/21 0055  WBC 4.7 3.7* 4.1  NEUTROABS 3.7 2.4 3.2  HGB 9.3* 8.9* 9.0*  HCT 28.9* 26.7* 26.8*  MCV 93.5 90.5 91.2  PLT 300 286 [id number]   Basic Metabolic Panel: Recent Labs  Lab 07/08/21 0112 07/09/21 0058 07/10/21 0138 07/12/21 0203 07/13/21 0132 07/14/21 0055  NA 133* 132* 131* 130* 132* 130*  K 3.9 4.3 3.8 4.0 4.2 5.1  CL 95* 97* 96* 96* 99 99  CO2 '27 24 27 26 26 23  '$ GLUCOSE 92 111* 105* 129* 141* 178*  BUN '19 17 18 22 '$ 27* 28*  CREATININE 1.00 0.96 0.94 0.95 1.01* 1.00  CALCIUM 8.5* 8.4* 8.2* 8.4* 8.3* 8.2*  MG 1.9 2.0 2.0  --   --   --    GFR: Estimated  Creatinine Clearance: 37.5 mL/min (by C-G formula based on SCr of 1 mg/dL). Liver Function Tests: Recent Labs  Lab 07/12/21 0203 07/13/21 0132 07/14/21 0055  AST 37 34 82*  ALT '27 26 28  '$ ALKPHOS 33* 31* 31*  BILITOT 0.7 0.6 1.4*  PROT 6.6 6.3* 6.0*  ALBUMIN 2.7* 2.6* 2.6*   No results for input(s): LIPASE, AMYLASE in the last 168 hours. No results for input(s): AMMONIA in the last 168 hours. Coagulation Profile: No results for input(s): INR, PROTIME in the last 168 hours. Cardiac Enzymes: No results for input(s): CKTOTAL, CKMB, CKMBINDEX, TROPONINI in the last 168 hours. BNP (last 3 results) No results for input(s): PROBNP in the last 8760 hours. HbA1C: No results for input(s): HGBA1C in the last 72 hours. CBG: No results for input(s): GLUCAP in the last 168 hours. Lipid Profile: No results for input(s): CHOL, HDL, LDLCALC, TRIG, CHOLHDL, LDLDIRECT in the last 72 hours. Thyroid Function Tests: No results for input(s): TSH, T4TOTAL, FREET4, T3FREE, THYROIDAB in the last 72 hours. Anemia Panel: No results for input(s): VITAMINB12, FOLATE, FERRITIN, TIBC, IRON, RETICCTPCT in the last 72 hours. Sepsis Labs: No results for input(s): PROCALCITON, LATICACIDVEN in the last 168 hours.   Recent Results (from the past 240 hour(s))  SARS CORONAVIRUS 2 (TAT 6-24 HRS) Nasopharyngeal Nasopharyngeal Swab     Status: Abnormal   Collection Time: 07/10/21  1:36 PM   Specimen: Nasopharyngeal Swab  Result Value Ref Range Status   SARS Coronavirus 2 POSITIVE (A) NEGATIVE Final    Comment: (NOTE) SARS-CoV-2 target nucleic acids are DETECTED.  The SARS-CoV-2 RNA is generally detectable in upper and lower respiratory specimens during the acute phase of infection. Positive results are indicative of the presence of SARS-CoV-2 RNA. Clinical correlation with patient history and other diagnostic information is  necessary to determine patient infection status. Positive results do not rule out  bacterial infection or co-infection  with other viruses.  The expected result is Negative.  Fact Sheet for Patients: SugarRoll.be  Fact Sheet for Healthcare Providers: https://www.woods-mathews.com/  This test is not yet approved or cleared by the [place] FDA and  has been authorized for detection and/or diagnosis of SARS-CoV-2 by FDA under an Emergency Use Authorization (EUA). This EUA will remain  in effect (meaning this test can be used) for the duration of the COVID-19 declaration under Section 564(b)(1) of the Act, 21 U. S.C. section 360bbb-3(b)(1), unless the authorization is terminated or revoked sooner.   Performed at [facility], [place] [address] [place], [place] [address]       Imaging Studies   No results found.   Medications   Scheduled Meds:  vitamin C  500 mg Oral Daily   Chlorhexidine Gluconate Cloth  6 each Topical Daily   dexamethasone  6 mg Oral Q24H   enoxaparin (LOVENOX) injection  40 mg Subcutaneous QHS   feeding supplement  237 mL Oral TID BM   guaiFENesin  1,200 mg Oral BID   Ipratropium-Albuterol  1 puff Inhalation Q6H   mouth rinse  15 mL Mouth Rinse BID   metoprolol tartrate  12.5 mg Oral BID   multivitamin with minerals  1 tablet Oral Daily   pantoprazole  40 mg Oral BID   zinc sulfate  220 mg Oral Daily   Continuous Infusions:  sodium chloride 500 mL (07/11/21 1647)   remdesivir 100 mg in NS 100 mL 100 mg (07/14/21 0838)       LOS: 19 days    Time spent: 25 minutes    Ezekiel Slocumb, DO [facility]  07/14/2021, 2:52 PM      If 7PM-7AM, please contact night-coverage. How to contact the [facility] Attending or Consulting provider [place] or covering provider during after hours [place], for this patient?    Check the care team in [facility] and look for a) attending/consulting [facility] provider listed and b) the [facility] team listed Log into [email or web address] and use [place]'s universal  password to access. If you do not have the password, please contact the hospital operator. Locate the [facility] provider you are looking for under [facility] and page to a number that you can be directly reached. If you still have difficulty reaching the provider, please page the [facility] (Director on Call) for the Hospitalists listed on amion for assistance.

## 2021-07-14 NOTE — Progress Notes (Signed)
Nutrition Follow-up  DOCUMENTATION CODES:   Not applicable  INTERVENTION:   - Continue Ensure Enlive po TID, each supplement provides 350 kcal and 20 grams of protein  - Continue MVI with minerals daily  NUTRITION DIAGNOSIS:   Inadequate oral intake related to inability to eat as evidenced by NPO status.  Diet advanced   GOAL:   Patient will meet greater than or equal to 90% of their needs  Progressing  MONITOR:   PO intake, Supplement acceptance, Labs, Weight trends, I & O's  REASON FOR ASSESSMENT:   Ventilator    ASSESSMENT:   87 y.o. female who is a former smoker and has medical history of HTN, dyslipidemia, GERD, Morton's neuroma, idiopathic scoliosis, mitral valve prolapse, anxiety, psychosis, and diverticulitis. She presented to the ED via EMS d/t acute onset of hemoptysis. In the ED she was initially hemodynamically stable but then experienced sudden episode of O2 desaturation into the 80s and became altered with confusion. She was subsequently intubated in the ED.  7/25 - extubated 7/26 - chest tube placed for L pneumothorax, clear liquids, transferred to [facility] 7/27 - diet advanced to regular consistency  8/08 - COVID+   Intake continues to increase slowly. Last four meal completions charted as 50%, 25%, 75%, and 75%. Taking Ensure 2-3 times daily.   Unable to d/c to SNF until finished with COVID treatment/isolation period.   Admit weight: 73.5 kg Current weight: 77.6 kg   Medications: 500 mg vitamin C, decadron, zinc sulfate 220 mg  Labs: Na 130 (L)   Diet Order:   Diet Order             Diet regular Room service appropriate? Yes with Assist; Fluid consistency: Thin  Diet effective now                   EDUCATION NEEDS:   Education needs have been addressed  Skin:  Skin Assessment: Reviewed RN Assessment  Last BM:  8/11  Height:   Ht Readings from Last 1 Encounters:  06/25/21 '5\' 2"'$  (1.575 m)    Weight:   Wt Readings from Last 1  Encounters:  07/14/21 77.6 kg    Ideal Body Weight:  50 kg  BMI:  Body mass index is 31.28 kg/m.  Estimated Nutritional Needs:   Kcal:  1400-1600  Protein:  70-85 grams  Fluid:  >/= 1.5 L/day   Mariana Single MS, RD, LDN, CNSC Clinical Nutrition Pager listed in [place]

## 2021-07-15 DIAGNOSIS — J9601 Acute respiratory failure with hypoxia: Secondary | ICD-10-CM | POA: Diagnosis not present

## 2021-07-15 LAB — CBC WITH DIFFERENTIAL/PLATELET
Abs Immature Granulocytes: 0.06 10*3/uL (ref 0.00–0.07)
Basophils Absolute: 0 10*3/uL (ref 0.0–0.1)
Basophils Relative: 0 %
Eosinophils Absolute: 0 10*3/uL (ref 0.0–0.5)
Eosinophils Relative: 0 %
HCT: 27.8 % — ABNORMAL LOW (ref 36.0–46.0)
Hemoglobin: 9.2 g/dL — ABNORMAL LOW (ref 12.0–15.0)
Immature Granulocytes: 1 %
Lymphocytes Relative: 19 %
Lymphs Abs: 1.2 10*3/uL (ref 0.7–4.0)
MCH: 30.4 pg (ref 26.0–34.0)
MCHC: 33.1 g/dL (ref 30.0–36.0)
MCV: 91.7 fL (ref 80.0–100.0)
Monocytes Absolute: 0.5 10*3/uL (ref 0.1–1.0)
Monocytes Relative: 8 %
Neutro Abs: 4.6 10*3/uL (ref 1.7–7.7)
Neutrophils Relative %: 72 %
Platelets: 248 10*3/uL (ref 150–400)
RBC: 3.03 MIL/uL — ABNORMAL LOW (ref 3.87–5.11)
RDW: 15.4 % (ref 11.5–15.5)
WBC: 6.3 10*3/uL (ref 4.0–10.5)
nRBC: 0.3 % — ABNORMAL HIGH (ref 0.0–0.2)

## 2021-07-15 LAB — COMPREHENSIVE METABOLIC PANEL
ALT: 34 U/L (ref 0–44)
AST: 50 U/L — ABNORMAL HIGH (ref 15–41)
Albumin: 2.6 g/dL — ABNORMAL LOW (ref 3.5–5.0)
Alkaline Phosphatase: 31 U/L — ABNORMAL LOW (ref 38–126)
Anion gap: 8 (ref 5–15)
BUN: 29 mg/dL — ABNORMAL HIGH (ref 8–23)
CO2: 25 mmol/L (ref 22–32)
Calcium: 8.4 mg/dL — ABNORMAL LOW (ref 8.9–10.3)
Chloride: 100 mmol/L (ref 98–111)
Creatinine, Ser: 0.93 mg/dL (ref 0.44–1.00)
GFR, Estimated: 59 mL/min — ABNORMAL LOW (ref >60)
Glucose, Bld: 141 mg/dL — ABNORMAL HIGH (ref 70–99)
Potassium: 4.6 mmol/L (ref 3.5–5.1)
Sodium: 133 mmol/L — ABNORMAL LOW (ref 135–145)
Total Bilirubin: 0.7 mg/dL (ref 0.3–1.2)
Total Protein: 6.1 g/dL — ABNORMAL LOW (ref 6.5–8.1)

## 2021-07-15 LAB — D-DIMER, QUANTITATIVE: D-Dimer, Quant: 3.57 ug/mL-FEU — ABNORMAL HIGH (ref 0.00–0.50)

## 2021-07-15 LAB — C-REACTIVE PROTEIN: CRP: 3.1 mg/dL — ABNORMAL HIGH (ref <1.0)

## 2021-07-15 MED ORDER — IRBESARTAN 75 MG PO TABS
37.5000 mg | ORAL_TABLET | Freq: Every day | ORAL | Status: DC
  Administered 2021-07-15 – 2021-07-25 (×11): 37.5 mg via ORAL
  Filled 2021-07-15 (×11): qty 0.5

## 2021-07-15 NOTE — Progress Notes (Signed)
PROGRESS NOTE    Mary Strickland   [id number]  DOB: Aug 04, 1930  PCP: Burnard Bunting, MD    DOA: 06/24/2021 LOS: 20   Assessment & Plan   Principal Problem:   Acute hypoxemic respiratory failure (HCC) Active Problems:   Hypertension   Hemoptysis   Abnormal CXR   Leukocytosis   Subcutaneous emphysema (HCC)   Pneumoperitoneum of unknown etiology   Pneumothorax on right   Acute Respiratory Failure with hypoxia in the setting of  Multifocal pneumonia due to COVID-19 (diagnosed 8/8),  Left Pneumothorax with Pneumomediastinum (resolved), Hemoptysis (resolved), Stable on 2-3 L/min oxygen most recently. Initially taken to the ICU intubated 7/23, extubated 7/25.   Left-sided chest tube placed 7/25, removed 7/28 Completed 10-day Unasyn course on 8/2. - Completed remdesivir x 5 days on 8/13 --Continue Decadron  -- Supportive care: albuterol, IS & flutter  --Vitamin C and zinc ordered.   --Mucinex as needed - PT recommended SNF; earliest d/c 8/19 due to 10 d isolation --Wean oxygen as tolerated, maintain sat above 90%    Elevated troponin -Cardiology was  consulted. Elevation of troponin likely related to Respiratory failure Pneumothorax and pneumomediastinum, demand ischemia from CAD. - Echo 7/25-EF 60 to 65% - On metoprolol 12.5 mg twice daily   Malpositioned left IJ CTA neck 7/27 no acute or focal vascular injury or pseudoaneurysmal -Seen by vascular, removed    Essential hypertension -Currently on metoprolol.  Home CCB on hold   Acute urinary retention - Failed voiding trial.  Foley catheter reinserted 8/2. Foley removed 8/6. Periodic bladder scan   Seen by palliative care team  Obesity: Body mass index is 31.46 kg/m. Complicates overall care and prognosis.  Recommend lifestyle modifications including physical activity and diet for weight loss and overall long-term health.    DVT prophylaxis: enoxaparin (LOVENOX) injection 40 mg Start: 07/14/21  2200 SCDs Start: 06/25/21 0310   Diet:  Diet Orders (From admission, onward)     Start     Ordered   07/07/21 1725  Diet regular Room service appropriate? Yes with Assist; Fluid consistency: Thin  Diet effective now       Question Answer Comment  Room service appropriate? Yes with Assist   Fluid consistency: Thin      07/07/21 1725              Code Status: DNR   Brief Narrative / Hospital Course to Date:   87 year old prior history of smoking, hypertension, mitral valve prolapse who was admitted with acute onset hemoptysis.  In the ED, she was initially hemodynamically stable with no acute findings seen on chest x-ray.  While in the ED she had a sudden drop of oxygen saturation to the 80s and demonstrated altered mental status/confusion, prompting endotracheal intubation on 06/24/2021.  CTA obtained post intubation revealed pneumomediastinum and left pneumothorax.  Patient was admitted by [facility], she had a left-sided chest tube placed on 7/25.  She was subsequently extubated on 7/25.  She has been off of IV pressors.  CTA chest was negative for PE, showed extensive pneumomediastinum throughout the chest extending into the base of the neck, question a degree of pneumopericardium as well.   Patient care was transferred to [facility] on 7/28 and subsequently developed hemoptysis and worsening hypoxemia.  PCCM discussed with family and plan was for more comfort care approach.  Chest tube was removed 7/28.  Patient subsequently became more stable requiring less oxygen supplementation.    Palliative care was consulted and plan is  for conservative measure of care and transition patient to rehab. While awaiting placement, her screening COVID test returned positive.  She was coughing with mild infiltrate and slight hypoxia therefore started on routine treatment remdesivir, steroids and bronchodilators.  Subjective 07/15/21    Pt awake sitting up in bed when seen this morning.  She reports feeling okay.   Denies any shortness of breath and only occasional cough.  No fevers or chills.  Is requesting to use a bedpan.  No acute events reported   Disposition Plan & Communication   Status is: Inpatient  Remains inpatient appropriate because:   On treatment for Covid-19 and requires isolation prior to SNF d/c.  Dispo: The patient is from: Home              Anticipated d/c is to: SNF              Patient currently is not medically stable to d/c.   Difficult to place patient No    Consults, Procedures, Significant Events   Consultants:  PCCM  Procedures:  Intubation, extubation Chest tube placement, removal  Antimicrobials:  Anti-infectives (From admission, onward)    Start     Dose/Rate Route Frequency Ordered Stop   07/12/21 1000  remdesivir 100 mg in sodium chloride 0.9 % 100 mL IVPB       See Hyperspace for full Linked Orders Report.   100 mg 200 mL/hr over 30 Minutes Intravenous Daily 07/11/21 1221 07/15/21 0906   07/11/21 1315  remdesivir 200 mg in sodium chloride 0.9% 250 mL IVPB       See Hyperspace for full Linked Orders Report.   200 mg 580 mL/hr over 30 Minutes Intravenous Once 07/11/21 1221 07/11/21 1722   06/27/21 1400  Ampicillin-Sulbactam (UNASYN) 3 g in sodium chloride 0.9 % 100 mL IVPB        3 g 200 mL/hr over 30 Minutes Intravenous Every 8 hours 06/27/21 0820 07/05/21 2356   06/26/21 1600  Ampicillin-Sulbactam (UNASYN) 3 g in sodium chloride 0.9 % 100 mL IVPB  Status:  Discontinued        3 g 200 mL/hr over 30 Minutes Intravenous Every 12 hours 06/26/21 1114 06/27/21 0820   06/25/21 0500  meropenem (MERREM) 1 g in sodium chloride 0.9 % 100 mL IVPB  Status:  Discontinued        1 g 200 mL/hr over 30 Minutes Intravenous Every 12 hours 06/25/21 0427 06/26/21 1114   06/25/21 0400  levofloxacin (LEVAQUIN) IVPB 750 mg  Status:  Discontinued        750 mg 100 mL/hr over 90 Minutes Intravenous Every 48 hours 06/25/21 0314 06/25/21 0427   06/25/21 0400  ceFEPIme  (MAXIPIME) 2 g in sodium chloride 0.9 % 100 mL IVPB  Status:  Discontinued        2 g 200 mL/hr over 30 Minutes Intravenous Every 12 hours 06/25/21 0337 06/25/21 0427         Micro    Objective   Vitals:   07/14/21 2359 07/15/21 0326 07/15/21 0327 07/15/21 1000  BP: (!) 150/71 (!) 147/57  (!) 170/67  Pulse: 80 67  68  Resp: '13 16  16  '$ Temp: 98.6 F (37 C) 98.9 F (37.2 C)  98.1 F (36.7 C)  TempSrc: Oral Oral  Oral  SpO2: 100% 99%  97%  Weight:   78 kg   Height:        Intake/Output Summary (Last 24 hours) at 07/15/2021 1440  Last data filed at 07/15/2021 1406 Gross per 24 hour  Intake 240 ml  Output 1800 ml  Net -1560 ml   Filed Weights   07/13/21 0607 07/14/21 0337 07/15/21 0327  Weight: 91.8 kg 77.6 kg 78 kg    Physical Exam:  General exam: awake, alert, no acute distress Respiratory system: CTAB, normal respiratory effort, on 3 L/min oxygen. Cardiovascular system: normal S1/S2, RRR.   Central nervous system: A&O x3.  Normal speech, grossly nonfocal exam Psychiatry: normal mood, congruent affect, judgement and insight appear normal  Labs   Data Reviewed: I have personally reviewed following labs and imaging studies  CBC: Recent Labs  Lab 07/12/21 0203 07/13/21 0132 07/14/21 0055 07/15/21 0156  WBC 4.7 3.7* 4.1 6.3  NEUTROABS 3.7 2.4 3.2 4.6  HGB 9.3* 8.9* 9.0* 9.2*  HCT 28.9* 26.7* 26.8* 27.8*  MCV 93.5 90.5 91.2 91.7  PLT 300 286 276 [id number]   Basic Metabolic Panel: Recent Labs  Lab 07/09/21 0058 07/10/21 0138 07/12/21 0203 07/13/21 0132 07/14/21 0055 07/15/21 0156  NA 132* 131* 130* 132* 130* 133*  K 4.3 3.8 4.0 4.2 5.1 4.6  CL 97* 96* 96* 99 99 100  CO2 '24 27 26 26 23 25  '$ GLUCOSE 111* 105* 129* 141* 178* 141*  BUN '17 18 22 '$ 27* 28* 29*  CREATININE 0.96 0.94 0.95 1.01* 1.00 0.93  CALCIUM 8.4* 8.2* 8.4* 8.3* 8.2* 8.4*  MG 2.0 2.0  --   --   --   --    GFR: Estimated Creatinine Clearance: 40.5 mL/min (by C-G formula based on SCr of 0.93  mg/dL). Liver Function Tests: Recent Labs  Lab 07/12/21 0203 07/13/21 0132 07/14/21 0055 07/15/21 0156  AST 37 34 82* 50*  ALT '27 26 28 '$ 34  ALKPHOS 33* 31* 31* 31*  BILITOT 0.7 0.6 1.4* 0.7  PROT 6.6 6.3* 6.0* 6.1*  ALBUMIN 2.7* 2.6* 2.6* 2.6*   No results for input(s): LIPASE, AMYLASE in the last 168 hours. No results for input(s): AMMONIA in the last 168 hours. Coagulation Profile: No results for input(s): INR, PROTIME in the last 168 hours. Cardiac Enzymes: No results for input(s): CKTOTAL, CKMB, CKMBINDEX, TROPONINI in the last 168 hours. BNP (last 3 results) No results for input(s): PROBNP in the last 8760 hours. HbA1C: No results for input(s): HGBA1C in the last 72 hours. CBG: No results for input(s): GLUCAP in the last 168 hours. Lipid Profile: No results for input(s): CHOL, HDL, LDLCALC, TRIG, CHOLHDL, LDLDIRECT in the last 72 hours. Thyroid Function Tests: No results for input(s): TSH, T4TOTAL, FREET4, T3FREE, THYROIDAB in the last 72 hours. Anemia Panel: No results for input(s): VITAMINB12, FOLATE, FERRITIN, TIBC, IRON, RETICCTPCT in the last 72 hours. Sepsis Labs: No results for input(s): PROCALCITON, LATICACIDVEN in the last 168 hours.   Recent Results (from the past 240 hour(s))  SARS CORONAVIRUS 2 (TAT 6-24 HRS) Nasopharyngeal Nasopharyngeal Swab     Status: Abnormal   Collection Time: 07/10/21  1:36 PM   Specimen: Nasopharyngeal Swab  Result Value Ref Range Status   SARS Coronavirus 2 POSITIVE (A) NEGATIVE Final    Comment: (NOTE) SARS-CoV-2 target nucleic acids are DETECTED.  The SARS-CoV-2 RNA is generally detectable in upper and lower respiratory specimens during the acute phase of infection. Positive results are indicative of the presence of SARS-CoV-2 RNA. Clinical correlation with patient history and other diagnostic information is  necessary to determine patient infection status. Positive results do not rule out bacterial infection or  co-infection  with other viruses.  The expected result is Negative.  Fact Sheet for Patients: SugarRoll.be  Fact Sheet for Healthcare Providers: https://www.woods-mathews.com/  This test is not yet approved or cleared by the [place] FDA and  has been authorized for detection and/or diagnosis of SARS-CoV-2 by FDA under an Emergency Use Authorization (EUA). This EUA will remain  in effect (meaning this test can be used) for the duration of the COVID-19 declaration under Section 564(b)(1) of the Act, 21 U. S.C. section 360bbb-3(b)(1), unless the authorization is terminated or revoked sooner.   Performed at [facility], [address], [place], [place] [address]       Imaging Studies   No results found.   Medications   Scheduled Meds:  vitamin C  500 mg Oral Daily   Chlorhexidine Gluconate Cloth  6 each Topical Daily   dexamethasone  6 mg Oral Q24H   enoxaparin (LOVENOX) injection  40 mg Subcutaneous QHS   feeding supplement  237 mL Oral TID BM   guaiFENesin  1,200 mg Oral BID   Ipratropium-Albuterol  1 puff Inhalation Q6H   irbesartan  37.5 mg Oral Daily   mouth rinse  15 mL Mouth Rinse BID   metoprolol tartrate  12.5 mg Oral BID   multivitamin with minerals  1 tablet Oral Daily   pantoprazole  40 mg Oral BID   zinc sulfate  220 mg Oral Daily   Continuous Infusions:  sodium chloride 500 mL (07/11/21 1647)       LOS: 20 days    Time spent: 25 minutes with greater than 50% spent at bedside in coordination of care.    Ezekiel Slocumb, DO [facility]  07/15/2021, 2:40 PM      If 7PM-7AM, please contact night-coverage. How to contact the [facility] Attending or Consulting provider [place] or covering provider during after hours [place], for this patient?    Check the care team in [facility] and look for a) attending/consulting [facility] provider listed and b) the [facility] team listed Log into [email or web address] and use [facility]'s universal password to access. If you do not have the password, please contact the hospital operator. Locate the [facility] provider you are looking for under [facility] and page to a number that you can be directly reached. If you still have difficulty reaching the provider, please page the [facility] (Director on Call) for the Hospitalists listed on amion for assistance.

## 2021-07-16 DIAGNOSIS — J9601 Acute respiratory failure with hypoxia: Secondary | ICD-10-CM | POA: Diagnosis not present

## 2021-07-16 LAB — COMPREHENSIVE METABOLIC PANEL
ALT: 33 U/L (ref 0–44)
AST: 38 U/L (ref 15–41)
Albumin: 2.6 g/dL — ABNORMAL LOW (ref 3.5–5.0)
Alkaline Phosphatase: 35 U/L — ABNORMAL LOW (ref 38–126)
Anion gap: 6 (ref 5–15)
BUN: 26 mg/dL — ABNORMAL HIGH (ref 8–23)
CO2: 29 mmol/L (ref 22–32)
Calcium: 8.5 mg/dL — ABNORMAL LOW (ref 8.9–10.3)
Chloride: 99 mmol/L (ref 98–111)
Creatinine, Ser: 0.9 mg/dL (ref 0.44–1.00)
GFR, Estimated: >60 mL/min (ref >60)
Glucose, Bld: 86 mg/dL (ref 70–99)
Potassium: 4.6 mmol/L (ref 3.5–5.1)
Sodium: 134 mmol/L — ABNORMAL LOW (ref 135–145)
Total Bilirubin: 0.7 mg/dL (ref 0.3–1.2)
Total Protein: 6 g/dL — ABNORMAL LOW (ref 6.5–8.1)

## 2021-07-16 LAB — CBC WITH DIFFERENTIAL/PLATELET
Abs Immature Granulocytes: 0.07 10*3/uL (ref 0.00–0.07)
Basophils Absolute: 0 10*3/uL (ref 0.0–0.1)
Basophils Relative: 0 %
Eosinophils Absolute: 0 10*3/uL (ref 0.0–0.5)
Eosinophils Relative: 0 %
HCT: 27.5 % — ABNORMAL LOW (ref 36.0–46.0)
Hemoglobin: 9.2 g/dL — ABNORMAL LOW (ref 12.0–15.0)
Immature Granulocytes: 1 %
Lymphocytes Relative: 23 %
Lymphs Abs: 1.6 10*3/uL (ref 0.7–4.0)
MCH: 30.5 pg (ref 26.0–34.0)
MCHC: 33.5 g/dL (ref 30.0–36.0)
MCV: 91.1 fL (ref 80.0–100.0)
Monocytes Absolute: 0.9 10*3/uL (ref 0.1–1.0)
Monocytes Relative: 12 %
Neutro Abs: 4.7 10*3/uL (ref 1.7–7.7)
Neutrophils Relative %: 64 %
Platelets: 267 10*3/uL (ref 150–400)
RBC: 3.02 MIL/uL — ABNORMAL LOW (ref 3.87–5.11)
RDW: 15.4 % (ref 11.5–15.5)
WBC: 7.3 10*3/uL (ref 4.0–10.5)
nRBC: 0 % (ref 0.0–0.2)

## 2021-07-16 LAB — C-REACTIVE PROTEIN: CRP: 2.5 mg/dL — ABNORMAL HIGH (ref <1.0)

## 2021-07-16 LAB — D-DIMER, QUANTITATIVE: D-Dimer, Quant: 3.27 ug/mL-FEU — ABNORMAL HIGH (ref 0.00–0.50)

## 2021-07-16 NOTE — Progress Notes (Signed)
PROGRESS NOTE    Mary Strickland   [id number]  DOB: December 13, 1929  PCP: Burnard Bunting, MD    DOA: 06/24/2021 LOS: 21   Assessment & Plan   Principal Problem:   Acute hypoxemic respiratory failure (HCC) Active Problems:   Hypertension   Hemoptysis   Abnormal CXR   Leukocytosis   Subcutaneous emphysema (HCC)   Pneumoperitoneum of unknown etiology   Pneumothorax on right   Acute Respiratory Failure with hypoxia in the setting of  Multifocal pneumonia due to COVID-19 (diagnosed 8/8),  Left Pneumothorax with Pneumomediastinum (resolved), Hemoptysis (resolved), Stable on 2-3 L/min oxygen most recently. Initially taken to the ICU intubated 7/23, extubated 7/25.   Left-sided chest tube placed 7/25, removed 7/28 Completed 10-day Unasyn course on 8/2. - Completed remdesivir x 5 days on 8/13 --Continue Decadron  -- Supportive care: albuterol, IS & flutter  --Vitamin C and zinc ordered.   --Mucinex as needed - PT recommended SNF; earliest d/c 8/19 due to 10 d isolation --Wean oxygen as tolerated, maintain sat above 90%    Elevated troponin -Cardiology was  consulted. Elevation of troponin likely related to Respiratory failure Pneumothorax and pneumomediastinum, demand ischemia from CAD. - Echo 7/25-EF 60 to 65% - On metoprolol 12.5 mg twice daily   Malpositioned left IJ CTA neck 7/27 no acute or focal vascular injury or pseudoaneurysmal -Seen by vascular, removed    Essential hypertension -Currently on metoprolol.  Home CCB on hold   Acute urinary retention - Failed voiding trial.  Foley catheter reinserted 8/2. Foley removed 8/6. Periodic bladder scan   Seen by palliative care team  Obesity: Body mass index is 31.46 kg/m. Complicates overall care and prognosis.  Recommend lifestyle modifications including physical activity and diet for weight loss and overall long-term health.    DVT prophylaxis: enoxaparin (LOVENOX) injection 40 mg Start: 07/14/21  2200 SCDs Start: 06/25/21 0310   Diet:  Diet Orders (From admission, onward)     Start     Ordered   07/07/21 1725  Diet regular Room service appropriate? Yes with Assist; Fluid consistency: Thin  Diet effective now       Question Answer Comment  Room service appropriate? Yes with Assist   Fluid consistency: Thin      07/07/21 1725              Code Status: DNR   Brief Narrative / Hospital Course to Date:   87 year old prior history of smoking, hypertension, mitral valve prolapse who was admitted with acute onset hemoptysis.  In the ED, she was initially hemodynamically stable with no acute findings seen on chest x-ray.  While in the ED she had a sudden drop of oxygen saturation to the 80s and demonstrated altered mental status/confusion, prompting endotracheal intubation on 06/24/2021.  CTA obtained post intubation revealed pneumomediastinum and left pneumothorax.  Patient was admitted by [facility], she had a left-sided chest tube placed on 7/25.  She was subsequently extubated on 7/25.  She has been off of IV pressors.  CTA chest was negative for PE, showed extensive pneumomediastinum throughout the chest extending into the base of the neck, question a degree of pneumopericardium as well.   Patient care was transferred to [facility] on 7/28 and subsequently developed hemoptysis and worsening hypoxemia.  PCCM discussed with family and plan was for more comfort care approach.  Chest tube was removed 7/28.  Patient subsequently became more stable requiring less oxygen supplementation.    Palliative care was consulted and plan is  for conservative measure of care and transition patient to rehab. While awaiting placement, her screening COVID test returned positive.  She was coughing with mild infiltrate and slight hypoxia therefore started on routine treatment remdesivir, steroids and bronchodilators.  Subjective 07/16/21    Pt awake sitting up in bed when seen this morning.  She reports "I'm here".   Overall says feeling well.  Tired of being in the hospital.  Remains on 2-3 L oxygen.  Denies F/C, SOB, CP or other complaints.  Says appetite is fair.   Disposition Plan & Communication   Status is: Inpatient  Remains inpatient appropriate because:   On treatment for Covid-19 and requires isolation prior to SNF d/c.  Dispo: The patient is from: Home              Anticipated d/c is to: SNF              Patient currently is not medically stable to d/c.   Difficult to place patient No    Consults, Procedures, Significant Events   Consultants:  PCCM  Procedures:  Intubation, extubation Chest tube placement, removal  Antimicrobials:  Anti-infectives (From admission, onward)    Start     Dose/Rate Route Frequency Ordered Stop   07/12/21 1000  remdesivir 100 mg in sodium chloride 0.9 % 100 mL IVPB       See Hyperspace for full Linked Orders Report.   100 mg 200 mL/hr over 30 Minutes Intravenous Daily 07/11/21 1221 07/15/21 0906   07/11/21 1315  remdesivir 200 mg in sodium chloride 0.9% 250 mL IVPB       See Hyperspace for full Linked Orders Report.   200 mg 580 mL/hr over 30 Minutes Intravenous Once 07/11/21 1221 07/11/21 1722   06/27/21 1400  Ampicillin-Sulbactam (UNASYN) 3 g in sodium chloride 0.9 % 100 mL IVPB        3 g 200 mL/hr over 30 Minutes Intravenous Every 8 hours 06/27/21 0820 07/05/21 2356   06/26/21 1600  Ampicillin-Sulbactam (UNASYN) 3 g in sodium chloride 0.9 % 100 mL IVPB  Status:  Discontinued        3 g 200 mL/hr over 30 Minutes Intravenous Every 12 hours 06/26/21 1114 06/27/21 0820   06/25/21 0500  meropenem (MERREM) 1 g in sodium chloride 0.9 % 100 mL IVPB  Status:  Discontinued        1 g 200 mL/hr over 30 Minutes Intravenous Every 12 hours 06/25/21 0427 06/26/21 1114   06/25/21 0400  levofloxacin (LEVAQUIN) IVPB 750 mg  Status:  Discontinued        750 mg 100 mL/hr over 90 Minutes Intravenous Every 48 hours 06/25/21 0314 06/25/21 0427   06/25/21 0400   ceFEPIme (MAXIPIME) 2 g in sodium chloride 0.9 % 100 mL IVPB  Status:  Discontinued        2 g 200 mL/hr over 30 Minutes Intravenous Every 12 hours 06/25/21 0337 06/25/21 0427         Micro    Objective   Vitals:   07/16/21 0000 07/16/21 0600 07/16/21 0800 07/16/21 1300  BP: (!) 153/61 (!) 107/48 (!) 136/56 (!) 147/67  Pulse: 69 (!) 58 66 70  Resp: (!) '23 20 14 20  '$ Temp:  98 F (36.7 C) 98.6 F (37 C) 98.1 F (36.7 C)  TempSrc:  Oral Oral Oral  SpO2: 99% 99% 96% 99%  Weight:      Height:        Intake/Output Summary (  Last 24 hours) at 07/16/2021 1856 Last data filed at 07/16/2021 1335 Gross per 24 hour  Intake 240 ml  Output 1225 ml  Net -985 ml   Filed Weights   07/13/21 0607 07/14/21 0337 07/15/21 0327  Weight: 91.8 kg 77.6 kg 78 kg    Physical Exam:  General exam: awake, alert, no acute distress Respiratory system: CTAB, normal respiratory effort, on 3 L/min oxygen. Cardiovascular system: normal S1/S2, RRR.   Central nervous system: A&O x3.  Normal speech, grossly nonfocal exam Psychiatry: normal mood, congruent affect, judgement and insight appear normal  Labs   Data Reviewed: I have personally reviewed following labs and imaging studies  CBC: Recent Labs  Lab 07/12/21 0203 07/13/21 0132 07/14/21 0055 07/15/21 0156 07/16/21 0019  WBC 4.7 3.7* 4.1 6.3 7.3  NEUTROABS 3.7 2.4 3.2 4.6 4.7  HGB 9.3* 8.9* 9.0* 9.2* 9.2*  HCT 28.9* 26.7* 26.8* 27.8* 27.5*  MCV 93.5 90.5 91.2 91.7 91.1  PLT 300 286 276 248 [id number]   Basic Metabolic Panel: Recent Labs  Lab 07/10/21 0138 07/12/21 0203 07/13/21 0132 07/14/21 0055 07/15/21 0156 07/16/21 0019  NA 131* 130* 132* 130* 133* 134*  K 3.8 4.0 4.2 5.1 4.6 4.6  CL 96* 96* 99 99 100 99  CO2 '27 26 26 23 25 29  '$ GLUCOSE 105* 129* 141* 178* 141* 86  BUN 18 22 27* 28* 29* 26*  CREATININE 0.94 0.95 1.01* 1.00 0.93 0.90  CALCIUM 8.2* 8.4* 8.3* 8.2* 8.4* 8.5*  MG 2.0  --   --   --   --   --    GFR: Estimated  Creatinine Clearance: 41.8 mL/min (by C-G formula based on SCr of 0.9 mg/dL). Liver Function Tests: Recent Labs  Lab 07/12/21 0203 07/13/21 0132 07/14/21 0055 07/15/21 0156 07/16/21 0019  AST 37 34 82* 50* 38  ALT '27 26 28 '$ 34 33  ALKPHOS 33* 31* 31* 31* 35*  BILITOT 0.7 0.6 1.4* 0.7 0.7  PROT 6.6 6.3* 6.0* 6.1* 6.0*  ALBUMIN 2.7* 2.6* 2.6* 2.6* 2.6*   No results for input(s): LIPASE, AMYLASE in the last 168 hours. No results for input(s): AMMONIA in the last 168 hours. Coagulation Profile: No results for input(s): INR, PROTIME in the last 168 hours. Cardiac Enzymes: No results for input(s): CKTOTAL, CKMB, CKMBINDEX, TROPONINI in the last 168 hours. BNP (last 3 results) No results for input(s): PROBNP in the last 8760 hours. HbA1C: No results for input(s): HGBA1C in the last 72 hours. CBG: No results for input(s): GLUCAP in the last 168 hours. Lipid Profile: No results for input(s): CHOL, HDL, LDLCALC, TRIG, CHOLHDL, LDLDIRECT in the last 72 hours. Thyroid Function Tests: No results for input(s): TSH, T4TOTAL, FREET4, T3FREE, THYROIDAB in the last 72 hours. Anemia Panel: No results for input(s): VITAMINB12, FOLATE, FERRITIN, TIBC, IRON, RETICCTPCT in the last 72 hours. Sepsis Labs: No results for input(s): PROCALCITON, LATICACIDVEN in the last 168 hours.   Recent Results (from the past 240 hour(s))  SARS CORONAVIRUS 2 (TAT 6-24 HRS) Nasopharyngeal Nasopharyngeal Swab     Status: Abnormal   Collection Time: 07/10/21  1:36 PM   Specimen: Nasopharyngeal Swab  Result Value Ref Range Status   SARS Coronavirus 2 POSITIVE (A) NEGATIVE Final    Comment: (NOTE) SARS-CoV-2 target nucleic acids are DETECTED.  The SARS-CoV-2 RNA is generally detectable in upper and lower respiratory specimens during the acute phase of infection. Positive results are indicative of the presence of SARS-CoV-2 RNA. Clinical correlation with patient  history and other diagnostic information is   necessary to determine patient infection status. Positive results do not rule out bacterial infection or co-infection with other viruses.  The expected result is Negative.  Fact Sheet for Patients: SugarRoll.be  Fact Sheet for Healthcare Providers: https://www.woods-mathews.com/  This test is not yet approved or cleared by the [place] FDA and  has been authorized for detection and/or diagnosis of SARS-CoV-2 by FDA under an Emergency Use Authorization (EUA). This EUA will remain  in effect (meaning this test can be used) for the duration of the COVID-19 declaration under Section 564(b)(1) of the Act, 21 U. S.C. section 360bbb-3(b)(1), unless the authorization is terminated or revoked sooner.   Performed at [facility], [place] [address], [place], [place] [address]       Imaging Studies   No results found.   Medications   Scheduled Meds:  vitamin C  500 mg Oral Daily   Chlorhexidine Gluconate Cloth  6 each Topical Daily   dexamethasone  6 mg Oral Q24H   enoxaparin (LOVENOX) injection  40 mg Subcutaneous QHS   feeding supplement  237 mL Oral TID BM   guaiFENesin  1,200 mg Oral BID   Ipratropium-Albuterol  1 puff Inhalation Q6H   irbesartan  37.5 mg Oral Daily   mouth rinse  15 mL Mouth Rinse BID   metoprolol tartrate  12.5 mg Oral BID   multivitamin with minerals  1 tablet Oral Daily   pantoprazole  40 mg Oral BID   zinc sulfate  220 mg Oral Daily   Continuous Infusions:  sodium chloride 500 mL (07/11/21 1647)       LOS: 21 days    Time spent: 25 minutes with greater than 50% spent at bedside in coordination of care.    Ezekiel Slocumb, DO [facility]  07/16/2021, 6:56 PM      If 7PM-7AM, please contact night-coverage. How to contact the [facility] Attending or Consulting provider [place] or covering provider during after hours [place], for this patient?    Check the care team in [facility] and look for a)  attending/consulting [facility] provider listed and b) the [facility] team listed Log into [email or web address] and use [place] universal password to access. If you do not have the password, please contact the hospital operator. Locate the [facility] provider you are looking for under [facility] and page to a number that you can be directly reached. If you still have difficulty reaching the provider, please page the [facility] (Director on Call) for the Hospitalists listed on amion for assistance.

## 2021-07-17 ENCOUNTER — Inpatient Hospital Stay (HOSPITAL_COMMUNITY): Payer: Medicare HMO

## 2021-07-17 DIAGNOSIS — J9601 Acute respiratory failure with hypoxia: Secondary | ICD-10-CM | POA: Diagnosis not present

## 2021-07-17 LAB — COMPREHENSIVE METABOLIC PANEL
ALT: 28 U/L (ref 0–44)
AST: 31 U/L (ref 15–41)
Albumin: 2.7 g/dL — ABNORMAL LOW (ref 3.5–5.0)
Alkaline Phosphatase: 37 U/L — ABNORMAL LOW (ref 38–126)
Anion gap: 7 (ref 5–15)
BUN: 27 mg/dL — ABNORMAL HIGH (ref 8–23)
CO2: 27 mmol/L (ref 22–32)
Calcium: 8.6 mg/dL — ABNORMAL LOW (ref 8.9–10.3)
Chloride: 99 mmol/L (ref 98–111)
Creatinine, Ser: 0.89 mg/dL (ref 0.44–1.00)
GFR, Estimated: >60 mL/min (ref >60)
Glucose, Bld: 84 mg/dL (ref 70–99)
Potassium: 4.2 mmol/L (ref 3.5–5.1)
Sodium: 133 mmol/L — ABNORMAL LOW (ref 135–145)
Total Bilirubin: 0.9 mg/dL (ref 0.3–1.2)
Total Protein: 6.2 g/dL — ABNORMAL LOW (ref 6.5–8.1)

## 2021-07-17 LAB — C-REACTIVE PROTEIN: CRP: 1.3 mg/dL — ABNORMAL HIGH (ref <1.0)

## 2021-07-17 MED ORDER — IPRATROPIUM-ALBUTEROL 20-100 MCG/ACT IN AERS
1.0000 | INHALATION_SPRAY | Freq: Four times a day (QID) | RESPIRATORY_TRACT | Status: DC | PRN
  Administered 2021-07-23 – 2021-07-31 (×2): 1 via RESPIRATORY_TRACT

## 2021-07-17 NOTE — Progress Notes (Signed)
PROGRESS NOTE    Mary Strickland   [id number]  DOB: 18-Oct-1930  PCP: Burnard Bunting, MD    DOA: 06/24/2021 LOS: 22   Assessment & Plan   Principal Problem:   Acute hypoxemic respiratory failure (HCC) Active Problems:   Hypertension   Hemoptysis   Abnormal CXR   Leukocytosis   Subcutaneous emphysema (HCC)   Pneumoperitoneum of unknown etiology   Pneumothorax on right   Acute Respiratory Failure with hypoxia in the setting of  Multifocal pneumonia due to COVID-19 (diagnosed 8/8),  Left Pneumothorax with Pneumomediastinum (resolved), Hemoptysis (resolved), Stable on 2-3 L/min oxygen most recently. Initially taken to the ICU intubated 7/23, extubated 7/25.   Left-sided chest tube placed 7/25, removed 7/28 Completed 10-day Unasyn course on 8/2. 8/15 -obtain repeat chest x-ray as patient has continued to be on 3 to 4 L/min supplemental oxygen.  Charted O2 sats however in the upper 90s to 100%. Chest x-ray showed grossly stable bilateral lung opacities are noted with probable small bilateral pleural effusions. - Completed remdesivir x 5 days on 8/13 --Continue Decadron  -- Supportive care: albuterol, IS & flutter  --Vitamin C and zinc ordered.   --Mucinex as needed - PT recommended SNF; earliest d/c 8/19 due to 10 d isolation --Wean oxygen as tolerated, maintain sat above 90%    Elevated troponin -Cardiology was  consulted. Elevation of troponin likely related to Respiratory failure Pneumothorax and pneumomediastinum, demand ischemia from CAD. - Echo 7/25-EF 60 to 65% - On metoprolol 12.5 mg twice daily   Malpositioned left IJ CTA neck 7/27 no acute or focal vascular injury or pseudoaneurysmal -Seen by vascular, removed    Essential hypertension -Currently on metoprolol.  Home CCB on hold   Acute urinary retention - Failed voiding trial.  Foley catheter reinserted 8/2. Foley removed 8/6. Periodic bladder scan   Seen by palliative care team  Obesity: Body  mass index is 31.46 kg/m. Complicates overall care and prognosis.  Recommend lifestyle modifications including physical activity and diet for weight loss and overall long-term health.    DVT prophylaxis: enoxaparin (LOVENOX) injection 40 mg Start: 07/14/21 2200 SCDs Start: 06/25/21 0310   Diet:  Diet Orders (From admission, onward)     Start     Ordered   07/07/21 1725  Diet regular Room service appropriate? Yes with Assist; Fluid consistency: Thin  Diet effective now       Question Answer Comment  Room service appropriate? Yes with Assist   Fluid consistency: Thin      07/07/21 1725              Code Status: DNR   Brief Narrative / Hospital Course to Date:   87 year old prior history of smoking, hypertension, mitral valve prolapse who was admitted with acute onset hemoptysis.  In the ED, she was initially hemodynamically stable with no acute findings seen on chest x-ray.  While in the ED she had a sudden drop of oxygen saturation to the 80s and demonstrated altered mental status/confusion, prompting endotracheal intubation on 06/24/2021.  CTA obtained post intubation revealed pneumomediastinum and left pneumothorax.  Patient was admitted by [facility] she had a left-sided chest tube placed on 7/25.  She was subsequently extubated on 7/25.  She has been off of IV pressors.  CTA chest was negative for PE, showed extensive pneumomediastinum throughout the chest extending into the base of the neck, question a degree of pneumopericardium as well.   Patient care was transferred to [facility] on 7/28 and  subsequently developed hemoptysis and worsening hypoxemia.  PCCM discussed with family and plan was for more comfort care approach.  Chest tube was removed 7/28.  Patient subsequently became more stable requiring less oxygen supplementation.    Palliative care was consulted and plan is for conservative measure of care and transition patient to rehab. While awaiting placement, her screening COVID test  returned positive.  She was coughing with mild infiltrate and slight hypoxia therefore started on routine treatment remdesivir, steroids and bronchodilators.  Subjective 07/17/21    Pt just finishing working with therapy and up in the recliner chair.  She reports feeling overall well.  Says she has an occasional cough.  Denies fevers or chills.  No acute complaints.  No acute events reported.   Disposition Plan & Communication   Status is: Inpatient  Remains inpatient appropriate because:   On treatment for Covid-19 and requires isolation prior to SNF d/c.  Dispo: The patient is from: Home              Anticipated d/c is to: SNF              Patient currently is not medically stable to d/c.   Difficult to place patient No    Consults, Procedures, Significant Events   Consultants:  PCCM  Procedures:  Intubation, extubation Chest tube placement, removal  Antimicrobials:  Anti-infectives (From admission, onward)    Start     Dose/Rate Route Frequency Ordered Stop   07/12/21 1000  remdesivir 100 mg in sodium chloride 0.9 % 100 mL IVPB       See Hyperspace for full Linked Orders Report.   100 mg 200 mL/hr over 30 Minutes Intravenous Daily 07/11/21 1221 07/15/21 0906   07/11/21 1315  remdesivir 200 mg in sodium chloride 0.9% 250 mL IVPB       See Hyperspace for full Linked Orders Report.   200 mg 580 mL/hr over 30 Minutes Intravenous Once 07/11/21 1221 07/11/21 1722   06/27/21 1400  Ampicillin-Sulbactam (UNASYN) 3 g in sodium chloride 0.9 % 100 mL IVPB        3 g 200 mL/hr over 30 Minutes Intravenous Every 8 hours 06/27/21 0820 07/05/21 2356   06/26/21 1600  Ampicillin-Sulbactam (UNASYN) 3 g in sodium chloride 0.9 % 100 mL IVPB  Status:  Discontinued        3 g 200 mL/hr over 30 Minutes Intravenous Every 12 hours 06/26/21 1114 06/27/21 0820   06/25/21 0500  meropenem (MERREM) 1 g in sodium chloride 0.9 % 100 mL IVPB  Status:  Discontinued        1 g 200 mL/hr over 30  Minutes Intravenous Every 12 hours 06/25/21 0427 06/26/21 1114   06/25/21 0400  levofloxacin (LEVAQUIN) IVPB 750 mg  Status:  Discontinued        750 mg 100 mL/hr over 90 Minutes Intravenous Every 48 hours 06/25/21 0314 06/25/21 0427   06/25/21 0400  ceFEPIme (MAXIPIME) 2 g in sodium chloride 0.9 % 100 mL IVPB  Status:  Discontinued        2 g 200 mL/hr over 30 Minutes Intravenous Every 12 hours 06/25/21 0337 06/25/21 0427         Micro    Objective   Vitals:   07/17/21 0735 07/17/21 1000 07/17/21 1211 07/17/21 1503  BP: 126/60  (!) 135/50 (!) 123/51  Pulse: 62 74 65 70  Resp: 18 (!) '21 15 18  '$ Temp: 98.4 F (36.9 C)  97.8 F (  36.6 C) 98 F (36.7 C)  TempSrc: Oral  Oral Oral  SpO2: 99% 91% 96% 99%  Weight:      Height:        Intake/Output Summary (Last 24 hours) at 07/17/2021 1740 Last data filed at 07/17/2021 1003 Gross per 24 hour  Intake --  Output 1050 ml  Net -1050 ml   Filed Weights   07/13/21 0607 07/14/21 0337 07/15/21 0327  Weight: 91.8 kg 77.6 kg 78 kg    Physical Exam:  General exam: Up in recliner, awake, alert, no acute distress Respiratory system: CTAB, normal respiratory effort, on 2 L/min oxygen.  No wheezes or rhonchi. Cardiovascular system: normal S1/S2, RRR.   Central nervous system: A&O x3.  Normal speech, grossly nonfocal exam GI -abdomen soft and nontender Psychiatry: normal mood, congruent affect, judgement and insight appear normal  Labs   Data Reviewed: I have personally reviewed following labs and imaging studies  CBC: Recent Labs  Lab 07/12/21 0203 07/13/21 0132 07/14/21 0055 07/15/21 0156 07/16/21 0019  WBC 4.7 3.7* 4.1 6.3 7.3  NEUTROABS 3.7 2.4 3.2 4.6 4.7  HGB 9.3* 8.9* 9.0* 9.2* 9.2*  HCT 28.9* 26.7* 26.8* 27.8* 27.5*  MCV 93.5 90.5 91.2 91.7 91.1  PLT 300 286 276 248 [id number]   Basic Metabolic Panel: Recent Labs  Lab 07/13/21 0132 07/14/21 0055 07/15/21 0156 07/16/21 0019 07/17/21 0517  NA 132* 130* 133* 134*  133*  K 4.2 5.1 4.6 4.6 4.2  CL 99 99 100 99 99  CO2 '26 23 25 29 27  '$ GLUCOSE 141* 178* 141* 86 84  BUN 27* 28* 29* 26* 27*  CREATININE 1.01* 1.00 0.93 0.90 0.89  CALCIUM 8.3* 8.2* 8.4* 8.5* 8.6*   GFR: Estimated Creatinine Clearance: 42.3 mL/min (by C-G formula based on SCr of 0.89 mg/dL). Liver Function Tests: Recent Labs  Lab 07/13/21 0132 07/14/21 0055 07/15/21 0156 07/16/21 0019 07/17/21 0517  AST 34 82* 50* 38 31  ALT 26 28 34 33 28  ALKPHOS 31* 31* 31* 35* 37*  BILITOT 0.6 1.4* 0.7 0.7 0.9  PROT 6.3* 6.0* 6.1* 6.0* 6.2*  ALBUMIN 2.6* 2.6* 2.6* 2.6* 2.7*   No results for input(s): LIPASE, AMYLASE in the last 168 hours. No results for input(s): AMMONIA in the last 168 hours. Coagulation Profile: No results for input(s): INR, PROTIME in the last 168 hours. Cardiac Enzymes: No results for input(s): CKTOTAL, CKMB, CKMBINDEX, TROPONINI in the last 168 hours. BNP (last 3 results) No results for input(s): PROBNP in the last 8760 hours. HbA1C: No results for input(s): HGBA1C in the last 72 hours. CBG: No results for input(s): GLUCAP in the last 168 hours. Lipid Profile: No results for input(s): CHOL, HDL, LDLCALC, TRIG, CHOLHDL, LDLDIRECT in the last 72 hours. Thyroid Function Tests: No results for input(s): TSH, T4TOTAL, FREET4, T3FREE, THYROIDAB in the last 72 hours. Anemia Panel: No results for input(s): VITAMINB12, FOLATE, FERRITIN, TIBC, IRON, RETICCTPCT in the last 72 hours. Sepsis Labs: No results for input(s): PROCALCITON, LATICACIDVEN in the last 168 hours.   Recent Results (from the past 240 hour(s))  SARS CORONAVIRUS 2 (TAT 6-24 HRS) Nasopharyngeal Nasopharyngeal Swab     Status: Abnormal   Collection Time: 07/10/21  1:36 PM   Specimen: Nasopharyngeal Swab  Result Value Ref Range Status   SARS Coronavirus 2 POSITIVE (A) NEGATIVE Final    Comment: (NOTE) SARS-CoV-2 target nucleic acids are DETECTED.  The SARS-CoV-2 RNA is generally detectable in upper  and lower respiratory specimens  during the acute phase of infection. Positive results are indicative of the presence of SARS-CoV-2 RNA. Clinical correlation with patient history and other diagnostic information is  necessary to determine patient infection status. Positive results do not rule out bacterial infection or co-infection with other viruses.  The expected result is Negative.  Fact Sheet for Patients: SugarRoll.be  Fact Sheet for Healthcare Providers: https://www.woods-mathews.com/  This test is not yet approved or cleared by the [place] FDA and  has been authorized for detection and/or diagnosis of SARS-CoV-2 by FDA under an Emergency Use Authorization (EUA). This EUA will remain  in effect (meaning this test can be used) for the duration of the COVID-19 declaration under Section 564(b)(1) of the Act, 21 U. S.C. section 360bbb-3(b)(1), unless the authorization is terminated or revoked sooner.   Performed at [facility], [place] [address] [place], [place] [address]       Imaging Studies   DG CHEST PORT 1 VIEW  Result Date: 07/17/2021 CLINICAL DATA:  Shortness of breath, hemoptysis. EXAM: PORTABLE CHEST 1 VIEW COMPARISON:  July 05, 2021. FINDINGS: Stable cardiomediastinal silhouette. No pneumothorax is noted. Stable peripheral right upper lobe opacity is noted. Stable bibasilar opacities are noted. No definite pneumothorax is noted. Probable small pleural effusions are noted. Bony thorax is unremarkable. IMPRESSION: Grossly stable bilateral lung opacities are noted with probable small bilateral pleural effusions. Aortic Atherosclerosis (ICD10-I70.0). Electronically Signed   By: Marijo Conception M.D.   On: 07/17/2021 09:04     Medications   Scheduled Meds:  vitamin C  500 mg Oral Daily   Chlorhexidine Gluconate Cloth  6 each Topical Daily   dexamethasone  6 mg Oral Q24H   enoxaparin (LOVENOX) injection  40 mg  Subcutaneous QHS   feeding supplement  237 mL Oral TID BM   guaiFENesin  1,200 mg Oral BID   irbesartan  37.5 mg Oral Daily   mouth rinse  15 mL Mouth Rinse BID   metoprolol tartrate  12.5 mg Oral BID   multivitamin with minerals  1 tablet Oral Daily   pantoprazole  40 mg Oral BID   zinc sulfate  220 mg Oral Daily   Continuous Infusions:  sodium chloride 500 mL (07/11/21 1647)       LOS: 22 days    Time spent: 25 minutes with greater than 50% spent at bedside in coordination of care.    Ezekiel Slocumb, DO [facility]  07/17/2021, 5:40 PM      If 7PM-7AM, please contact night-coverage. How to contact the [facility] Attending or Consulting provider [place] or covering provider during after hours [place], for this patient?    Check the care team in [facility] and look for a) attending/consulting TR[facility] provider listed and b) the [facility] team listed Log into [email or web address] and use [place]'s universal password to access. If you do not have the password, please contact the hospital operator. Locate the [facility] provider you are looking for under [facility] and page to a number that you can be directly reached. If you still have difficulty reaching the provider, please page the [facility] (Director on Call) for the Hospitalists listed on amion for assistance.

## 2021-07-17 NOTE — Progress Notes (Signed)
Occupational Therapy Treatment Patient Details Name: Mary Strickland MRN: [id number] DOB: 08-Dec-1929 Today's Date: 07/17/2021    History of present illness 87 y.o. female presents to [place] [facility] ED on 06/24/2021 with sudden onset hemoptysis. Pt experienced a bout of hypoxia in ED with AMS and was intubated on 7/23. Pt was transported to [facility] hospital on 7/24, found to have pneumomediastinum, pneumothorax. Chest tube inserted 7/24. Pt extubated on 7/25. Now covid+ as of 07/10/21. PMH includes history of smoking, hypertension, mitral valve prolapse.   OT comments  Pt progressing gradually towards OT goals, able to demo short mobility in room at min guard using RW and improving steadiness. Pt remains limited by fatigued and cardiopulmonary tolerance deficits though able to be titrated to 2 L O2 during session. Pt benefits from energy conservation strategies during ADLs. Plan to progress ADL/mobility endurance, wean O2 as appropriate and maximize independence.    Follow Up Recommendations  SNF    Equipment Recommendations  Other (comment) (Rolling walker)    Recommendations for Other Services      Precautions / Restrictions Precautions Precautions: Fall Precaution Comments: Airborne/Contact precs (Covid+ 8/8) Restrictions Weight Bearing Restrictions: No       Mobility Bed Mobility Overal bed mobility: Needs Assistance Bed Mobility: Supine to Sit     Supine to sit: HOB elevated;Supervision     General bed mobility comments: use of bed rails, increased time and effort.    Transfers Overall transfer level: Needs assistance Equipment used: Rolling walker (2 wheeled) Transfers: Sit to/from Stand Sit to Stand: Supervision         General transfer comment: supervision with increased time to power up, minor cues for hand placement    Balance Overall balance assessment: Needs assistance Sitting-balance support: Feet supported Sitting balance-[name] Scale: Good     Standing  balance support: Bilateral upper extremity supported;During functional activity Standing balance-[name] Scale: Fair Standing balance comment: fair static standing at sink, use of BUE support for mobility needed                           ADL either performed or assessed with clinical judgement   ADL Overall ADL's : Needs assistance/impaired     Grooming: Min guard;Standing;Wash/dry face;Oral care;Brushing hair Grooming Details (indicate cue type and reason): Pt able to stand to wash face/hands thoroughly. did perform brushing teeth seated due to fatigue. OT assisting in putting pt hair in ponytail to get out of face                             Functional mobility during ADLs: Min guard;Rolling walker General ADL Comments: Pt still feels fatigued, weakness in standing but able to maintain spO2 sats with titration and stand for ADL tasks longer today     Vision   Vision Assessment?: No apparent visual deficits   Perception     Praxis      Cognition Arousal/Alertness: Awake/alert Behavior During Therapy: WFL for tasks assessed/performed Overall Cognitive Status: No family/caregiver present to determine baseline cognitive functioning                                 General Comments: WFL for basic tasks though noted with some increased confusion and difficulty problem solving, safety techniques, etc        Exercises     Shoulder Instructions  General Comments Received on 4 L O2. Pt has consistently had WFL O2 readings with 4 L O2 so OT titrated to 2 L O2 with pt able to maintain 90% and above. Pt does desat with RA (84% with questionable pleth) and minimal activities at sink. Pt with good carryover of pursed lip breathing. O2 probe changed at end of session with improved pleth signal    Pertinent Vitals/ Pain       Pain Assessment: No/denies pain  Home Living                                          Prior  Functioning/Environment              Frequency  Min 2X/week        Progress Toward Goals  OT Goals(current goals can now be found in the care plan section)  Progress towards OT goals: Progressing toward goals  Acute Rehab OT Goals Patient Stated Goal: get better OT Goal Formulation: With patient Time For Goal Achievement: 07/31/21 Potential to Achieve Goals: Fair ADL Goals Pt Will Perform Grooming: with supervision;standing Pt Will Perform Lower Body Bathing: with set-up;sit to/from stand Pt Will Perform Lower Body Dressing: with set-up;sit to/from stand Pt Will Transfer to Toilet: with supervision;ambulating;grab bars Pt Will Perform Toileting - Clothing Manipulation and hygiene: Independently Additional ADL Goal #1: Pt will tolerate OOB functional acutivity for at least 5 minutes to progress indep towards ADLs  Plan Discharge plan remains appropriate    Co-evaluation                 AM-PAC OT "6 Clicks" Daily Activity     Outcome Measure   Help from another person eating meals?: None Help from another person taking care of personal grooming?: A Little Help from another person toileting, which includes using toliet, bedpan, or urinal?: A Little Help from another person bathing (including washing, rinsing, drying)?: A Lot Help from another person to put on and taking off regular upper body clothing?: A Little Help from another person to put on and taking off regular lower body clothing?: A Lot 6 Click Score: 17    End of Session Equipment Utilized During Treatment: Rolling walker;Oxygen  OT Visit Diagnosis: Unsteadiness on feet (R26.81);Muscle weakness (generalized) (M62.81);Pain   Activity Tolerance Patient tolerated treatment well   Patient Left in chair;with call bell/phone within reach;with chair alarm set   Nurse Communication Mobility status;Other (comment) (O2)        Time: [id number] OT Time Calculation (min): 50 min  Charges: OT General  Charges $OT Visit: 1 Visit OT Treatments $Self Care/Home Management : 23-37 mins $Therapeutic Activity: 8-22 mins  Malachy Chamber, OTR/L Acute Rehab Services Office: [phone]    Layla Maw 07/17/2021, 1:55 PM

## 2021-07-17 NOTE — Care Management Important Message (Signed)
Important Message  Patient Details  Name: Mary Strickland MRN: [id number] Date of Birth: 26-Aug-1930   Medicare Important Message Given:  Yes     [name] 07/17/2021, 9:32 AM

## 2021-07-18 DIAGNOSIS — J9601 Acute respiratory failure with hypoxia: Secondary | ICD-10-CM | POA: Diagnosis not present

## 2021-07-18 LAB — C-REACTIVE PROTEIN: CRP: 1.2 mg/dL — ABNORMAL HIGH (ref <1.0)

## 2021-07-18 LAB — D-DIMER, QUANTITATIVE: D-Dimer, Quant: 3.03 ug/mL-FEU — ABNORMAL HIGH (ref 0.00–0.50)

## 2021-07-18 MED ORDER — HYDRALAZINE HCL 10 MG PO TABS
10.0000 mg | ORAL_TABLET | Freq: Four times a day (QID) | ORAL | Status: DC | PRN
  Administered 2021-07-21 – 2021-08-02 (×2): 10 mg via ORAL
  Filled 2021-07-18 (×2): qty 1

## 2021-07-18 NOTE — Progress Notes (Signed)
Physical Therapy Treatment Patient Details Name: Mary Strickland MRN: [id number] DOB: 1930/02/04 Today's Date: 07/18/2021    History of Present Illness 87 y.o. female presents to [place] [facility] ED on 06/24/2021 with sudden onset hemoptysis. Pt experienced a bout of hypoxia in ED with AMS and was intubated on 7/23. Pt was [facility] on [facility]/24, found to have pneumomediastinum, pneumothorax. Chest tube inserted 7/24. Pt extubated on 7/25. Now covid+ as of 07/10/21. PMH includes history of smoking, hypertension, mitral valve prolapse.    PT Comments    Pt received in chair, agreeable to therapy session and with good participation for gait trial ~87f with RW support. Pt SpO2 WNL resting on RA however desat with exertion to 84% so returned to 2L O2 [employer] and WNL seated/with standing activity on 2L and VSS otherwise. Emphasis on seated/standing exercises, repositioning for pressure relief, importance of mobility, and fall risk prevention (use of call bell, etc). Plan to progress gait distance next session if tolerated. Pt continues to benefit from PT services to progress toward functional mobility goals.  Pt making good progress toward goals, will continue to monitor for progress to HHPT if 24/7 assist/supervision available within 1-2 more sessions.  Follow Up Recommendations  SNF;Supervision/Assistance - 24 hour     Equipment Recommendations  Rolling walker with 5" wheels;3in1 (PT)    Recommendations for Other Services       Precautions / Restrictions Precautions Precautions: Fall Precaution Comments: Airborne/Contact precs (Covid+ 8/8) Restrictions Weight Bearing Restrictions: No    Mobility  Bed Mobility               General bed mobility comments: pt received in chair    Transfers Overall transfer level: Needs assistance Equipment used: Rolling walker (2 wheeled) Transfers: Sit to/from Stand Sit to Stand: Supervision         General transfer comment:  supervision with increased time to power up, minor cues for hand placement; x4 reps  Ambulation/Gait Ambulation/Gait assistance: Min guard Gait Distance (Feet): 30 Feet Assistive device: Rolling walker (2 wheeled) Gait Pattern/deviations: Step-to pattern;Decreased step length - right;Decreased step length - left Gait velocity: decreased   General Gait Details: Cues needed for safety, lines and proximity to RW. SpO2 desat with exertion on RA  to 84% so 2L O2 [place] replaced and return to >90% within 1 minute with cues for deep breaths.   Stairs             Wheelchair Mobility    Modified Rankin (Stroke Patients Only)       Balance Overall balance assessment: Needs assistance Sitting-balance support: Feet supported Sitting balance-[name] Scale: Good     Standing balance support: Bilateral upper extremity supported;During functional activity Standing balance-[name] Scale: Fair Standing balance comment: reliant on RW for gait but able to static stand with U UE support and no LOB                            Cognition Arousal/Alertness: Awake/alert Behavior During Therapy: WFL for tasks assessed/performed Overall Cognitive Status: No family/caregiver present to determine baseline cognitive functioning                                 General Comments: WFL for basic tasks though noted with some increased confusion and difficulty problem solving, safety techniques, etc. Pt with improving participation this date compared with session on  8/11.      Exercises Other Exercises Other Exercises: STS x 5 reciprocal reps from chair to RW Other Exercises: standing BLE AROM: marches, heel raises 2x10 reps ea Other Exercises: seated BLE AROM: hip flexion, LAQ, ankle pumps x10 reps ea    General Comments General comments (skin integrity, edema, etc.): BP 154/65 (90) prior to mobility and denies dizziness/lightheadedness; SpO2 WNL on 2L, O2 titrated down and also WNL  resting on RA but desat with exertion (gait task) on RA so [place] replaced to 2L/min and returned to >90% when seated. HR WNL in 70's      Pertinent Vitals/Pain Pain Assessment: No/denies pain Pain Intervention(s): Monitored during session;Repositioned    Home Living                      Prior Function            PT Goals (current goals can now be found in the care plan section) Acute Rehab PT Goals Patient Stated Goal: get better PT Goal Formulation: With patient Time For Goal Achievement: 07/16/21 Potential to Achieve Goals: Good Progress towards PT goals: Progressing toward goals    Frequency    Min 2X/week      PT Plan Current plan remains appropriate    Co-evaluation              AM-PAC PT "6 Clicks" Mobility   Outcome Measure  Help needed turning from your back to your side while in a flat bed without using bedrails?: A Little Help needed moving from lying on your back to sitting on the side of a flat bed without using bedrails?: A Little Help needed moving to and from a bed to a chair (including a wheelchair)?: A Little Help needed standing up from a chair using your arms (e.g., wheelchair or bedside chair)?: A Little Help needed to walk in hospital room?: A Little Help needed climbing 3-5 steps with a railing? : A Lot 6 Click Score: 17    End of Session Equipment Utilized During Treatment: Gait belt;Oxygen Activity Tolerance: Patient tolerated treatment well Patient left: in chair;with call bell/phone within reach;with chair alarm set Nurse Communication: Mobility status;Other (comment) (desat on RA) PT Visit Diagnosis: Muscle weakness (generalized) (M62.81);Other abnormalities of gait and mobility (R26.89);Difficulty in walking, not elsewhere classified (R26.2)     Time: [id number] PT Time Calculation (min) (ACUTE ONLY): 30 min  Charges:  $Gait Training: 8-22 mins $Therapeutic Exercise: 8-22 mins                     Julieanne Hadsall P., PTA Acute  Rehabilitation Services Pager: [phone] Office: [place] 07/18/2021, 6:08 PM

## 2021-07-18 NOTE — Progress Notes (Signed)
SATURATION QUALIFICATIONS: (This note is used to comply with regulatory documentation for home oxygen)  Patient Saturations on Room Air at Rest = 92%  Patient Saturations on Room Air while Ambulating = 84%  Patient Saturations on 2 Liters of oxygen while Ambulating = 93%  Please briefly explain why patient needs home oxygen: patient hypoxic with exertion on room air.

## 2021-07-18 NOTE — Progress Notes (Signed)
PROGRESS NOTE    Mary Strickland   [id number]  DOB: [birth year]  PCP: Burnard Bunting, MD    DOA: 06/24/2021 LOS: 23   Assessment & Plan   Principal Problem:   Acute hypoxemic respiratory failure (HCC) Active Problems:   Hypertension   Hemoptysis   Abnormal CXR   Leukocytosis   Subcutaneous emphysema (HCC)   Pneumoperitoneum of unknown etiology   Pneumothorax on right   Acute Respiratory Failure with hypoxia in the setting of  Multifocal pneumonia due to COVID-19 (diagnosed 8/8),  Left Pneumothorax with Pneumomediastinum (resolved), Hemoptysis (resolved), Stable on 2-3 L/min oxygen most recently. Initially taken to the ICU intubated 7/23, extubated 7/25.   Left-sided chest tube placed 7/25, removed 7/28 Completed 10-day Unasyn course on 8/2. 8/15 -obtain repeat chest x-ray as patient has continued to be on 3 to 4 L/min supplemental oxygen.  Charted O2 sats however in the upper 90s to 100%. Chest x-ray showed grossly stable bilateral lung opacities are noted with probable small bilateral pleural effusions. - Completed remdesivir x 5 days on 8/13 --Continue Decadron  to complete 10 days -- Supportive care: albuterol, IS & flutter  --Vitamin C and zinc ordered.   --Mucinex as needed - PT recommended SNF; earliest d/c 8/19 due to 10 d isolation --Wean oxygen as tolerated, maintain sat above 90%    Elevated troponin -Cardiology was  consulted. Elevation of troponin likely related to Respiratory failure Pneumothorax and pneumomediastinum, demand ischemia from CAD. - Echo 7/25-EF 60 to 65% - On metoprolol 12.5 mg twice daily   Malpositioned left IJ CTA neck 7/27 no acute or focal vascular injury or pseudoaneurysmal -Seen by vascular, removed    Essential hypertension -Currently on metoprolol.  Home CCB on hold   Acute urinary retention - Failed voiding trial.  Foley catheter reinserted 8/2. Foley removed 8/6. Periodic bladder scan   Seen by palliative care  team  Obesity: Body mass index is 31.46 kg/m. Complicates overall care and prognosis.  Recommend lifestyle modifications including physical activity and diet for weight loss and overall long-term health.    DVT prophylaxis: enoxaparin (LOVENOX) injection 40 mg Start: 07/14/21 2200 SCDs Start: 06/25/21 0310   Diet:  Diet Orders (From admission, onward)     Start     Ordered   07/07/21 1725  Diet regular Room service appropriate? Yes with Assist; Fluid consistency: Thin  Diet effective now       Question Answer Comment  Room service appropriate? Yes with Assist   Fluid consistency: Thin      07/07/21 1725              Code Status: DNR   Brief Narrative / Hospital Course to Date:   87 year old prior history of smoking, hypertension, mitral valve prolapse who was admitted with acute onset hemoptysis.  In the ED, she was initially hemodynamically stable with no acute findings seen on chest x-ray.  While in the ED she had a sudden drop of oxygen saturation to the 80s and demonstrated altered mental status/confusion, prompting endotracheal intubation on 06/24/2021.  CTA obtained post intubation revealed pneumomediastinum and left pneumothorax.  Patient was admitted by [facility], she had a left-sided chest tube placed on 7/25.  She was subsequently extubated on 7/25.  She has been off of IV pressors.  CTA chest was negative for PE, showed extensive pneumomediastinum throughout the chest extending into the base of the neck, question a degree of pneumopericardium as well.   Patient care was transferred to  TRH on 7/28 and subsequently developed hemoptysis and worsening hypoxemia.  PCCM discussed with family and plan was for more comfort care approach.  Chest tube was removed 7/28.  Patient subsequently became more stable requiring less oxygen supplementation.    Palliative care was consulted and plan is for conservative measure of care and transition patient to rehab. While awaiting placement, her  screening COVID test returned positive.  She was coughing with mild infiltrate and slight hypoxia therefore started on routine treatment remdesivir, steroids and bronchodilators.  Subjective 07/18/21    Pt doing well today.  Denies any acute complaints.  Asks how she's doing and when she'll get to be discharged. Her daughter is now in the hospital in [place].  She was talking to family on the phone.  Has difficulty with this due to being very hard of hearing.   Disposition Plan & Communication   Status is: Inpatient  Remains inpatient appropriate because:   On treatment for Covid-19 and requires isolation prior to SNF d/c.  Dispo: The patient is from: Home              Anticipated d/c is to: SNF              Patient currently is not medically stable to d/c.   Difficult to place patient No    Consults, Procedures, Significant Events   Consultants:  PCCM  Procedures:  Intubation, extubation Chest tube placement, removal  Antimicrobials:  Anti-infectives (From admission, onward)    Start     Dose/Rate Route Frequency Ordered Stop   07/12/21 1000  remdesivir 100 mg in sodium chloride 0.9 % 100 mL IVPB       See Hyperspace for full Linked Orders Report.   100 mg 200 mL/hr over 30 Minutes Intravenous Daily 07/11/21 1221 07/15/21 0906   07/11/21 1315  remdesivir 200 mg in sodium chloride 0.9% 250 mL IVPB       See Hyperspace for full Linked Orders Report.   200 mg 580 mL/hr over 30 Minutes Intravenous Once 07/11/21 1221 07/11/21 1722   06/27/21 1400  Ampicillin-Sulbactam (UNASYN) 3 g in sodium chloride 0.9 % 100 mL IVPB        3 g 200 mL/hr over 30 Minutes Intravenous Every 8 hours 06/27/21 0820 07/05/21 2356   06/26/21 1600  Ampicillin-Sulbactam (UNASYN) 3 g in sodium chloride 0.9 % 100 mL IVPB  Status:  Discontinued        3 g 200 mL/hr over 30 Minutes Intravenous Every 12 hours 06/26/21 1114 06/27/21 0820   06/25/21 0500  meropenem (MERREM) 1 g in sodium chloride 0.9 % 100  mL IVPB  Status:  Discontinued        1 g 200 mL/hr over 30 Minutes Intravenous Every 12 hours 06/25/21 0427 06/26/21 1114   06/25/21 0400  levofloxacin (LEVAQUIN) IVPB 750 mg  Status:  Discontinued        750 mg 100 mL/hr over 90 Minutes Intravenous Every 48 hours 06/25/21 0314 06/25/21 0427   06/25/21 0400  ceFEPIme (MAXIPIME) 2 g in sodium chloride 0.9 % 100 mL IVPB  Status:  Discontinued        2 g 200 mL/hr over 30 Minutes Intravenous Every 12 hours 06/25/21 0337 06/25/21 0427         Micro    Objective   Vitals:   07/18/21 0738 07/18/21 0842 07/18/21 1322 07/18/21 1657  BP: (!) 177/76 (!) 166/78 (!) 154/73 (!) 154/65  Pulse: 79 66  73 66  Resp: '20 17 20 18  '$ Temp: 98.3 F (36.8 C) 98 F (36.7 C) 98 F (36.7 C) 98.1 F (36.7 C)  TempSrc: Oral Oral Oral Oral  SpO2: 91% 99% 96% 98%  Weight:      Height:        Intake/Output Summary (Last 24 hours) at 07/18/2021 2006 Last data filed at 07/18/2021 1653 Gross per 24 hour  Intake 118 ml  Output 2600 ml  Net -2482 ml   Filed Weights   07/13/21 0607 07/14/21 0337 07/15/21 0327  Weight: 91.8 kg 77.6 kg 78 kg    Physical Exam:  General exam: sitting up in bed, awake, alert, no acute distress Respiratory system: CTAB, normal respiratory effort, on 2 L/min oxygen.  No wheezes or rhonchi. Cardiovascular system: normal S1/S2, RRR.   Central nervous system: A&O x3.  Normal speech, grossly nonfocal exam Psychiatry: normal mood, congruent affect, judgement and insight appear normal  Labs   Data Reviewed: I have personally reviewed following labs and imaging studies  CBC: Recent Labs  Lab 07/12/21 0203 07/13/21 0132 07/14/21 0055 07/15/21 0156 07/16/21 0019  WBC 4.7 3.7* 4.1 6.3 7.3  NEUTROABS 3.7 2.4 3.2 4.6 4.7  HGB 9.3* 8.9* 9.0* 9.2* 9.2*  HCT 28.9* 26.7* 26.8* 27.8* 27.5*  MCV 93.5 90.5 91.2 91.7 91.1  PLT 300 286 276 248 [id number]   Basic Metabolic Panel: Recent Labs  Lab 07/13/21 0132 07/14/21 0055  07/15/21 0156 07/16/21 0019 07/17/21 0517  NA 132* 130* 133* 134* 133*  K 4.2 5.1 4.6 4.6 4.2  CL 99 99 100 99 99  CO2 '26 23 25 29 27  '$ GLUCOSE 141* 178* 141* 86 84  BUN 27* 28* 29* 26* 27*  CREATININE 1.01* 1.00 0.93 0.90 0.89  CALCIUM 8.3* 8.2* 8.4* 8.5* 8.6*   GFR: Estimated Creatinine Clearance: 42.3 mL/min (by C-G formula based on SCr of 0.89 mg/dL). Liver Function Tests: Recent Labs  Lab 07/13/21 0132 07/14/21 0055 07/15/21 0156 07/16/21 0019 07/17/21 0517  AST 34 82* 50* 38 31  ALT 26 28 34 33 28  ALKPHOS 31* 31* 31* 35* 37*  BILITOT 0.6 1.4* 0.7 0.7 0.9  PROT 6.3* 6.0* 6.1* 6.0* 6.2*  ALBUMIN 2.6* 2.6* 2.6* 2.6* 2.7*   No results for input(s): LIPASE, AMYLASE in the last 168 hours. No results for input(s): AMMONIA in the last 168 hours. Coagulation Profile: No results for input(s): INR, PROTIME in the last 168 hours. Cardiac Enzymes: No results for input(s): CKTOTAL, CKMB, CKMBINDEX, TROPONINI in the last 168 hours. BNP (last 3 results) No results for input(s): PROBNP in the last 8760 hours. HbA1C: No results for input(s): HGBA1C in the last 72 hours. CBG: No results for input(s): GLUCAP in the last 168 hours. Lipid Profile: No results for input(s): CHOL, HDL, LDLCALC, TRIG, CHOLHDL, LDLDIRECT in the last 72 hours. Thyroid Function Tests: No results for input(s): TSH, T4TOTAL, FREET4, T3FREE, THYROIDAB in the last 72 hours. Anemia Panel: No results for input(s): VITAMINB12, FOLATE, FERRITIN, TIBC, IRON, RETICCTPCT in the last 72 hours. Sepsis Labs: No results for input(s): PROCALCITON, LATICACIDVEN in the last 168 hours.   Recent Results (from the past 240 hour(s))  SARS CORONAVIRUS 2 (TAT 6-24 HRS) Nasopharyngeal Nasopharyngeal Swab     Status: Abnormal   Collection Time: 07/10/21  1:36 PM   Specimen: Nasopharyngeal Swab  Result Value Ref Range Status   SARS Coronavirus 2 POSITIVE (A) NEGATIVE Final    Comment: (NOTE) SARS-CoV-2 target nucleic  acids  are DETECTED.  The SARS-CoV-2 RNA is generally detectable in upper and lower respiratory specimens during the acute phase of infection. Positive results are indicative of the presence of SARS-CoV-2 RNA. Clinical correlation with patient history and other diagnostic information is  necessary to determine patient infection status. Positive results do not rule out bacterial infection or co-infection with other viruses.  The expected result is Negative.  Fact Sheet for Patients: SugarRoll.be  Fact Sheet for Healthcare Providers: https://www.woods-mathews.com/  This test is not yet approved or cleared by the [place] FDA and  has been authorized for detection and/or diagnosis of SARS-CoV-2 by FDA under an Emergency Use Authorization (EUA). This EUA will remain  in effect (meaning this test can be used) for the duration of the COVID-19 declaration under Section 564(b)(1) of the Act, 21 U. S.C. section 360bbb-3(b)(1), unless the authorization is terminated or revoked sooner.   Performed at [facility], [place] [address] [place], [place] [address]       Imaging Studies   DG CHEST PORT 1 VIEW  Result Date: 07/17/2021 CLINICAL DATA:  Shortness of breath, hemoptysis. EXAM: PORTABLE CHEST 1 VIEW COMPARISON:  July 05, 2021. FINDINGS: Stable cardiomediastinal silhouette. No pneumothorax is noted. Stable peripheral right upper lobe opacity is noted. Stable bibasilar opacities are noted. No definite pneumothorax is noted. Probable small pleural effusions are noted. Bony thorax is unremarkable. IMPRESSION: Grossly stable bilateral lung opacities are noted with probable small bilateral pleural effusions. Aortic Atherosclerosis (ICD10-I70.0). Electronically Signed   By: Marijo Conception M.D.   On: 07/17/2021 09:04     Medications   Scheduled Meds:  vitamin C  500 mg Oral Daily   Chlorhexidine Gluconate Cloth  6 each Topical Daily    dexamethasone  6 mg Oral Q24H   enoxaparin (LOVENOX) injection  40 mg Subcutaneous QHS   feeding supplement  237 mL Oral TID BM   guaiFENesin  1,200 mg Oral BID   irbesartan  37.5 mg Oral Daily   mouth rinse  15 mL Mouth Rinse BID   metoprolol tartrate  12.5 mg Oral BID   multivitamin with minerals  1 tablet Oral Daily   pantoprazole  40 mg Oral BID   zinc sulfate  220 mg Oral Daily   Continuous Infusions:  sodium chloride 500 mL (07/11/21 1647)       LOS: 23 days    Time spent: 25 minutes with greater than 50% spent at bedside in coordination of care.    Ezekiel Slocumb, DO [facility]  07/18/2021, 8:06 PM      If 7PM-7AM, please contact night-coverage. How to contact the [facility] Attending or Consulting provider [place] or covering provider during after hours [place], for this patient?    Check the care team in [facility] and look for a) attending/consulting [facility] provider listed and b) the [facility] team listed Log into [email or web address] and use [place] universal password to access. If you do not have the password, please contact the hospital operator. Locate the [facility] provider you are looking for under [facility] and page to a number that you can be directly reached. If you still have difficulty reaching the provider, please page the [facility] (Director on Call) for the Hospitalists listed on amion for assistance.

## 2021-07-19 DIAGNOSIS — K668 Other specified disorders of peritoneum: Secondary | ICD-10-CM | POA: Diagnosis not present

## 2021-07-19 DIAGNOSIS — R042 Hemoptysis: Secondary | ICD-10-CM | POA: Diagnosis not present

## 2021-07-19 DIAGNOSIS — J939 Pneumothorax, unspecified: Secondary | ICD-10-CM

## 2021-07-19 DIAGNOSIS — J9601 Acute respiratory failure with hypoxia: Secondary | ICD-10-CM | POA: Diagnosis not present

## 2021-07-19 LAB — C-REACTIVE PROTEIN: CRP: 0.9 mg/dL (ref <1.0)

## 2021-07-19 LAB — D-DIMER, QUANTITATIVE: D-Dimer, Quant: 2.4 ug/mL-FEU — ABNORMAL HIGH (ref 0.00–0.50)

## 2021-07-19 NOTE — TOC Progression Note (Signed)
Transition of Care [facility]) - Progression Note    Patient Details  Name: Tanazia Fazekas MRN: [id number] Date of Birth: 1930-03-24  [facility] CM/SW [place], Delton Phone Number: 07/19/2021, 12:48 PM  Clinical Narrative:     Spoke with Admission Coordinator/Maddie- she will start authorization process today CSW contacted [employer]- transportation is scheduled for 07/24/21 '@12'$         ( this is the earliest time they have available). Hopefully we will have [employer] auth by this time. CSW called & updated granddaughter.  CSW will continue to follow and assist with discharge planning.  Thurmond Butts, MSW, LCSW Clinical Social Worker    Expected Discharge Plan: Home/Self Care Barriers to Discharge: Continued Medical Work up  Expected Discharge Plan and Services Expected Discharge Plan: Home/Self Care In-house Referral: Clinical Social Work     Living arrangements for the past 2 months: Single Family Home Expected Discharge Date: 07/07/21                                     Social Determinants of Health (SDOH) Interventions    Readmission Risk Interventions No flowsheet data found.

## 2021-07-19 NOTE — Progress Notes (Signed)
PROGRESS NOTE    Mary Strickland  [id number] DOB: 05-14-1930 DOA: 06/24/2021 PCP: Burnard Bunting, MD   Brief Narrative: Mary Strickland is a 87 y.o. female with a history of tobacco use, hypertension, mitral valve prolapse.  Patient presented secondary to hemoptysis with subsequent severe hypoxia requiring endotracheal intubation.  She was found to have pneumomediastinum and left pneumothorax and was treated with chest tube.  She also had hemoptysis which was treated conservatively.  Patient was initially transition to comfort measures with tubes removed which improved her clinical condition.  During admission she was also treated for pneumonia, COVID-19 infection with pneumonia, acute urinary retention.  Plan is for discharge to SNF.   Assessment & Plan:   Principal Problem:   Acute hypoxemic respiratory failure (HCC) Active Problems:   Hypertension   Hemoptysis   Abnormal CXR   Leukocytosis   Subcutaneous emphysema (HCC)   Pneumoperitoneum of unknown etiology   Pneumothorax on right   Acute respiratory failure with hypoxia Secondary to below problems. Required ICU admission. Intubated on 7/23 and successfully extubated on 7/25.  COVID-19 Pneumonia Multifocal pneumonia Pneumonia initially treated with meropenem IV > Unasyn IV and completed treatment. Prior to discharge, patient tested positive for COVID-19 and was started on Remdesivir and decadron; she has completed course. Resolved.  Left pneumothorax Pneumomediastinum Hemoptysis Aspirin discontinued. Patient required left chest tube from 7/25-7/28. No intervention for hemoptysis. Resolved.  Acute urinary retention Foley in place since 8/8 -Voiding trial today; if failed, will need foley replaced and Urology follow-up  Demand ischemia In setting of above problems. Cardiology consulted. Transthoracic Echocardiogram significant for normal EF and no wall motion abnormality. Cardiology signed off on 7/29  secondary to decision at that time for patient to transition to comfort measures (which is currently not the case). Will need outpatient follow-up.  Malpositioned left IJ CT neck without focal vascular injury. Removed.  Primary hypertension Patient is on valsartan and amlodipine as an outpatient. -Continue irbesartan (substituted for valsartan)  Obesity Body mass index is 31.46 kg/m.   DVT prophylaxis: Lovenox Code Status:   Code Status: DNR Family Communication: None at bedside Disposition Plan: Medically stable for discharge in 24 hours.   Consultants:  PCCM  Procedures:  INTUBATION/EXTUBATION CHEST TUBE PLACEMENT/REMOVAL TRANSTHORACIC ECHOCARDIOGRAM (06/26/2021) IMPRESSIONS     1. Left ventricular ejection fraction, by estimation, is 60 to 65%. The  left ventricle has normal function. Left ventricular endocardial border  not optimally defined to evaluate regional wall motion. Indeterminate  diastolic filling due to E-A fusion.   2. Right ventricular systolic function is normal. The right ventricular  size is normal. Tricuspid regurgitation signal is inadequate for assessing  PA pressure.   3. The mitral valve was not well visualized. No evidence of mitral valve  regurgitation. No evidence of mitral stenosis. Moderate mitral annular  calcification.   4. The aortic valve is tricuspid. Aortic valve regurgitation is not  visualized. Mild aortic valve sclerosis is present, with no evidence of  aortic valve stenosis.   5. The inferior vena cava is normal in size with greater than 50%  respiratory variability, suggesting right atrial pressure of 3 mmHg.   6. Technically difficult stutdy with poor acoustic windows.   Antimicrobials: Unasyn IV Meropenem IV  Remdesivir IV   Subjective: No dyspnea or chest pain.  Objective: Vitals:   07/18/21 1657 07/18/21 2245 07/19/21 0400 07/19/21 0800  BP: (!) 154/65 (!) 187/77 (!) 152/61 (!) 133/53  Pulse: 66 77 61 67  Resp:  '18 18 14 17  '$ Temp: 98.1 F (36.7 C) 97.6 F (36.4 C) 98 F (36.7 C) 98.1 F (36.7 C)  TempSrc: Oral Oral Oral Oral  SpO2: 98% 100% 100% 92%  Weight:      Height:        Intake/Output Summary (Last 24 hours) at 07/19/2021 1008 Last data filed at 07/18/2021 1653 Gross per 24 hour  Intake --  Output 1450 ml  Net -1450 ml   Filed Weights   07/13/21 0607 07/14/21 0337 07/15/21 0327  Weight: 91.8 kg 77.6 kg 78 kg    Examination:  General exam: Appears calm and comfortable Respiratory system: Clear to auscultation. Respiratory effort normal. Cardiovascular system: S1 & S2 heard, RRR. No murmurs, rubs, gallops or clicks. Gastrointestinal system: Abdomen is nondistended, soft and nontender. No organomegaly or masses felt. Normal bowel sounds heard. Central nervous system: Alert and oriented. No focal neurological deficits. Musculoskeletal: No edema. No calf tenderness Skin: No cyanosis. No rashes Psychiatry: Judgement and insight appear normal. Mood & affect appropriate.     Data Reviewed: I have personally reviewed following labs and imaging studies  CBC Lab Results  Component Value Date   WBC 7.3 07/16/2021   RBC 3.02 (L) 07/16/2021   HGB 9.2 (L) 07/16/2021   HCT 27.5 (L) 07/16/2021   MCV 91.1 07/16/2021   MCH 30.5 07/16/2021   PLT 267 07/16/2021   MCHC 33.5 07/16/2021   RDW 15.4 07/16/2021   LYMPHSABS 1.6 07/16/2021   MONOABS 0.9 07/16/2021   EOSABS 0.0 07/16/2021   BASOSABS 0.0 123456     Last metabolic panel Lab Results  Component Value Date   NA 133 (L) 07/17/2021   K 4.2 07/17/2021   CL 99 07/17/2021   CO2 27 07/17/2021   BUN 27 (H) 07/17/2021   CREATININE 0.89 07/17/2021   GLUCOSE 84 07/17/2021   GFRNONAA >60 07/17/2021   GFRAA 55 (L) 01/25/2021   CALCIUM 8.6 (L) 07/17/2021   PHOS 2.9 07/01/2021   PROT 6.2 (L) 07/17/2021   ALBUMIN 2.7 (L) 07/17/2021   BILITOT 0.9 07/17/2021   ALKPHOS 37 (L) 07/17/2021   AST 31 07/17/2021   ALT 28  07/17/2021   ANIONGAP 7 07/17/2021    CBG (last 3)  No results for input(s): GLUCAP in the last 72 hours.   GFR: Estimated Creatinine Clearance: 42.3 mL/min (by C-G formula based on SCr of 0.89 mg/dL).  Coagulation Profile: No results for input(s): INR, PROTIME in the last 168 hours.  Recent Results (from the past 240 hour(s))  SARS CORONAVIRUS 2 (TAT 6-24 HRS) Nasopharyngeal Nasopharyngeal Swab     Status: Abnormal   Collection Time: 07/10/21  1:36 PM   Specimen: Nasopharyngeal Swab  Result Value Ref Range Status   SARS Coronavirus 2 POSITIVE (A) NEGATIVE Final    Comment: (NOTE) SARS-CoV-2 target nucleic acids are DETECTED.  The SARS-CoV-2 RNA is generally detectable in upper and lower respiratory specimens during the acute phase of infection. Positive results are indicative of the presence of SARS-CoV-2 RNA. Clinical correlation with patient history and other diagnostic information is  necessary to determine patient infection status. Positive results do not rule out bacterial infection or co-infection with other viruses.  The expected result is Negative.  Fact Sheet for Patients: SugarRoll.be  Fact Sheet for Healthcare Providers: https://www.woods-mathews.com/  This test is not yet approved or cleared by the [place] FDA and  has been authorized for detection and/or diagnosis of SARS-CoV-2 by FDA under an  Emergency Use Authorization (EUA). This EUA will remain  in effect (meaning this test can be used) for the duration of the COVID-19 declaration under Section 564(b)(1) of the Act, 21 U. S.C. section 360bbb-3(b)(1), unless the authorization is terminated or revoked sooner.   Performed at [facility], [place] [address], [place], [place] [address]         Radiology Studies: No results found.      Scheduled Meds:  vitamin C  500 mg Oral Daily   Chlorhexidine Gluconate Cloth  6 each Topical Daily    enoxaparin (LOVENOX) injection  40 mg Subcutaneous QHS   feeding supplement  237 mL Oral TID BM   guaiFENesin  1,200 mg Oral BID   irbesartan  37.5 mg Oral Daily   mouth rinse  15 mL Mouth Rinse BID   metoprolol tartrate  12.5 mg Oral BID   multivitamin with minerals  1 tablet Oral Daily   pantoprazole  40 mg Oral BID   zinc sulfate  220 mg Oral Daily   Continuous Infusions:  sodium chloride 500 mL (07/11/21 1647)     LOS: 24 days     Cordelia Poche, MD [facility] 07/19/2021, 10:08 AM  If 7PM-7AM, please contact night-coverage [email or web address]

## 2021-07-19 NOTE — Progress Notes (Signed)
Occupational Therapy Treatment Patient Details Name: Naraly Goodloe MRN: [id number] DOB: 1930-02-05 Today's Date: 07/19/2021    History of present illness 87 y.o. female presents to [place] [facility] ED on 06/24/2021 with sudden onset hemoptysis. Pt experienced a bout of hypoxia in ED with AMS and was intubated on 7/23. Pt was transported to [facility] hospital on 7/24, found to have pneumomediastinum, pneumothorax. Chest tube inserted 7/24. Pt extubated on 7/25. Now covid+ as of 07/10/21. PMH includes history of smoking, hypertension, mitral valve prolapse.   OT comments  Pt. Was cooperative with therapy and was able to preform ADLs taks in sitting without lob. Pt. Was able to transfer to commode with assist. Pt. Required assist to clean back peri area. Pt. To continued to be seen for OT>   Follow Up Recommendations  SNF    Equipment Recommendations       Recommendations for Other Services      Precautions / Restrictions Precautions Precautions: Fall Precaution Comments: Airborne/Contact precs (Covid+ 8/8) Restrictions Weight Bearing Restrictions: No       Mobility Bed Mobility Overal bed mobility: Needs Assistance       Supine to sit: Supervision;HOB elevated     General bed mobility comments: cues for hand placement    Transfers Overall transfer level: Needs assistance Equipment used: Rolling walker (2 wheeled) Transfers: Sit to/from [facility] Sit to Stand: Min assist Stand pivot transfers: Min assist       General transfer comment: cues for proper hand placement    Balance     Sitting balance-[name] Scale: Good       Standing balance-[name] Scale: Fair                             ADL either performed or assessed with clinical judgement   ADL Overall ADL's : Needs assistance/impaired Eating/Feeding: Set up;Sitting   Grooming: Min guard;Standing;Wash/dry face;Oral care;Brushing hair Grooming Details (indicate cue type and reason): Pt  able to stand to wash face/hands thoroughly. did perform brushing teeth seated due to fatigue. OT assisting in putting pt hair in ponytail to get out of face         Upper Body Dressing : Set up;Sitting   Lower Body Dressing: Moderate assistance;Sit to/from stand   Toilet Transfer: Minimal assistance;Ambulation;RW   Toileting- Clothing Manipulation and Hygiene: Minimal assistance;Sit to/from stand       Functional mobility during ADLs: Minimal assistance;Rolling walker General ADL Comments: Pt. sat eob with good balance.     Vision   Vision Assessment?: No apparent visual deficits   Perception     Praxis      Cognition Arousal/Alertness: Awake/alert Behavior During Therapy: WFL for tasks assessed/performed Overall Cognitive Status: No family/caregiver present to determine baseline cognitive functioning                                 General Comments: WFL for basic tasks though noted with some increased confusion and difficulty problem solving, safety techniques, etc. Pt with improving participation this date compared with session on 8/11.        Exercises     Shoulder Instructions       General Comments      Pertinent Vitals/ Pain       Pain Assessment: No/denies pain  Home Living  Prior Functioning/Environment              Frequency  Min 2X/week        Progress Toward Goals  OT Goals(current goals can now be found in the care plan section)  Progress towards OT goals: Progressing toward goals  Acute Rehab OT Goals Patient Stated Goal: get stronger and go home. OT Goal Formulation: With patient Time For Goal Achievement: 07/31/21 Potential to Achieve Goals: Fair ADL Goals Pt Will Perform Grooming: with set-up;standing Pt Will Perform Lower Body Bathing: with set-up;sit to/from stand Pt Will Perform Lower Body Dressing: with set-up;sit to/from stand Pt Will Transfer to  Toilet: with supervision;ambulating Pt Will Perform Toileting - Clothing Manipulation and hygiene: with set-up;sit to/from stand;sitting/lateral leans Additional ADL Goal #1: Pt will tolerate OOB functional activity for at least 5 min to maximize ADL/mobility endurance  Plan Discharge plan remains appropriate    Co-evaluation                 AM-PAC OT "6 Clicks" Daily Activity     Outcome Measure   Help from another person eating meals?: None Help from another person taking care of personal grooming?: A Little Help from another person toileting, which includes using toliet, bedpan, or urinal?: A Little Help from another person bathing (including washing, rinsing, drying)?: A Lot Help from another person to put on and taking off regular upper body clothing?: A Little Help from another person to put on and taking off regular lower body clothing?: A Lot 6 Click Score: 17    End of Session Equipment Utilized During Treatment: Rolling walker;Oxygen  OT Visit Diagnosis: Unsteadiness on feet (R26.81);Muscle weakness (generalized) (M62.81);Pain   Activity Tolerance Patient tolerated treatment well   Patient Left in chair;with call bell/phone within reach;with chair alarm set   Nurse Communication  (ok therapy)        Time: 1010-1056 OT Time Calculation (min): 46 min  Charges: OT General Charges $OT Visit: 1 Visit OT Treatments $Self Care/Home Management : 38-52 mins  Reece Packer OT/L    Hetty Linhart 07/19/2021, 10:57 AM

## 2021-07-20 DIAGNOSIS — R042 Hemoptysis: Secondary | ICD-10-CM | POA: Diagnosis not present

## 2021-07-20 DIAGNOSIS — K668 Other specified disorders of peritoneum: Secondary | ICD-10-CM | POA: Diagnosis not present

## 2021-07-20 DIAGNOSIS — J939 Pneumothorax, unspecified: Secondary | ICD-10-CM | POA: Diagnosis not present

## 2021-07-20 DIAGNOSIS — J9601 Acute respiratory failure with hypoxia: Secondary | ICD-10-CM | POA: Diagnosis not present

## 2021-07-20 MED ORDER — DEXAMETHASONE 6 MG PO TABS
6.0000 mg | ORAL_TABLET | Freq: Once | ORAL | Status: AC
  Administered 2021-07-20: 6 mg via ORAL
  Filled 2021-07-20 (×2): qty 1

## 2021-07-20 NOTE — Progress Notes (Signed)
Physical Therapy Treatment Patient Details Name: Mary Strickland MRN: [id number] DOB: Aug 08, 1930 Today's Date: 07/20/2021    History of Present Illness 87 y.o. female presents to [place] [facility] ED on 06/24/2021 with sudden onset hemoptysis. Pt experienced a bout of hypoxia in ED with AMS and was intubated on 7/23. [facility] on 7/24, found to have pneumomediastinum, pneumothorax. Chest tube inserted 7/24. Pt extubated on 7/25. Now covid+ as of 07/10/21. PMH includes history of smoking, hypertension, mitral valve prolapse.    PT Comments    Patient received in bed, she is agreeable to PT session. Patient without significant complaints. She is mod independent with supine to sit. Performed sit to stand with supervision. Patient able to increase ambulation to approx 90 feet this session. Slow pace with RW and min guard. No lob. Patient limited by fatigue. She will continue to benefit from skilled PT while here to improve strength and functional independence. Patient may be able to go home at time of discharge if she has assistance at home.     Follow Up Recommendations  SNF;Supervision/Assistance - 24 hour     Equipment Recommendations  Rolling walker with 5" wheels;3in1 (PT)    Recommendations for Other Services       Precautions / Restrictions Precautions Precautions: Fall Precaution Comments: Airborne/Contact precs (Covid+ 8/8) Restrictions Weight Bearing Restrictions: No    Mobility  Bed Mobility Overal bed mobility: Modified Independent Bed Mobility: Supine to Sit     Supine to sit: HOB elevated;Modified independent (Device/Increase time)          Transfers Overall transfer level: Needs assistance Equipment used: Rolling walker (2 wheeled) Transfers: Sit to/from Stand Sit to Stand: Supervision            Ambulation/Gait Ambulation/Gait assistance: Min guard Gait Distance (Feet): 90 Feet Assistive device: Rolling walker (2 wheeled) Gait  Pattern/deviations: Step-through pattern;Decreased step length - right;Decreased step length - left;Decreased stride length Gait velocity: decreased   General Gait Details: Patient with improved ambulation tolerance and safety this session. Ambulated on room air with sats down to 88%. Quickly up to 91% once seated. [place] returned to patient while seated in recliner.  Continues to be limited by fatigue but improving.   Stairs             Wheelchair Mobility    Modified Rankin (Stroke Patients Only)       Balance Overall balance assessment: Needs assistance Sitting-balance support: Feet supported Sitting balance-[name] Scale: Good     Standing balance support: Bilateral upper extremity supported;During functional activity Standing balance-[name] Scale: Fair Standing balance comment: reliant on RW for gait but able to static stand with U UE support and no LOB                            Cognition Arousal/Alertness: Awake/alert Behavior During Therapy: WFL for tasks assessed/performed Overall Cognitive Status: Within Functional Limits for tasks assessed                                        Exercises      General Comments        Pertinent Vitals/Pain Pain Assessment: No/denies pain    Home Living                      Prior Function  PT Goals (current goals can now be found in the care plan section) Acute Rehab PT Goals Patient Stated Goal: get stronger and go home. PT Goal Formulation: With patient Time For Goal Achievement: 08/02/21 Potential to Achieve Goals: Good Progress towards PT goals: Progressing toward goals    Frequency    Min 2X/week      PT Plan Current plan remains appropriate    Co-evaluation              AM-PAC PT "6 Clicks" Mobility   Outcome Measure  Help needed turning from your back to your side while in a flat bed without using bedrails?: A Little Help needed moving from lying  on your back to sitting on the side of a flat bed without using bedrails?: A Little Help needed moving to and from a bed to a chair (including a wheelchair)?: A Little Help needed standing up from a chair using your arms (e.g., wheelchair or bedside chair)?: A Little Help needed to walk in hospital room?: A Little Help needed climbing 3-5 steps with a railing? : A Lot 6 Click Score: 17    End of Session Equipment Utilized During Treatment: Oxygen;Gait belt Activity Tolerance: Patient limited by fatigue Patient left: in chair;with call bell/phone within reach;with chair alarm set Nurse Communication: Mobility status PT Visit Diagnosis: Muscle weakness (generalized) (M62.81);Other abnormalities of gait and mobility (R26.89);Difficulty in walking, not elsewhere classified (R26.2)     Time: 1331-1400 PT Time Calculation (min) (ACUTE ONLY): 29 min  Charges:  $Gait Training: 23-37 mins                    [employer], PT, GCS 07/20/21,2:18 PM

## 2021-07-20 NOTE — Progress Notes (Signed)
PROGRESS NOTE    Mary Strickland  [id number] DOB: 1930/08/24 DOA: 06/24/2021 PCP: Burnard Bunting, MD   Brief Narrative: Mary Strickland is a 87 y.o. female with a history of tobacco use, hypertension, mitral valve prolapse.  Patient presented secondary to hemoptysis with subsequent severe hypoxia requiring endotracheal intubation.  She was found to have pneumomediastinum and left pneumothorax and was treated with chest tube.  She also had hemoptysis which was treated conservatively.  Patient was initially transition to comfort measures with tubes removed which improved her clinical condition.  During admission she was also treated for pneumonia, COVID-19 infection with pneumonia, acute urinary retention.  Plan is for discharge to SNF.   Assessment & Plan:   Principal Problem:   Acute hypoxemic respiratory failure (HCC) Active Problems:   Hypertension   Hemoptysis   Abnormal CXR   Leukocytosis   Subcutaneous emphysema (HCC)   Pneumoperitoneum of unknown etiology   Pneumothorax on left   Acute respiratory failure with hypoxia Secondary to below problems. Required ICU admission. Intubated on 7/23 and successfully extubated on 7/25.  COVID-19 Pneumonia Multifocal pneumonia Pneumonia initially treated with meropenem IV > Unasyn IV and completed treatment. Prior to discharge, patient tested positive for COVID-19 and was started on Remdesivir and decadron; she has completed course. Resolved.  Left pneumothorax Pneumomediastinum Hemoptysis Aspirin discontinued. Patient required left chest tube from 7/25-7/28. No intervention for hemoptysis. Resolved.  Acute urinary retention Foley in place since 8/8. Foley catheter discontinued on 8/17. Seems to be tolerating voiding trial -Strict in/out  Demand ischemia In setting of above problems. Cardiology consulted. Transthoracic Echocardiogram significant for normal EF and no wall motion abnormality. Cardiology signed off on  7/29 secondary to decision at that time for patient to transition to comfort measures (which is currently not the case). Will need outpatient follow-up.  Malpositioned left IJ CT neck without focal vascular injury. Removed.  Primary hypertension Patient is on valsartan and amlodipine as an outpatient. -Continue irbesartan (substituted for valsartan)  Obesity Body mass index is 31.46 kg/m.   DVT prophylaxis: Lovenox Code Status:   Code Status: DNR Family Communication: None at bedside Disposition Plan: Medically stable for discharge to SNF.   Consultants:  PCCM  Procedures:  INTUBATION/EXTUBATION CHEST TUBE PLACEMENT/REMOVAL TRANSTHORACIC ECHOCARDIOGRAM (06/26/2021) IMPRESSIONS     1. Left ventricular ejection fraction, by estimation, is 60 to 65%. The  left ventricle has normal function. Left ventricular endocardial border  not optimally defined to evaluate regional wall motion. Indeterminate  diastolic filling due to E-A fusion.   2. Right ventricular systolic function is normal. The right ventricular  size is normal. Tricuspid regurgitation signal is inadequate for assessing  PA pressure.   3. The mitral valve was not well visualized. No evidence of mitral valve  regurgitation. No evidence of mitral stenosis. Moderate mitral annular  calcification.   4. The aortic valve is tricuspid. Aortic valve regurgitation is not  visualized. Mild aortic valve sclerosis is present, with no evidence of  aortic valve stenosis.   5. The inferior vena cava is normal in size with greater than 50%  respiratory variability, suggesting right atrial pressure of 3 mmHg.   6. Technically difficult stutdy with poor acoustic windows.   Antimicrobials: Unasyn IV Meropenem IV  Remdesivir IV   Subjective: No dyspnea. Cough.  Objective: Vitals:   07/20/21 0539 07/20/21 0813 07/20/21 1045 07/20/21 1139  BP: (!) 166/73 (!) 175/70 108/64 (!) 127/58  Pulse: 68 67 80 69  Resp: 20 19  20 18   Temp: 97.9 F (36.6 C) 98 F (36.7 C) 98.4 F (36.9 C) 98.2 F (36.8 C)  TempSrc: Oral Oral Oral Oral  SpO2: 98% 90% 97% 93%  Weight:      Height:        Intake/Output Summary (Last 24 hours) at 07/20/2021 1358 Last data filed at 07/20/2021 1140 Gross per 24 hour  Intake 717 ml  Output 325 ml  Net 392 ml    Filed Weights   07/13/21 0607 07/14/21 0337 07/15/21 0327  Weight: 91.8 kg 77.6 kg 78 kg    Examination:  General exam: Appears calm and comfortable Respiratory system: disposable stethoscope used, diminished sounds but otherwise seem to be clear. Respiratory effort normal. Cardiovascular system: S1 & S2 heard but diminished, RRR. No murmurs, rubs, gallops or clicks. Gastrointestinal system: Abdomen is distended, soft and nontender. No organomegaly or masses felt. Normal bowel sounds heard. Central nervous system: Alert and oriented. No focal neurological deficits. Musculoskeletal: No edema. No calf tenderness Skin: No cyanosis. No rashes Psychiatry: Judgement and insight appear normal. Mood & affect appropriate.     Data Reviewed: I have personally reviewed following labs and imaging studies  CBC Lab Results  Component Value Date   WBC 7.3 07/16/2021   RBC 3.02 (L) 07/16/2021   HGB 9.2 (L) 07/16/2021   HCT 27.5 (L) 07/16/2021   MCV 91.1 07/16/2021   MCH 30.5 07/16/2021   PLT 267 07/16/2021   MCHC 33.5 07/16/2021   RDW 15.4 07/16/2021   LYMPHSABS 1.6 07/16/2021   MONOABS 0.9 07/16/2021   EOSABS 0.0 07/16/2021   BASOSABS 0.0 123456     Last metabolic panel Lab Results  Component Value Date   NA 133 (L) 07/17/2021   K 4.2 07/17/2021   CL 99 07/17/2021   CO2 27 07/17/2021   BUN 27 (H) 07/17/2021   CREATININE 0.89 07/17/2021   GLUCOSE 84 07/17/2021   GFRNONAA >60 07/17/2021   GFRAA 55 (L) 01/25/2021   CALCIUM 8.6 (L) 07/17/2021   PHOS 2.9 07/01/2021   PROT 6.2 (L) 07/17/2021   ALBUMIN 2.7 (L) 07/17/2021   BILITOT 0.9 07/17/2021   ALKPHOS  37 (L) 07/17/2021   AST 31 07/17/2021   ALT 28 07/17/2021   ANIONGAP 7 07/17/2021    CBG (last 3)  No results for input(s): GLUCAP in the last 72 hours.   GFR: Estimated Creatinine Clearance: 42.3 mL/min (by C-G formula based on SCr of 0.89 mg/dL).  Coagulation Profile: No results for input(s): INR, PROTIME in the last 168 hours.  No results found for this or any previous visit (from the past 240 hour(s)).       Radiology Studies: No results found.      Scheduled Meds:  vitamin C  500 mg Oral Daily   Chlorhexidine Gluconate Cloth  6 each Topical Daily   dexamethasone  6 mg Oral Once   enoxaparin (LOVENOX) injection  40 mg Subcutaneous QHS   feeding supplement  237 mL Oral TID BM   guaiFENesin  1,200 mg Oral BID   irbesartan  37.5 mg Oral Daily   mouth rinse  15 mL Mouth Rinse BID   metoprolol tartrate  12.5 mg Oral BID   multivitamin with minerals  1 tablet Oral Daily   pantoprazole  40 mg Oral BID   zinc sulfate  220 mg Oral Daily   Continuous Infusions:  sodium chloride 500 mL (07/11/21 1647)     LOS: 25 days  Cordelia Poche, MD [facility] 07/20/2021, 1:58 PM  If 7PM-7AM, please contact night-coverage [email or web address]

## 2021-07-20 NOTE — Progress Notes (Signed)
Nutrition Follow-up  DOCUMENTATION CODES:   Not applicable  INTERVENTION:   - Continue Ensure Enlive po TID, each supplement provides 350 kcal and 20 grams of protein  - Continue MVI with minerals daily  NUTRITION DIAGNOSIS:   Inadequate oral intake related to inability to eat as evidenced by NPO status.  Diet advanced   GOAL:   Patient will meet greater than or equal to 90% of their needs  Progressing  MONITOR:   PO intake, Supplement acceptance, Labs, Weight trends, I & O's  REASON FOR ASSESSMENT:   Ventilator    ASSESSMENT:   87 y.o. female who is a former smoker and has medical history of HTN, dyslipidemia, GERD, Morton's neuroma, idiopathic scoliosis, mitral valve prolapse, anxiety, psychosis, and diverticulitis. She presented to the ED via EMS d/t acute onset of hemoptysis. In the ED she was initially hemodynamically stable but then experienced sudden episode of O2 desaturation into the 80s and became altered with confusion. She was subsequently intubated in the ED.  7/25 - extubated 7/26 - chest tube placed for L pneumothorax, clear liquids, transferred to [facility] 7/27 - diet advanced to regular consistency  8/08 - COVID+   Appetite stable. Last three meals completions charted as 50%, 100%, and 75%. Taking Ensure 2-3 times daily.   Stable for d/c tomorrow. Transportation scheduled for 8/22 (earliest time available).   Admit weight: 73.5 kg Current weight: 78 kg   Medications: 500 mg vitamin C, decadron, zinc sulfate 220 mg  Labs: Na 133 (L)   Diet Order:   Diet Order             Diet regular Room service appropriate? Yes with Assist; Fluid consistency: Thin  Diet effective now                   EDUCATION NEEDS:   Education needs have been addressed  Skin:  Skin Assessment: Reviewed RN Assessment  Last BM:  8/17  Height:   Ht Readings from Last 1 Encounters:  06/25/21 '5\' 2"'$  (1.575 m)    Weight:   Wt Readings from Last 1 Encounters:   07/15/21 78 kg    Ideal Body Weight:  50 kg  BMI:  Body mass index is 31.46 kg/m.  Estimated Nutritional Needs:   Kcal:  1400-1600  Protein:  70-85 grams  Fluid:  >/= 1.5 L/day   Mariana Single MS, RD, LDN, CNSC Clinical Nutrition Pager listed in [place]

## 2021-07-21 DIAGNOSIS — R042 Hemoptysis: Secondary | ICD-10-CM | POA: Diagnosis not present

## 2021-07-21 DIAGNOSIS — J939 Pneumothorax, unspecified: Secondary | ICD-10-CM | POA: Diagnosis not present

## 2021-07-21 DIAGNOSIS — J9601 Acute respiratory failure with hypoxia: Secondary | ICD-10-CM | POA: Diagnosis not present

## 2021-07-21 DIAGNOSIS — K668 Other specified disorders of peritoneum: Secondary | ICD-10-CM | POA: Diagnosis not present

## 2021-07-21 NOTE — TOC Progression Note (Signed)
Transition of Care [facility]) - Progression Note    Patient Details  Name: Mary Strickland MRN: [id number] Date of Birth: 08/05/1930  Transition of Care [facility]) CM/SW [place], [place] Phone Number: 07/21/2021, 3:44 PM  Clinical Narrative:     CSW spoke with patient via phone- informed patient [employer] denied short term rehab at [facility]. CSW explained will contact her granddaughter and provide update. Patient requested CSW give her a call back once spoken with her granddaughter.  CSW spoke with grandddaugter, Hal Hope- informed peer to peer was completed by MD, however, [employer] still denied SNF stay. Granddaughter requested CSW not cancel her transportation to [facility] on Monday @ 12 at this time. She will contact SNF to determine if she can private pay for rehab.   CSW will follow up with SNF corporate office to verify if family can self pay. CSW will continue to follow and assist with discharge planning.  Thurmond Butts, MSW, LCSW Clinical Social Worker    Expected Discharge Plan: Home/Self Care Barriers to Discharge: Barriers Resolved  Expected Discharge Plan and Services Expected Discharge Plan: Home/Self Care In-house Referral: Clinical Social Work     Living arrangements for the past 2 months: Single Family Home Expected Discharge Date: 07/07/21                                     Social Determinants of Health (SDOH) Interventions    Readmission Risk Interventions No flowsheet data found.

## 2021-07-21 NOTE — Progress Notes (Signed)
PROGRESS NOTE    Mary Strickland  [id number] DOB: November 29, 1930 DOA: 06/24/2021 PCP: Burnard Bunting, MD   Brief Narrative: Mary Strickland is a 87 y.o. female with a history of tobacco use, hypertension, mitral valve prolapse.  Patient presented secondary to hemoptysis with subsequent severe hypoxia requiring endotracheal intubation.  She was found to have pneumomediastinum and left pneumothorax and was treated with chest tube.  She also had hemoptysis which was treated conservatively.  Patient was initially transition to comfort measures with tubes removed which improved her clinical condition.  During admission she was also treated for pneumonia, COVID-19 infection with pneumonia, acute urinary retention.  Plan is for discharge to SNF.   Assessment & Plan:   Principal Problem:   Acute hypoxemic respiratory failure (HCC) Active Problems:   Hypertension   Hemoptysis   Abnormal CXR   Leukocytosis   Subcutaneous emphysema (HCC)   Pneumoperitoneum of unknown etiology   Pneumothorax on left   Acute respiratory failure with hypoxia Secondary to below problems. Required ICU admission. Intubated on 7/23 and successfully extubated on 7/25.  COVID-19 Pneumonia Multifocal pneumonia Pneumonia initially treated with meropenem IV > Unasyn IV and completed treatment. Prior to discharge, patient tested positive for COVID-19 and was started on Remdesivir and decadron; she has completed course. Resolved.  Left pneumothorax Pneumomediastinum Hemoptysis Aspirin discontinued. Patient required left chest tube from 7/25-7/28. No intervention for hemoptysis. Resolved.  Acute urinary retention Foley in place since 8/8. Foley catheter discontinued on 8/17. Seems to be tolerating voiding trial -Strict in/out  Demand ischemia In setting of above problems. Cardiology consulted. Transthoracic Echocardiogram significant for normal EF and no wall motion abnormality. Cardiology signed off on  7/29 secondary to decision at that time for patient to transition to comfort measures (which is currently not the case). Will need outpatient follow-up.  Malpositioned left IJ CT neck without focal vascular injury. Removed.  Primary hypertension Patient is on valsartan and amlodipine as an outpatient. -Continue irbesartan (substituted for valsartan)  Obesity Body mass index is 33.95 kg/m.   DVT prophylaxis: Lovenox Code Status:   Code Status: DNR Family Communication: None at bedside Disposition Plan: Medically stable for discharge to SNF. Insurance is denying SNF at this time; peer-to-peer pending   Consultants:  PCCM  Procedures:  INTUBATION/EXTUBATION CHEST TUBE PLACEMENT/REMOVAL TRANSTHORACIC ECHOCARDIOGRAM (06/26/2021) IMPRESSIONS     1. Left ventricular ejection fraction, by estimation, is 60 to 65%. The  left ventricle has normal function. Left ventricular endocardial border  not optimally defined to evaluate regional wall motion. Indeterminate  diastolic filling due to E-A fusion.   2. Right ventricular systolic function is normal. The right ventricular  size is normal. Tricuspid regurgitation signal is inadequate for assessing  PA pressure.   3. The mitral valve was not well visualized. No evidence of mitral valve  regurgitation. No evidence of mitral stenosis. Moderate mitral annular  calcification.   4. The aortic valve is tricuspid. Aortic valve regurgitation is not  visualized. Mild aortic valve sclerosis is present, with no evidence of  aortic valve stenosis.   5. The inferior vena cava is normal in size with greater than 50%  respiratory variability, suggesting right atrial pressure of 3 mmHg.   6. Technically difficult stutdy with poor acoustic windows.   Antimicrobials: Unasyn IV Meropenem IV  Remdesivir IV   Subjective: No dyspnea. Cough.  Objective: Vitals:   07/20/21 2359 07/21/21 0455 07/21/21 0821 07/21/21 1110  BP: (!) 143/59 (!) 150/65  (!) 173/80 132/65  Pulse: 76 84 82 73  Resp: '14 13 20 20  '$ Temp: 98.2 F (36.8 C) 98.1 F (36.7 C) 97.9 F (36.6 C) 97.7 F (36.5 C)  TempSrc: Oral Oral Oral Oral  SpO2: 98% 92% 93% 97%  Weight:  84.2 kg    Height:       No intake or output data in the 24 hours ending 07/21/21 1253  Filed Weights   07/14/21 0337 07/15/21 0327 07/21/21 0455  Weight: 77.6 kg 78 kg 84.2 kg    Examination:  General exam: Appears calm and comfortable Respiratory system: Rhonchi. Respiratory effort normal. Cardiovascular system: S1 & S2 heard, RRR. No murmurs, rubs, gallops or clicks. Gastrointestinal system: Abdomen is mildly distended, soft and nontender. No organomegaly or masses felt. Normal bowel sounds heard. Central nervous system: Alert and oriented. No focal neurological deficits. Musculoskeletal: No edema. No calf tenderness Skin: No cyanosis. No rashes Psychiatry: Judgement and insight appear normal. Mood & affect appropriate.     Data Reviewed: I have personally reviewed following labs and imaging studies  CBC Lab Results  Component Value Date   WBC 7.3 07/16/2021   RBC 3.02 (L) 07/16/2021   HGB 9.2 (L) 07/16/2021   HCT 27.5 (L) 07/16/2021   MCV 91.1 07/16/2021   MCH 30.5 07/16/2021   PLT 267 07/16/2021   MCHC 33.5 07/16/2021   RDW 15.4 07/16/2021   LYMPHSABS 1.6 07/16/2021   MONOABS 0.9 07/16/2021   EOSABS 0.0 07/16/2021   BASOSABS 0.0 123456     Last metabolic panel Lab Results  Component Value Date   NA 133 (L) 07/17/2021   K 4.2 07/17/2021   CL 99 07/17/2021   CO2 27 07/17/2021   BUN 27 (H) 07/17/2021   CREATININE 0.89 07/17/2021   GLUCOSE 84 07/17/2021   GFRNONAA >60 07/17/2021   GFRAA 55 (L) 01/25/2021   CALCIUM 8.6 (L) 07/17/2021   PHOS 2.9 07/01/2021   PROT 6.2 (L) 07/17/2021   ALBUMIN 2.7 (L) 07/17/2021   BILITOT 0.9 07/17/2021   ALKPHOS 37 (L) 07/17/2021   AST 31 07/17/2021   ALT 28 07/17/2021   ANIONGAP 7 07/17/2021    CBG (last 3)  No  results for input(s): GLUCAP in the last 72 hours.   GFR: Estimated Creatinine Clearance: 43.9 mL/min (by C-G formula based on SCr of 0.89 mg/dL).  Coagulation Profile: No results for input(s): INR, PROTIME in the last 168 hours.  No results found for this or any previous visit (from the past 240 hour(s)).       Radiology Studies: No results found.      Scheduled Meds:  vitamin C  500 mg Oral Daily   Chlorhexidine Gluconate Cloth  6 each Topical Daily   enoxaparin (LOVENOX) injection  40 mg Subcutaneous QHS   feeding supplement  237 mL Oral TID BM   guaiFENesin  1,200 mg Oral BID   irbesartan  37.5 mg Oral Daily   mouth rinse  15 mL Mouth Rinse BID   metoprolol tartrate  12.5 mg Oral BID   multivitamin with minerals  1 tablet Oral Daily   pantoprazole  40 mg Oral BID   zinc sulfate  220 mg Oral Daily   Continuous Infusions:  sodium chloride 500 mL (07/11/21 1647)     LOS: 26 days     Cordelia Poche, MD [facility] 07/21/2021, 12:53 PM  If 7PM-7AM, please contact night-coverage [email or web address]

## 2021-07-21 NOTE — Plan of Care (Signed)
  Problem: Health Behavior/Discharge Planning: Goal: Ability to manage health-related needs will improve Outcome: Not Progressing   

## 2021-07-21 NOTE — TOC Transition Note (Addendum)
Transition of Care [facility]) - CM/SW    Patient Details  Name: Mary Strickland MRN: [id number] Date of Birth: Jun 20, 1930  [facility] CM/SW Contact:  Vinie Sill, LCSW Phone Number: 07/21/2021, 10:28 AM   Clinical Narrative:     Informed MD- insurance [employer] has denied SNF approval- requested Peer to Peer today by noon @ [phone], option 3.  CSW will continue to follow and assist with discharge planning.   Thurmond Butts, MSW, LCSW Clinical Social Worker    Final next level of care: [place]- insurance denied- requesting peer to peer    Patient Goals and CMS Choice Patient states their goals for this hospitalization and ongoing recovery are:: intubated CMS Medicare.gov Compare Post Acute Care list provided to:: Patient    Discharge Placement                Discharge Plan and Services In-house Referral: Clinical Social Work                                   Social Determinants of Health (SDOH) Interventions     Readmission Risk Interventions No flowsheet data found.

## 2021-07-21 NOTE — TOC Progression Note (Signed)
Transition of Care [facility]) - Progression Note    Patient Details  Name: Mary Strickland MRN: [id number] Date of Birth: 01/05/1930  Transition of Care [facility]) CM/SW [place], [place] Phone Number: 07/21/2021, 5:16 PM  Clinical Narrative:     Anticipated discharge on Monday 08/22 to [facility] [id number] # [phone]- patient will be transported by [facility] [phone]- pick up time 12:00 noon. CSW will fax discharge summary to [phone] once completed.    Spoke with patient's graddaughter, Hal Hope- family is going to private pay for rehab at [facility]. CSW confirmed with [facility] - family can private pay and remains agreeable to admit on Monday.  CSW will continue to follow and assist with discharge planning.  Thurmond Butts, MSW, LCSW Clinical Social Worker    Expected Discharge Plan: Home/Self Care Barriers to Discharge: Barriers Resolved  Expected Discharge Plan and Services Expected Discharge Plan: Home/Self Care In-house Referral: Clinical Social Work     Living arrangements for the past 2 months: Single Family Home Expected Discharge Date: 07/07/21                                     Social Determinants of Health (SDOH) Interventions    Readmission Risk Interventions No flowsheet data found.

## 2021-07-22 DIAGNOSIS — J939 Pneumothorax, unspecified: Secondary | ICD-10-CM | POA: Diagnosis not present

## 2021-07-22 DIAGNOSIS — J9601 Acute respiratory failure with hypoxia: Secondary | ICD-10-CM | POA: Diagnosis not present

## 2021-07-22 DIAGNOSIS — K668 Other specified disorders of peritoneum: Secondary | ICD-10-CM | POA: Diagnosis not present

## 2021-07-22 DIAGNOSIS — R042 Hemoptysis: Secondary | ICD-10-CM | POA: Diagnosis not present

## 2021-07-22 MED ORDER — BENZONATATE 100 MG PO CAPS
100.0000 mg | ORAL_CAPSULE | Freq: Three times a day (TID) | ORAL | Status: DC
  Administered 2021-07-22 – 2021-08-03 (×37): 100 mg via ORAL
  Filled 2021-07-22 (×37): qty 1

## 2021-07-22 NOTE — Progress Notes (Signed)
PROGRESS NOTE    Mary Strickland  [id number] DOB: 27-Dec-1929 DOA: 06/24/2021 PCP: Burnard Bunting, MD   Brief Narrative: Mary Strickland is a 87 y.o. female with a history of tobacco use, hypertension, mitral valve prolapse.  Patient presented secondary to hemoptysis with subsequent severe hypoxia requiring endotracheal intubation.  She was found to have pneumomediastinum and left pneumothorax and was treated with chest tube.  She also had hemoptysis which was treated conservatively.  Patient was initially transition to comfort measures with tubes removed which improved her clinical condition.  During admission she was also treated for pneumonia, COVID-19 infection with pneumonia, acute urinary retention.  Plan is for discharge to SNF.   Assessment & Plan:   Principal Problem:   Acute hypoxemic respiratory failure (HCC) Active Problems:   Hypertension   Hemoptysis   Abnormal CXR   Leukocytosis   Subcutaneous emphysema (HCC)   Pneumoperitoneum of unknown etiology   Pneumothorax on left   Acute respiratory failure with hypoxia Secondary to below problems. Required ICU admission. Intubated on 7/23 and successfully extubated on 7/25.  COVID-19 Pneumonia Multifocal pneumonia Pneumonia initially treated with meropenem IV > Unasyn IV and completed treatment. Prior to discharge, patient tested positive for COVID-19 and was started on Remdesivir and decadron; she has completed course. Resolved. -Tessalon perles for cough  Left pneumothorax Pneumomediastinum Hemoptysis Aspirin discontinued. Patient required left chest tube from 7/25-7/28. No intervention for hemoptysis. Resolved.  Acute urinary retention Foley in place since 8/8. Foley catheter discontinued on 8/17. Seems to be tolerating voiding trial. Resolved. -Strict in/out  Demand ischemia In setting of above problems. Cardiology consulted. Transthoracic Echocardiogram significant for normal EF and no wall motion  abnormality. Cardiology signed off on 7/29 secondary to decision at that time for patient to transition to comfort measures (which is currently not the case). Will need outpatient follow-up.  Malpositioned left IJ CT neck without focal vascular injury. Removed.  Primary hypertension Patient is on valsartan and amlodipine as an outpatient. -Continue irbesartan (substituted for valsartan)  Obesity Body mass index is 33.91 kg/m.   DVT prophylaxis: Lovenox Code Status:   Code Status: DNR Family Communication: None at bedside Disposition Plan: Medically stable for discharge to SNF. Insurance denied SNF. Family will pay out-of-pocket. Transportation arranged for 8/22.   Consultants:  PCCM  Procedures:  INTUBATION/EXTUBATION CHEST TUBE PLACEMENT/REMOVAL TRANSTHORACIC ECHOCARDIOGRAM (06/26/2021) IMPRESSIONS     1. Left ventricular ejection fraction, by estimation, is 60 to 65%. The  left ventricle has normal function. Left ventricular endocardial border  not optimally defined to evaluate regional wall motion. Indeterminate  diastolic filling due to E-A fusion.   2. Right ventricular systolic function is normal. The right ventricular  size is normal. Tricuspid regurgitation signal is inadequate for assessing  PA pressure.   3. The mitral valve was not well visualized. No evidence of mitral valve  regurgitation. No evidence of mitral stenosis. Moderate mitral annular  calcification.   4. The aortic valve is tricuspid. Aortic valve regurgitation is not  visualized. Mild aortic valve sclerosis is present, with no evidence of  aortic valve stenosis.   5. The inferior vena cava is normal in size with greater than 50%  respiratory variability, suggesting right atrial pressure of 3 mmHg.   6. Technically difficult stutdy with poor acoustic windows.   Antimicrobials: Unasyn IV Meropenem IV  Remdesivir IV   Subjective: No chest pain or dyspnea. Cough.  Objective: Vitals:    07/22/21 0510 07/22/21 [id number] 07/22/21 0618 07/22/21  0800  BP: (!) 137/58   (!) 125/49  Pulse: 91 87  80  Resp: (!) '30 20  16  '$ Temp: 98.5 F (36.9 C)   98 F (36.7 C)  TempSrc: Oral   Oral  SpO2: 93% 96%  93%  Weight:   84.1 kg   Height:        Intake/Output Summary (Last 24 hours) at 07/22/2021 1007 Last data filed at 07/21/2021 2000 Gross per 24 hour  Intake 480 ml  Output --  Net 480 ml    Filed Weights   07/15/21 0327 07/21/21 0455 07/22/21 0618  Weight: 78 kg 84.2 kg 84.1 kg    Examination:  General exam: Appears calm and comfortable Respiratory system: Clear to auscultation. Respiratory effort normal. Cardiovascular system: S1 & S2 heard, RRR. No murmurs, rubs, gallops or clicks. Gastrointestinal system: Abdomen is nondistended, soft and nontender. No organomegaly or masses felt. Normal bowel sounds heard. Central nervous system: Alert. No focal neurological deficits. Musculoskeletal: No edema. No calf tenderness Skin: No cyanosis. No rashes Psychiatry: Judgement and insight appear normal. Mood & affect appropriate.     Data Reviewed: I have personally reviewed following labs and imaging studies  CBC Lab Results  Component Value Date   WBC 7.3 07/16/2021   RBC 3.02 (L) 07/16/2021   HGB 9.2 (L) 07/16/2021   HCT 27.5 (L) 07/16/2021   MCV 91.1 07/16/2021   MCH 30.5 07/16/2021   PLT 267 07/16/2021   MCHC 33.5 07/16/2021   RDW 15.4 07/16/2021   LYMPHSABS 1.6 07/16/2021   MONOABS 0.9 07/16/2021   EOSABS 0.0 07/16/2021   BASOSABS 0.0 123456     Last metabolic panel Lab Results  Component Value Date   NA 133 (L) 07/17/2021   K 4.2 07/17/2021   CL 99 07/17/2021   CO2 27 07/17/2021   BUN 27 (H) 07/17/2021   CREATININE 0.89 07/17/2021   GLUCOSE 84 07/17/2021   GFRNONAA >60 07/17/2021   GFRAA 55 (L) 01/25/2021   CALCIUM 8.6 (L) 07/17/2021   PHOS 2.9 07/01/2021   PROT 6.2 (L) 07/17/2021   ALBUMIN 2.7 (L) 07/17/2021   BILITOT 0.9 07/17/2021    ALKPHOS 37 (L) 07/17/2021   AST 31 07/17/2021   ALT 28 07/17/2021   ANIONGAP 7 07/17/2021    CBG (last 3)  No results for input(s): GLUCAP in the last 72 hours.   GFR: Estimated Creatinine Clearance: 43.9 mL/min (by C-G formula based on SCr of 0.89 mg/dL).  Coagulation Profile: No results for input(s): INR, PROTIME in the last 168 hours.  No results found for this or any previous visit (from the past 240 hour(s)).       Radiology Studies: No results found.      Scheduled Meds:  vitamin C  500 mg Oral Daily   benzonatate  100 mg Oral TID   Chlorhexidine Gluconate Cloth  6 each Topical Daily   enoxaparin (LOVENOX) injection  40 mg Subcutaneous QHS   feeding supplement  237 mL Oral TID BM   guaiFENesin  1,200 mg Oral BID   irbesartan  37.5 mg Oral Daily   mouth rinse  15 mL Mouth Rinse BID   metoprolol tartrate  12.5 mg Oral BID   multivitamin with minerals  1 tablet Oral Daily   pantoprazole  40 mg Oral BID   zinc sulfate  220 mg Oral Daily   Continuous Infusions:  sodium chloride 500 mL (07/11/21 1647)     LOS: 27 days  Cordelia Poche, MD [facility] 07/22/2021, 10:07 AM  If 7PM-7AM, please contact night-coverage [email or web address]

## 2021-07-23 DIAGNOSIS — J9601 Acute respiratory failure with hypoxia: Secondary | ICD-10-CM | POA: Diagnosis not present

## 2021-07-23 NOTE — Progress Notes (Signed)
PROGRESS NOTE    Mary Strickland  [id number] DOB: [birth year] DOA: 06/24/2021 PCP: Burnard Bunting, MD   Brief Narrative: Mary Strickland is a 87 y.o. female with a history of tobacco use, hypertension, mitral valve prolapse.  Patient presented secondary to hemoptysis with subsequent severe hypoxia requiring endotracheal intubation.  She was found to have pneumomediastinum and left pneumothorax and was treated with chest tube.  She also had hemoptysis which was treated conservatively.  Patient was initially transition to comfort measures with tubes removed which improved her clinical condition.  During admission she was also treated for pneumonia, COVID-19 infection with pneumonia, acute urinary retention.  Plan is for discharge to SNF.   Assessment & Plan:   Principal Problem:   Acute hypoxemic respiratory failure (HCC) Active Problems:   Hypertension   Hemoptysis   Abnormal CXR   Leukocytosis   Subcutaneous emphysema (HCC)   Pneumoperitoneum of unknown etiology   Pneumothorax on left   Acute respiratory failure with hypoxia Secondary to below problems. Required ICU admission. Intubated on 7/23 and successfully extubated on 7/25.  COVID-19 Pneumonia Multifocal pneumonia Pneumonia initially treated with meropenem IV > Unasyn IV and completed treatment. Prior to discharge, patient tested positive for COVID-19 and was started on Remdesivir and decadron; she has completed course. Resolved. -Tessalon perles for cough  Left pneumothorax Pneumomediastinum Hemoptysis Aspirin discontinued. Patient required left chest tube from 7/25-7/28. No intervention for hemoptysis. Resolved.  Acute urinary retention Foley in place since 8/8. Foley catheter discontinued on 8/17. Seems to be tolerating voiding trial. Resolved. -Strict in/out  Demand ischemia In setting of above problems. Cardiology consulted. Transthoracic Echocardiogram significant for normal EF and no wall motion  abnormality. Cardiology signed off on 7/29 secondary to decision at that time for patient to transition to comfort measures (which is currently not the case). Will need outpatient follow-up.  Malpositioned left IJ CT neck without focal vascular injury. Removed.  Primary hypertension Patient is on valsartan and amlodipine as an outpatient. -Continue irbesartan (substituted for valsartan)  Obesity Body mass index is 30.36 kg/m.   DVT prophylaxis: Lovenox Code Status:   Code Status: DNR Family Communication: None at bedside Disposition Plan: Medically stable for discharge to SNF. Insurance denied SNF. Family will pay out-of-pocket. Transportation arranged for 8/22.   Consultants:  PCCM  Procedures:  INTUBATION/EXTUBATION CHEST TUBE PLACEMENT/REMOVAL TRANSTHORACIC ECHOCARDIOGRAM (06/26/2021) IMPRESSIONS     1. Left ventricular ejection fraction, by estimation, is 60 to 65%. The  left ventricle has normal function. Left ventricular endocardial border  not optimally defined to evaluate regional wall motion. Indeterminate  diastolic filling due to E-A fusion.   2. Right ventricular systolic function is normal. The right ventricular  size is normal. Tricuspid regurgitation signal is inadequate for assessing  PA pressure.   3. The mitral valve was not well visualized. No evidence of mitral valve  regurgitation. No evidence of mitral stenosis. Moderate mitral annular  calcification.   4. The aortic valve is tricuspid. Aortic valve regurgitation is not  visualized. Mild aortic valve sclerosis is present, with no evidence of  aortic valve stenosis.   5. The inferior vena cava is normal in size with greater than 50%  respiratory variability, suggesting right atrial pressure of 3 mmHg.   6. Technically difficult stutdy with poor acoustic windows.   Antimicrobials: Unasyn IV Meropenem IV  Remdesivir IV   Subjective: No issues noted overnight  Objective: Vitals:   07/23/21  0000 07/23/21 0008 07/23/21 0500 07/23/21 [id number]  BP: (!) 124/49  (!) 112/50 (!) 128/54  Pulse: (!) 110 (!) 119  100  Resp: (!) 25 (!) 21 (!) 21 20  Temp: 99.6 F (37.6 C)  99.9 F (37.7 C) (!) 100.5 F (38.1 C)  TempSrc: Oral  Oral Oral  SpO2: 95% 94% 98% 94%  Weight:   75.3 kg   Height:        Intake/Output Summary (Last 24 hours) at 07/23/2021 1000 Last data filed at 07/22/2021 2129 Gross per 24 hour  Intake 358 ml  Output --  Net 358 ml    Filed Weights   07/21/21 0455 07/22/21 0618 07/23/21 0500  Weight: 84.2 kg 84.1 kg 75.3 kg    Examination:  General: Well appearing, no distress    Data Reviewed: I have personally reviewed following labs and imaging studies  CBC Lab Results  Component Value Date   WBC 7.3 07/16/2021   RBC 3.02 (L) 07/16/2021   HGB 9.2 (L) 07/16/2021   HCT 27.5 (L) 07/16/2021   MCV 91.1 07/16/2021   MCH 30.5 07/16/2021   PLT 267 07/16/2021   MCHC 33.5 07/16/2021   RDW 15.4 07/16/2021   LYMPHSABS 1.6 07/16/2021   MONOABS 0.9 07/16/2021   EOSABS 0.0 07/16/2021   BASOSABS 0.0 123456     Last metabolic panel Lab Results  Component Value Date   NA 133 (L) 07/17/2021   K 4.2 07/17/2021   CL 99 07/17/2021   CO2 27 07/17/2021   BUN 27 (H) 07/17/2021   CREATININE 0.89 07/17/2021   GLUCOSE 84 07/17/2021   GFRNONAA >60 07/17/2021   GFRAA 55 (L) 01/25/2021   CALCIUM 8.6 (L) 07/17/2021   PHOS 2.9 07/01/2021   PROT 6.2 (L) 07/17/2021   ALBUMIN 2.7 (L) 07/17/2021   BILITOT 0.9 07/17/2021   ALKPHOS 37 (L) 07/17/2021   AST 31 07/17/2021   ALT 28 07/17/2021   ANIONGAP 7 07/17/2021    CBG (last 3)  No results for input(s): GLUCAP in the last 72 hours.   GFR: Estimated Creatinine Clearance: 41.5 mL/min (by C-G formula based on SCr of 0.89 mg/dL).  Coagulation Profile: No results for input(s): INR, PROTIME in the last 168 hours.  No results found for this or any previous visit (from the past 240 hour(s)).       Radiology  Studies: No results found.      Scheduled Meds:  vitamin C  500 mg Oral Daily   benzonatate  100 mg Oral TID   Chlorhexidine Gluconate Cloth  6 each Topical Daily   enoxaparin (LOVENOX) injection  40 mg Subcutaneous QHS   feeding supplement  237 mL Oral TID BM   guaiFENesin  1,200 mg Oral BID   irbesartan  37.5 mg Oral Daily   mouth rinse  15 mL Mouth Rinse BID   metoprolol tartrate  12.5 mg Oral BID   multivitamin with minerals  1 tablet Oral Daily   pantoprazole  40 mg Oral BID   zinc sulfate  220 mg Oral Daily   Continuous Infusions:  sodium chloride 500 mL (07/11/21 1647)     LOS: 28 days     Cordelia Poche, MD [facility] 07/23/2021, 10:00 AM  If 7PM-7AM, please contact night-coverage [email or web address]

## 2021-07-24 ENCOUNTER — Inpatient Hospital Stay (HOSPITAL_COMMUNITY): Payer: Medicare HMO

## 2021-07-24 DIAGNOSIS — K668 Other specified disorders of peritoneum: Secondary | ICD-10-CM | POA: Diagnosis not present

## 2021-07-24 DIAGNOSIS — J9601 Acute respiratory failure with hypoxia: Secondary | ICD-10-CM | POA: Diagnosis not present

## 2021-07-24 DIAGNOSIS — J939 Pneumothorax, unspecified: Secondary | ICD-10-CM | POA: Diagnosis not present

## 2021-07-24 DIAGNOSIS — R042 Hemoptysis: Secondary | ICD-10-CM | POA: Diagnosis not present

## 2021-07-24 LAB — COMPREHENSIVE METABOLIC PANEL
ALT: 18 U/L (ref 0–44)
AST: 22 U/L (ref 15–41)
Albumin: 2.6 g/dL — ABNORMAL LOW (ref 3.5–5.0)
Alkaline Phosphatase: 41 U/L (ref 38–126)
Anion gap: 12 (ref 5–15)
BUN: 43 mg/dL — ABNORMAL HIGH (ref 8–23)
CO2: 23 mmol/L (ref 22–32)
Calcium: 8.6 mg/dL — ABNORMAL LOW (ref 8.9–10.3)
Chloride: 96 mmol/L — ABNORMAL LOW (ref 98–111)
Creatinine, Ser: 1.25 mg/dL — ABNORMAL HIGH (ref 0.44–1.00)
GFR, Estimated: 41 mL/min — ABNORMAL LOW (ref >60)
Glucose, Bld: 96 mg/dL (ref 70–99)
Potassium: 4.1 mmol/L (ref 3.5–5.1)
Sodium: 131 mmol/L — ABNORMAL LOW (ref 135–145)
Total Bilirubin: 1.7 mg/dL — ABNORMAL HIGH (ref 0.3–1.2)
Total Protein: 6.8 g/dL (ref 6.5–8.1)

## 2021-07-24 LAB — CBC
HCT: 32.8 % — ABNORMAL LOW (ref 36.0–46.0)
Hemoglobin: 10.5 g/dL — ABNORMAL LOW (ref 12.0–15.0)
MCH: 29.8 pg (ref 26.0–34.0)
MCHC: 32 g/dL (ref 30.0–36.0)
MCV: 93.2 fL (ref 80.0–100.0)
Platelets: 206 10*3/uL (ref 150–400)
RBC: 3.52 MIL/uL — ABNORMAL LOW (ref 3.87–5.11)
RDW: 16.5 % — ABNORMAL HIGH (ref 11.5–15.5)
WBC: 11.9 10*3/uL — ABNORMAL HIGH (ref 4.0–10.5)
nRBC: 0 % (ref 0.0–0.2)

## 2021-07-24 LAB — PROCALCITONIN: Procalcitonin: 0.58 ng/mL

## 2021-07-24 MED ORDER — ACETAMINOPHEN 325 MG PO TABS
650.0000 mg | ORAL_TABLET | Freq: Four times a day (QID) | ORAL | Status: DC | PRN
  Administered 2021-07-24 – 2021-07-30 (×5): 650 mg via ORAL
  Filled 2021-07-24 (×5): qty 2

## 2021-07-24 MED ORDER — AZITHROMYCIN 500 MG PO TABS
500.0000 mg | ORAL_TABLET | Freq: Every day | ORAL | Status: DC
Start: 2021-07-24 — End: 2021-07-25
  Administered 2021-07-24: 500 mg via ORAL
  Filled 2021-07-24 (×2): qty 1

## 2021-07-24 MED ORDER — CEFTRIAXONE SODIUM 2 G IJ SOLR
2.0000 g | INTRAMUSCULAR | Status: DC
  Administered 2021-07-24: 2 g via INTRAVENOUS
  Filled 2021-07-24 (×2): qty 20

## 2021-07-24 MED ORDER — SODIUM CHLORIDE 0.45 % IV SOLN: INTRAVENOUS | Status: DC

## 2021-07-24 NOTE — TOC Progression Note (Addendum)
Transition of Care [facility]) - Progression Note    Patient Details  Name: Mary Strickland MRN: [id number] Date of Birth: 1929/12/14  Transition of [facility]) CM/SW [place],  Phone Number: 07/24/2021, 10:24 AM  Clinical Narrative:     CSW informed by MD & RN patient spike temperature and is not medically stable for discharge today.  CSW called patient's granddaughter,Mary Strickland ,[employer] and SNF - provided update, patient not medically stable for d/c today.  CSW will continue to follow and assist with discharge planning.   Thurmond Butts, MSW, LCSW Clinical Social Worker    Expected Discharge Plan: Home/Self Care Barriers to Discharge: Barriers Resolved  Expected Discharge Plan and Services Expected Discharge Plan: Home/Self Care In-house Referral: Clinical Social Work     Living arrangements for the past 2 months: Single Family Home Expected Discharge Date: 07/07/21                                     Social Determinants of Health (SDOH) Interventions    Readmission Risk Interventions No flowsheet data found.

## 2021-07-24 NOTE — Care Management Important Message (Signed)
Important Message  Patient Details  Name: Mary Strickland MRN: [id number] Date of Birth: 1930-03-20   Medicare Important Message Given:  Yes     [name] 07/24/2021, 10:40 AM

## 2021-07-24 NOTE — Progress Notes (Signed)
PROGRESS NOTE    Mary Strickland  [id number] DOB: 1930-09-19 DOA: 06/24/2021 PCP: Burnard Bunting, MD   Brief Narrative: Mary Strickland is a 87 y.o. female with a history of tobacco use, hypertension, mitral valve prolapse.  Patient presented secondary to hemoptysis with subsequent severe hypoxia requiring endotracheal intubation.  She was found to have pneumomediastinum and left pneumothorax and was treated with chest tube.  She also had hemoptysis which was treated conservatively.  Patient was initially transition to comfort measures with tubes removed which improved her clinical condition.  During admission she was also treated for pneumonia, COVID-19 infection with pneumonia, acute urinary retention.  Plan is for discharge to SNF.   Assessment & Plan:   Principal Problem:   Acute hypoxemic respiratory failure (HCC) Active Problems:   Hypertension   Hemoptysis   Abnormal CXR   Leukocytosis   Subcutaneous emphysema (HCC)   Pneumoperitoneum of unknown etiology   Pneumothorax on left   Acute respiratory failure with hypoxia Secondary to below problems. Required ICU admission. Intubated on 7/23 and successfully extubated on 7/25.  COVID-19 Pneumonia Multifocal pneumonia Pneumonia initially treated with meropenem IV > Unasyn IV and completed treatment. Prior to discharge, patient tested positive for COVID-19 and was started on Remdesivir and decadron; she has completed course. -Tessalon perles for cough -Will continue airborne/contact precautions secondary to patient still being possibly symptomatic with new fevers  Fever Possibly secondary to above, although she is 14 days out from initial diagnosis. -Chest x-ray, Blood cultures, procalcitonin, sputum culture -Empirically treat for possible superimposed bacterial pneumonia with Ceftriaxone/Azithromycin  Left pneumothorax Pneumomediastinum Hemoptysis Aspirin discontinued. Patient required left chest tube from  7/25-7/28. No intervention for hemoptysis. Resolved.  Acute urinary retention Foley in place since 8/8. Foley catheter discontinued on 8/17. Seems to be tolerating voiding trial. Resolved. -Strict in/out  Demand ischemia In setting of above problems. Cardiology consulted. Transthoracic Echocardiogram significant for normal EF and no wall motion abnormality. Cardiology signed off on 7/29 secondary to decision at that time for patient to transition to comfort measures (which is currently not the case). Will need outpatient follow-up.  Malpositioned left IJ CT neck without focal vascular injury. Removed.  Primary hypertension Patient is on valsartan and amlodipine as an outpatient. -Continue irbesartan (substituted for valsartan)  Obesity Body mass index is 29.63 kg/m.   DVT prophylaxis: Lovenox Code Status:   Code Status: DNR Family Communication: None at bedside. Called granddaughter but no answer Disposition Plan: Discharge to SNF pending new fever workup. Transportation needs to be set up in advance by TOC.    Consultants:  PCCM  Procedures:  INTUBATION/EXTUBATION CHEST TUBE PLACEMENT/REMOVAL TRANSTHORACIC ECHOCARDIOGRAM (06/26/2021) IMPRESSIONS     1. Left ventricular ejection fraction, by estimation, is 60 to 65%. The  left ventricle has normal function. Left ventricular endocardial border  not optimally defined to evaluate regional wall motion. Indeterminate  diastolic filling due to E-A fusion.   2. Right ventricular systolic function is normal. The right ventricular  size is normal. Tricuspid regurgitation signal is inadequate for assessing  PA pressure.   3. The mitral valve was not well visualized. No evidence of mitral valve  regurgitation. No evidence of mitral stenosis. Moderate mitral annular  calcification.   4. The aortic valve is tricuspid. Aortic valve regurgitation is not  visualized. Mild aortic valve sclerosis is present, with no evidence of  aortic  valve stenosis.   5. The inferior vena cava is normal in size with greater than 50%  respiratory variability, suggesting right atrial pressure of 3 mmHg.   6. Technically difficult stutdy with poor acoustic windows.   Antimicrobials: Unasyn IV Meropenem IV  Remdesivir IV   Subjective: Patient with cough. No other concerns.  Objective: Vitals:   07/24/21 0300 07/24/21 0312 07/24/21 0421 07/24/21 0730  BP: (!) 107/50   102/72  Pulse: (!) 102 98  (!) 103  Resp: (!) 28 (!) 23  20  Temp: 99.7 F (37.6 C)   99.8 F (37.7 C)  TempSrc: Oral   Oral  SpO2: 93% 93%  97%  Weight:   73.5 kg   Height:        Intake/Output Summary (Last 24 hours) at 07/24/2021 1005 Last data filed at 07/23/2021 2100 Gross per 24 hour  Intake 240 ml  Output --  Net 240 ml    Filed Weights   07/22/21 0618 07/23/21 0500 07/24/21 0421  Weight: 84.1 kg 75.3 kg 73.5 kg    Examination:  General exam: Appears calm and comfortable Respiratory system: Some rhonchi. Respiratory effort normal. Cardiovascular system: S1 & S2 heard, RRR. No murmurs, rubs, gallops or clicks. Gastrointestinal system: Abdomen is nondistended, soft and nontender. No organomegaly or masses felt. Normal bowel sounds heard. Central nervous system: Alert. No focal neurological deficits. Musculoskeletal: No edema. No calf tenderness Skin: No cyanosis. No rashes Psychiatry: Judgement and insight appear normal. Mood & affect appropriate.    Data Reviewed: I have personally reviewed following labs and imaging studies  CBC Lab Results  Component Value Date   WBC 7.3 07/16/2021   RBC 3.02 (L) 07/16/2021   HGB 9.2 (L) 07/16/2021   HCT 27.5 (L) 07/16/2021   MCV 91.1 07/16/2021   MCH 30.5 07/16/2021   PLT 267 07/16/2021   MCHC 33.5 07/16/2021   RDW 15.4 07/16/2021   LYMPHSABS 1.6 07/16/2021   MONOABS 0.9 07/16/2021   EOSABS 0.0 07/16/2021   BASOSABS 0.0 123456     Last metabolic panel Lab Results  Component Value  Date   NA 133 (L) 07/17/2021   K 4.2 07/17/2021   CL 99 07/17/2021   CO2 27 07/17/2021   BUN 27 (H) 07/17/2021   CREATININE 0.89 07/17/2021   GLUCOSE 84 07/17/2021   GFRNONAA >60 07/17/2021   GFRAA 55 (L) 01/25/2021   CALCIUM 8.6 (L) 07/17/2021   PHOS 2.9 07/01/2021   PROT 6.2 (L) 07/17/2021   ALBUMIN 2.7 (L) 07/17/2021   BILITOT 0.9 07/17/2021   ALKPHOS 37 (L) 07/17/2021   AST 31 07/17/2021   ALT 28 07/17/2021   ANIONGAP 7 07/17/2021    CBG (last 3)  No results for input(s): GLUCAP in the last 72 hours.   GFR: Estimated Creatinine Clearance: 41 mL/min (by C-G formula based on SCr of 0.89 mg/dL).  Coagulation Profile: No results for input(s): INR, PROTIME in the last 168 hours.  No results found for this or any previous visit (from the past 240 hour(s)).       Radiology Studies: No results found.      Scheduled Meds:  vitamin C  500 mg Oral Daily   benzonatate  100 mg Oral TID   Chlorhexidine Gluconate Cloth  6 each Topical Daily   enoxaparin (LOVENOX) injection  40 mg Subcutaneous QHS   feeding supplement  237 mL Oral TID BM   guaiFENesin  1,200 mg Oral BID   irbesartan  37.5 mg Oral Daily   mouth rinse  15 mL Mouth Rinse BID   metoprolol tartrate  12.5 mg Oral BID   multivitamin with minerals  1 tablet Oral Daily   pantoprazole  40 mg Oral BID   zinc sulfate  220 mg Oral Daily   Continuous Infusions:  sodium chloride 500 mL (07/11/21 1647)     LOS: 29 days     Cordelia Poche, MD [facility] 07/24/2021, 10:05 AM  If 7PM-7AM, please contact night-coverage [email or web address]

## 2021-07-24 NOTE — Progress Notes (Signed)
Physical Therapy Treatment Patient Details Name: Mary Strickland MRN: [id number] DOB: [birth year] Today's Date: 07/24/2021    History of Present Illness 87 y.o. female presents to [place] [facility] ED on 06/24/2021 with sudden onset hemoptysis. Pt experienced a bout of hypoxia in ED with AMS and was intubated on 7/23. Pt was transported to [facility] hospital on 7/24, found to have pneumomediastinum, pneumothorax. Chest tube inserted 7/24. Pt extubated on 7/25. Now covid+ as of 07/10/21. PMH includes history of smoking, hypertension, mitral valve prolapse.    PT Comments    Pt received in supine, agreeable to therapy session with encouragement and with good participation and fair tolerance for transfer and gait training. Pt limited due to fatigue/increased work of breathing with exertion and desat to 84% on 2L O2 [name]. Pt SpO2 89-93% on 3L with exertion but continues to demonstrate increased RR and frequent coughing and c/o fatigue so unable to perform household distance gait trial this date and needing increased assist for balance/safety compared with previous PT session. Pt continues to benefit from PT services to progress toward functional mobility goals.    Follow Up Recommendations  SNF;Supervision/Assistance - 24 hour     Equipment Recommendations  Rolling walker with 5" wheels;3in1 (PT)    Recommendations for Other Services       Precautions / Restrictions Precautions Precautions: Fall Precaution Comments: Airborne/Contact precs (Covid+ 8/8, then reinstated 8/22 due to new fever)    Mobility  Bed Mobility Overal bed mobility: Needs Assistance Bed Mobility: Supine to Sit     Supine to sit: HOB elevated;Min assist     General bed mobility comments: cues for hand placement, pt needs minA to advance hips to foot flat    Transfers Overall transfer level: Needs assistance Equipment used: Rolling walker (2 wheeled) Transfers: Sit to/from Stand Sit to Stand: Min assist;From elevated  surface Stand pivot transfers: Min assist       General transfer comment: cues for proper hand placement, from EOB, BSC and chair heights to RW; increased assist needed due to fatigue/impulsivity to sit  Ambulation/Gait Ambulation/Gait assistance: Min guard Gait Distance (Feet): 4 Feet (x2 pivots (~87f to [name] ~426fto chair [name]: Rolling walker (2 wheeled) Gait Pattern/deviations: Step-through pattern;Decreased step length - right;Decreased step length - left;Decreased stride length Gait velocity: decreased   General Gait Details: Patient with improved ambulation tolerance and safety this session. Ambulated on room air with sats down to 88%. Quickly up to 91% once seated. [name] returned to patient while seated in recliner.  Continues to be limited by fatigue but improving.   Stairs             Wheelchair Mobility    Modified Rankin (Stroke Patients Only)       Balance Overall balance assessment: Needs assistance Sitting-balance support: Feet supported Sitting balance-[name] Scale: Good     Standing balance support: Bilateral upper extremity supported;During functional activity Standing balance-[name] Scale: Poor Standing balance comment: reliant on RW for gait and impulsive to sit this date, more fatigued and needing min guard to minA for safety.                            Cognition Arousal/Alertness: Awake/alert Behavior During Therapy: Impulsive Overall Cognitive Status: Within Functional Limits for tasks assessed  General Comments: WFL for basic tasks though noted with some increased confusion and difficulty problem solving, safety techniques, etc. Pt impulsive to sit due to fatigue and needs mod cues for safety/posture and pursed-lip breathing. Slight physical decline from previous session and now very hoarse so whispering/mouthing words and difficult to understand at times (pt continues  to have bad cough).      Exercises General Exercises - Lower Extremity Ankle Circles/Pumps: AROM;Both;10 reps;Seated Other Exercises Other Exercises: STS x 4 total reps    General Comments General comments (skin integrity, edema, etc.): BP 129/51 and SpO2 desat to 84% on 2L O2 [name], increased to 3L O2 [name] and Spo2 89-93%, RN notified it needed to be increased; warm blankets obtained for her she c/o feeling cold.      Pertinent Vitals/Pain Pain Assessment: No/denies pain Pain Location: pt seems to have sore throat but denies other pain Pain Descriptors / Indicators: Grimacing Pain Intervention(s): Monitored during session;Repositioned    Home Living                      Prior Function            PT Goals (current goals can now be found in the care plan section) Acute Rehab PT Goals PT Goal Formulation: With patient Time For Goal Achievement: 08/02/21 Potential to Achieve Goals: Good Progress towards PT goals: Progressing toward goals    Frequency    Min 2X/week      PT Plan Current plan remains appropriate    Co-evaluation              AM-PAC PT "6 Clicks" Mobility   Outcome Measure  Help needed turning from your back to your side while in a flat bed without using bedrails?: A Little Help needed moving from lying on your back to sitting on the side of a flat bed without using bedrails?: A Little Help needed moving to and from a bed to a chair (including a wheelchair)?: A Little Help needed standing up from a chair using your arms (e.g., wheelchair or bedside chair)?: A Little Help needed to walk in hospital room?: A Little Help needed climbing 3-5 steps with a railing? : A Lot 6 Click Score: 17    End of Session Equipment Utilized During Treatment: Oxygen;Gait belt Activity Tolerance: Patient limited by fatigue Patient left: in chair;with call bell/phone within reach;with chair alarm set;Other (comment) (NT notified pt wants to get back to bed at  6pm) Nurse Communication: Mobility status;Other (comment) (SpO2 desat OK to titrate to 3L per RN, pt may need new [place] hers is feeling stiff/not pliable and looks a bit dirty) PT Visit Diagnosis: Muscle weakness (generalized) (M62.81);Other abnormalities of gait and mobility (R26.89);Difficulty in walking, not elsewhere classified (R26.2)     Time: 1620-1700 PT Time Calculation (min) (ACUTE ONLY): 40 min  Charges:  $Gait Training: 8-22 mins $Therapeutic Activity: 8-22 mins                     Shedrick Sarli P., PTA Acute Rehabilitation Services Pager: [phone] Office: [place] 07/24/2021, 5:25 PM

## 2021-07-25 ENCOUNTER — Inpatient Hospital Stay (HOSPITAL_COMMUNITY): Payer: Medicare HMO

## 2021-07-25 DIAGNOSIS — R7881 Bacteremia: Secondary | ICD-10-CM | POA: Diagnosis not present

## 2021-07-25 DIAGNOSIS — U071 COVID-19: Secondary | ICD-10-CM | POA: Diagnosis present

## 2021-07-25 DIAGNOSIS — B9561 Methicillin susceptible Staphylococcus aureus infection as the cause of diseases classified elsewhere: Secondary | ICD-10-CM

## 2021-07-25 DIAGNOSIS — I10 Essential (primary) hypertension: Secondary | ICD-10-CM

## 2021-07-25 DIAGNOSIS — J9601 Acute respiratory failure with hypoxia: Secondary | ICD-10-CM | POA: Diagnosis not present

## 2021-07-25 LAB — BASIC METABOLIC PANEL
Anion gap: 9 (ref 5–15)
BUN: 47 mg/dL — ABNORMAL HIGH (ref 8–23)
CO2: 26 mmol/L (ref 22–32)
Calcium: 8.3 mg/dL — ABNORMAL LOW (ref 8.9–10.3)
Chloride: 98 mmol/L (ref 98–111)
Creatinine, Ser: 1.46 mg/dL — ABNORMAL HIGH (ref 0.44–1.00)
GFR, Estimated: 34 mL/min — ABNORMAL LOW (ref >60)
Glucose, Bld: 94 mg/dL (ref 70–99)
Potassium: 4.3 mmol/L (ref 3.5–5.1)
Sodium: 133 mmol/L — ABNORMAL LOW (ref 135–145)

## 2021-07-25 LAB — ECHOCARDIOGRAM LIMITED
Height: 62 in
S' Lateral: 2.2 cm
Weight: 2640 oz

## 2021-07-25 LAB — BLOOD CULTURE ID PANEL (REFLEXED) - BCID2

## 2021-07-25 LAB — PROCALCITONIN: Procalcitonin: 2.09 ng/mL

## 2021-07-25 LAB — CBC
HCT: 31.3 % — ABNORMAL LOW (ref 36.0–46.0)
Hemoglobin: 10.1 g/dL — ABNORMAL LOW (ref 12.0–15.0)
MCH: 30 pg (ref 26.0–34.0)
MCHC: 32.3 g/dL (ref 30.0–36.0)
MCV: 92.9 fL (ref 80.0–100.0)
Platelets: 210 10*3/uL (ref 150–400)
RBC: 3.37 MIL/uL — ABNORMAL LOW (ref 3.87–5.11)
RDW: 16.3 % — ABNORMAL HIGH (ref 11.5–15.5)
WBC: 12 10*3/uL — ABNORMAL HIGH (ref 4.0–10.5)
nRBC: 0 % (ref 0.0–0.2)

## 2021-07-25 MED ORDER — ENOXAPARIN SODIUM 30 MG/0.3ML IJ SOSY
30.0000 mg | PREFILLED_SYRINGE | Freq: Every day | INTRAMUSCULAR | Status: DC
  Administered 2021-07-25 – 2021-07-26 (×2): 30 mg via SUBCUTANEOUS
  Filled 2021-07-25 (×2): qty 0.3

## 2021-07-25 MED ORDER — CEFAZOLIN SODIUM-DEXTROSE 2-4 GM/100ML-% IV SOLN
2.0000 g | Freq: Two times a day (BID) | INTRAVENOUS | Status: DC
  Administered 2021-07-25 – 2021-07-27 (×5): 2 g via INTRAVENOUS
  Filled 2021-07-25 (×5): qty 100

## 2021-07-25 NOTE — Progress Notes (Signed)
  Echocardiogram 2D Echocardiogram has been performed.  Mary Strickland F 07/25/2021, 1:56 PM

## 2021-07-25 NOTE — Progress Notes (Signed)
PHARMACY - PHYSICIAN COMMUNICATION CRITICAL VALUE ALERT - BLOOD CULTURE IDENTIFICATION (BCID)  Mary Strickland is an 87 y.o. female who presented to [facility] on 06/24/2021 with a chief complaint of sepsis  Assessment:  4/4 BC with MSSA Name of physician (or Provider) Contacted: Abundio Miu  Current antibiotics: rocephin and azithromycin  Changes to prescribed antibiotics recommended:  Change to ancef 2gm IV q12 for renal function  Results for orders placed or performed during the hospital encounter of 06/24/21  Blood Culture ID Panel (Reflexed) (Collected: 07/24/2021 10:48 AM)  Result Value Ref Range   Enterococcus faecalis NOT DETECTED NOT DETECTED   Enterococcus Faecium NOT DETECTED NOT DETECTED   Listeria monocytogenes NOT DETECTED NOT DETECTED   Staphylococcus species DETECTED (A) NOT DETECTED   Staphylococcus aureus (BCID) DETECTED (A) NOT DETECTED   Staphylococcus epidermidis NOT DETECTED NOT DETECTED   Staphylococcus lugdunensis NOT DETECTED NOT DETECTED   Streptococcus species NOT DETECTED NOT DETECTED   Streptococcus agalactiae NOT DETECTED NOT DETECTED   Streptococcus pneumoniae NOT DETECTED NOT DETECTED   Streptococcus pyogenes NOT DETECTED NOT DETECTED   A.calcoaceticus-baumannii NOT DETECTED NOT DETECTED   Bacteroides fragilis NOT DETECTED NOT DETECTED   Enterobacterales NOT DETECTED NOT DETECTED   Enterobacter cloacae complex NOT DETECTED NOT DETECTED   Escherichia coli NOT DETECTED NOT DETECTED   Klebsiella aerogenes NOT DETECTED NOT DETECTED   Klebsiella oxytoca NOT DETECTED NOT DETECTED   Klebsiella pneumoniae NOT DETECTED NOT DETECTED   Proteus species NOT DETECTED NOT DETECTED   Salmonella species NOT DETECTED NOT DETECTED   Serratia marcescens NOT DETECTED NOT DETECTED   Haemophilus influenzae NOT DETECTED NOT DETECTED   Neisseria meningitidis NOT DETECTED NOT DETECTED   Pseudomonas aeruginosa NOT DETECTED NOT DETECTED   Stenotrophomonas maltophilia NOT  DETECTED NOT DETECTED   Candida albicans NOT DETECTED NOT DETECTED   Candida auris NOT DETECTED NOT DETECTED   Candida glabrata NOT DETECTED NOT DETECTED   Candida krusei NOT DETECTED NOT DETECTED   Candida parapsilosis NOT DETECTED NOT DETECTED   Candida tropicalis NOT DETECTED NOT DETECTED   Cryptococcus neoformans/gattii NOT DETECTED NOT DETECTED   Meth resistant mecA/C and MREJ NOT DETECTED NOT DETECTED    Mary Strickland 07/25/2021  6:36 AM

## 2021-07-25 NOTE — Progress Notes (Signed)
PROGRESS NOTE    Mary Strickland  [id number] DOB: [birth year] DOA: 06/24/2021 PCP: Burnard Bunting, MD   Brief Narrative: Mary Strickland is a 87 y.o. female with a history of tobacco use, hypertension, mitral valve prolapse.  Patient presented secondary to hemoptysis with subsequent severe hypoxia requiring endotracheal intubation.  She was found to have pneumomediastinum and left pneumothorax and was treated with chest tube.  She also had hemoptysis which was treated conservatively.  Patient was initially transition to comfort measures with tubes removed which improved her clinical condition.  During admission she was also treated for pneumonia, COVID-19 infection with pneumonia, acute urinary retention.  Prior to second attempt at discharge, patient developed fevers with preliminary BCID significant for staphylococcus aureus (seemingly methicillin sensitive) bacteremia. Eventual plan is for discharge to SNF.   Assessment & Plan:   Principal Problem:   Acute hypoxemic respiratory failure (HCC) Active Problems:   Hypertension   Hemoptysis   Abnormal CXR   Leukocytosis   Subcutaneous emphysema (HCC)   Pneumoperitoneum of unknown etiology   Pneumothorax on left   Acute respiratory failure with hypoxia Secondary to below problems. Required ICU admission. Intubated on 7/23 and successfully extubated on 7/25. In setting of pneumonia.  -Wean to room air as able  COVID-19 Pneumonia Multifocal pneumonia Pneumonia initially treated with meropenem IV > Unasyn IV and completed treatment. Prior to discharge, patient tested positive for COVID-19 and was started on Remdesivir and decadron; she has completed course. -Tessalon perles for cough  MSSA bacteremia Unsure of source, although patient does have lower respiratory symptoms. On the ventilator previously in admission which would be most likely source at this time. Procalcitonin elevated. Sputum culture pending. Patient was  empirically started on Ceftriaxone/Azithromycin on 8/22 and transitioned to cefazolin on 8/23 after blood culture ID. -Follow-up blood cultures -Follow-up sputum culture -ID consult -Will obtain repeat blood cultures today -Continue Cefazolin IV  Left pneumothorax Pneumomediastinum Hemoptysis Aspirin discontinued. Patient required left chest tube from 7/25-7/28. No intervention for hemoptysis. Resolved.  Acute urinary retention Foley in place since 8/8. Foley catheter discontinued on 8/17. Seems to be tolerating voiding trial. Resolved. -Strict in/out  Demand ischemia In setting of above problems. Cardiology consulted. Transthoracic Echocardiogram significant for normal EF and no wall motion abnormality. Cardiology signed off on 7/29 secondary to decision at that time for patient to transition to comfort measures (which is currently not the case). Will need outpatient follow-up.  Malpositioned left IJ CT neck without focal vascular injury. Removed.  Primary hypertension Patient is on valsartan and amlodipine as an outpatient. -Continue irbesartan (substituted for valsartan)  Obesity Body mass index is 30.18 kg/m.   DVT prophylaxis: Lovenox Code Status:   Code Status: DNR Family Communication: Granddaughter on telephone Disposition Plan: Discharge to SNF pending management/treatment of newly diagnosed bacteremia. Transportation needs to be set up in several days in advance by TOC.    Consultants:  PCCM Infectious disease  Procedures:  INTUBATION/EXTUBATION CHEST TUBE PLACEMENT/REMOVAL TRANSTHORACIC ECHOCARDIOGRAM (06/26/2021) IMPRESSIONS     1. Left ventricular ejection fraction, by estimation, is 60 to 65%. The  left ventricle has normal function. Left ventricular endocardial border  not optimally defined to evaluate regional wall motion. Indeterminate  diastolic filling due to E-A fusion.   2. Right ventricular systolic function is normal. The right ventricular   size is normal. Tricuspid regurgitation signal is inadequate for assessing  PA pressure.   3. The mitral valve was not well visualized. No evidence of mitral valve  regurgitation. No evidence of mitral stenosis. Moderate mitral annular  calcification.   4. The aortic valve is tricuspid. Aortic valve regurgitation is not  visualized. Mild aortic valve sclerosis is present, with no evidence of  aortic valve stenosis.   5. The inferior vena cava is normal in size with greater than 50%  respiratory variability, suggesting right atrial pressure of 3 mmHg.   6. Technically difficult stutdy with poor acoustic windows.   Antimicrobials: Unasyn IV Meropenem IV  Remdesivir IV   Subjective: No issues overnight. Waiting for breakfast  Objective: Vitals:   07/24/21 1700 07/24/21 1712 07/24/21 2015 07/25/21 0442  BP:  (!) 129/51  (!) 108/52  Pulse:  99  94  Resp:  '18 11 15  '$ Temp:  97.9 F (36.6 C) 98.2 F (36.8 C) 98.5 F (36.9 C)  TempSrc:  Oral Oral Oral  SpO2: (!) 89% 90% 91% 94%  Weight:    74.8 kg  Height:        Intake/Output Summary (Last 24 hours) at 07/25/2021 0735 Last data filed at 07/24/2021 1800 Gross per 24 hour  Intake 580 ml  Output --  Net 580 ml    Filed Weights   07/23/21 0500 07/24/21 0421 07/25/21 0442  Weight: 75.3 kg 73.5 kg 74.8 kg    Examination:  General exam: Appears calm and comfortable and in no acute distress. Conversant Respiratory: Slight rhonchi on right to auscultation. Respiratory effort normal with no intercostal retractions or use of accessory muscles Cardiovascular: S1 & S2 heard, RRR. No murmurs, rubs, gallops or clicks. No edema Gastrointestinal: Abdomen is non-distended, soft and non-tender. No masses felt. Normal bowel sounds heard Neurologic: No focal neurological deficits Musculoskeletal: No calf tenderness Skin: No cyanosis. No new rashes Psychiatry: Alert and oriented. Memory intact. Mood & affect appropriate   Data  Reviewed: I have personally reviewed following labs and imaging studies  CBC Lab Results  Component Value Date   WBC 12.0 (H) 07/25/2021   RBC 3.37 (L) 07/25/2021   HGB 10.1 (L) 07/25/2021   HCT 31.3 (L) 07/25/2021   MCV 92.9 07/25/2021   MCH 30.0 07/25/2021   PLT 210 07/25/2021   MCHC 32.3 07/25/2021   RDW 16.3 (H) 07/25/2021   LYMPHSABS 1.6 07/16/2021   MONOABS 0.9 07/16/2021   EOSABS 0.0 07/16/2021   BASOSABS 0.0 [id number]     Last metabolic panel Lab Results  Component Value Date   NA 131 (L) 07/24/2021   K 4.1 07/24/2021   CL 96 (L) 07/24/2021   CO2 23 07/24/2021   BUN 43 (H) 07/24/2021   CREATININE 1.25 (H) 07/24/2021   GLUCOSE 96 07/24/2021   GFRNONAA 41 (L) 07/24/2021   GFRAA 55 (L) 01/25/2021   CALCIUM 8.6 (L) 07/24/2021   PHOS 2.9 07/01/2021   PROT 6.8 07/24/2021   ALBUMIN 2.6 (L) 07/24/2021   BILITOT 1.7 (H) 07/24/2021   ALKPHOS 41 07/24/2021   AST 22 07/24/2021   ALT 18 07/24/2021   ANIONGAP 12 07/24/2021    CBG (last 3)  No results for input(s): GLUCAP in the last 72 hours.   GFR: Estimated Creatinine Clearance: 29.5 mL/min (A) (by C-G formula based on SCr of 1.25 mg/dL (H)).  Coagulation Profile: No results for input(s): INR, PROTIME in the last 168 hours.  Recent Results (from the past 240 hour(s))  Culture, blood (routine x 2)     Status: None (Preliminary result)   Collection Time: 07/24/21 10:39 AM   Specimen: BLOOD  Result Value  Ref Range Status   Specimen Description BLOOD LEFT ANTECUBITAL  Final   Special Requests   Final    BOTTLES DRAWN AEROBIC AND ANAEROBIC Blood Culture adequate volume   Culture  Setup Time   Final    GRAM POSITIVE COCCI IN CLUSTERS IN BOTH AEROBIC AND ANAEROBIC BOTTLES Performed at [facility], [place] [address], [place], [place] [address]    Culture PENDING  Incomplete   Report Status PENDING  Incomplete  Culture, blood (routine x 2)     Status: None (Preliminary result)   Collection Time: 07/24/21  10:48 AM   Specimen: BLOOD  Result Value Ref Range Status   Specimen Description BLOOD RIGHT ANTECUBITAL  Final   Special Requests   Final    BOTTLES DRAWN AEROBIC AND ANAEROBIC Blood Culture adequate volume   Culture  Setup Time   Final    GRAM POSITIVE COCCI IN CLUSTERS IN BOTH AEROBIC AND ANAEROBIC BOTTLES CRITICAL RESULT CALLED TO, READ BACK BY AND VERIFIED WITH: L SEAY,PHARMD'@0629'$  07/25/21 [place] Performed at [facility], [place] [address], [place], [place] [address]    Culture PENDING  Incomplete   Report Status PENDING  Incomplete  Blood Culture ID Panel (Reflexed)     Status: Abnormal   Collection Time: 07/24/21 10:48 AM  Result Value Ref Range Status   Enterococcus faecalis NOT DETECTED NOT DETECTED Final   Enterococcus Faecium NOT DETECTED NOT DETECTED Final   Listeria monocytogenes NOT DETECTED NOT DETECTED Final   Staphylococcus species DETECTED (A) NOT DETECTED Final    Comment: CRITICAL RESULT CALLED TO, READ BACK BY AND VERIFIED WITH: L SEAY,PHARMD'@0630'$  07/25/21 [place]    Staphylococcus aureus (BCID) DETECTED (A) NOT DETECTED Final    Comment: CRITICAL RESULT CALLED TO, READ BACK BY AND VERIFIED WITH: L SEAY,PHARMD'@0630'$  07/25/21 [place]    Staphylococcus epidermidis NOT DETECTED NOT DETECTED Final   Staphylococcus lugdunensis NOT DETECTED NOT DETECTED Final   Streptococcus species NOT DETECTED NOT DETECTED Final   Streptococcus agalactiae NOT DETECTED NOT DETECTED Final   Streptococcus pneumoniae NOT DETECTED NOT DETECTED Final   Streptococcus pyogenes NOT DETECTED NOT DETECTED Final   A.calcoaceticus-baumannii NOT DETECTED NOT DETECTED Final   Bacteroides fragilis NOT DETECTED NOT DETECTED Final   Enterobacterales NOT DETECTED NOT DETECTED Final   Enterobacter cloacae complex NOT DETECTED NOT DETECTED Final   Escherichia coli NOT DETECTED NOT DETECTED Final   Klebsiella aerogenes NOT DETECTED NOT DETECTED Final   Klebsiella oxytoca NOT DETECTED NOT DETECTED Final    Klebsiella pneumoniae NOT DETECTED NOT DETECTED Final   Proteus species NOT DETECTED NOT DETECTED Final   Salmonella species NOT DETECTED NOT DETECTED Final   Serratia marcescens NOT DETECTED NOT DETECTED Final   Haemophilus influenzae NOT DETECTED NOT DETECTED Final   Neisseria meningitidis NOT DETECTED NOT DETECTED Final   Pseudomonas aeruginosa NOT DETECTED NOT DETECTED Final   Stenotrophomonas maltophilia NOT DETECTED NOT DETECTED Final   Candida albicans NOT DETECTED NOT DETECTED Final   Candida auris NOT DETECTED NOT DETECTED Final   Candida glabrata NOT DETECTED NOT DETECTED Final   Candida krusei NOT DETECTED NOT DETECTED Final   Candida parapsilosis NOT DETECTED NOT DETECTED Final   Candida tropicalis NOT DETECTED NOT DETECTED Final   Cryptococcus neoformans/gattii NOT DETECTED NOT DETECTED Final   Meth resistant mecA/C and MREJ NOT DETECTED NOT DETECTED Final    Comment: Performed at [facility], [address] [place], [place] [address]  Radiology Studies: DG CHEST PORT 1 VIEW  Result Date: 07/24/2021 CLINICAL DATA:  Cough, wheezing. EXAM: PORTABLE CHEST 1 VIEW COMPARISON:  July 17, 2021. FINDINGS: Stable cardiomediastinal silhouette. No pneumothorax is noted. Stable bilateral lung opacities are noted concerning for multifocal pneumonia. Small bilateral pleural effusions are noted. Bony thorax is unremarkable. IMPRESSION: Stable bilateral lung opacities as described above. Aortic Atherosclerosis (ICD10-I70.0). Electronically Signed   By: Marijo Conception M.D.   On: 07/24/2021 11:59        Scheduled Meds:  vitamin C  500 mg Oral Daily   benzonatate  100 mg Oral TID   Chlorhexidine Gluconate Cloth  6 each Topical Daily   enoxaparin (LOVENOX) injection  40 mg Subcutaneous QHS   feeding supplement  237 mL Oral TID BM   guaiFENesin  1,200 mg Oral BID   irbesartan  37.5 mg Oral Daily   mouth rinse  15 mL Mouth Rinse BID   metoprolol tartrate  12.5 mg Oral  BID   multivitamin with minerals  1 tablet Oral Daily   pantoprazole  40 mg Oral BID   zinc sulfate  220 mg Oral Daily   Continuous Infusions:  sodium chloride 75 mL/hr at 07/24/21 2100   sodium chloride 500 mL (07/11/21 1647)    ceFAZolin (ANCEF) IV       LOS: 30 days     Cordelia Poche, MD [facility] 07/25/2021, 7:35 AM  If 7PM-7AM, please contact night-coverage [email or web address]

## 2021-07-25 NOTE — Consult Note (Signed)
[facility] for Infectious Disease    Date of Admission:  06/24/2021     Total days of antibiotics 14               Reason for Consult: MSSA Bacteremia  Referring Provider: Tivis Ringer / [employer] Primary Care Provider: Burnard Bunting, MD   ASSESSMENT:  [name] is an 87 y/o caucasian female initially admitted with hemoptysis requiring intubation with course complicated by findings of a positive Covid test and now MSSA bacteremia. Covid treated with remdesivir, steroids and bronchodilators. Source of MSSA infection is unclear with possibility of pneumonia. She does not have any other lines/drains that would likely result in infection and PIVs appear without infection. Blood cultures drawn this morning and will get TTE to rule out endocarditis although this appears to be unlikely. Continue with current dose of Cefazolin.   PLAN:  Continue with current dose of Cefazolin. Obtain TTE to check for endocarditis.  Repeat blood cultures in process Continue supportive care as needed per primary team.    Principal Problem:   Acute hypoxemic respiratory failure (HCC) Active Problems:   Hypertension   Hemoptysis   Abnormal CXR   Leukocytosis   Subcutaneous emphysema (HCC)   Pneumoperitoneum of unknown etiology   Pneumothorax on left    vitamin C  500 mg Oral Daily   benzonatate  100 mg Oral TID   Chlorhexidine Gluconate Cloth  6 each Topical Daily   enoxaparin (LOVENOX) injection  40 mg Subcutaneous QHS   feeding supplement  237 mL Oral TID BM   guaiFENesin  1,200 mg Oral BID   irbesartan  37.5 mg Oral Daily   mouth rinse  15 mL Mouth Rinse BID   metoprolol tartrate  12.5 mg Oral BID   multivitamin with minerals  1 tablet Oral Daily   pantoprazole  40 mg Oral BID   zinc sulfate  220 mg Oral Daily     HPI: Mary Strickland is a 87 y.o. female with previous medical history of hypertension, mitral valve prolapse, and former tobacco use admitted with acute onset  hemoptysis.  [name] had an episode of hemoptysis on 06/24/21 arriving to the ED stable and experience acute oxygen desaturation into the 80's with altered mental status requiring intubation. Chest x-ray with worsening left lateral basilar and right parahilar opacities. CT angio chest with no evidence of PE; extensive pneumomediastinum extending into the base of the neck and anterior fascial planes of the abdominal wall; and moderate left pneumothroax.   Initially started on meropenem for 2 days then changed to Unasyn for a total course of 12 days. Successfully extubated on 06/26/21 with chest tube placed. Course was complicated by development of hemoptysis and worsening hypoxemia. Goals of care became comfort care and chest tube removed on 06/29/21. She experienced improvements after removal of chest tube. While awaiting placement for discharge she was found to have a positive Covid test. Given her symptoms of coughing and mild infiltrate she was treated with remdesivir, steroids and bronchodilators. Completed treatment with plans once again to discharge to a skilled facility. Discharge was once again complicated by development of fever of 102.9. Blood cultures drawn are now positive for MSSA.   [name] was initially restarted on azithromycin and ceftriaxone with concern for pneumonia. Chest x-ray with stable bilateral lung opacities. Narrowed to Ancef per pharmacy. Has been afebrile for the past 24 hours since her initial fever. Continues to have cough and coarse lung sounds. No current  lines and unlikely previous line removed over a week ago would be related. Currently frustrated and wanting to go home when safe.   Review of Systems: Review of Systems  Constitutional:  Negative for chills, fever and weight loss.  Respiratory:  Positive for cough. Negative for hemoptysis, shortness of breath and wheezing.   Cardiovascular:  Negative for chest pain and leg swelling.  Gastrointestinal:  Negative for  abdominal pain, constipation, diarrhea, nausea and vomiting.  Skin:  Negative for rash.    Past Medical History:  Diagnosis Date   Anxiety    Back pain    Blood loss anemia    Mild postoperative blood loss anemia   Dyslipidemia    GERD (gastroesophageal reflux disease)    History of diverticulitis of colon    Hypertension    Idiopathic scoliosis    Lumbosacral spondylosis    Mitral valve prolapse    Neuroma, Morton's    both feet,surgery done   Palpitations    hx. of   Psychosis (HCC)    Right carotid bruit    Ulcer    History of ulcers    Social History   Tobacco Use   Smoking status: Former   Smokeless tobacco: Never  Scientific laboratory technician Use: Never used  Substance Use Topics   Alcohol use: No   Drug use: No    Family History  Problem Relation Age of Onset   Heart disease Father    Heart attack Father    Heart disease Mother    Breast cancer Neg Hx     Allergies  Allergen Reactions   Sulfamethoxazole-Trimethoprim Shortness Of Breath and Itching   Percocet [Oxycodone-Acetaminophen] Other (See Comments)    Unknown reaction   Zocor [Simvastatin] Other (See Comments)    Unknown reaction    OBJECTIVE: Blood pressure 132/65, pulse (!) 109, temperature 98 F (36.7 C), temperature source Oral, resp. rate 20, height '5\' 2"'$  (1.575 m), weight 74.8 kg, SpO2 90 %.  Physical Exam Constitutional:      General: She is not in acute distress.    Appearance: She is well-developed.     Interventions: Nasal cannula in place.     Comments: Seated in the chair; pleasant.   Cardiovascular:     Rate and Rhythm: Normal rate and regular rhythm.     Heart sounds: Normal heart sounds.  Pulmonary:     Effort: Pulmonary effort is normal.     Breath sounds: Rhonchi present.  Skin:    General: Skin is warm and dry.  Neurological:     Mental Status: She is alert and oriented to person, place, and time.  Psychiatric:        Behavior: Behavior normal.        Thought Content:  Thought content normal.        Judgment: Judgment normal.    Lab Results Lab Results  Component Value Date   WBC 12.0 (H) 07/25/2021   HGB 10.1 (L) 07/25/2021   HCT 31.3 (L) 07/25/2021   MCV 92.9 07/25/2021   PLT 210 07/25/2021    Lab Results  Component Value Date   CREATININE 1.46 (H) 07/25/2021   BUN 47 (H) 07/25/2021   NA 133 (L) 07/25/2021   K 4.3 07/25/2021   CL 98 07/25/2021   CO2 26 07/25/2021    Lab Results  Component Value Date   ALT 18 07/24/2021   AST 22 07/24/2021   ALKPHOS 41 07/24/2021   BILITOT 1.7 (H)  07/24/2021     Microbiology: Recent Results (from the past 240 hour(s))  Culture, blood (routine x 2)     Status: None (Preliminary result)   Collection Time: 07/24/21 10:39 AM   Specimen: BLOOD  Result Value Ref Range Status   Specimen Description BLOOD LEFT ANTECUBITAL  Final   Special Requests   Final    BOTTLES DRAWN AEROBIC AND ANAEROBIC Blood Culture adequate volume   Culture  Setup Time   Final    GRAM POSITIVE COCCI IN CLUSTERS IN BOTH AEROBIC AND ANAEROBIC BOTTLES    Culture   Final    NO GROWTH < 24 HOURS Performed at [facility], [place] [address] [place], [place] [address]    Report Status PENDING  Incomplete  Culture, blood (routine x 2)     Status: None (Preliminary result)   Collection Time: 07/24/21 10:48 AM   Specimen: BLOOD  Result Value Ref Range Status   Specimen Description BLOOD RIGHT ANTECUBITAL  Final   Special Requests   Final    BOTTLES DRAWN AEROBIC AND ANAEROBIC Blood Culture adequate volume   Culture  Setup Time   Final    GRAM POSITIVE COCCI IN CLUSTERS IN BOTH AEROBIC AND ANAEROBIC BOTTLES CRITICAL RESULT CALLED TO, READ BACK BY AND VERIFIED WITH: L SEAY,PHARMD'@0629'$  07/25/21 [place] Performed at [facility], [place] [address], [place],  [address]    Culture GRAM POSITIVE COCCI  Final   Report Status PENDING  Incomplete  Blood Culture ID Panel (Reflexed)     Status: Abnormal   Collection Time:  07/24/21 10:48 AM  Result Value Ref Range Status   Enterococcus faecalis NOT DETECTED NOT DETECTED Final   Enterococcus Faecium NOT DETECTED NOT DETECTED Final   Listeria monocytogenes NOT DETECTED NOT DETECTED Final   Staphylococcus species DETECTED (A) NOT DETECTED Final    Comment: CRITICAL RESULT CALLED TO, READ BACK BY AND VERIFIED WITH: L SEAY,PHARMD'@0630'$  07/25/21 [place]    Staphylococcus aureus (BCID) DETECTED (A) NOT DETECTED Final    Comment: CRITICAL RESULT CALLED TO, READ BACK BY AND VERIFIED WITH: L SEAY,PHARMD'@0630'$  07/25/21 [place]    Staphylococcus epidermidis NOT DETECTED NOT DETECTED Final   Staphylococcus lugdunensis NOT DETECTED NOT DETECTED Final   Streptococcus species NOT DETECTED NOT DETECTED Final   Streptococcus agalactiae NOT DETECTED NOT DETECTED Final   Streptococcus pneumoniae NOT DETECTED NOT DETECTED Final   Streptococcus pyogenes NOT DETECTED NOT DETECTED Final   A.calcoaceticus-baumannii NOT DETECTED NOT DETECTED Final   Bacteroides fragilis NOT DETECTED NOT DETECTED Final   Enterobacterales NOT DETECTED NOT DETECTED Final   Enterobacter cloacae complex NOT DETECTED NOT DETECTED Final   Escherichia coli NOT DETECTED NOT DETECTED Final   Klebsiella aerogenes NOT DETECTED NOT DETECTED Final   Klebsiella oxytoca NOT DETECTED NOT DETECTED Final   Klebsiella pneumoniae NOT DETECTED NOT DETECTED Final   Proteus species NOT DETECTED NOT DETECTED Final   Salmonella species NOT DETECTED NOT DETECTED Final   Serratia marcescens NOT DETECTED NOT DETECTED Final   Haemophilus influenzae NOT DETECTED NOT DETECTED Final   Neisseria meningitidis NOT DETECTED NOT DETECTED Final   Pseudomonas aeruginosa NOT DETECTED NOT DETECTED Final   Stenotrophomonas maltophilia NOT DETECTED NOT DETECTED Final   Candida albicans NOT DETECTED NOT DETECTED Final   Candida auris NOT DETECTED NOT DETECTED Final   Candida glabrata NOT DETECTED NOT DETECTED Final   Candida krusei NOT DETECTED  NOT DETECTED Final   Candida parapsilosis NOT DETECTED NOT DETECTED Final  Candida tropicalis NOT DETECTED NOT DETECTED Final   Cryptococcus neoformans/gattii NOT DETECTED NOT DETECTED Final   Meth resistant mecA/C and MREJ NOT DETECTED NOT DETECTED Final    Comment: Performed at [facility], [place] [address] [place], [place] [address]     Terri Piedra, [facility]  07/25/2021  10:35 AM

## 2021-07-25 NOTE — Progress Notes (Signed)
Occupational Therapy Treatment Patient Details Name: Mary Strickland MRN: [id number] DOB: [birth year] Today's Date: 07/25/2021    History of present illness 87 y.o. female presents to [facility] ED on 06/24/2021 with sudden onset hemoptysis. Pt experienced a bout of hypoxia in ED with AMS and was intubated on 7/23. Pt was transported to [facility] hospital on 7/24, found to have pneumomediastinum, pneumothorax. Chest tube inserted 7/24. Pt extubated on 7/25. Now covid+ as of 07/10/21. PMH includes history of smoking, hypertension, mitral valve prolapse.   OT comments  Pt progressing gradually towards OT goals. On entry, pt requesting bathroom assist prior to return to bed. Initially, pt requested BSC use but with encouragement, able to demo mobility to/from bathroom using RW at Supervision level. Pt notably fatigued and increased dyspnea noted with activity on 2 L O2.. Pt overall Min A for toileting task though improving balance noted. Continue to rec SNF rehab at [place].    Follow Up Recommendations  SNF    Equipment Recommendations  Other (comment) (Rolling walker)    Recommendations for Other Services      Precautions / Restrictions Precautions Precautions: Fall Precaution Comments: Airborne/Contact precs (Covid+ 8/8, then reinstated 8/22 due to new fever) Restrictions Weight Bearing Restrictions: No       Mobility Bed Mobility Overal bed mobility: Needs Assistance Bed Mobility: Sit to Supine       Sit to supine: Supervision   General bed mobility comments: cues to initiate task    Transfers Overall transfer level: Needs assistance Equipment used: Rolling walker (2 wheeled) Transfers: Sit to/from Stand Sit to Stand: Min guard         General transfer comment: min guard and increased time to rise from recliner and regular toilet. good problem solving of hand placement    Balance Overall balance assessment: Needs assistance Sitting-balance support: Feet supported Sitting  balance-[name] Scale: Good     Standing balance support: Bilateral upper extremity supported;During functional activity Standing balance-[name] Scale: Poor Standing balance comment: reliant on at least one UE support in standing                           ADL either performed or assessed with clinical judgement   ADL Overall ADL's : Needs assistance/impaired Eating/Feeding: Set up;Sitting                       Toilet Transfer: Supervision/safety;Ambulation;Regular Toilet;RW;Grab bars Toilet Transfer Details (indicate cue type and reason): light use of grab bars to pull up from regular toilet. increased time to rise Toileting- Clothing Manipulation and Hygiene: Minimal assistance;Sit to/from stand Toileting - Clothing Manipulation Details (indicate cue type and reason): for thoroughness, clothing mgmt in standing.     Functional mobility during ADLs: Supervision/safety;Rolling walker General ADL Comments: Pt with noted increasing fatigue with mobility to/from bathroom, requiring standing rest breaks and increased time. Continues to require supplemental O2 to maintain sats     Vision   Vision Assessment?: No apparent visual deficits   Perception     Praxis      Cognition Arousal/Alertness: Awake/alert Behavior During Therapy: Flat affect Overall Cognitive Status: Impaired/Different from baseline Area of Impairment: Problem solving;Awareness                           Awareness: Emergent Problem Solving: Slow processing;Difficulty sequencing;Requires verbal cues General Comments: WFL for basic tasks though noted with some increased  confusion and difficulty problem solving, safety techniques, etc. Increased time/cueing for following directions at times. Noted to be very soft spoken during session        Exercises     Shoulder Instructions       General Comments Noted 3/4 DOE with mobility, recovers quickly with seated rest break. Questionable  desat on 2 L O2 with activity    Pertinent Vitals/ Pain       Pain Assessment: No/denies pain Pain Intervention(s): Monitored during session  Home Living                                          Prior Functioning/Environment              Frequency  Min 2X/week        Progress Toward Goals  OT Goals(current goals can now be found in the care plan section)  Progress towards OT goals: Progressing toward goals  Acute Rehab OT Goals Patient Stated Goal: get stronger and go home. OT Goal Formulation: With patient Time For Goal Achievement: 07/31/21 Potential to Achieve Goals: Fair ADL Goals Pt Will Perform Grooming: with set-up;standing Pt Will Perform Lower Body Bathing: with set-up;sit to/from stand Pt Will Perform Lower Body Dressing: with set-up;sit to/from stand Pt Will Transfer to Toilet: with supervision;ambulating Pt Will Perform Toileting - Clothing Manipulation and hygiene: with set-up;sit to/from stand;sitting/lateral leans Additional ADL Goal #1: Pt will tolerate OOB functional activity for at least 5 min to maximize ADL/mobility endurance  Plan Discharge plan remains appropriate    Co-evaluation                 AM-PAC OT "6 Clicks" Daily Activity     Outcome Measure   Help from another person eating meals?: None Help from another person taking care of personal grooming?: A Little Help from another person toileting, which includes using toliet, bedpan, or urinal?: A Little Help from another person bathing (including washing, rinsing, drying)?: A Lot Help from another person to put on and taking off regular upper body clothing?: A Little Help from another person to put on and taking off regular lower body clothing?: A Lot 6 Click Score: 17    End of Session Equipment Utilized During Treatment: Rolling walker;Oxygen  OT Visit Diagnosis: Unsteadiness on feet (R26.81);Muscle weakness (generalized) (M62.81);Pain   Activity  Tolerance Patient tolerated treatment well   Patient Left in bed;with call bell/phone within reach   Nurse Communication Mobility status;Other (comment)        TimePO:[id number] OT Time Calculation (min): 38 min  Charges: OT General Charges $OT Visit: 1 Visit OT Treatments $Self Care/Home Management : 8-22 mins $Therapeutic Activity: 8-22 mins  Malachy Chamber, OTR/L Acute Rehab Services Office: [phone]    Layla Maw 07/25/2021, 12:18 PM

## 2021-07-26 DIAGNOSIS — R7989 Other specified abnormal findings of blood chemistry: Secondary | ICD-10-CM

## 2021-07-26 DIAGNOSIS — R7881 Bacteremia: Secondary | ICD-10-CM | POA: Diagnosis not present

## 2021-07-26 DIAGNOSIS — B9561 Methicillin susceptible Staphylococcus aureus infection as the cause of diseases classified elsewhere: Secondary | ICD-10-CM | POA: Diagnosis not present

## 2021-07-26 DIAGNOSIS — J9601 Acute respiratory failure with hypoxia: Secondary | ICD-10-CM | POA: Diagnosis not present

## 2021-07-26 DIAGNOSIS — U071 COVID-19: Secondary | ICD-10-CM

## 2021-07-26 LAB — PROCALCITONIN: Procalcitonin: 2 ng/mL

## 2021-07-26 MED ORDER — SODIUM CHLORIDE 0.9 % IV SOLN: INTRAVENOUS | Status: AC

## 2021-07-26 NOTE — Progress Notes (Signed)
[facility]  Date of Admission:  06/24/2021     Total days of antibiotics 15         ASSESSMENT:  Ms. Cacal blood cultures from 07/25/21 have been without growth thus far. Clinically appears to be improving slowly. TTE with no endocarditis. No current need to pursue TEE at this point. No clear source of infection outside potential pulmonary source. Continue current dose of Cefazolin. Will consider possible oral therapy once blood cultures cleared. Supportive care per primary team. Will continue to follow.   PLAN:  Continue Cefazolin. Monitor cultures for clearance of bacteremia. Additional treatment recommendations pending clearance of blood cultures. Remaining supportive care as needed per primary team.   Principal Problem:   MSSA bacteremia Active Problems:   Hypertension   Hemoptysis   Acute hypoxemic respiratory failure (HCC)   Abnormal CXR   Leukocytosis   Subcutaneous emphysema (HCC)   Pneumoperitoneum of unknown etiology   Pneumothorax on left   COVID    vitamin C  500 mg Oral Daily   benzonatate  100 mg Oral TID   Chlorhexidine Gluconate Cloth  6 each Topical Daily   enoxaparin (LOVENOX) injection  30 mg Subcutaneous QHS   feeding supplement  237 mL Oral TID BM   guaiFENesin  1,200 mg Oral BID   mouth rinse  15 mL Mouth Rinse BID   metoprolol tartrate  12.5 mg Oral BID   multivitamin with minerals  1 tablet Oral Daily   pantoprazole  40 mg Oral BID   zinc sulfate  220 mg Oral Daily    SUBJECTIVE:  Afebrile overnight with no acute events. Feeling better today and able to cough up sputum. Working with flutter valve. No new concerns/complaints.   Allergies  Allergen Reactions   Sulfamethoxazole-Trimethoprim Shortness Of Breath and Itching   Percocet [Oxycodone-Acetaminophen] Other (See Comments)    Unknown reaction   Zocor [Simvastatin] Other (See Comments)    Unknown reaction     Review of Systems: Review of Systems   Constitutional:  Negative for chills, fever and weight loss.  Respiratory:  Positive for cough and sputum production. Negative for shortness of breath and wheezing.   Cardiovascular:  Negative for chest pain and leg swelling.  Gastrointestinal:  Negative for abdominal pain, constipation, diarrhea, nausea and vomiting.  Skin:  Negative for rash.     OBJECTIVE: Vitals:   07/25/21 2016 07/25/21 2329 07/26/21 0431 07/26/21 0757  BP: 136/66 117/64 (!) 133/58 (!) 144/73  Pulse: 86 85 83 96  Resp: '18 18 18 20  '$ Temp: 98.6 F (37 C) 98.2 F (36.8 C) 98.7 F (37.1 C) 98.8 F (37.1 C)  TempSrc: Oral Oral Oral Oral  SpO2: 93% 99% 100% 98%  Weight:   78.9 kg   Height:       Body mass index is 31.83 kg/m.  Physical Exam Constitutional:      General: She is not in acute distress.    Appearance: She is well-developed.     Interventions: Nasal cannula in place.  Cardiovascular:     Rate and Rhythm: Normal rate and regular rhythm.     Heart sounds: Normal heart sounds.  Pulmonary:     Effort: Pulmonary effort is normal.     Breath sounds: Rhonchi and rales present.  Skin:    General: Skin is warm and dry.  Neurological:     Mental Status: She is alert and oriented to person, place, and time.  Psychiatric:  Behavior: Behavior normal.        Thought Content: Thought content normal.        Judgment: Judgment normal.    Lab Results Lab Results  Component Value Date   WBC 12.0 (H) 07/25/2021   HGB 10.1 (L) 07/25/2021   HCT 31.3 (L) 07/25/2021   MCV 92.9 07/25/2021   PLT 210 07/25/2021    Lab Results  Component Value Date   CREATININE 1.46 (H) 07/25/2021   BUN 47 (H) 07/25/2021   NA 133 (L) 07/25/2021   K 4.3 07/25/2021   CL 98 07/25/2021   CO2 26 07/25/2021    Lab Results  Component Value Date   ALT 18 07/24/2021   AST 22 07/24/2021   ALKPHOS 41 07/24/2021   BILITOT 1.7 (H) 07/24/2021     Microbiology: Recent Results (from the past 240 hour(s))  Culture,  blood (routine x 2)     Status: Abnormal (Preliminary result)   Collection Time: 07/24/21 10:39 AM   Specimen: BLOOD  Result Value Ref Range Status   Specimen Description BLOOD LEFT ANTECUBITAL  Final   Special Requests   Final    BOTTLES DRAWN AEROBIC AND ANAEROBIC Blood Culture adequate volume   Culture  Setup Time   Final    GRAM POSITIVE COCCI IN CLUSTERS IN BOTH AEROBIC AND ANAEROBIC BOTTLES Performed at [facility], [place] [address], [place], [place] [address]    Culture STAPHYLOCOCCUS AUREUS (A)  Final   Report Status PENDING  Incomplete  Culture, blood (routine x 2)     Status: None (Preliminary result)   Collection Time: 07/24/21 10:48 AM   Specimen: BLOOD  Result Value Ref Range Status   Specimen Description BLOOD RIGHT ANTECUBITAL  Final   Special Requests   Final    BOTTLES DRAWN AEROBIC AND ANAEROBIC Blood Culture adequate volume   Culture  Setup Time   Final    GRAM POSITIVE COCCI IN CLUSTERS IN BOTH AEROBIC AND ANAEROBIC BOTTLES CRITICAL RESULT CALLED TO, READ BACK BY AND VERIFIED WITH: L SEAY,PHARMD'@0629'$  07/25/21 [place] Performed at [facility], [place] [address], [place], [place] [address]    Culture GRAM POSITIVE COCCI  Final   Report Status PENDING  Incomplete  Blood Culture ID Panel (Reflexed)     Status: Abnormal   Collection Time: 07/24/21 10:48 AM  Result Value Ref Range Status   Enterococcus faecalis NOT DETECTED NOT DETECTED Final   Enterococcus Faecium NOT DETECTED NOT DETECTED Final   Listeria monocytogenes NOT DETECTED NOT DETECTED Final   Staphylococcus species DETECTED (A) NOT DETECTED Final    Comment: CRITICAL RESULT CALLED TO, READ BACK BY AND VERIFIED WITH: L SEAY,PHARMD'@0630'$  07/25/21 [place]    Staphylococcus aureus (BCID) DETECTED (A) NOT DETECTED Final    Comment: CRITICAL RESULT CALLED TO, READ BACK BY AND VERIFIED WITH: L SEAY,PHARMD'@0630'$  07/25/21 [place]    Staphylococcus epidermidis NOT DETECTED NOT DETECTED Final   Staphylococcus  lugdunensis NOT DETECTED NOT DETECTED Final   Streptococcus species NOT DETECTED NOT DETECTED Final   Streptococcus agalactiae NOT DETECTED NOT DETECTED Final   Streptococcus pneumoniae NOT DETECTED NOT DETECTED Final   Streptococcus pyogenes NOT DETECTED NOT DETECTED Final   A.calcoaceticus-baumannii NOT DETECTED NOT DETECTED Final   Bacteroides fragilis NOT DETECTED NOT DETECTED Final   Enterobacterales NOT DETECTED NOT DETECTED Final   Enterobacter cloacae complex NOT DETECTED NOT DETECTED Final   Escherichia coli NOT DETECTED NOT DETECTED Final   Klebsiella aerogenes NOT DETECTED NOT  DETECTED Final   Klebsiella oxytoca NOT DETECTED NOT DETECTED Final   Klebsiella pneumoniae NOT DETECTED NOT DETECTED Final   Proteus species NOT DETECTED NOT DETECTED Final   Salmonella species NOT DETECTED NOT DETECTED Final   Serratia marcescens NOT DETECTED NOT DETECTED Final   Haemophilus influenzae NOT DETECTED NOT DETECTED Final   Neisseria meningitidis NOT DETECTED NOT DETECTED Final   Pseudomonas aeruginosa NOT DETECTED NOT DETECTED Final   Stenotrophomonas maltophilia NOT DETECTED NOT DETECTED Final   Candida albicans NOT DETECTED NOT DETECTED Final   Candida auris NOT DETECTED NOT DETECTED Final   Candida glabrata NOT DETECTED NOT DETECTED Final   Candida krusei NOT DETECTED NOT DETECTED Final   Candida parapsilosis NOT DETECTED NOT DETECTED Final   Candida tropicalis NOT DETECTED NOT DETECTED Final   Cryptococcus neoformans/gattii NOT DETECTED NOT DETECTED Final   Meth resistant mecA/C and MREJ NOT DETECTED NOT DETECTED Final    Comment: Performed at [facility], [place] [address], [place], [place] [address]  Culture, blood (routine x 2)     Status: None (Preliminary result)   Collection Time: 07/25/21  8:06 AM   Specimen: BLOOD  Result Value Ref Range Status   Specimen Description BLOOD LEFT ANTECUBITAL  Final   Special Requests   Final    BOTTLES DRAWN AEROBIC AND ANAEROBIC Blood  Culture adequate volume   Culture   Final    NO GROWTH < 24 HOURS Performed at [facility], [place] [address] [place], [place] [address]    Report Status PENDING  Incomplete  Culture, blood (routine x 2)     Status: None (Preliminary result)   Collection Time: 07/25/21  8:06 AM   Specimen: BLOOD  Result Value Ref Range Status   Specimen Description BLOOD LEFT ANTECUBITAL  Final   Special Requests   Final    BOTTLES DRAWN AEROBIC AND ANAEROBIC Blood Culture adequate volume   Culture   Final    NO GROWTH < 24 HOURS Performed at [facility], [place] [address] [place], [place] [address]    Report Status PENDING  Incomplete     Terri Piedra, NP [facility]  07/26/2021  11:23 AM

## 2021-07-26 NOTE — Progress Notes (Signed)
[facility] PROGRESS NOTE    Progress Note  Mary Strickland  [id number] DOB: [birth year] DOA: 06/24/2021 PCP: Burnard Bunting, MD     Brief Narrative:   Mary Strickland is an 87 y.o. female past medical history of tobacco abuse, essential hypertension came in complaining of hemoptysis severe hypoxia requiring endotracheal intubation.  Was found to have a pneumomediastinum with a more thorax with chest tube placement.  Her hemoptysis was treated conservatively patient was initially transferred to comfort measures with a chest tube removed.  During her admission she was also treated for pneumonia thought to be COVID-19, developed acute urinary retention.  Prior to second attempt to discharge patient developed fevers BC ID was significant for staph aureus MSSA.    Assessment/Plan:   Acute respiratory failure with hypoxia likely secondary to COVID-19 pneumonia/multifocal pneumonia: Treated empirically with antibiotics for which she completed her course in-house. She also completed her course of remdesivir and Decadron. Continue out of bed to chair, incentive spirometry and continue to work with physical therapy. Physical therapy recommended skilled nursing facility.  Left pneumothorax/pneumomediastinum/hemoptysis: Aspirin discontinued she required chest tube from 06/26/2021 to 06/29/2021 no further hemoptysis or intervention.  MSSA bacteremia: Unknown source patient did have lower respiratory tract infection required intubation. Sputum cultures Blood cultures He was started empirically on Rocephin and azithromycin on 07/24/2021 and now transition to cefazolin on 07/25/2021. ID was consulted repeated blood cultures on 07/25/2021 are negative till date. She has remained afebrile. Continue IV cefazolin.  Acute urinary retention: Foley was placed on 07/10/2021 discontinued on 07/19/2021 now resolved. Continue strict I's and O's.  Elevated troponins likely due to demand  ischemia: Cardiology was consulted TEE showed no wall motion abnormalities preserved EF, cardiology signed off on 06/30/2021 secondary to decision of patient transitioning to comfort measures which is not the case at this point in time. Will need an outpatient follow-up with cardiology.  Malpositioned left IJ: CT neck without focal vascular injuries now discontinued.  Essential hypertension: Continue current antihypertensive medication blood pressure seems to be well controlled.   DVT prophylaxis: lovenox Family Communication:none Status is: Inpatient  Remains inpatient appropriate because:Hemodynamically unstable  Dispo: The patient is from: Home              Anticipated d/c is to: Home              Patient currently is not medically stable to d/c.   Difficult to place patient No        Code Status:     Code Status Orders  (From admission, onward)           Start     Ordered   06/25/21 0354  Do not attempt resuscitation (DNR)  Continuous       Question Answer Comment  In the event of cardiac or respiratory ARREST Do not call a "code blue"   In the event of cardiac or respiratory ARREST Do not perform Intubation, CPR, defibrillation or ACLS   In the event of cardiac or respiratory ARREST Use medication by any route, position, wound care, and other measures to relive pain and suffering. May use oxygen, suction and manual treatment of airway obstruction as needed for comfort.      06/25/21 0353           Code Status History     Date Active Date Inactive Code Status Order ID Comments User Context   06/25/2021 0314 06/25/2021 0353 Full Code [id number]  Renee Pain, MD ED  IV Access:   Peripheral IV   Procedures and diagnostic studies:   DG CHEST PORT 1 VIEW  Result Date: 07/24/2021 CLINICAL DATA:  Cough, wheezing. EXAM: PORTABLE CHEST 1 VIEW COMPARISON:  July 17, 2021. FINDINGS: Stable cardiomediastinal silhouette. No pneumothorax is noted.  Stable bilateral lung opacities are noted concerning for multifocal pneumonia. Small bilateral pleural effusions are noted. Bony thorax is unremarkable. IMPRESSION: Stable bilateral lung opacities as described above. Aortic Atherosclerosis (ICD10-I70.0). Electronically Signed   By: Marijo Conception M.D.   On: 07/24/2021 11:59   ECHOCARDIOGRAM LIMITED  Result Date: 07/25/2021    ECHOCARDIOGRAM LIMITED REPORT   Patient Name:   Mary Strickland Date of Exam: 07/25/2021 Medical Rec #:  [id number]           Height:       62.0 in Accession #:    [id number]          Weight:       165.0 lb Date of Birth:  October 23, 1930           BSA:          1.762 m Patient Age:    87 years            BP:           132/65 mmHg Patient Gender: F                   HR:           95 bpm. Exam Location:  Inpatient Procedure: Limited Echo, Limited Color Doppler and Color Doppler Indications:    Bacteremia  History:        Patient has prior history of Echocardiogram examinations. Risk                 Factors:Hypertension, Dyslipidemia and Former Smoker. Covid 19                 positive.  Sonographer:    Merrie Roof RDCS Referring Phys: [place]  1. Left ventricular ejection fraction, by estimation, is 60 to 65%. The left ventricle has normal function. There is mild left ventricular hypertrophy.  2. Right ventricular systolic function is normal. The right ventricular size is normal. There is normal pulmonary artery systolic pressure.  3. The mitral valve is degenerative. Mild mitral valve regurgitation.  4. The aortic valve is abnormal. There is mild calcification of the aortic valve. There is moderate thickening of the aortic valve. Aortic valve regurgitation is trivial.  5. The inferior vena cava is normal in size with greater than 50% respiratory variability, suggesting right atrial pressure of 3 mmHg. Conclusion(s)/Recommendation(s): No evidence of valvular vegetations on this transthoracic echocardiogram. FINDINGS   Left Ventricle: Left ventricular ejection fraction, by estimation, is 60 to 65%. The left ventricle has normal function. There is mild left ventricular hypertrophy. Right Ventricle: The right ventricular size is normal. No increase in right ventricular wall thickness. Right ventricular systolic function is normal. There is normal pulmonary artery systolic pressure. The tricuspid regurgitant velocity is 2.85 m/s, and  with an assumed right atrial pressure of 3 mmHg, the estimated right ventricular systolic pressure is [id number] mmHg. Mitral Valve: The mitral valve is degenerative in appearance. Mild to moderate mitral annular calcification. Mild mitral valve regurgitation. Tricuspid Valve: The tricuspid valve is grossly normal. Tricuspid valve regurgitation is mild. Aortic Valve: The aortic valve is abnormal. There is mild calcification of the aortic valve. There is moderate thickening  of the aortic valve. Aortic valve regurgitation is trivial. Pulmonic Valve: The pulmonic valve was thickened with good excursion. Pulmonic valve regurgitation is mild. Aorta: The aortic root is normal in size and structure. Venous: The inferior vena cava is normal in size with greater than 50% respiratory variability, suggesting right atrial pressure of 3 mmHg. LEFT VENTRICLE PLAX 2D LVIDd:         3.60 cm LVIDs:         2.20 cm LV PW:         1.00 cm LV IVS:        1.20 cm  LEFT ATRIUM         Index LA diam:    2.80 cm 1.59 cm/m   AORTA Ao Root diam: 3.20 cm TRICUSPID VALVE TR Peak grad:   32.5 mmHg TR Vmax:        285.00 cm/s Cherlynn Kaiser MD Electronically signed by Cherlynn Kaiser MD Signature Date/Time: 07/25/2021/4:56:35 PM    Final      Medical Consultants:   None.   Subjective:    Mary Strickland relates she continues to cough no further complaints.  Objective:    Vitals:   07/25/21 1634 07/25/21 2016 07/25/21 2329 07/26/21 0431  BP: (!) 125/55 136/66 117/64 (!) 133/58  Pulse: 90 86 85 83  Resp: '20 18 18 18   '$ Temp: 98.4 F (36.9 C) 98.6 F (37 C) 98.2 F (36.8 C) 98.7 F (37.1 C)  TempSrc: Oral Oral Oral Oral  SpO2: 96% 93% 99% 100%  Weight:    78.9 kg  Height:       SpO2: 100 % O2 Flow Rate (L/min): 3 L/min FiO2 (%): 40 %   Intake/Output Summary (Last 24 hours) at 07/26/2021 0649 Last data filed at 07/26/2021 [id number] Gross per 24 hour  Intake 2067.5 ml  Output 1000 ml  Net 1067.5 ml   Filed Weights   07/24/21 0421 07/25/21 0442 07/26/21 0431  Weight: 73.5 kg 74.8 kg 78.9 kg    Exam: General exam: In no acute distress. Respiratory system: Good air movement and clear to auscultation. Cardiovascular system: S1 & S2 heard, RRR. No JVD.  Gastrointestinal system: Abdomen is nondistended, soft and nontender.  Extremities: No pedal edema. Skin: No rashes, lesions or ulcers   Data Reviewed:    Labs: Basic Metabolic Panel: Recent Labs  Lab 07/24/21 1109 07/25/21 0807  NA 131* 133*  K 4.1 4.3  CL 96* 98  CO2 23 26  GLUCOSE 96 94  BUN 43* 47*  CREATININE 1.25* 1.46*  CALCIUM 8.6* 8.3*   GFR Estimated Creatinine Clearance: 25.9 mL/min (A) (by C-G formula based on SCr of 1.46 mg/dL (H)). Liver Function Tests: Recent Labs  Lab 07/24/21 1109  AST 22  ALT 18  ALKPHOS 41  BILITOT 1.7*  PROT 6.8  ALBUMIN 2.6*   No results for input(s): LIPASE, AMYLASE in the last 168 hours. No results for input(s): AMMONIA in the last 168 hours. Coagulation profile No results for input(s): INR, PROTIME in the last 168 hours. COVID-19 Labs  No results for input(s): DDIMER, FERRITIN, LDH, CRP in the last 72 hours.  Lab Results  Component Value Date   SARSCOV2NAA POSITIVE (A) 07/10/2021   [place] NEGATIVE 06/24/2021    CBC: Recent Labs  Lab 07/24/21 1109 07/25/21 0100  WBC 11.9* 12.0*  HGB 10.5* 10.1*  HCT 32.8* 31.3*  MCV 93.2 92.9  PLT 206 210   Cardiac Enzymes: No results for input(s):  CKTOTAL, CKMB, CKMBINDEX, TROPONINI in the last 168 hours. BNP (last 3  results) No results for input(s): PROBNP in the last 8760 hours. CBG: No results for input(s): GLUCAP in the last 168 hours. D-Dimer: No results for input(s): DDIMER in the last 72 hours. Hgb A1c: No results for input(s): HGBA1C in the last 72 hours. Lipid Profile: No results for input(s): CHOL, HDL, LDLCALC, TRIG, CHOLHDL, LDLDIRECT in the last 72 hours. Thyroid function studies: No results for input(s): TSH, T4TOTAL, T3FREE, THYROIDAB in the last 72 hours.  Invalid input(s): FREET3 Anemia work up: No results for input(s): VITAMINB12, FOLATE, FERRITIN, TIBC, IRON, RETICCTPCT in the last 72 hours. Sepsis Labs: Recent Labs  Lab 07/24/21 1109 07/25/21 0100 07/26/21 0333  PROCALCITON 0.58 2.09 2.00  WBC 11.9* 12.0*  --    Microbiology Recent Results (from the past 240 hour(s))  Culture, blood (routine x 2)     Status: None (Preliminary result)   Collection Time: 07/24/21 10:39 AM   Specimen: BLOOD  Result Value Ref Range Status   Specimen Description BLOOD LEFT ANTECUBITAL  Final   Special Requests   Final    BOTTLES DRAWN AEROBIC AND ANAEROBIC Blood Culture adequate volume   Culture  Setup Time   Final    GRAM POSITIVE COCCI IN CLUSTERS IN BOTH AEROBIC AND ANAEROBIC BOTTLES    Culture   Final    NO GROWTH < 24 HOURS Performed at [facility], [place] [address], [place], [place] [address]    Report Status PENDING  Incomplete  Culture, blood (routine x 2)     Status: None (Preliminary result)   Collection Time: 07/24/21 10:48 AM   Specimen: BLOOD  Result Value Ref Range Status   Specimen Description BLOOD RIGHT ANTECUBITAL  Final   Special Requests   Final    BOTTLES DRAWN AEROBIC AND ANAEROBIC Blood Culture adequate volume   Culture  Setup Time   Final    GRAM POSITIVE COCCI IN CLUSTERS IN BOTH AEROBIC AND ANAEROBIC BOTTLES CRITICAL RESULT CALLED TO, READ BACK BY AND VERIFIED WITH: L SEAY,PHARMD'@0629'$  07/25/21 [place] Performed at [facility], [place] [address], [place], [place] [address]    Culture GRAM POSITIVE COCCI  Final   Report Status PENDING  Incomplete  Blood Culture ID Panel (Reflexed)     Status: Abnormal   Collection Time: 07/24/21 10:48 AM  Result Value Ref Range Status   Enterococcus faecalis NOT DETECTED NOT DETECTED Final   Enterococcus Faecium NOT DETECTED NOT DETECTED Final   Listeria monocytogenes NOT DETECTED NOT DETECTED Final   Staphylococcus species DETECTED (A) NOT DETECTED Final    Comment: CRITICAL RESULT CALLED TO, READ BACK BY AND VERIFIED WITH: L SEAY,PHARMD'@0630'$  07/25/21 [place]    Staphylococcus aureus (BCID) DETECTED (A) NOT DETECTED Final    Comment: CRITICAL RESULT CALLED TO, READ BACK BY AND VERIFIED WITH: L SEAY,PHARMD'@0630'$  07/25/21 [place]    Staphylococcus epidermidis NOT DETECTED NOT DETECTED Final   Staphylococcus lugdunensis NOT DETECTED NOT DETECTED Final   Streptococcus species NOT DETECTED NOT DETECTED Final   Streptococcus agalactiae NOT DETECTED NOT DETECTED Final   Streptococcus pneumoniae NOT DETECTED NOT DETECTED Final   Streptococcus pyogenes NOT DETECTED NOT DETECTED Final   A.calcoaceticus-baumannii NOT DETECTED NOT DETECTED Final   Bacteroides fragilis NOT DETECTED NOT DETECTED Final   Enterobacterales NOT DETECTED NOT DETECTED Final   Enterobacter cloacae complex NOT DETECTED NOT DETECTED Final   Escherichia coli NOT DETECTED NOT DETECTED Final  Klebsiella aerogenes NOT DETECTED NOT DETECTED Final   Klebsiella oxytoca NOT DETECTED NOT DETECTED Final   Klebsiella pneumoniae NOT DETECTED NOT DETECTED Final   Proteus species NOT DETECTED NOT DETECTED Final   Salmonella species NOT DETECTED NOT DETECTED Final   Serratia marcescens NOT DETECTED NOT DETECTED Final   Haemophilus influenzae NOT DETECTED NOT DETECTED Final   Neisseria meningitidis NOT DETECTED NOT DETECTED Final   Pseudomonas aeruginosa NOT DETECTED NOT DETECTED Final   Stenotrophomonas maltophilia NOT DETECTED NOT DETECTED Final    Candida albicans NOT DETECTED NOT DETECTED Final   Candida auris NOT DETECTED NOT DETECTED Final   Candida glabrata NOT DETECTED NOT DETECTED Final   Candida krusei NOT DETECTED NOT DETECTED Final   Candida parapsilosis NOT DETECTED NOT DETECTED Final   Candida tropicalis NOT DETECTED NOT DETECTED Final   Cryptococcus neoformans/gattii NOT DETECTED NOT DETECTED Final   Meth resistant mecA/C and MREJ NOT DETECTED NOT DETECTED Final    Comment: Performed at [facility], [place] [address], [place], [place] [address]     Medications:    vitamin C  500 mg Oral Daily   benzonatate  100 mg Oral TID   Chlorhexidine Gluconate Cloth  6 each Topical Daily   enoxaparin (LOVENOX) injection  30 mg Subcutaneous QHS   feeding supplement  237 mL Oral TID BM   guaiFENesin  1,200 mg Oral BID   mouth rinse  15 mL Mouth Rinse BID   metoprolol tartrate  12.5 mg Oral BID   multivitamin with minerals  1 tablet Oral Daily   pantoprazole  40 mg Oral BID   zinc sulfate  220 mg Oral Daily   Continuous Infusions:  sodium chloride 75 mL/hr at 07/24/21 2100   sodium chloride 500 mL (07/11/21 1647)    ceFAZolin (ANCEF) IV Stopped (07/25/21 2216)      LOS: 31 days   Charlynne Cousins  [facility]  07/26/2021, 6:49 AM

## 2021-07-27 ENCOUNTER — Other Ambulatory Visit (HOSPITAL_COMMUNITY): Payer: Self-pay

## 2021-07-27 LAB — CULTURE, BLOOD (ROUTINE X 2)
Special Requests: ADEQUATE
Special Requests: ADEQUATE

## 2021-07-27 LAB — BASIC METABOLIC PANEL
Anion gap: 7 (ref 5–15)
BUN: 24 mg/dL — ABNORMAL HIGH (ref 8–23)
CO2: 25 mmol/L (ref 22–32)
Calcium: 8.3 mg/dL — ABNORMAL LOW (ref 8.9–10.3)
Chloride: 102 mmol/L (ref 98–111)
Creatinine, Ser: 0.93 mg/dL (ref 0.44–1.00)
GFR, Estimated: 59 mL/min — ABNORMAL LOW (ref >60)
Glucose, Bld: 94 mg/dL (ref 70–99)
Potassium: 4.6 mmol/L (ref 3.5–5.1)
Sodium: 134 mmol/L — ABNORMAL LOW (ref 135–145)

## 2021-07-27 MED ORDER — CEFAZOLIN SODIUM-DEXTROSE 2-4 GM/100ML-% IV SOLN
2.0000 g | Freq: Three times a day (TID) | INTRAVENOUS | Status: DC
  Administered 2021-07-27 – 2021-07-28 (×3): 2 g via INTRAVENOUS
  Filled 2021-07-27 (×4): qty 100

## 2021-07-27 MED ORDER — ENOXAPARIN SODIUM 40 MG/0.4ML IJ SOSY
40.0000 mg | PREFILLED_SYRINGE | Freq: Every day | INTRAMUSCULAR | Status: DC
  Administered 2021-07-27 – 2021-08-02 (×7): 40 mg via SUBCUTANEOUS
  Filled 2021-07-27 (×7): qty 0.4

## 2021-07-27 NOTE — Progress Notes (Addendum)
Nutrition Follow-up  DOCUMENTATION CODES:   Not applicable  INTERVENTION:   - Continue Ensure Enlive po TID, each supplement provides 350 kcal and 20 grams of protein  - Continue MVI with minerals daily  NUTRITION DIAGNOSIS:   Inadequate oral intake related to inability to eat as evidenced by NPO status.  Diet advanced   GOAL:   Patient will meet greater than or equal to 90% of their needs  Intake declined slighty  MONITOR:   PO intake, Supplement acceptance, Labs, Weight trends, I & O's  REASON FOR ASSESSMENT:   Ventilator    ASSESSMENT:   87 y.o. female who is a former smoker and has medical history of HTN, dyslipidemia, GERD, Morton's neuroma, idiopathic scoliosis, mitral valve prolapse, anxiety, psychosis, and diverticulitis. She presented to the ED via EMS d/t acute onset of hemoptysis. In the ED she was initially hemodynamically stable but then experienced sudden episode of O2 desaturation into the 80s and became altered with confusion. She was subsequently intubated in the ED.  7/25 - extubated 7/26 - chest tube placed for L pneumothorax, clear liquids, transferred to [facility] 7/27 - diet advanced to regular consistency  8/08 - COVID+   Developed fever on 8/22, diagnosed with MSSA bacteremia from unknown source.   Appetite declined slightly over the last five days. Meal completions charted as 20-50%. Patient continues to take Ensure Enlive TID. Continue current interventions, if intake continues to decline consider nutrition support.   Admit weight: 73.5 kg Current weight: 78.9 kg   Medications: 500 mg vitamin C, zinc sulfate 220 mg  Labs: Na 134 (L)   Diet Order:   Diet Order             Diet regular Room service appropriate? Yes with Assist; Fluid consistency: Thin  Diet effective now                   EDUCATION NEEDS:   Education needs have been addressed  Skin:  Skin Assessment: Reviewed RN Assessment  Last BM:  8/21  Height:   Ht  Readings from Last 1 Encounters:  06/25/21 '5\' 2"'$  (1.575 m)    Weight:   Wt Readings from Last 1 Encounters:  07/27/21 78.9 kg    Ideal Body Weight:  50 kg  BMI:  Body mass index is 31.82 kg/m.  Estimated Nutritional Needs:   Kcal:  1400-1600  Protein:  70-85 grams  Fluid:  >/= 1.5 L/day   Mariana Single MS, RD, LDN, CNSC Clinical Nutrition Pager listed in Ages

## 2021-07-27 NOTE — Progress Notes (Signed)
Occupational Therapy Treatment Patient Details Name: Mary Strickland MRN: [id number] DOB: [birth year] Today's Date: 07/27/2021    History of present illness 87 y.o. female presents to [place] [facility] ED on 06/24/2021 with sudden onset hemoptysis. Pt experienced a bout of hypoxia in ED with AMS and was intubated on 7/23. Pt was transported to [facility] hospital on 7/24, found to have pneumomediastinum, pneumothorax. Chest tube inserted 7/24. Pt extubated on 7/25. Now covid+ as of 07/10/21. PMH includes history of smoking, hypertension, mitral valve prolapse.   OT comments  Pt. Was cooperative with therapy and was agreeable to oob. Pt. Required assist with transfers and cues for proper hand placement. Pt. Sat in recliner for adls. Pt. To be followed by acute ot.   Follow Up Recommendations  SNF    Equipment Recommendations       Recommendations for Other Services      Precautions / Restrictions Precautions Precautions: Fall Precaution Comments: Airborne/Contact precs (Covid+ 8/8, then reinstated 8/22 due to new fever) Restrictions Weight Bearing Restrictions: No       Mobility Bed Mobility Overal bed mobility: Needs Assistance Bed Mobility: Sit to Supine     Supine to sit: Supervision          Transfers Overall transfer level: Needs assistance Equipment used: Rolling walker (2 wheeled) Transfers: Sit to/from Stand Sit to Stand: Min assist Stand pivot transfers: Min assist       General transfer comment: cues for proer hand placement.    Balance     Sitting balance-[name] Scale: Good       Standing balance-[name] Scale: Fair                             ADL either performed or assessed with clinical judgement   ADL Overall ADL's : Needs assistance/impaired Eating/Feeding: Set up;Sitting   Grooming: Min guard;Standing;Wash/dry face;Oral care;Brushing hair   Upper Body Bathing: Set up;Sitting;Supervision/ safety   Lower Body Bathing: Moderate  assistance;Sit to/from stand   Upper Body Dressing : Set up;Sitting   Lower Body Dressing: Moderate assistance;Sit to/from stand   Toilet Transfer: Minimal assistance;Ambulation;BSC           Functional mobility during ADLs: Minimal assistance;Rolling walker General ADL Comments: Pt with noted increasing fatigue with mobility to/from bathroom, requiring standing rest breaks and increased time. Continues to require supplemental O2 to maintain sats     Vision   Vision Assessment?: No apparent visual deficits   Perception     Praxis      Cognition Arousal/Alertness: Awake/alert Behavior During Therapy: Flat affect Overall Cognitive Status: Impaired/Different from baseline Area of Impairment: Problem solving;Awareness                             Problem Solving: Slow processing;Difficulty sequencing;Requires verbal cues General Comments: hard to understand pt. secondary to low volume. pt is hoh.        Exercises     Shoulder Instructions       General Comments      Pertinent Vitals/ Pain       Pain Assessment: No/denies pain  Home Living                                          Prior Functioning/Environment  Frequency  Min 2X/week        Progress Toward Goals  OT Goals(current goals can now be found in the care plan section)  Progress towards OT goals: Progressing toward goals  Acute Rehab OT Goals Patient Stated Goal: go home OT Goal Formulation: With patient Time For Goal Achievement: 07/31/21 Potential to Achieve Goals: Good ADL Goals Pt Will Perform Grooming: with set-up;standing Pt Will Perform Lower Body Bathing: with set-up;sit to/from stand Pt Will Perform Lower Body Dressing: with set-up;sit to/from stand Pt Will Transfer to Toilet: with supervision;ambulating Pt Will Perform Toileting - Clothing Manipulation and hygiene: with set-up;sit to/from stand;sitting/lateral leans Additional ADL Goal  #1: Pt will tolerate OOB functional activity for at least 5 min to maximize ADL/mobility endurance  Plan Discharge plan remains appropriate    Co-evaluation                 AM-PAC OT "6 Clicks" Daily Activity     Outcome Measure   Help from another person eating meals?: None Help from another person taking care of personal grooming?: A Little Help from another person toileting, which includes using toliet, bedpan, or urinal?: A Little Help from another person bathing (including washing, rinsing, drying)?: A Lot Help from another person to put on and taking off regular upper body clothing?: A Little Help from another person to put on and taking off regular lower body clothing?: A Lot 6 Click Score: 17    End of Session Equipment Utilized During Treatment: Rolling walker;Oxygen  OT Visit Diagnosis: Unsteadiness on feet (R26.81);Muscle weakness (generalized) (M62.81);Pain   Activity Tolerance Patient tolerated treatment well   Patient Left in chair;with call bell/phone within reach;with chair alarm set   Nurse Communication  (ok therapy)        Time: [id number] OT Time Calculation (min): 42 min  Charges: OT General Charges $OT Visit: 1 Visit OT Treatments $Self Care/Home Management : 23-37 mins $Therapeutic Activity: 8-22 mins  Reece Packer OT/L    Daysy Santini 07/27/2021, 1:09 PM

## 2021-07-27 NOTE — Progress Notes (Signed)
[facility] for Infectious Disease  Date of Admission:  06/24/2021     Total days of antibiotics 16         ASSESSMENT:  Mary Strickland blood cultures from 07/25/21 have remained without growth in 2 days and clinically appears to be improving. Continue to monitor cultures for clearance of bacteremia. Plan of care to continue cefazolin with consideration for continued cefazolin versus change to Zyvox for 2 weeks at discharge. Pharmacy completed PA with cost of $100 for course of Zyvox. Continue pulmonary hygiene. Final recommendations pending blood culture clearance. Continue supportive care as needed by primary team.   PLAN:  Continue cefazolin. Monitor blood cultures for clearance of bacteremia. Consider 2 weeks of Cefazolin versus 2 weeks of Zyvox Supportive care as needed per primary team.  Continue to follow.   Principal Problem:   MSSA bacteremia Active Problems:   Hypertension   Hemoptysis   Acute hypoxemic respiratory failure (HCC)   Abnormal CXR   Leukocytosis   Subcutaneous emphysema (HCC)   Pneumoperitoneum of unknown etiology   Pneumothorax on left   COVID    vitamin C  500 mg Oral Daily   benzonatate  100 mg Oral TID   Chlorhexidine Gluconate Cloth  6 each Topical Daily   enoxaparin (LOVENOX) injection  40 mg Subcutaneous QHS   feeding supplement  237 mL Oral TID BM   guaiFENesin  1,200 mg Oral BID   mouth rinse  15 mL Mouth Rinse BID   metoprolol tartrate  12.5 mg Oral BID   multivitamin with minerals  1 tablet Oral Daily   pantoprazole  40 mg Oral BID   zinc sulfate  220 mg Oral Daily    SUBJECTIVE:  Elevated temperature of 99.8. Feeling okay with no new concerns/complaints.   Allergies  Allergen Reactions   Sulfamethoxazole-Trimethoprim Shortness Of Breath and Itching   Percocet [Oxycodone-Acetaminophen] Other (See Comments)    Unknown reaction   Zocor [Simvastatin] Other (See Comments)    Unknown reaction     Review of Systems: Review of  Systems  Constitutional:  Negative for chills, fever and weight loss.  Respiratory:  Positive for cough and sputum production. Negative for shortness of breath and wheezing.   Cardiovascular:  Negative for chest pain and leg swelling.  Gastrointestinal:  Negative for abdominal pain, constipation, diarrhea, nausea and vomiting.  Skin:  Negative for rash.     OBJECTIVE: Vitals:   07/26/21 2003 07/26/21 2353 07/27/21 0333 07/27/21 0855  BP: (!) 177/58 130/65 140/71 (!) 152/77  Pulse:  92  (!) 110  Resp: '19 16 17 20  '$ Temp: 98.2 F (36.8 C) 98.2 F (36.8 C) 97.8 F (36.6 C) (P) 99.8 F (37.7 C)  TempSrc: Oral Oral Axillary Oral  SpO2: 91% 100% 100% 96%  Weight:   78.9 kg   Height:       Body mass index is 31.82 kg/m.  Physical Exam Constitutional:      General: She is not in acute distress.    Appearance: She is well-developed.     Comments: Lying in bed with head of bed elevated; pleasant.   Cardiovascular:     Rate and Rhythm: Normal rate and regular rhythm.     Heart sounds: Normal heart sounds.  Pulmonary:     Effort: Pulmonary effort is normal.     Breath sounds: Rhonchi present.  Skin:    General: Skin is warm and dry.  Neurological:     Mental Status: She  is alert and oriented to person, place, and time.  Psychiatric:        Behavior: Behavior normal.        Thought Content: Thought content normal.        Judgment: Judgment normal.    Lab Results Lab Results  Component Value Date   WBC 12.0 (H) 07/25/2021   HGB 10.1 (L) 07/25/2021   HCT 31.3 (L) 07/25/2021   MCV 92.9 07/25/2021   PLT 210 07/25/2021    Lab Results  Component Value Date   CREATININE 0.93 07/27/2021   BUN 24 (H) 07/27/2021   NA 134 (L) 07/27/2021   K 4.6 07/27/2021   CL 102 07/27/2021   CO2 25 07/27/2021    Lab Results  Component Value Date   ALT 18 07/24/2021   AST 22 07/24/2021   ALKPHOS 41 07/24/2021   BILITOT 1.7 (H) 07/24/2021     Microbiology: Recent Results (from the  past 240 hour(s))  Culture, blood (routine x 2)     Status: Abnormal   Collection Time: 07/24/21 10:39 AM   Specimen: BLOOD  Result Value Ref Range Status   Specimen Description BLOOD LEFT ANTECUBITAL  Final   Special Requests   Final    BOTTLES DRAWN AEROBIC AND ANAEROBIC Blood Culture adequate volume   Culture  Setup Time   Final    GRAM POSITIVE COCCI IN CLUSTERS IN BOTH AEROBIC AND ANAEROBIC BOTTLES    Culture (A)  Final    STAPHYLOCOCCUS AUREUS SUSCEPTIBILITIES PERFORMED ON PREVIOUS CULTURE WITHIN THE LAST 5 DAYS. Performed at [facility], [place] [address] [place], [place] [address]    Report Status 07/27/2021 FINAL  Final  Culture, blood (routine x 2)     Status: Abnormal   Collection Time: 07/24/21 10:48 AM   Specimen: BLOOD  Result Value Ref Range Status   Specimen Description BLOOD RIGHT ANTECUBITAL  Final   Special Requests   Final    BOTTLES DRAWN AEROBIC AND ANAEROBIC Blood Culture adequate volume   Culture  Setup Time   Final    GRAM POSITIVE COCCI IN CLUSTERS IN BOTH AEROBIC AND ANAEROBIC BOTTLES CRITICAL RESULT CALLED TO, READ BACK BY AND VERIFIED WITH: L SEAY,PHARMD'@0629'$  07/25/21 [place] Performed at [facility], [place] [address], [place], [place] [address]    Culture STAPHYLOCOCCUS AUREUS (A)  Final   Report Status 07/27/2021 FINAL  Final   Organism ID, Bacteria STAPHYLOCOCCUS AUREUS  Final      Susceptibility   Staphylococcus aureus - MIC*    CIPROFLOXACIN <=0.5 SENSITIVE Sensitive     ERYTHROMYCIN <=0.25 SENSITIVE Sensitive     GENTAMICIN <=0.5 SENSITIVE Sensitive     OXACILLIN 0.5 SENSITIVE Sensitive     TETRACYCLINE <=1 SENSITIVE Sensitive     VANCOMYCIN 1 SENSITIVE Sensitive     TRIMETH/SULFA <=10 SENSITIVE Sensitive     CLINDAMYCIN <=0.25 SENSITIVE Sensitive     RIFAMPIN <=0.5 SENSITIVE Sensitive     Inducible Clindamycin NEGATIVE Sensitive     * STAPHYLOCOCCUS AUREUS  Blood Culture ID Panel (Reflexed)     Status: Abnormal   Collection  Time: 07/24/21 10:48 AM  Result Value Ref Range Status   Enterococcus faecalis NOT DETECTED NOT DETECTED Final   Enterococcus Faecium NOT DETECTED NOT DETECTED Final   Listeria monocytogenes NOT DETECTED NOT DETECTED Final   Staphylococcus species DETECTED (A) NOT DETECTED Final    Comment: CRITICAL RESULT CALLED TO, READ BACK BY AND VERIFIED WITH: L SEAY,PHARMD'@0630'$  07/25/21  [place]    Staphylococcus aureus (BCID) DETECTED (A) NOT DETECTED Final    Comment: CRITICAL RESULT CALLED TO, READ BACK BY AND VERIFIED WITH: L SEAY,PHARMD'@0630'$  07/25/21 [place]    Staphylococcus epidermidis NOT DETECTED NOT DETECTED Final   Staphylococcus lugdunensis NOT DETECTED NOT DETECTED Final   Streptococcus species NOT DETECTED NOT DETECTED Final   Streptococcus agalactiae NOT DETECTED NOT DETECTED Final   Streptococcus pneumoniae NOT DETECTED NOT DETECTED Final   Streptococcus pyogenes NOT DETECTED NOT DETECTED Final   A.calcoaceticus-baumannii NOT DETECTED NOT DETECTED Final   Bacteroides fragilis NOT DETECTED NOT DETECTED Final   Enterobacterales NOT DETECTED NOT DETECTED Final   Enterobacter cloacae complex NOT DETECTED NOT DETECTED Final   Escherichia coli NOT DETECTED NOT DETECTED Final   Klebsiella aerogenes NOT DETECTED NOT DETECTED Final   Klebsiella oxytoca NOT DETECTED NOT DETECTED Final   Klebsiella pneumoniae NOT DETECTED NOT DETECTED Final   Proteus species NOT DETECTED NOT DETECTED Final   Salmonella species NOT DETECTED NOT DETECTED Final   Serratia marcescens NOT DETECTED NOT DETECTED Final   Haemophilus influenzae NOT DETECTED NOT DETECTED Final   Neisseria meningitidis NOT DETECTED NOT DETECTED Final   Pseudomonas aeruginosa NOT DETECTED NOT DETECTED Final   Stenotrophomonas maltophilia NOT DETECTED NOT DETECTED Final   Candida albicans NOT DETECTED NOT DETECTED Final   Candida auris NOT DETECTED NOT DETECTED Final   Candida glabrata NOT DETECTED NOT DETECTED Final   Candida krusei NOT  DETECTED NOT DETECTED Final   Candida parapsilosis NOT DETECTED NOT DETECTED Final   Candida tropicalis NOT DETECTED NOT DETECTED Final   Cryptococcus neoformans/gattii NOT DETECTED NOT DETECTED Final   Meth resistant mecA/C and MREJ NOT DETECTED NOT DETECTED Final    Comment: Performed at [facility], [address], [place], [place] [address]  Culture, blood (routine x 2)     Status: None (Preliminary result)   Collection Time: 07/25/21  8:06 AM   Specimen: BLOOD  Result Value Ref Range Status   Specimen Description BLOOD LEFT ANTECUBITAL  Final   Special Requests   Final    BOTTLES DRAWN AEROBIC AND ANAEROBIC Blood Culture adequate volume   Culture   Final    NO GROWTH 2 DAYS Performed at [facility], [place] [address] [place], [place] [address]    Report Status PENDING  Incomplete  Culture, blood (routine x 2)     Status: None (Preliminary result)   Collection Time: 07/25/21  8:06 AM   Specimen: BLOOD  Result Value Ref Range Status   Specimen Description BLOOD LEFT ANTECUBITAL  Final   Special Requests   Final    BOTTLES DRAWN AEROBIC AND ANAEROBIC Blood Culture adequate volume   Culture   Final    NO GROWTH 2 DAYS Performed at [facility], [place] [address] [place], [place] [address]    Report Status PENDING  Incomplete     Terri Piedra, NP [place] [facility]  07/27/2021  11:44 AM

## 2021-07-27 NOTE — Progress Notes (Signed)
Physical Therapy Treatment Patient Details Name: Mary Strickland MRN: [id number] DOB: 07-20-1930 Today's Date: 07/27/2021    History of Present Illness 87 y.o. female presents to [place] [facility] ED on 06/24/2021 with sudden onset hemoptysis. Pt experienced a bout of hypoxia in ED with AMS and was intubated on 7/23. Pt was transported to [facility] hospital on 7/24, found to have pneumomediastinum, pneumothorax. Chest tube inserted 7/24. Pt extubated on 7/25. Now covid+ as of 07/10/21. PMH includes history of smoking, hypertension, mitral valve prolapse.    PT Comments    Pt received in supine, agreeable to therapy session and with good participation and fair tolerance for mobility. Pt limited due to dyspnea on exertion and SpO2 desat to 84% with exertion on 3L O2 [place]. Pt able to perform gait trials ~65f and 543fbut reporting severe fatigue (8/10 modified RPE) after this amount of activity. Pt remains a high fall risk due to deconditioning/slow gait speed and unstable vitals (SpO2), continue to recommend SNF. RN and unFinancial controllerotified pt will need to be checked on for assist back to bed ~7pm as she is hoarse from coughing and cannot tell them she needs help to get back to bed. Pt continues to benefit from PT services to progress toward functional mobility goals.   Follow Up Recommendations  SNF;Supervision/Assistance - 24 hour     Equipment Recommendations  Rolling walker with 5" wheels;3in1 (PT)    Recommendations for Other Services       Precautions / Restrictions Precautions Precautions: Fall Precaution Comments: Airborne/Contact precs (Covid+ 8/8, then reinstated 8/22 due to new fever) Restrictions Weight Bearing Restrictions: No    Mobility  Bed Mobility Overal bed mobility: Needs Assistance Bed Mobility: Rolling;Sidelying to Sit Rolling: Min guard Sidelying to sit: Min assist       General bed mobility comments: from flat HOB, use of rails    Transfers Overall transfer level:  Needs assistance Equipment used: Rolling walker (2 wheeled) Transfers: Sit to/from Stand Sit to Stand: Min assist;Mod assist         General transfer comment: cues for proper hand placement, pt with decreased safety and needs modA lift assist initially to stand, unable to stand with min guard on first attempt from bed. minA to stand from toilet seat with wall rail and RW support and lift assist.  Ambulation/Gait Ambulation/Gait assistance: Min assist Gait Distance (Feet): 25 Feet (2538fo toilet, then 5ft73fck to chair, pt too fatigued to walk back to chair after toileting due to fatigue/desat) Assistive device: Rolling walker (2 wheeled) Gait Pattern/deviations: Step-through pattern;Decreased step length - right;Decreased step length - left;Decreased stride length Gait velocity: grossly <0.3 m/s   General Gait Details: Patient with poor ambulation tolerance and safety this session. Pt desat to 84% on 3L O2 [employer] wtih exertion, very dyspneic, needs >2 minutes seated break and 3L O2 [place] to return to >90%. Continues to be limited by fatigue and increased work of breathing.         Balance Overall balance assessment: Needs assistance Sitting-balance support: Feet supported Sitting balance-[name] Scale: Fair     Standing balance support: Bilateral upper extremity supported;During functional activity Standing balance-[name] Scale: Poor Standing balance comment: dyspneic and flexed trunk posture at RW, needs min guard to minA for static standing at RW  Darden Restaurantsusal/Alertness: Awake/alert Behavior During Therapy: Anxious Overall Cognitive Status: Impaired/Different from baseline Area of Impairment: Problem solving;Awareness;Memory;Attention;Safety/judgement  Current Attention Level: Sustained Memory: Decreased recall of precautions;Decreased short-term memory   Safety/Judgement: Decreased awareness of safety;Decreased awareness of deficits Awareness:  Intellectual Problem Solving: Slow processing;Difficulty sequencing;Requires verbal cues General Comments: hard to understand pt. secondary to low volume. pt is hoh. pt anxious regarding mobility but agreeable with encouragement. secretary notified pt without a voice so when she calls for help she won't be able to tell them to come help her back to bed and RN will need to check on her.         General Comments General comments (skin integrity, edema, etc.): pt desat on 3L with exertion and very dyspneic this date. unable to walk >60f without severe reported fatigue (8/10 modified RPE).      Pertinent Vitals/Pain Pain Assessment: No/denies pain     PT Goals (current goals can now be found in the care plan section) Acute Rehab PT Goals Patient Stated Goal: go home PT Goal Formulation: With patient Time For Goal Achievement: 08/02/21 Progress towards PT goals: Progressing toward goals    Frequency    Min 2X/week      PT Plan Current plan remains appropriate       AM-PAC PT "6 Clicks" Mobility   Outcome Measure  Help needed turning from your back to your side while in a flat bed without using bedrails?: A Little Help needed moving from lying on your back to sitting on the side of a flat bed without using bedrails?: A Little Help needed moving to and from a bed to a chair (including a wheelchair)?: A Little Help needed standing up from a chair using your arms (e.g., wheelchair or bedside chair)?: A Little Help needed to walk in hospital room?: A Little Help needed climbing 3-5 steps with a railing? : A Lot 6 Click Score: 17    End of Session Equipment Utilized During Treatment: Gait belt;Oxygen Activity Tolerance: Patient limited by fatigue;Other (comment);Treatment limited secondary to medical complications (Comment) (dyspnea, desat with exertion) Patient left: in chair;with call bell/phone within reach;with chair alarm set (RN notified she needs to sit up an hour to  eat) Nurse Communication: Mobility status;Other (comment) (pt needs to be checked on ~7pm to get back to bed she is a bit confused may not call out) PT Visit Diagnosis: Muscle weakness (generalized) (M62.81);Other abnormalities of gait and mobility (R26.89);Difficulty in walking, not elsewhere classified (R26.2)     Time: [id number] Time Calculation (min) (ACUTE ONLY): 42 min  Charges:  $Gait Training: 23-37 mins $Therapeutic Activity: 8-22 mins                     Boni Maclellan P., PTA Acute Rehabilitation Services Pager: [phone]: 3[place]8/25/2022, 6:54 PM

## 2021-07-27 NOTE — Progress Notes (Signed)
[facility] PROGRESS NOTE    Progress Note  Nikhila Stoner  [id number] DOB: [birth year] DOA: 06/24/2021 PCP: Burnard Bunting, MD     Brief Narrative:   Mary Strickland is an 87 y.o. female past medical history of tobacco abuse, essential hypertension came in complaining of hemoptysis severe hypoxia requiring endotracheal intubation.  Was found to have a pneumomediastinum with a more thorax with chest tube placement.  Her hemoptysis was treated conservatively patient was initially transferred to comfort measures with a chest tube removed.  During her admission she was also treated for pneumonia thought to be COVID-19, developed acute urinary retention.  Prior to second attempt to discharge patient developed fevers BC ID was significant for staph aureus MSSA.  Assessment/Plan:   Acute respiratory failure with hypoxia likely secondary to COVID-19 pneumonia/multifocal pneumonia: Treated empirically with antibiotics for which she completed her course in-house. She also completed her course of remdesivir and Decadron. Out of bed to chair and weaned to room air. Physical therapy recommended skilled nursing facility.  Awaiting social worker offers  Left pneumothorax/pneumomediastinum/hemoptysis: Aspirin discontinued she required chest tube from 06/26/2021 to 06/29/2021 no further hemoptysis or intervention.  MSSA bacteremia: Unknown source patient did have lower respiratory tract infection required intubation. Sputum cultures negative till date Blood cultures grew staph aureus speciation and sensitivities are pending., surveillance blood cultures from the 23rd are negative till date, ID recommended to continue IV cefazolin.  Acute urinary retention: Foley was placed on 07/10/2021 discontinued on 07/19/2021 now resolved. Continue strict I's and O's.  Elevated troponins likely due to demand ischemia: Cardiology was consulted TEE showed no wall motion abnormalities preserved EF,  cardiology signed off on 06/30/2021 secondary to decision of patient transitioning to comfort measures which is not the case at this point in time. Will need an outpatient follow-up with cardiology.  Malpositioned left IJ: CT neck without focal vascular injuries now discontinued.  Essential hypertension: Continue current antihypertensive medication blood pressure seems to be well controlled.   DVT prophylaxis: lovenox Family Communication:none Status is: Inpatient  Remains inpatient appropriate because:Hemodynamically unstable  Dispo: The patient is from: Home              Anticipated d/c is to: Home              Patient currently is not medically stable to d/c.   Difficult to place patient No        Code Status:     Code Status Orders  (From admission, onward)           Start     Ordered   06/25/21 0354  Do not attempt resuscitation (DNR)  Continuous       Question Answer Comment  In the event of cardiac or respiratory ARREST Do not call a "code blue"   In the event of cardiac or respiratory ARREST Do not perform Intubation, CPR, defibrillation or ACLS   In the event of cardiac or respiratory ARREST Use medication by any route, position, wound care, and other measures to relive pain and suffering. May use oxygen, suction and manual treatment of airway obstruction as needed for comfort.      06/25/21 0353           Code Status History     Date Active Date Inactive Code Status Order ID Comments User Context   06/25/2021 0314 06/25/2021 0353 Full Code [id number]  Renee Pain, MD ED         IV Access:  Peripheral IV   Procedures and diagnostic studies:   ECHOCARDIOGRAM LIMITED  Result Date: 07/25/2021    ECHOCARDIOGRAM LIMITED REPORT   Patient Name:   [facility], [place] Peale Date of Exam: 07/25/2021 Medical Rec #:  [id number]           Height:       62.0 in Accession #:    [id number]          Weight:       165.0 lb Date of Birth:  Mar 07, 1930            BSA:          1.762 m Patient Age:    74 years            BP:           132/65 mmHg Patient Gender: F                   HR:           95 bpm. Exam Location:  Inpatient Procedure: Limited Echo, Limited Color Doppler and Color Doppler Indications:    Bacteremia  History:        Patient has prior history of Echocardiogram examinations. Risk                 Factors:Hypertension, Dyslipidemia and Former Smoker. Covid 19                 positive.  Sonographer:    Merrie Roof RDCS Referring Phys: [place]  1. Left ventricular ejection fraction, by estimation, is 60 to 65%. The left ventricle has normal function. There is mild left ventricular hypertrophy.  2. Right ventricular systolic function is normal. The right ventricular size is normal. There is normal pulmonary artery systolic pressure.  3. The mitral valve is degenerative. Mild mitral valve regurgitation.  4. The aortic valve is abnormal. There is mild calcification of the aortic valve. There is moderate thickening of the aortic valve. Aortic valve regurgitation is trivial.  5. The inferior vena cava is normal in size with greater than 50% respiratory variability, suggesting right atrial pressure of 3 mmHg. Conclusion(s)/Recommendation(s): No evidence of valvular vegetations on this transthoracic echocardiogram. FINDINGS  Left Ventricle: Left ventricular ejection fraction, by estimation, is 60 to 65%. The left ventricle has normal function. There is mild left ventricular hypertrophy. Right Ventricle: The right ventricular size is normal. No increase in right ventricular wall thickness. Right ventricular systolic function is normal. There is normal pulmonary artery systolic pressure. The tricuspid regurgitant velocity is 2.85 m/s, and  with an assumed right atrial pressure of 3 mmHg, the estimated right ventricular systolic pressure is [id number] mmHg. Mitral Valve: The mitral valve is degenerative in appearance. Mild to moderate mitral annular  calcification. Mild mitral valve regurgitation. Tricuspid Valve: The tricuspid valve is grossly normal. Tricuspid valve regurgitation is mild. Aortic Valve: The aortic valve is abnormal. There is mild calcification of the aortic valve. There is moderate thickening of the aortic valve. Aortic valve regurgitation is trivial. Pulmonic Valve: The pulmonic valve was thickened with good excursion. Pulmonic valve regurgitation is mild. Aorta: The aortic root is normal in size and structure. Venous: The inferior vena cava is normal in size with greater than 50% respiratory variability, suggesting right atrial pressure of 3 mmHg. LEFT VENTRICLE PLAX 2D LVIDd:         3.60 cm LVIDs:         2.20 cm LV PW:  1.00 cm LV IVS:        1.20 cm  LEFT ATRIUM         Index LA diam:    2.80 cm 1.59 cm/m   AORTA Ao Root diam: 3.20 cm TRICUSPID VALVE TR Peak grad:   32.5 mmHg TR Vmax:        285.00 cm/s Cherlynn Kaiser MD Electronically signed by Cherlynn Kaiser MD Signature Date/Time: 07/25/2021/4:56:35 PM    Final      Medical Consultants:   None.   Subjective:    Nyeema Daley continues to have cough otherwise feels great.  Objective:    Vitals:   07/26/21 1712 07/26/21 2003 07/26/21 2353 07/27/21 0333  BP: (!) 151/70 (!) 177/58 130/65 140/71  Pulse: 93  92   Resp: '20 19 16 17  '$ Temp: 98.2 F (36.8 C) 98.2 F (36.8 C) 98.2 F (36.8 C) 97.8 F (36.6 C)  TempSrc: Oral Oral Oral Axillary  SpO2: 98% 91% 100% 100%  Weight:    78.9 kg  Height:       SpO2: 100 % O2 Flow Rate (L/min): 3 L/min FiO2 (%): 40 %   Intake/Output Summary (Last 24 hours) at 07/27/2021 0735 Last data filed at 07/26/2021 2019 Gross per 24 hour  Intake 1142.03 ml  Output 1000 ml  Net 142.03 ml    Filed Weights   07/25/21 0442 07/26/21 0431 07/27/21 0333  Weight: 74.8 kg 78.9 kg 78.9 kg    Exam: General exam: In no acute distress. Respiratory system: Good air movement and clear to auscultation. Cardiovascular  system: S1 & S2 heard, RRR. No JVD. Gastrointestinal system: Abdomen is nondistended, soft and nontender.  Extremities: No pedal edema. Skin: No rashes, lesions or ulcers  Data Reviewed:    Labs: Basic Metabolic Panel: Recent Labs  Lab 07/24/21 1109 07/25/21 0807 07/27/21 0148  NA 131* 133* 134*  K 4.1 4.3 4.6  CL 96* 98 102  CO2 '23 26 25  '$ GLUCOSE 96 94 94  BUN 43* 47* 24*  CREATININE 1.25* 1.46* 0.93  CALCIUM 8.6* 8.3* 8.3*    GFR Estimated Creatinine Clearance: 40.7 mL/min (by C-G formula based on SCr of 0.93 mg/dL). Liver Function Tests: Recent Labs  Lab 07/24/21 1109  AST 22  ALT 18  ALKPHOS 41  BILITOT 1.7*  PROT 6.8  ALBUMIN 2.6*    No results for input(s): LIPASE, AMYLASE in the last 168 hours. No results for input(s): AMMONIA in the last 168 hours. Coagulation profile No results for input(s): INR, PROTIME in the last 168 hours. COVID-19 Labs  No results for input(s): DDIMER, FERRITIN, LDH, CRP in the last 72 hours.  Lab Results  Component Value Date   SARSCOV2NAA POSITIVE (A) 07/10/2021   [place] NEGATIVE 06/24/2021    CBC: Recent Labs  Lab 07/24/21 1109 07/25/21 0100  WBC 11.9* 12.0*  HGB 10.5* 10.1*  HCT 32.8* 31.3*  MCV 93.2 92.9  PLT 206 210    Cardiac Enzymes: No results for input(s): CKTOTAL, CKMB, CKMBINDEX, TROPONINI in the last 168 hours. BNP (last 3 results) No results for input(s): PROBNP in the last 8760 hours. CBG: No results for input(s): GLUCAP in the last 168 hours. D-Dimer: No results for input(s): DDIMER in the last 72 hours. Hgb A1c: No results for input(s): HGBA1C in the last 72 hours. Lipid Profile: No results for input(s): CHOL, HDL, LDLCALC, TRIG, CHOLHDL, LDLDIRECT in the last 72 hours. Thyroid function studies: No results for input(s): TSH, T4TOTAL,  T3FREE, THYROIDAB in the last 72 hours.  Invalid input(s): FREET3 Anemia work up: No results for input(s): VITAMINB12, FOLATE, FERRITIN, TIBC, IRON,  RETICCTPCT in the last 72 hours. Sepsis Labs: Recent Labs  Lab 07/24/21 1109 07/25/21 0100 07/26/21 0333  PROCALCITON 0.58 2.09 2.00  WBC 11.9* 12.0*  --     Microbiology Recent Results (from the past 240 hour(s))  Culture, blood (routine x 2)     Status: Abnormal (Preliminary result)   Collection Time: 07/24/21 10:39 AM   Specimen: BLOOD  Result Value Ref Range Status   Specimen Description BLOOD LEFT ANTECUBITAL  Final   Special Requests   Final    BOTTLES DRAWN AEROBIC AND ANAEROBIC Blood Culture adequate volume   Culture  Setup Time   Final    GRAM POSITIVE COCCI IN CLUSTERS IN BOTH AEROBIC AND ANAEROBIC BOTTLES Performed at [facility], [place] [address] [place], [place] [address]    Culture STAPHYLOCOCCUS AUREUS (A)  Final   Report Status PENDING  Incomplete  Culture, blood (routine x 2)     Status: None (Preliminary result)   Collection Time: 07/24/21 10:48 AM   Specimen: BLOOD  Result Value Ref Range Status   Specimen Description BLOOD RIGHT ANTECUBITAL  Final   Special Requests   Final    BOTTLES DRAWN AEROBIC AND ANAEROBIC Blood Culture adequate volume   Culture  Setup Time   Final    GRAM POSITIVE COCCI IN CLUSTERS IN BOTH AEROBIC AND ANAEROBIC BOTTLES CRITICAL RESULT CALLED TO, READ BACK BY AND VERIFIED WITH: L SEAY,PHARMD'@0629'$  07/25/21 [place] Performed at [facility], [place] [address], [place], [place] [address]    Culture GRAM POSITIVE COCCI  Final   Report Status PENDING  Incomplete  Blood Culture ID Panel (Reflexed)     Status: Abnormal   Collection Time: 07/24/21 10:48 AM  Result Value Ref Range Status   Enterococcus faecalis NOT DETECTED NOT DETECTED Final   Enterococcus Faecium NOT DETECTED NOT DETECTED Final   Listeria monocytogenes NOT DETECTED NOT DETECTED Final   Staphylococcus species DETECTED (A) NOT DETECTED Final    Comment: CRITICAL RESULT CALLED TO, READ BACK BY AND VERIFIED WITH: L SEAY,PHARMD'@0630'$  07/25/21 [place]    Staphylococcus  aureus (BCID) DETECTED (A) NOT DETECTED Final    Comment: CRITICAL RESULT CALLED TO, READ BACK BY AND VERIFIED WITH: L SEAY,PHARMD'@0630'$  07/25/21 [place]    Staphylococcus epidermidis NOT DETECTED NOT DETECTED Final   Staphylococcus lugdunensis NOT DETECTED NOT DETECTED Final   Streptococcus species NOT DETECTED NOT DETECTED Final   Streptococcus agalactiae NOT DETECTED NOT DETECTED Final   Streptococcus pneumoniae NOT DETECTED NOT DETECTED Final   Streptococcus pyogenes NOT DETECTED NOT DETECTED Final   A.calcoaceticus-baumannii NOT DETECTED NOT DETECTED Final   Bacteroides fragilis NOT DETECTED NOT DETECTED Final   Enterobacterales NOT DETECTED NOT DETECTED Final   Enterobacter cloacae complex NOT DETECTED NOT DETECTED Final   Escherichia coli NOT DETECTED NOT DETECTED Final   Klebsiella aerogenes NOT DETECTED NOT DETECTED Final   Klebsiella oxytoca NOT DETECTED NOT DETECTED Final   Klebsiella pneumoniae NOT DETECTED NOT DETECTED Final   Proteus species NOT DETECTED NOT DETECTED Final   Salmonella species NOT DETECTED NOT DETECTED Final   Serratia marcescens NOT DETECTED NOT DETECTED Final   Haemophilus influenzae NOT DETECTED NOT DETECTED Final   Neisseria meningitidis NOT DETECTED NOT DETECTED Final   Pseudomonas aeruginosa NOT DETECTED NOT DETECTED Final   Stenotrophomonas maltophilia NOT DETECTED NOT DETECTED Final  Candida albicans NOT DETECTED NOT DETECTED Final   Candida auris NOT DETECTED NOT DETECTED Final   Candida glabrata NOT DETECTED NOT DETECTED Final   Candida krusei NOT DETECTED NOT DETECTED Final   Candida parapsilosis NOT DETECTED NOT DETECTED Final   Candida tropicalis NOT DETECTED NOT DETECTED Final   Cryptococcus neoformans/gattii NOT DETECTED NOT DETECTED Final   Meth resistant mecA/C and MREJ NOT DETECTED NOT DETECTED Final    Comment: Performed at [facility], [place] [address], [place], [place] [address]  Culture, blood (routine x 2)     Status: None  (Preliminary result)   Collection Time: 07/25/21  8:06 AM   Specimen: BLOOD  Result Value Ref Range Status   Specimen Description BLOOD LEFT ANTECUBITAL  Final   Special Requests   Final    BOTTLES DRAWN AEROBIC AND ANAEROBIC Blood Culture adequate volume   Culture   Final    NO GROWTH < 24 HOURS Performed at [facility], [place] [address], [place], [place] [address]    Report Status PENDING  Incomplete  Culture, blood (routine x 2)     Status: None (Preliminary result)   Collection Time: 07/25/21  8:06 AM   Specimen: BLOOD  Result Value Ref Range Status   Specimen Description BLOOD LEFT ANTECUBITAL  Final   Special Requests   Final    BOTTLES DRAWN AEROBIC AND ANAEROBIC Blood Culture adequate volume   Culture   Final    NO GROWTH < 24 HOURS Performed at [facility], [place] [address], [place], [place] [address]    Report Status PENDING  Incomplete     Medications:    vitamin C  500 mg Oral Daily   benzonatate  100 mg Oral TID   Chlorhexidine Gluconate Cloth  6 each Topical Daily   enoxaparin (LOVENOX) injection  30 mg Subcutaneous QHS   feeding supplement  237 mL Oral TID BM   guaiFENesin  1,200 mg Oral BID   mouth rinse  15 mL Mouth Rinse BID   metoprolol tartrate  12.5 mg Oral BID   multivitamin with minerals  1 tablet Oral Daily   pantoprazole  40 mg Oral BID   zinc sulfate  220 mg Oral Daily   Continuous Infusions:  sodium chloride 500 mL (07/11/21 1647)   sodium chloride 75 mL/hr at 07/26/21 0851    ceFAZolin (ANCEF) IV 2 g (07/26/21 2019)      LOS: 32 days   [facility]  07/27/2021, 7:35 AM

## 2021-07-27 NOTE — Progress Notes (Signed)
PHARMACY NOTE:  ANTIMICROBIAL RENAL DOSAGE ADJUSTMENT  Current antimicrobial regimen includes a mismatch between antimicrobial dosage and estimated renal function.  As per policy approved by the Pharmacy & Therapeutics and Medical Executive Committees, the antimicrobial dosage will be adjusted accordingly.  Current antimicrobial dosage:  Cefazolin 2gm IV q12h  Indication: MSSA bacteremia  Renal Function:  Creatinine was up to 1.25>1.46 from baseline ~0.9, now back to baseline.  ARB held 8/23. IV fluids given.  Estimated Creatinine Clearance: 40.7 mL/min (by C-G formula based on SCr of 0.93 mg/dL). '[]'$      On intermittent HD, scheduled: '[]'$      On CRRT    Antimicrobial dosage has been changed to:    Cefazolin 2gm IV q8h  Thank you for allowing pharmacy to be a part of this patient's care.  Arty Baumgartner, [facility] 07/27/2021 11:52 AM

## 2021-07-27 NOTE — TOC Benefit Eligibility Note (Signed)
Patient Advocate Encounter  Prior Authorization for Linezolid 600 mg has been approved.    PA# [id number] Effective dates: 07/27/2021 through 08/24/2021  Patients co-pay is $100.00.     Lyndel Safe, [place] Patient Advocate Specialist [place] Antimicrobial Stewardship Team Direct Number: [phone]  Fax: [phone]

## 2021-07-28 DIAGNOSIS — R7881 Bacteremia: Secondary | ICD-10-CM | POA: Diagnosis not present

## 2021-07-28 DIAGNOSIS — B9561 Methicillin susceptible Staphylococcus aureus infection as the cause of diseases classified elsewhere: Secondary | ICD-10-CM | POA: Diagnosis not present

## 2021-07-28 MED ORDER — GUAIFENESIN-DM 100-10 MG/5ML PO SYRP
10.0000 mL | ORAL_SOLUTION | ORAL | Status: DC | PRN
  Administered 2021-07-29 – 2021-07-30 (×3): 10 mL via ORAL
  Filled 2021-07-28 (×5): qty 10

## 2021-07-28 MED ORDER — LINEZOLID 600 MG PO TABS
600.0000 mg | ORAL_TABLET | Freq: Two times a day (BID) | ORAL | Status: DC
  Administered 2021-07-28 – 2021-08-03 (×13): 600 mg via ORAL
  Filled 2021-07-28 (×14): qty 1

## 2021-07-28 NOTE — Progress Notes (Signed)
[facility] PROGRESS NOTE    Progress Note  Mary Strickland  [id number] DOB: May 19, 1930 DOA: 06/24/2021 PCP: Burnard Bunting, MD     Brief Narrative:   Mary Strickland is an 87 y.o. female past medical history of tobacco abuse, essential hypertension came in complaining of hemoptysis severe hypoxia requiring endotracheal intubation.  Was found to have a pneumomediastinum with a more thorax with chest tube placement.  Her hemoptysis was treated conservatively patient was initially transferred to comfort measures with a chest tube removed.  During her admission she was also treated for pneumonia thought to be COVID-19, developed acute urinary retention.  Prior to second attempt to discharge patient developed fevers BC ID was significant for staph aureus MSSA.  Assessment/Plan:   Acute respiratory failure with hypoxia likely secondary to COVID-19 pneumonia/multifocal pneumonia: Treated empirically with antibiotics for which she completed her course in-house. She also completed her course of remdesivir and Decadron. Out of bed to chair and weaned to room air. Physical therapy recommended skilled nursing facility.  Left pneumothorax/pneumomediastinum/hemoptysis: Aspirin discontinued she required chest tube from 06/26/2021 to 06/29/2021 no further hemoptysis or intervention.  MSSA bacteremia: Likely source respiratory tract infection which required intubation. Sputum cultures have been negative till date. Blood culture grew MSSA, surveillance blood cultures have been negative till date. IV cefazolin versus Zyvox.  Awaiting further recommendations.  Acute urinary retention: Foley was placed on 07/10/2021 discontinued on 07/19/2021 now resolved. Continue strict I's and O's.  Elevated troponins likely due to demand ischemia: Cardiology was consulted TEE showed no wall motion abnormalities preserved EF, cardiology signed off on 06/30/2021 secondary to decision of patient  transitioning to comfort measures which is not the case at this point in time. Will need an outpatient follow-up with cardiology.  Malpositioned left IJ: CT neck without focal vascular injuries now discontinued.  Essential hypertension: Continue current antihypertensive medication blood pressure seems to be well controlled.   DVT prophylaxis: lovenox Family Communication:none Status is: Inpatient  Remains inpatient appropriate because:Hemodynamically unstable  Dispo: The patient is from: Home              Anticipated d/c is to: Home              Patient currently is not medically stable to d/c.   Difficult to place patient No   Code Status:     Code Status Orders  (From admission, onward)           Start     Ordered   06/25/21 0354  Do not attempt resuscitation (DNR)  Continuous       Question Answer Comment  In the event of cardiac or respiratory ARREST Do not call a "code blue"   In the event of cardiac or respiratory ARREST Do not perform Intubation, CPR, defibrillation or ACLS   In the event of cardiac or respiratory ARREST Use medication by any route, position, wound care, and other measures to relive pain and suffering. May use oxygen, suction and manual treatment of airway obstruction as needed for comfort.      06/25/21 0353           Code Status History     Date Active Date Inactive Code Status Order ID Comments User Context   06/25/2021 0314 06/25/2021 0353 Full Code [id number]  Renee Pain, MD ED         IV Access:   Peripheral IV   Procedures and diagnostic studies:   No results found.   Medical  Consultants:   None.   Subjective:    Mary Strickland no complaints.  Objective:    Vitals:   07/27/21 2034 07/28/21 0010 07/28/21 0402 07/28/21 0411  BP: (!) 159/76 (!) 169/91 137/60   Pulse: (!) 102     Resp: '20 18 16   '$ Temp: (!) 97.4 F (36.3 C) 98 F (36.7 C) 98.2 F (36.8 C)   TempSrc: Axillary Axillary Oral    SpO2: 95% 95% 98%   Weight:    78.9 kg  Height:       SpO2: 98 % O2 Flow Rate (L/min): 2 L/min FiO2 (%): 40 %   Intake/Output Summary (Last 24 hours) at 07/28/2021 0802 Last data filed at 07/28/2021 0617 Gross per 24 hour  Intake 398.13 ml  Output 650 ml  Net -251.87 ml    Filed Weights   07/26/21 0431 07/27/21 0333 07/28/21 0411  Weight: 78.9 kg 78.9 kg 78.9 kg    Exam: General exam: In no acute distress. Respiratory system: Good air movement and clear to auscultation. Cardiovascular system: S1 & S2 heard, RRR. No JVD. Gastrointestinal system: Abdomen is nondistended, soft and nontender.  Extremities: No pedal edema. Skin: No rashes, lesions or ulcers  Data Reviewed:    Labs: Basic Metabolic Panel: Recent Labs  Lab 07/24/21 1109 07/25/21 0807 07/27/21 0148  NA 131* 133* 134*  K 4.1 4.3 4.6  CL 96* 98 102  CO2 '23 26 25  '$ GLUCOSE 96 94 94  BUN 43* 47* 24*  CREATININE 1.25* 1.46* 0.93  CALCIUM 8.6* 8.3* 8.3*    GFR Estimated Creatinine Clearance: 40.7 mL/min (by C-G formula based on SCr of 0.93 mg/dL). Liver Function Tests: Recent Labs  Lab 07/24/21 1109  AST 22  ALT 18  ALKPHOS 41  BILITOT 1.7*  PROT 6.8  ALBUMIN 2.6*    No results for input(s): LIPASE, AMYLASE in the last 168 hours. No results for input(s): AMMONIA in the last 168 hours. Coagulation profile No results for input(s): INR, PROTIME in the last 168 hours. COVID-19 Labs  No results for input(s): DDIMER, FERRITIN, LDH, CRP in the last 72 hours.  Lab Results  Component Value Date   SARSCOV2NAA POSITIVE (A) 07/10/2021   [place] NEGATIVE 06/24/2021    CBC: Recent Labs  Lab 07/24/21 1109 07/25/21 0100  WBC 11.9* 12.0*  HGB 10.5* 10.1*  HCT 32.8* 31.3*  MCV 93.2 92.9  PLT 206 210    Cardiac Enzymes: No results for input(s): CKTOTAL, CKMB, CKMBINDEX, TROPONINI in the last 168 hours. BNP (last 3 results) No results for input(s): PROBNP in the last 8760 hours. CBG: No  results for input(s): GLUCAP in the last 168 hours. D-Dimer: No results for input(s): DDIMER in the last 72 hours. Hgb A1c: No results for input(s): HGBA1C in the last 72 hours. Lipid Profile: No results for input(s): CHOL, HDL, LDLCALC, TRIG, CHOLHDL, LDLDIRECT in the last 72 hours. Thyroid function studies: No results for input(s): TSH, T4TOTAL, T3FREE, THYROIDAB in the last 72 hours.  Invalid input(s): FREET3 Anemia work up: No results for input(s): VITAMINB12, FOLATE, FERRITIN, TIBC, IRON, RETICCTPCT in the last 72 hours. Sepsis Labs: Recent Labs  Lab 07/24/21 1109 07/25/21 0100 07/26/21 0333  PROCALCITON 0.58 2.09 2.00  WBC 11.9* 12.0*  --     Microbiology Recent Results (from the past 240 hour(s))  Culture, blood (routine x 2)     Status: Abnormal   Collection Time: 07/24/21 10:39 AM   Specimen: BLOOD  Result Value  Ref Range Status   Specimen Description BLOOD LEFT ANTECUBITAL  Final   Special Requests   Final    BOTTLES DRAWN AEROBIC AND ANAEROBIC Blood Culture adequate volume   Culture  Setup Time   Final    GRAM POSITIVE COCCI IN CLUSTERS IN BOTH AEROBIC AND ANAEROBIC BOTTLES    Culture (A)  Final    STAPHYLOCOCCUS AUREUS SUSCEPTIBILITIES PERFORMED ON PREVIOUS CULTURE WITHIN THE LAST 5 DAYS. Performed at [facility], [place] [address] [place], [place] [address]    Report Status 07/27/2021 FINAL  Final  Culture, blood (routine x 2)     Status: Abnormal   Collection Time: 07/24/21 10:48 AM   Specimen: BLOOD  Result Value Ref Range Status   Specimen Description BLOOD RIGHT ANTECUBITAL  Final   Special Requests   Final    BOTTLES DRAWN AEROBIC AND ANAEROBIC Blood Culture adequate volume   Culture  Setup Time   Final    GRAM POSITIVE COCCI IN CLUSTERS IN BOTH AEROBIC AND ANAEROBIC BOTTLES CRITICAL RESULT CALLED TO, READ BACK BY AND VERIFIED WITH: L SEAY,PHARMD'@0629'$  07/25/21 [place] Performed at [facility], [place] [address], [place], [place] [address]     Culture STAPHYLOCOCCUS AUREUS (A)  Final   Report Status 07/27/2021 FINAL  Final   Organism ID, Bacteria STAPHYLOCOCCUS AUREUS  Final      Susceptibility   Staphylococcus aureus - MIC*    CIPROFLOXACIN <=0.5 SENSITIVE Sensitive     ERYTHROMYCIN <=0.25 SENSITIVE Sensitive     GENTAMICIN <=0.5 SENSITIVE Sensitive     OXACILLIN 0.5 SENSITIVE Sensitive     TETRACYCLINE <=1 SENSITIVE Sensitive     VANCOMYCIN 1 SENSITIVE Sensitive     TRIMETH/SULFA <=10 SENSITIVE Sensitive     CLINDAMYCIN <=0.25 SENSITIVE Sensitive     RIFAMPIN <=0.5 SENSITIVE Sensitive     Inducible Clindamycin NEGATIVE Sensitive     * STAPHYLOCOCCUS AUREUS  Blood Culture ID Panel (Reflexed)     Status: Abnormal   Collection Time: 07/24/21 10:48 AM  Result Value Ref Range Status   Enterococcus faecalis NOT DETECTED NOT DETECTED Final   Enterococcus Faecium NOT DETECTED NOT DETECTED Final   Listeria monocytogenes NOT DETECTED NOT DETECTED Final   Staphylococcus species DETECTED (A) NOT DETECTED Final    Comment: CRITICAL RESULT CALLED TO, READ BACK BY AND VERIFIED WITH: L SEAY,PHARMD'@0630'$  07/25/21 [place]    Staphylococcus aureus (BCID) DETECTED (A) NOT DETECTED Final    Comment: CRITICAL RESULT CALLED TO, READ BACK BY AND VERIFIED WITH: L SEAY,PHARMD'@0630'$  07/25/21 [place]    Staphylococcus epidermidis NOT DETECTED NOT DETECTED Final   Staphylococcus lugdunensis NOT DETECTED NOT DETECTED Final   Streptococcus species NOT DETECTED NOT DETECTED Final   Streptococcus agalactiae NOT DETECTED NOT DETECTED Final   Streptococcus pneumoniae NOT DETECTED NOT DETECTED Final   Streptococcus pyogenes NOT DETECTED NOT DETECTED Final   A.calcoaceticus-baumannii NOT DETECTED NOT DETECTED Final   Bacteroides fragilis NOT DETECTED NOT DETECTED Final   Enterobacterales NOT DETECTED NOT DETECTED Final   Enterobacter cloacae complex NOT DETECTED NOT DETECTED Final   Escherichia coli NOT DETECTED NOT DETECTED Final   Klebsiella aerogenes NOT  DETECTED NOT DETECTED Final   Klebsiella oxytoca NOT DETECTED NOT DETECTED Final   Klebsiella pneumoniae NOT DETECTED NOT DETECTED Final   Proteus species NOT DETECTED NOT DETECTED Final   Salmonella species NOT DETECTED NOT DETECTED Final   Serratia marcescens NOT DETECTED NOT DETECTED Final   Haemophilus influenzae NOT DETECTED NOT DETECTED  Final   Neisseria meningitidis NOT DETECTED NOT DETECTED Final   Pseudomonas aeruginosa NOT DETECTED NOT DETECTED Final   Stenotrophomonas maltophilia NOT DETECTED NOT DETECTED Final   Candida albicans NOT DETECTED NOT DETECTED Final   Candida auris NOT DETECTED NOT DETECTED Final   Candida glabrata NOT DETECTED NOT DETECTED Final   Candida krusei NOT DETECTED NOT DETECTED Final   Candida parapsilosis NOT DETECTED NOT DETECTED Final   Candida tropicalis NOT DETECTED NOT DETECTED Final   Cryptococcus neoformans/gattii NOT DETECTED NOT DETECTED Final   Meth resistant mecA/C and MREJ NOT DETECTED NOT DETECTED Final    Comment: Performed at [facility], [place] [address] [place], [place] [address]  Culture, blood (routine x 2)     Status: None (Preliminary result)   Collection Time: 07/25/21  8:06 AM   Specimen: BLOOD  Result Value Ref Range Status   Specimen Description BLOOD LEFT ANTECUBITAL  Final   Special Requests   Final    BOTTLES DRAWN AEROBIC AND ANAEROBIC Blood Culture adequate volume   Culture   Final    NO GROWTH 2 DAYS Performed at [facility], [place] [address] [place], [place] [address]    Report Status PENDING  Incomplete  Culture, blood (routine x 2)     Status: None (Preliminary result)   Collection Time: 07/25/21  8:06 AM   Specimen: BLOOD  Result Value Ref Range Status   Specimen Description BLOOD LEFT ANTECUBITAL  Final   Special Requests   Final    BOTTLES DRAWN AEROBIC AND ANAEROBIC Blood Culture adequate volume   Culture   Final    NO GROWTH 2 DAYS Performed at [facility], [place] [address],  [place], [place] [address]    Report Status PENDING  Incomplete     Medications:    vitamin C  500 mg Oral Daily   benzonatate  100 mg Oral TID   Chlorhexidine Gluconate Cloth  6 each Topical Daily   enoxaparin (LOVENOX) injection  40 mg Subcutaneous QHS   feeding supplement  237 mL Oral TID BM   guaiFENesin  1,200 mg Oral BID   mouth rinse  15 mL Mouth Rinse BID   metoprolol tartrate  12.5 mg Oral BID   multivitamin with minerals  1 tablet Oral Daily   pantoprazole  40 mg Oral BID   zinc sulfate  220 mg Oral Daily   Continuous Infusions:  sodium chloride 500 mL (07/11/21 1647)    ceFAZolin (ANCEF) IV 2 g (07/28/21 0158)      LOS: 33 days   Charlynne Cousins  [facility]  07/28/2021, 8:02 AM

## 2021-07-28 NOTE — Progress Notes (Signed)
[facility] for Infectious Disease  Date of Admission:  06/24/2021     Total days of antibiotics 17         ASSESSMENT:  Ms. [place]ette blood cultures from 07/25/21 remain without growth to date. Suspect this is either a pulmonary source or simple bacteremia with MSSA. Clinically continues to improve. Will transition to Zyvox for 2 weeks. [place] for discharge from ID standpoint. Continue supportive care per primary team. ID will sign off.   PLAN:  Change Cefazolin to Linezolid 600 mg bid through 9/6. [place] for discharge from ID standpoint. Continue supportive care per primary team.    Principal Problem:   MSSA bacteremia Active Problems:   Hypertension   Hemoptysis   Acute hypoxemic respiratory failure (HCC)   Abnormal CXR   Leukocytosis   Subcutaneous emphysema (HCC)   Pneumoperitoneum of unknown etiology   Pneumothorax on left   COVID    vitamin C  500 mg Oral Daily   benzonatate  100 mg Oral TID   Chlorhexidine Gluconate Cloth  6 each Topical Daily   enoxaparin (LOVENOX) injection  40 mg Subcutaneous QHS   feeding supplement  237 mL Oral TID BM   guaiFENesin  1,200 mg Oral BID   mouth rinse  15 mL Mouth Rinse BID   metoprolol tartrate  12.5 mg Oral BID   multivitamin with minerals  1 tablet Oral Daily   pantoprazole  40 mg Oral BID   zinc sulfate  220 mg Oral Daily    SUBJECTIVE:  Afebrile overnight with no acute events. Breathing doing okay with continued cough. No fevers/chills.   Allergies  Allergen Reactions   Sulfamethoxazole-Trimethoprim Shortness Of Breath and Itching   Percocet [Oxycodone-Acetaminophen] Other (See Comments)    Unknown reaction   Zocor [Simvastatin] Other (See Comments)    Unknown reaction     Review of Systems: Review of Systems  Constitutional:  Negative for chills, fever and weight loss.  Respiratory:  Positive for cough and sputum production. Negative for shortness of breath and wheezing.   Cardiovascular:  Negative for chest  pain and leg swelling.  Gastrointestinal:  Negative for abdominal pain, constipation, diarrhea, nausea and vomiting.  Skin:  Negative for rash.     OBJECTIVE: Vitals:   07/28/21 0402 07/28/21 0411 07/28/21 0826 07/28/21 1250  BP: 137/60  (!) 137/59 (!) 155/66  Pulse:   92 100  Resp: '16  16 18  '$ Temp: 98.2 F (36.8 C)  98.2 F (36.8 C) 98.7 F (37.1 C)  TempSrc: Oral  Oral Oral  SpO2: 98%  94% 94%  Weight:  78.9 kg    Height:       Body mass index is 31.81 kg/m.  Physical Exam Constitutional:      General: She is not in acute distress.    Appearance: She is well-developed.  Cardiovascular:     Rate and Rhythm: Normal rate and regular rhythm.     Heart sounds: Normal heart sounds.  Pulmonary:     Effort: Pulmonary effort is normal.     Breath sounds: Rhonchi present.  Skin:    General: Skin is warm and dry.  Neurological:     Mental Status: She is alert and oriented to person, place, and time.  Psychiatric:        Behavior: Behavior normal.        Thought Content: Thought content normal.        Judgment: Judgment normal.    Lab Results Lab  Results  Component Value Date   WBC 12.0 (H) 07/25/2021   HGB 10.1 (L) 07/25/2021   HCT 31.3 (L) 07/25/2021   MCV 92.9 07/25/2021   PLT 210 07/25/2021    Lab Results  Component Value Date   CREATININE 0.93 07/27/2021   BUN 24 (H) 07/27/2021   NA 134 (L) 07/27/2021   K 4.6 07/27/2021   CL 102 07/27/2021   CO2 25 07/27/2021    Lab Results  Component Value Date   ALT 18 07/24/2021   AST 22 07/24/2021   ALKPHOS 41 07/24/2021   BILITOT 1.7 (H) 07/24/2021     Microbiology: Recent Results (from the past 240 hour(s))  Culture, blood (routine x 2)     Status: Abnormal   Collection Time: 07/24/21 10:39 AM   Specimen: BLOOD  Result Value Ref Range Status   Specimen Description BLOOD LEFT ANTECUBITAL  Final   Special Requests   Final    BOTTLES DRAWN AEROBIC AND ANAEROBIC Blood Culture adequate volume   Culture  Setup  Time   Final    GRAM POSITIVE COCCI IN CLUSTERS IN BOTH AEROBIC AND ANAEROBIC BOTTLES    Culture (A)  Final    STAPHYLOCOCCUS AUREUS SUSCEPTIBILITIES PERFORMED ON PREVIOUS CULTURE WITHIN THE LAST 5 DAYS. Performed at [facility], [place] [address], [place], [place] [address]    Report Status 07/27/2021 FINAL  Final  Culture, blood (routine x 2)     Status: Abnormal   Collection Time: 07/24/21 10:48 AM   Specimen: BLOOD  Result Value Ref Range Status   Specimen Description BLOOD RIGHT ANTECUBITAL  Final   Special Requests   Final    BOTTLES DRAWN AEROBIC AND ANAEROBIC Blood Culture adequate volume   Culture  Setup Time   Final    GRAM POSITIVE COCCI IN CLUSTERS IN BOTH AEROBIC AND ANAEROBIC BOTTLES CRITICAL RESULT CALLED TO, READ BACK BY AND VERIFIED WITH: L SEAY,PHARMD'@0629'$  07/25/21 [place] Performed at [facility], [place] [address] [place], [place] [address]    Culture STAPHYLOCOCCUS AUREUS (A)  Final   Report Status 07/27/2021 FINAL  Final   Organism ID, Bacteria STAPHYLOCOCCUS AUREUS  Final      Susceptibility   Staphylococcus aureus - MIC*    CIPROFLOXACIN <=0.5 SENSITIVE Sensitive     ERYTHROMYCIN <=0.25 SENSITIVE Sensitive     GENTAMICIN <=0.5 SENSITIVE Sensitive     OXACILLIN 0.5 SENSITIVE Sensitive     TETRACYCLINE <=1 SENSITIVE Sensitive     VANCOMYCIN 1 SENSITIVE Sensitive     TRIMETH/SULFA <=10 SENSITIVE Sensitive     CLINDAMYCIN <=0.25 SENSITIVE Sensitive     RIFAMPIN <=0.5 SENSITIVE Sensitive     Inducible Clindamycin NEGATIVE Sensitive     * STAPHYLOCOCCUS AUREUS  Blood Culture ID Panel (Reflexed)     Status: Abnormal   Collection Time: 07/24/21 10:48 AM  Result Value Ref Range Status   Enterococcus faecalis NOT DETECTED NOT DETECTED Final   Enterococcus Faecium NOT DETECTED NOT DETECTED Final   Listeria monocytogenes NOT DETECTED NOT DETECTED Final   Staphylococcus species DETECTED (A) NOT DETECTED Final    Comment: CRITICAL RESULT CALLED TO, READ  BACK BY AND VERIFIED WITH: L SEAY,PHARMD'@0630'$  07/25/21 [place]    Staphylococcus aureus (BCID) DETECTED (A) NOT DETECTED Final    Comment: CRITICAL RESULT CALLED TO, READ BACK BY AND VERIFIED WITH: L SEAY,PHARMD'@0630'$  07/25/21 [place]    Staphylococcus epidermidis NOT DETECTED NOT DETECTED Final   Staphylococcus lugdunensis NOT DETECTED NOT DETECTED Final  Streptococcus species NOT DETECTED NOT DETECTED Final   Streptococcus agalactiae NOT DETECTED NOT DETECTED Final   Streptococcus pneumoniae NOT DETECTED NOT DETECTED Final   Streptococcus pyogenes NOT DETECTED NOT DETECTED Final   A.calcoaceticus-baumannii NOT DETECTED NOT DETECTED Final   Bacteroides fragilis NOT DETECTED NOT DETECTED Final   Enterobacterales NOT DETECTED NOT DETECTED Final   Enterobacter cloacae complex NOT DETECTED NOT DETECTED Final   Escherichia coli NOT DETECTED NOT DETECTED Final   Klebsiella aerogenes NOT DETECTED NOT DETECTED Final   Klebsiella oxytoca NOT DETECTED NOT DETECTED Final   Klebsiella pneumoniae NOT DETECTED NOT DETECTED Final   Proteus species NOT DETECTED NOT DETECTED Final   Salmonella species NOT DETECTED NOT DETECTED Final   Serratia marcescens NOT DETECTED NOT DETECTED Final   Haemophilus influenzae NOT DETECTED NOT DETECTED Final   Neisseria meningitidis NOT DETECTED NOT DETECTED Final   Pseudomonas aeruginosa NOT DETECTED NOT DETECTED Final   Stenotrophomonas maltophilia NOT DETECTED NOT DETECTED Final   Candida albicans NOT DETECTED NOT DETECTED Final   Candida auris NOT DETECTED NOT DETECTED Final   Candida glabrata NOT DETECTED NOT DETECTED Final   Candida krusei NOT DETECTED NOT DETECTED Final   Candida parapsilosis NOT DETECTED NOT DETECTED Final   Candida tropicalis NOT DETECTED NOT DETECTED Final   Cryptococcus neoformans/gattii NOT DETECTED NOT DETECTED Final   Meth resistant mecA/C and MREJ NOT DETECTED NOT DETECTED Final    Comment: Performed at [facility], [address], [place], [place] [address]  Culture, blood (routine x 2)     Status: None (Preliminary result)   Collection Time: 07/25/21  8:06 AM   Specimen: BLOOD  Result Value Ref Range Status   Specimen Description BLOOD LEFT ANTECUBITAL  Final   Special Requests   Final    BOTTLES DRAWN AEROBIC AND ANAEROBIC Blood Culture adequate volume   Culture   Final    NO GROWTH 3 DAYS Performed at [facility], [place] [address] [place] [address], [place], [place] [address]    Report Status PENDING  Incomplete  Culture, blood (routine x 2)     Status: None (Preliminary result)   Collection Time: 07/25/21  8:06 AM   Specimen: BLOOD  Result Value Ref Range Status   Specimen Description BLOOD LEFT ANTECUBITAL  Final   Special Requests   Final    BOTTLES DRAWN AEROBIC AND ANAEROBIC Blood Culture adequate volume   Culture   Final    NO GROWTH 3 DAYS Performed at [facility], [place] [address] [place], [place] [address]    Report Status PENDING  Incomplete     Terri Piedra, [facility]  07/28/2021  1:05 PM

## 2021-07-29 NOTE — TOC Progression Note (Signed)
Transition of Care [facility]) - Progression Note    Patient Details  Name: Mary Strickland MRN: [id number] Date of Birth: 02-26-1930  Transition of Care [facility]) CM/SW [place], [place] Phone Number: [phone] 07/29/2021, 2:15 PM  Clinical Narrative:     CSW spoke with pt's granddaughter(Candance) to verify pt's discharge plan. Candance confirmed that the plan is for pt to be discharged to [facility] and [facility] in [place]. Candance explained that she would like to attempt again to try for an insurance authorization through [place]. CSW explained that authorization would be secured by facility.  CSW reached out to corporate office and was unable to reach [place]. CSW called [facility] [phone]) and left voicemail to call CSW back about authorization.  TOC team will continue to assist with discharge planning needs.   Expected Discharge Plan: Home/Self Care Barriers to Discharge: Barriers Resolved  Expected Discharge Plan and Services Expected Discharge Plan: Home/Self Care In-house Referral: Clinical Social Work     Living arrangements for the past 2 months: Single Family Home Expected Discharge Date: 07/07/21                                     Social Determinants of Health (SDOH) Interventions    Readmission Risk Interventions No flowsheet data found.

## 2021-07-29 NOTE — Progress Notes (Addendum)
[facility] PROGRESS NOTE    Progress Note  Mary Strickland  [id number] DOB: August 25, 1930 DOA: 06/24/2021 PCP: Burnard Bunting, MD     Brief Narrative:   Mary Strickland is an 87 y.o. female past medical history of tobacco abuse, essential hypertension came in complaining of hemoptysis severe hypoxia requiring endotracheal intubation.  Was found to have a pneumomediastinum with a more thorax with chest tube placement.  Her hemoptysis was treated conservatively patient was initially transferred to comfort measures with a chest tube removed.  During her admission she was also treated for pneumonia thought to be COVID-19, developed acute urinary retention.  Prior to second attempt to discharge patient developed fevers BC ID was significant for staph aureus MSSA.  Patient is medically stable to be transferred to skilled nursing facility  Assessment/Plan:   Acute respiratory failure with hypoxia likely secondary to COVID-19 pneumonia/multifocal pneumonia: Treated empirically with antibiotics for which she completed her course in-house. She also completed her course of remdesivir and Decadron. Out of bed to chair and weaned to room air. Physical therapy recommended skilled nursing facility.  Left pneumothorax/pneumomediastinum/hemoptysis: Aspirin discontinued she required chest tube from 06/26/2021 to 06/29/2021 no further hemoptysis or intervention.  MSSA bacteremia: Likely source respiratory tract infection which required intubation. Sputum cultures have been negative till date. Blood culture grew MSSA, surveillance blood cultures have been negative till date. ID recommended to switch to oral Linezolid 08/09/2019  Acute urinary retention: Foley was placed on 07/10/2021 discontinued on 07/19/2021 now resolved. Continue strict I's and O's.  Elevated troponins likely due to demand ischemia: Cardiology was consulted TEE showed no wall motion abnormalities preserved EF,  cardiology signed off on 06/30/2021 secondary to decision of patient transitioning to comfort measures which is not the case at this point in time. Will need an outpatient follow-up with cardiology.  Malpositioned left IJ: CT neck without focal vascular injuries now discontinued.  Essential hypertension: Continue current antihypertensive medication blood pressure seems to be well controlled.   DVT prophylaxis: lovenox Family Communication:none Status is: Inpatient  Remains inpatient appropriate because:Hemodynamically unstable  Dispo: The patient is from: Home              Anticipated d/c is to: Home              Patient currently is not medically stable to d/c.   Difficult to place patient No   Code Status:     Code Status Orders  (From admission, onward)           Start     Ordered   06/25/21 0354  Do not attempt resuscitation (DNR)  Continuous       Question Answer Comment  In the event of cardiac or respiratory ARREST Do not call a "code blue"   In the event of cardiac or respiratory ARREST Do not perform Intubation, CPR, defibrillation or ACLS   In the event of cardiac or respiratory ARREST Use medication by any route, position, wound care, and other measures to relive pain and suffering. May use oxygen, suction and manual treatment of airway obstruction as needed for comfort.      06/25/21 0353           Code Status History     Date Active Date Inactive Code Status Order ID Comments User Context   06/25/2021 0314 06/25/2021 0353 Full Code [id number]  Renee Pain, MD ED         IV Access:   Peripheral IV  Procedures and diagnostic studies:   No results found.   Medical Consultants:   None.   Subjective:    Mary Strickland no complaints  Objective:    Vitals:   07/28/21 2109 07/29/21 0010 07/29/21 0250 07/29/21 0608  BP: (!) 149/77 (!) 149/72 (!) 165/95   Pulse: 92 98 92   Resp: '19 20 20   '$ Temp: 98.7 F (37.1 C) 99 F  (37.2 C) 98.7 F (37.1 C)   TempSrc: Oral Oral Oral   SpO2: 96% 95% 91%   Weight:    78.9 kg  Height:       SpO2: 91 % O2 Flow Rate (L/min): 3 L/min FiO2 (%): 40 %  No intake or output data in the 24 hours ending 07/29/21 0815  Filed Weights   07/27/21 0333 07/28/21 0411 07/29/21 [id number]  Weight: 78.9 kg 78.9 kg 78.9 kg    Exam: General exam: In no acute distress. Respiratory system: Good air movement and clear to auscultation. Cardiovascular system: S1 & S2 heard, RRR. No JVD. Gastrointestinal system: Abdomen is nondistended, soft and nontender.  Extremities: No pedal edema. Skin: No rashes, lesions or ulcers  Data Reviewed:    Labs: Basic Metabolic Panel: Recent Labs  Lab 07/24/21 1109 07/25/21 0807 07/27/21 0148  NA 131* 133* 134*  K 4.1 4.3 4.6  CL 96* 98 102  CO2 '23 26 25  '$ GLUCOSE 96 94 94  BUN 43* 47* 24*  CREATININE 1.25* 1.46* 0.93  CALCIUM 8.6* 8.3* 8.3*    GFR Estimated Creatinine Clearance: 40.7 mL/min (by C-G formula based on SCr of 0.93 mg/dL). Liver Function Tests: Recent Labs  Lab 07/24/21 1109  AST 22  ALT 18  ALKPHOS 41  BILITOT 1.7*  PROT 6.8  ALBUMIN 2.6*    No results for input(s): LIPASE, AMYLASE in the last 168 hours. No results for input(s): AMMONIA in the last 168 hours. Coagulation profile No results for input(s): INR, PROTIME in the last 168 hours. COVID-19 Labs  No results for input(s): DDIMER, FERRITIN, LDH, CRP in the last 72 hours.  Lab Results  Component Value Date   SARSCOV2NAA POSITIVE (A) 07/10/2021   [place] NEGATIVE 06/24/2021    CBC: Recent Labs  Lab 07/24/21 1109 07/25/21 0100  WBC 11.9* 12.0*  HGB 10.5* 10.1*  HCT 32.8* 31.3*  MCV 93.2 92.9  PLT 206 210    Cardiac Enzymes: No results for input(s): CKTOTAL, CKMB, CKMBINDEX, TROPONINI in the last 168 hours. BNP (last 3 results) No results for input(s): PROBNP in the last 8760 hours. CBG: No results for input(s): GLUCAP in the last 168  hours. D-Dimer: No results for input(s): DDIMER in the last 72 hours. Hgb A1c: No results for input(s): HGBA1C in the last 72 hours. Lipid Profile: No results for input(s): CHOL, HDL, LDLCALC, TRIG, CHOLHDL, LDLDIRECT in the last 72 hours. Thyroid function studies: No results for input(s): TSH, T4TOTAL, T3FREE, THYROIDAB in the last 72 hours.  Invalid input(s): FREET3 Anemia work up: No results for input(s): VITAMINB12, FOLATE, FERRITIN, TIBC, IRON, RETICCTPCT in the last 72 hours. Sepsis Labs: Recent Labs  Lab 07/24/21 1109 07/25/21 0100 07/26/21 0333  PROCALCITON 0.58 2.09 2.00  WBC 11.9* 12.0*  --     Microbiology Recent Results (from the past 240 hour(s))  Culture, blood (routine x 2)     Status: Abnormal   Collection Time: 07/24/21 10:39 AM   Specimen: BLOOD  Result Value Ref Range Status   Specimen Description BLOOD LEFT ANTECUBITAL  Final   Special Requests   Final    BOTTLES DRAWN AEROBIC AND ANAEROBIC Blood Culture adequate volume   Culture  Setup Time   Final    GRAM POSITIVE COCCI IN CLUSTERS IN BOTH AEROBIC AND ANAEROBIC BOTTLES    Culture (A)  Final    STAPHYLOCOCCUS AUREUS SUSCEPTIBILITIES PERFORMED ON PREVIOUS CULTURE WITHIN THE LAST 5 DAYS. Performed at [facility], [place] [address], [place], [place] [address]    Report Status 07/27/2021 FINAL  Final  Culture, blood (routine x 2)     Status: Abnormal   Collection Time: 07/24/21 10:48 AM   Specimen: BLOOD  Result Value Ref Range Status   Specimen Description BLOOD RIGHT ANTECUBITAL  Final   Special Requests   Final    BOTTLES DRAWN AEROBIC AND ANAEROBIC Blood Culture adequate volume   Culture  Setup Time   Final    GRAM POSITIVE COCCI IN CLUSTERS IN BOTH AEROBIC AND ANAEROBIC BOTTLES CRITICAL RESULT CALLED TO, READ BACK BY AND VERIFIED WITH: L SEAY,PHARMD'@0629'$  07/25/21 [place] Performed at [facility], [place] [address], [place], [place] [address]    Culture STAPHYLOCOCCUS AUREUS (A)  Final    Report Status 07/27/2021 FINAL  Final   Organism ID, Bacteria STAPHYLOCOCCUS AUREUS  Final      Susceptibility   Staphylococcus aureus - MIC*    CIPROFLOXACIN <=0.5 SENSITIVE Sensitive     ERYTHROMYCIN <=0.25 SENSITIVE Sensitive     GENTAMICIN <=0.5 SENSITIVE Sensitive     OXACILLIN 0.5 SENSITIVE Sensitive     TETRACYCLINE <=1 SENSITIVE Sensitive     VANCOMYCIN 1 SENSITIVE Sensitive     TRIMETH/SULFA <=10 SENSITIVE Sensitive     CLINDAMYCIN <=0.25 SENSITIVE Sensitive     RIFAMPIN <=0.5 SENSITIVE Sensitive     Inducible Clindamycin NEGATIVE Sensitive     * STAPHYLOCOCCUS AUREUS  Blood Culture ID Panel (Reflexed)     Status: Abnormal   Collection Time: 07/24/21 10:48 AM  Result Value Ref Range Status   Enterococcus faecalis NOT DETECTED NOT DETECTED Final   Enterococcus Faecium NOT DETECTED NOT DETECTED Final   Listeria monocytogenes NOT DETECTED NOT DETECTED Final   Staphylococcus species DETECTED (A) NOT DETECTED Final    Comment: CRITICAL RESULT CALLED TO, READ BACK BY AND VERIFIED WITH: L SEAY,PHARMD'@0630'$  07/25/21 [place]    Staphylococcus aureus (BCID) DETECTED (A) NOT DETECTED Final    Comment: CRITICAL RESULT CALLED TO, READ BACK BY AND VERIFIED WITH: L SEAY,PHARMD'@0630'$  07/25/21 [place]    Staphylococcus epidermidis NOT DETECTED NOT DETECTED Final   Staphylococcus lugdunensis NOT DETECTED NOT DETECTED Final   Streptococcus species NOT DETECTED NOT DETECTED Final   Streptococcus agalactiae NOT DETECTED NOT DETECTED Final   Streptococcus pneumoniae NOT DETECTED NOT DETECTED Final   Streptococcus pyogenes NOT DETECTED NOT DETECTED Final   A.calcoaceticus-baumannii NOT DETECTED NOT DETECTED Final   Bacteroides fragilis NOT DETECTED NOT DETECTED Final   Enterobacterales NOT DETECTED NOT DETECTED Final   Enterobacter cloacae complex NOT DETECTED NOT DETECTED Final   Escherichia coli NOT DETECTED NOT DETECTED Final   Klebsiella aerogenes NOT DETECTED NOT DETECTED Final   Klebsiella  oxytoca NOT DETECTED NOT DETECTED Final   Klebsiella pneumoniae NOT DETECTED NOT DETECTED Final   Proteus species NOT DETECTED NOT DETECTED Final   Salmonella species NOT DETECTED NOT DETECTED Final   Serratia marcescens NOT DETECTED NOT DETECTED Final   Haemophilus influenzae NOT DETECTED NOT DETECTED Final   Neisseria meningitidis NOT DETECTED NOT DETECTED Final  Pseudomonas aeruginosa NOT DETECTED NOT DETECTED Final   Stenotrophomonas maltophilia NOT DETECTED NOT DETECTED Final   Candida albicans NOT DETECTED NOT DETECTED Final   Candida auris NOT DETECTED NOT DETECTED Final   Candida glabrata NOT DETECTED NOT DETECTED Final   Candida krusei NOT DETECTED NOT DETECTED Final   Candida parapsilosis NOT DETECTED NOT DETECTED Final   Candida tropicalis NOT DETECTED NOT DETECTED Final   Cryptococcus neoformans/gattii NOT DETECTED NOT DETECTED Final   Meth resistant mecA/C and MREJ NOT DETECTED NOT DETECTED Final    Comment: Performed at [facility], [place] [address] [place], [place] [address]  Culture, blood (routine x 2)     Status: None (Preliminary result)   Collection Time: 07/25/21  8:06 AM   Specimen: BLOOD  Result Value Ref Range Status   Specimen Description BLOOD LEFT ANTECUBITAL  Final   Special Requests   Final    BOTTLES DRAWN AEROBIC AND ANAEROBIC Blood Culture adequate volume   Culture   Final    NO GROWTH 3 DAYS Performed at [facility], [place] [address] [place], [place] [address]    Report Status PENDING  Incomplete  Culture, blood (routine x 2)     Status: None (Preliminary result)   Collection Time: 07/25/21  8:06 AM   Specimen: BLOOD  Result Value Ref Range Status   Specimen Description BLOOD LEFT ANTECUBITAL  Final   Special Requests   Final    BOTTLES DRAWN AEROBIC AND ANAEROBIC Blood Culture adequate volume   Culture   Final    NO GROWTH 3 DAYS Performed at [facility], [place] [address] [place],  [address]    Report Status PENDING   Incomplete     Medications:    vitamin C  500 mg Oral Daily   benzonatate  100 mg Oral TID   enoxaparin (LOVENOX) injection  40 mg Subcutaneous QHS   feeding supplement  237 mL Oral TID BM   guaiFENesin  1,200 mg Oral BID   linezolid  600 mg Oral Q12H   mouth rinse  15 mL Mouth Rinse BID   metoprolol tartrate  12.5 mg Oral BID   multivitamin with minerals  1 tablet Oral Daily   pantoprazole  40 mg Oral BID   zinc sulfate  220 mg Oral Daily   Continuous Infusions:  sodium chloride 500 mL (07/11/21 1647)      LOS: 34 days   Mary Strickland  [facility]  07/29/2021, 8:15 AM

## 2021-07-30 LAB — CULTURE, BLOOD (ROUTINE X 2)
Culture: NO GROWTH
Culture: NO GROWTH
Special Requests: ADEQUATE
Special Requests: ADEQUATE

## 2021-07-30 NOTE — Progress Notes (Signed)
[facility] PROGRESS NOTE    Progress Note  Mary Strickland  [id number] DOB: 02-20-1930 DOA: 06/24/2021 PCP: Burnard Bunting, MD     Brief Narrative:   Mary Strickland is an 87 y.o. female past medical history of tobacco abuse, essential hypertension came in complaining of hemoptysis severe hypoxia requiring endotracheal intubation.  Was found to have a pneumomediastinum with a more thorax with chest tube placement.  Her hemoptysis was treated conservatively patient was initially transferred to comfort measures with a chest tube removed.  During her admission she was also treated for pneumonia thought to be COVID-19, developed acute urinary retention.  Prior to second attempt to discharge patient developed fevers BC ID was significant for staph aureus MSSA.  Patient is medically stable to be transferred to skilled nursing facility  Assessment/Plan:   Acute respiratory failure with hypoxia likely secondary to COVID-19 pneumonia/multifocal pneumonia: Treated empirically with antibiotics for which she completed her course in-house. She also completed her course of remdesivir and Decadron. Out of bed to chair and weaned to room air. Physical therapy recommended skilled nursing facility.  Left pneumothorax/pneumomediastinum/hemoptysis: Aspirin discontinued she required chest tube from 06/26/2021 to 06/29/2021 no further hemoptysis or intervention.  MSSA bacteremia: Likely source respiratory tract infection which required intubation. Sputum cultures have been negative till date. Blood culture grew MSSA, surveillance blood cultures have been negative till date. ID recommended to switch to oral Linezolid 08/09/2019  Acute urinary retention: Foley was placed on 07/10/2021 discontinued on 07/19/2021 now resolved. Continue strict I's and O's.  Elevated troponins likely due to demand ischemia: Cardiology was consulted TEE showed no wall motion abnormalities preserved EF,  cardiology signed off on 06/30/2021 secondary to decision of patient transitioning to comfort measures which is not the case at this point in time. Will need an outpatient follow-up with cardiology.  Malpositioned left IJ: CT neck without focal vascular injuries now discontinued.  Essential hypertension: Continue current antihypertensive medication blood pressure seems to be well controlled.   DVT prophylaxis: lovenox Family Communication:none Status is: Inpatient  Remains inpatient appropriate because:Hemodynamically unstable  Dispo: The patient is from: Home              Anticipated d/c is to: Home              Patient currently is not medically stable to d/c.   Difficult to place patient No   Code Status:     Code Status Orders  (From admission, onward)           Start     Ordered   06/25/21 0354  Do not attempt resuscitation (DNR)  Continuous       Question Answer Comment  In the event of cardiac or respiratory ARREST Do not call a "code blue"   In the event of cardiac or respiratory ARREST Do not perform Intubation, CPR, defibrillation or ACLS   In the event of cardiac or respiratory ARREST Use medication by any route, position, wound care, and other measures to relive pain and suffering. May use oxygen, suction and manual treatment of airway obstruction as needed for comfort.      06/25/21 0353           Code Status History     Date Active Date Inactive Code Status Order ID Comments User Context   06/25/2021 0314 06/25/2021 0353 Full Code [id number]  Renee Pain, MD ED         IV Access:   Peripheral IV  Procedures and diagnostic studies:   No results found.   Medical Consultants:   None.   Subjective:    Mary Strickland Stick no complaints  Objective:    Vitals:   07/29/21 2200 07/29/21 2232 07/29/21 2352 07/30/21 0430  BP:  133/63 123/63 122/60  Pulse:  95    Resp:  '20 17 16  '$ Temp: 98.3 F (36.8 C) 98.3 F (36.8 C) 98.3 F  (36.8 C) 98.7 F (37.1 C)  TempSrc: Oral Oral Oral Axillary  SpO2:  96% 98% 100%  Weight:      Height:       SpO2: 100 % O2 Flow Rate (L/min): 3 L/min FiO2 (%): 40 %   Intake/Output Summary (Last 24 hours) at 07/30/2021 0839 Last data filed at 07/29/2021 1333 Gross per 24 hour  Intake 480 ml  Output --  Net 480 ml    Filed Weights   07/27/21 0333 07/28/21 0411 07/29/21 0608  Weight: 78.9 kg 78.9 kg 78.9 kg    Exam: General exam: In no acute distress. Respiratory system: Good air movement and clear to auscultation. Cardiovascular system: S1 & S2 heard, RRR. No JVD. Gastrointestinal system: Abdomen is nondistended, soft and nontender.  Extremities: No pedal edema. Skin: No rashes, lesions or ulcers  Data Reviewed:    Labs: Basic Metabolic Panel: Recent Labs  Lab 07/24/21 1109 07/25/21 0807 07/27/21 0148  NA 131* 133* 134*  K 4.1 4.3 4.6  CL 96* 98 102  CO2 '23 26 25  '$ GLUCOSE 96 94 94  BUN 43* 47* 24*  CREATININE 1.25* 1.46* 0.93  CALCIUM 8.6* 8.3* 8.3*    GFR Estimated Creatinine Clearance: 40.7 mL/min (by C-G formula based on SCr of 0.93 mg/dL). Liver Function Tests: Recent Labs  Lab 07/24/21 1109  AST 22  ALT 18  ALKPHOS 41  BILITOT 1.7*  PROT 6.8  ALBUMIN 2.6*    No results for input(s): LIPASE, AMYLASE in the last 168 hours. No results for input(s): AMMONIA in the last 168 hours. Coagulation profile No results for input(s): INR, PROTIME in the last 168 hours. COVID-19 Labs  No results for input(s): DDIMER, FERRITIN, LDH, CRP in the last 72 hours.  Lab Results  Component Value Date   SARSCOV2NAA POSITIVE (A) 07/10/2021   [place] NEGATIVE 06/24/2021    CBC: Recent Labs  Lab 07/24/21 1109 07/25/21 0100  WBC 11.9* 12.0*  HGB 10.5* 10.1*  HCT 32.8* 31.3*  MCV 93.2 92.9  PLT 206 210    Cardiac Enzymes: No results for input(s): CKTOTAL, CKMB, CKMBINDEX, TROPONINI in the last 168 hours. BNP (last 3 results) No results for  input(s): PROBNP in the last 8760 hours. CBG: No results for input(s): GLUCAP in the last 168 hours. D-Dimer: No results for input(s): DDIMER in the last 72 hours. Hgb A1c: No results for input(s): HGBA1C in the last 72 hours. Lipid Profile: No results for input(s): CHOL, HDL, LDLCALC, TRIG, CHOLHDL, LDLDIRECT in the last 72 hours. Thyroid function studies: No results for input(s): TSH, T4TOTAL, T3FREE, THYROIDAB in the last 72 hours.  Invalid input(s): FREET3 Anemia work up: No results for input(s): VITAMINB12, FOLATE, FERRITIN, TIBC, IRON, RETICCTPCT in the last 72 hours. Sepsis Labs: Recent Labs  Lab 07/24/21 1109 07/25/21 0100 07/26/21 0333  PROCALCITON 0.58 2.09 2.00  WBC 11.9* 12.0*  --     Microbiology Recent Results (from the past 240 hour(s))  Culture, blood (routine x 2)     Status: Abnormal   Collection Time: 07/24/21  10:39 AM   Specimen: BLOOD  Result Value Ref Range Status   Specimen Description BLOOD LEFT ANTECUBITAL  Final   Special Requests   Final    BOTTLES DRAWN AEROBIC AND ANAEROBIC Blood Culture adequate volume   Culture  Setup Time   Final    GRAM POSITIVE COCCI IN CLUSTERS IN BOTH AEROBIC AND ANAEROBIC BOTTLES    Culture (A)  Final    STAPHYLOCOCCUS AUREUS SUSCEPTIBILITIES PERFORMED ON PREVIOUS CULTURE WITHIN THE LAST 5 DAYS. Performed at [facility], [place] [address], [place], [place] [address]    Report Status 07/27/2021 FINAL  Final  Culture, blood (routine x 2)     Status: Abnormal   Collection Time: 07/24/21 10:48 AM   Specimen: BLOOD  Result Value Ref Range Status   Specimen Description BLOOD RIGHT ANTECUBITAL  Final   Special Requests   Final    BOTTLES DRAWN AEROBIC AND ANAEROBIC Blood Culture adequate volume   Culture  Setup Time   Final    GRAM POSITIVE COCCI IN CLUSTERS IN BOTH AEROBIC AND ANAEROBIC BOTTLES CRITICAL RESULT CALLED TO, READ BACK BY AND VERIFIED WITH: L SEAY,PHARMD'@0629'$  07/25/21 [place] Performed at [facility], [place] [address], [place], [place] [address]    Culture STAPHYLOCOCCUS AUREUS (A)  Final   Report Status 07/27/2021 FINAL  Final   Organism ID, Bacteria STAPHYLOCOCCUS AUREUS  Final      Susceptibility   Staphylococcus aureus - MIC*    CIPROFLOXACIN <=0.5 SENSITIVE Sensitive     ERYTHROMYCIN <=0.25 SENSITIVE Sensitive     GENTAMICIN <=0.5 SENSITIVE Sensitive     OXACILLIN 0.5 SENSITIVE Sensitive     TETRACYCLINE <=1 SENSITIVE Sensitive     VANCOMYCIN 1 SENSITIVE Sensitive     TRIMETH/SULFA <=10 SENSITIVE Sensitive     CLINDAMYCIN <=0.25 SENSITIVE Sensitive     RIFAMPIN <=0.5 SENSITIVE Sensitive     Inducible Clindamycin NEGATIVE Sensitive     * STAPHYLOCOCCUS AUREUS  Blood Culture ID Panel (Reflexed)     Status: Abnormal   Collection Time: 07/24/21 10:48 AM  Result Value Ref Range Status   Enterococcus faecalis NOT DETECTED NOT DETECTED Final   Enterococcus Faecium NOT DETECTED NOT DETECTED Final   Listeria monocytogenes NOT DETECTED NOT DETECTED Final   Staphylococcus species DETECTED (A) NOT DETECTED Final    Comment: CRITICAL RESULT CALLED TO, READ BACK BY AND VERIFIED WITH: L SEAY,PHARMD'@0630'$  07/25/21 [place]    Staphylococcus aureus (BCID) DETECTED (A) NOT DETECTED Final    Comment: CRITICAL RESULT CALLED TO, READ BACK BY AND VERIFIED WITH: L SEAY,PHARMD'@0630'$  07/25/21 [place]    Staphylococcus epidermidis NOT DETECTED NOT DETECTED Final   Staphylococcus lugdunensis NOT DETECTED NOT DETECTED Final   Streptococcus species NOT DETECTED NOT DETECTED Final   Streptococcus agalactiae NOT DETECTED NOT DETECTED Final   Streptococcus pneumoniae NOT DETECTED NOT DETECTED Final   Streptococcus pyogenes NOT DETECTED NOT DETECTED Final   A.calcoaceticus-baumannii NOT DETECTED NOT DETECTED Final   Bacteroides fragilis NOT DETECTED NOT DETECTED Final   Enterobacterales NOT DETECTED NOT DETECTED Final   Enterobacter cloacae complex NOT DETECTED NOT DETECTED Final   Escherichia coli NOT  DETECTED NOT DETECTED Final   Klebsiella aerogenes NOT DETECTED NOT DETECTED Final   Klebsiella oxytoca NOT DETECTED NOT DETECTED Final   Klebsiella pneumoniae NOT DETECTED NOT DETECTED Final   Proteus species NOT DETECTED NOT DETECTED Final   Salmonella species NOT DETECTED NOT DETECTED Final   Serratia marcescens NOT DETECTED NOT DETECTED  Final   Haemophilus influenzae NOT DETECTED NOT DETECTED Final   Neisseria meningitidis NOT DETECTED NOT DETECTED Final   Pseudomonas aeruginosa NOT DETECTED NOT DETECTED Final   Stenotrophomonas maltophilia NOT DETECTED NOT DETECTED Final   Candida albicans NOT DETECTED NOT DETECTED Final   Candida auris NOT DETECTED NOT DETECTED Final   Candida glabrata NOT DETECTED NOT DETECTED Final   Candida krusei NOT DETECTED NOT DETECTED Final   Candida parapsilosis NOT DETECTED NOT DETECTED Final   Candida tropicalis NOT DETECTED NOT DETECTED Final   Cryptococcus neoformans/gattii NOT DETECTED NOT DETECTED Final   Meth resistant mecA/C and MREJ NOT DETECTED NOT DETECTED Final    Comment: Performed at [facility], [place] [address] [place], [place] [address]  Culture, blood (routine x 2)     Status: None (Preliminary result)   Collection Time: 07/25/21  8:06 AM   Specimen: BLOOD  Result Value Ref Range Status   Specimen Description BLOOD LEFT ANTECUBITAL  Final   Special Requests   Final    BOTTLES DRAWN AEROBIC AND ANAEROBIC Blood Culture adequate volume   Culture   Final    NO GROWTH 4 DAYS Performed at [facility], [place] [address], [place], [place] [address]    Report Status PENDING  Incomplete  Culture, blood (routine x 2)     Status: None (Preliminary result)   Collection Time: 07/25/21  8:06 AM   Specimen: BLOOD  Result Value Ref Range Status   Specimen Description BLOOD LEFT ANTECUBITAL  Final   Special Requests   Final    BOTTLES DRAWN AEROBIC AND ANAEROBIC Blood Culture adequate volume   Culture   Final    NO GROWTH 4  DAYS Performed at [facility], [place] [address], [place],  [address]    Report Status PENDING  Incomplete     Medications:    vitamin C  500 mg Oral Daily   benzonatate  100 mg Oral TID   enoxaparin (LOVENOX) injection  40 mg Subcutaneous QHS   feeding supplement  237 mL Oral TID BM   guaiFENesin  1,200 mg Oral BID   linezolid  600 mg Oral Q12H   mouth rinse  15 mL Mouth Rinse BID   metoprolol tartrate  12.5 mg Oral BID   multivitamin with minerals  1 tablet Oral Daily   pantoprazole  40 mg Oral BID   zinc sulfate  220 mg Oral Daily   Continuous Infusions:  sodium chloride 500 mL (07/11/21 1647)      LOS: 35 days   Charlynne Cousins  [facility]  07/30/2021, 8:39 AM

## 2021-07-31 MED ORDER — IPRATROPIUM-ALBUTEROL 0.5-2.5 (3) MG/3ML IN SOLN
3.0000 mL | RESPIRATORY_TRACT | Status: DC | PRN
  Administered 2021-07-31 – 2021-08-02 (×3): 3 mL via RESPIRATORY_TRACT
  Filled 2021-07-31 (×3): qty 3

## 2021-07-31 NOTE — Progress Notes (Signed)
Contacted Infection prevention regarding patients isolation precautions. She stated that she was outside of the window of needing precautions at this time. Bed side RN made aware. Karthika Glasper, Bettina Gavia RN

## 2021-07-31 NOTE — Progress Notes (Signed)
RT called to bedside to assess pt breathing due to pt complaining of SOB. Pt was on 100% NRB mask upon entering the room. O2 was switched to 6L [place] and PRN neb given. Pt states her breathing is feeling better. RN made aware.

## 2021-07-31 NOTE — TOC Progression Note (Signed)
Transition of Care [facility]) - Progression Note    Patient Details  Name: Mary Strickland MRN: [id number] Date of Birth: [birth year]  Transition of Care [facility]) CM/SW [place], [place] Phone Number: 07/31/2021, 11:04 AM  Clinical Narrative:     Per MD- patient is medically stable for discharge- CSW contacted PT and informed -SNF will need updated PT note to start  insurance authorization. SNF [facility] ) will start authorization once updated clinicals are faxed to [phone]. Patient has [employer] and can take 2-3 Business days for determination.  Patient was denied short term rehab 07/21/2021 and family was going to private pay- however, family has requested to seek authorization again.  CSW contacted transport services: CSW called [employer] [phone] ($ [phone]) - they will not have availably until Thursday 08/03/2021- CSW schedule Transport on 08/03/2021 @ 12. [facility] [phone]) No availability until [facility] or [facility] [phone] ($2700.00) available next day   CSW has updated Family, SNF and RN.   CSW will continue to follow and assist with discharge planning.  Thurmond Butts, MSW, LCSW Clinical Social Worker   Expected Discharge Plan: Home/Self Care Barriers to Discharge: Barriers Resolved  Expected Discharge Plan and Services Expected Discharge Plan: Home/Self Care In-house Referral: Clinical Social Work     Living arrangements for the past 2 months: Single Family Home Expected Discharge Date: 07/07/21                                     Social Determinants of Health (SDOH) Interventions    Readmission Risk Interventions No flowsheet data found.

## 2021-07-31 NOTE — TOC Progression Note (Signed)
Transition of Care [facility]) - Progression Note    Patient Details  Name: Mary Strickland MRN: [id number] Date of Birth: 16-Oct-1930  Transition of Care [facility]) CM/SW [place], [place] Phone Number: 07/31/2021, 3:52 PM  Clinical Narrative:     Faxed updated clinicals to [employer] will start insurance authorization once received.   Thurmond Butts, MSW, LCSW Clinical Social Worker    Expected Discharge Plan: Home/Self Care Barriers to Discharge: Barriers Resolved  Expected Discharge Plan and Services Expected Discharge Plan: Home/Self Care In-house Referral: Clinical Social Work     Living arrangements for the past 2 months: Single Family Home Expected Discharge Date: 07/07/21                                     Social Determinants of Health (SDOH) Interventions    Readmission Risk Interventions No flowsheet data found.

## 2021-07-31 NOTE — Progress Notes (Deleted)
{Choose 1 Note Type (Video or Telephone):[id number]}    Date:  07/31/2021   ID:  Mary Strickland, DOB [birth year], MRN [id number] The patient was identified using 2 identifiers.  {Patient [phone]::"Home"} {Provider [phone]::"Home Office"}   PCP:  Burnard Bunting, MD   [place] Providers Cardiologist:  Neri Vieyra Martinique, MD { Click to update primary MD,subspecialty MD or APP then REFRESH:1}    Evaluation Performed:  Follow-Up Visit  Chief Complaint:  HTN, weakness  History of Present Illness:    Mary Strickland is a 87 y.o. female with history of palpitations.  She also has a history of hypertension and hyperlipidemia. Prior echocardiogram in 2009 was unremarkable.   Was seen in Feb with complaints of weakness on Coreg. This was stopped and she was placed on valsartan and amlodipine.   The patient {does/does not:200015} have symptoms concerning for COVID-19 infection (fever, chills, cough, or new shortness of breath).    Past Medical History:  Diagnosis Date   Anxiety    Back pain    Blood loss anemia    Mild postoperative blood loss anemia   Dyslipidemia    GERD (gastroesophageal reflux disease)    History of diverticulitis of colon    Hypertension    Idiopathic scoliosis    Lumbosacral spondylosis    Mitral valve prolapse    Neuroma, Morton's    both feet,surgery done   Palpitations    hx. of   Psychosis (HCC)    Right carotid bruit    Ulcer    History of ulcers   Past Surgical History:  Procedure Laterality Date   cataracts     both eyes   EXCISION MORTON'S NEUROMA     both feet   LUMBAR FUSION     OTHER SURGICAL HISTORY     hysterectomy   OTHER SURGICAL HISTORY     left breast biopsy/lymph node   TOTAL KNEE ARTHROPLASTY  October 2002   right     No outpatient medications have been marked as taking for the 08/02/21 encounter (Appointment) with Martinique, Malaina Mortellaro M, MD.     Allergies:   Sulfamethoxazole-trimethoprim,  Percocet [oxycodone-acetaminophen], and Zocor [simvastatin]   Social History   Tobacco Use   Smoking status: Former   Smokeless tobacco: Never  Vaping Use   Vaping Use: Never used  Substance Use Topics   Alcohol use: No   Drug use: No     Family Hx: The patient's family history includes Heart attack in her father; Heart disease in her father and mother. There is no history of Breast cancer.  ROS:   Please see the history of present illness.    *** All other systems reviewed and are negative.   Prior CV studies:   The following studies were reviewed today:  ***  Labs/Other Tests and Data Reviewed:    EKG:  {EKG/Telemetry Strips Reviewed:[phone]}  Recent Labs: 07/06/2021: B Natriuretic Peptide 911.0 07/10/2021: Magnesium 2.0 07/24/2021: ALT 18 07/25/2021: Hemoglobin 10.1; Platelets 210 07/27/2021: BUN 24; Creatinine, Ser 0.93; Potassium 4.6; Sodium 134   Recent Lipid Panel Lab Results  Component Value Date/Time   TRIG 91 06/25/2021 01:15 AM    Wt Readings from Last 3 Encounters:  07/31/21 174 lb 13 oz (79.3 kg)  01/26/21 162 lb 9.6 oz (73.8 kg)  01/18/21 162 lb 3.2 oz (73.6 kg)     Risk Assessment/Calculations:   {Does this patient have ATRIAL FIBRILLATION?:[phone]}      Objective:    Vital Signs:  There  were no vitals taken for this visit.   {[facility]Care Virtual Exam (Optional):[phone]::"VITAL SIGNS:  reviewed"}  ASSESSMENT & PLAN:    ***   {Are you ordering a CV Procedure (e.g. stress test, cath, DCCV, TEE, etc)?   Press F2        :[id number]    COVID-19 Education: The signs and symptoms of COVID-19 were discussed with the patient and how to seek care for testing (follow up with PCP or arrange E-visit).  ***The importance of social distancing was discussed today.  Time:   Today, I have spent *** minutes with the patient with telehealth technology discussing the above problems.     Medication Adjustments/Labs and Tests Ordered: Current  medicines are reviewed at length with the patient today.  Concerns regarding medicines are outlined above.   Tests Ordered: No orders of the defined types were placed in this encounter.   Medication Changes: No orders of the defined types were placed in this encounter.   Follow Up:  {F/U [phone]} {follow up:15908}  Signed, Skarlet Lyons Martinique, MD  07/31/2021 9:33 AM    [facility]

## 2021-07-31 NOTE — Progress Notes (Signed)
Physical Therapy Treatment Patient Details Name: Mary Strickland MRN: [id number] DOB: 1930/07/04 Today's Date: 07/31/2021    History of Present Illness Pt is 87 y.o. female presents to [place] [facility] ED on 06/24/2021 with sudden onset hemoptysis. Pt experienced a bout of hypoxia in ED with AMS and was intubated on 7/23. Pt was transported to [facility] hospital on 7/24, found to have pneumomediastinum, pneumothorax. Chest tube inserted 7/24. Pt extubated on 7/25. Now covid+ as of 07/10/21. PMH includes history of smoking, hypertension, mitral valve prolapse.    PT Comments    Pt willing to work with PT and making gradual progress.  She ambulated 12'x2 today with min A but limited further due to decreased O2 sats and coughing.  Required increased time for rest breaks, vital monitoring.  Frequent cues for breathing in nose.  Pt on 3 L with sats 90%; ambulated to [facility] sats down to 83% so increased to 4 L and took 3 mins to recover to 90%.  Ambulate back to bed sats 81% upon return to supine and head elevated.  Pt with increased coughing and able to expel thick sputum.  Placed on 5 L O2 and sats up to 88% within 2 mins then 89-90% after 5-6 minutes.  Left on 5 L and notified RN.    Follow Up Recommendations  SNF;Supervision/Assistance - 24 hour     Equipment Recommendations  Rolling walker with 5" wheels;3in1 (PT)    Recommendations for Other Services       Precautions / Restrictions Precautions Precautions: Fall Precaution Comments: monitor sats    Mobility  Bed Mobility Overal bed mobility: Needs Assistance Bed Mobility: Rolling;Sidelying to Sit Rolling: Supervision Sidelying to sit: Min assist       General bed mobility comments: from flat HOB, use of rails    Transfers Overall transfer level: Needs assistance Equipment used: Rolling walker (2 wheeled) Transfers: Sit to/from Stand Sit to Stand: Min assist         General transfer comment: Performed from bed and BSC with min A  to rise, increased time, cues for hand placement  Ambulation/Gait Ambulation/Gait assistance: Min assist Gait Distance (Feet): 12 Feet (12'x2) Assistive device: Rolling walker (2 wheeled) Gait Pattern/deviations: Step-to pattern;Decreased stride length     General Gait Details: Cues for RW proximity; Limited due to resp status (see general comments)   Stairs             Wheelchair Mobility    Modified Rankin (Stroke Patients Only)       Balance Overall balance assessment: Needs assistance Sitting-balance support: Feet supported;No upper extremity supported Sitting balance-[name] Scale: Fair     Standing balance support: Bilateral upper extremity supported;During functional activity Standing balance-[name] Scale: Poor Standing balance comment: requiring RW                            Cognition Arousal/Alertness: Awake/alert Behavior During Therapy: WFL for tasks assessed/performed Overall Cognitive Status: Within Functional Limits for tasks assessed                                 General Comments: Overall seemed WFL: some limitations/repetitions due to very HOH and pt speaking in low volume due to sore throat      Exercises      General Comments General comments (skin integrity, edema, etc.): Pt on 3 L with sats 90%; ambulated to  BSC sats down to 83% so increased to 4 L and took 3 mins to recover to 90%.  Ambulate back to bed sats 81% upon return to supine and head elevated.  Pt with increased coughing and able to expel thick sputum.  Placed on 5 L O2 and sats up to 88% within 2 mins then 89-90% after 5-6 minutes.  Left on 5 L and notified RN.      Pertinent Vitals/Pain Pain Assessment: Faces Faces Pain Scale: Hurts little more Pain Location: lower chest (under breast) with coughing but eased when back in bed Pain Descriptors / Indicators: Grimacing Pain Intervention(s): Limited activity within patient's tolerance;Monitored during  session;Repositioned    Home Living                      Prior Function            PT Goals (current goals can now be found in the care plan section) Acute Rehab PT Goals Patient Stated Goal: go home PT Goal Formulation: With patient Time For Goal Achievement: 08/14/21 Potential to Achieve Goals: Good Progress towards PT goals: Progressing toward goals    Frequency    Min 2X/week      PT Plan Current plan remains appropriate    Co-evaluation              AM-PAC PT "6 Clicks" Mobility   Outcome Measure  Help needed turning from your back to your side while in a flat bed without using bedrails?: A Little Help needed moving from lying on your back to sitting on the side of a flat bed without using bedrails?: A Little Help needed moving to and from a bed to a chair (including a wheelchair)?: A Little Help needed standing up from a chair using your arms (e.g., wheelchair or bedside chair)?: A Little Help needed to walk in hospital room?: A Little Help needed climbing 3-5 steps with a railing? : A Lot 6 Click Score: 17    End of Session Equipment Utilized During Treatment: Gait belt;Oxygen Activity Tolerance: Patient tolerated treatment well Patient left: with call bell/phone within reach;in bed;with bed alarm set Nurse Communication: Mobility status (O2 need) PT Visit Diagnosis: Muscle weakness (generalized) (M62.81);Other abnormalities of gait and mobility (R26.89);Difficulty in walking, not elsewhere classified (R26.2)     Time: 1130-1206 PT Time Calculation (min) (ACUTE ONLY): 36 min  Charges:  $Gait Training: 8-22 mins $Therapeutic Activity: 8-22 mins                     Mary Strickland, PT Acute Rehab Services Pager [phone] [facility] [place] 07/31/2021, 12:34 PM

## 2021-07-31 NOTE — Progress Notes (Signed)
[facility] PROGRESS NOTE    Progress Note  Mary Strickland  [id number] DOB: 20-Jan-1930 DOA: 06/24/2021 PCP: Burnard Bunting, MD     Brief Narrative:   Mary Strickland is an 87 y.o. female past medical history of tobacco abuse, essential hypertension came in complaining of hemoptysis severe hypoxia requiring endotracheal intubation.  Was found to have a pneumomediastinum with a more thorax with chest tube placement.  Her hemoptysis was treated conservatively patient was initially transferred to comfort measures with a chest tube removed.  During her admission she was also treated for pneumonia thought to be COVID-19, developed acute urinary retention.  Prior to second attempt to discharge patient developed fevers BC ID was significant for staph aureus MSSA.  Patient is medically stable to be transferred to skilled nursing facility  Assessment/Plan:   Acute respiratory failure with hypoxia likely secondary to COVID-19 pneumonia/multifocal pneumonia: Treated empirically with antibiotics for which she completed her course in-house. She also completed her course of remdesivir and Decadron. Out of bed to chair and weaned to room air. Physical therapy recommended skilled nursing facility.  Left pneumothorax/pneumomediastinum/hemoptysis: Aspirin discontinued she required chest tube from 06/26/2021 to 06/29/2021 no further hemoptysis or intervention.  MSSA bacteremia: Likely source respiratory tract infection which required intubation. Sputum cultures have been negative till date. Blood culture grew MSSA, surveillance blood cultures have been negative till date. ID recommended to switch to oral Linezolid 08/09/2019  Acute urinary retention: Foley was placed on 07/10/2021 discontinued on 07/19/2021 now resolved. Continue strict I's and O's.  Elevated troponins likely due to demand ischemia: Cardiology was consulted TEE showed no wall motion abnormalities preserved EF,  cardiology signed off on 06/30/2021 secondary to decision of patient transitioning to comfort measures which is not the case at this point in time. Will need an outpatient follow-up with cardiology.  Malpositioned left IJ: CT neck without focal vascular injuries now discontinued.  Essential hypertension: Continue current antihypertensive medication blood pressure seems to be well controlled.   DVT prophylaxis: lovenox Family Communication:none Status is: Inpatient  Remains inpatient appropriate because:Hemodynamically unstable  Dispo: The patient is from: Home              Anticipated d/c is to: Home              Patient currently is not medically stable to d/c.   Difficult to place patient No   Code Status:     Code Status Orders  (From admission, onward)           Start     Ordered   06/25/21 0354  Do not attempt resuscitation (DNR)  Continuous       Question Answer Comment  In the event of cardiac or respiratory ARREST Do not call a "code blue"   In the event of cardiac or respiratory ARREST Do not perform Intubation, CPR, defibrillation or ACLS   In the event of cardiac or respiratory ARREST Use medication by any route, position, wound care, and other measures to relive pain and suffering. May use oxygen, suction and manual treatment of airway obstruction as needed for comfort.      06/25/21 0353           Code Status History     Date Active Date Inactive Code Status Order ID Comments User Context   06/25/2021 0314 06/25/2021 0353 Full Code [id number]  Renee Pain, MD ED         IV Access:   Peripheral IV  Procedures and diagnostic studies:   No results found.   Medical Consultants:   None.   Subjective:    Mary Strickland no complaints  Objective:    Vitals:   07/30/21 0901 07/30/21 1315 07/30/21 1749 07/31/21 0325  BP: 136/62 (!) 152/50 (!) 144/67   Pulse: 92 90 (!) 107   Resp: 20     Temp: 98.4 F (36.9 C) 97.6 F (36.4  C) 97.6 F (36.4 C)   TempSrc: Oral Oral Oral   SpO2: 98% 90% 91%   Weight:    79.3 kg  Height:       SpO2: 91 % O2 Flow Rate (L/min): 3 L/min FiO2 (%): 40 %   Intake/Output Summary (Last 24 hours) at 07/31/2021 0844 Last data filed at 07/30/2021 1000 Gross per 24 hour  Intake 240 ml  Output --  Net 240 ml    Filed Weights   07/28/21 0411 07/29/21 0608 07/31/21 0325  Weight: 78.9 kg 78.9 kg 79.3 kg    Exam: General exam: In no acute distress. Respiratory system: Good air movement and clear to auscultation. Cardiovascular system: S1 & S2 heard, RRR. No JVD. Gastrointestinal system: Abdomen is nondistended, soft and nontender.  Extremities: No pedal edema. Skin: No rashes, lesions or ulcers  Data Reviewed:    Labs: Basic Metabolic Panel: Recent Labs  Lab 07/24/21 1109 07/25/21 0807 07/27/21 0148  NA 131* 133* 134*  K 4.1 4.3 4.6  CL 96* 98 102  CO2 '23 26 25  '$ GLUCOSE 96 94 94  BUN 43* 47* 24*  CREATININE 1.25* 1.46* 0.93  CALCIUM 8.6* 8.3* 8.3*    GFR Estimated Creatinine Clearance: 40.8 mL/min (by C-G formula based on SCr of 0.93 mg/dL). Liver Function Tests: Recent Labs  Lab 07/24/21 1109  AST 22  ALT 18  ALKPHOS 41  BILITOT 1.7*  PROT 6.8  ALBUMIN 2.6*    No results for input(s): LIPASE, AMYLASE in the last 168 hours. No results for input(s): AMMONIA in the last 168 hours. Coagulation profile No results for input(s): INR, PROTIME in the last 168 hours. COVID-19 Labs  No results for input(s): DDIMER, FERRITIN, LDH, CRP in the last 72 hours.  Lab Results  Component Value Date   SARSCOV2NAA POSITIVE (A) 07/10/2021   [place] NEGATIVE 06/24/2021    CBC: Recent Labs  Lab 07/24/21 1109 07/25/21 0100  WBC 11.9* 12.0*  HGB 10.5* 10.1*  HCT 32.8* 31.3*  MCV 93.2 92.9  PLT 206 210    Cardiac Enzymes: No results for input(s): CKTOTAL, CKMB, CKMBINDEX, TROPONINI in the last 168 hours. BNP (last 3 results) No results for input(s):  PROBNP in the last 8760 hours. CBG: No results for input(s): GLUCAP in the last 168 hours. D-Dimer: No results for input(s): DDIMER in the last 72 hours. Hgb A1c: No results for input(s): HGBA1C in the last 72 hours. Lipid Profile: No results for input(s): CHOL, HDL, LDLCALC, TRIG, CHOLHDL, LDLDIRECT in the last 72 hours. Thyroid function studies: No results for input(s): TSH, T4TOTAL, T3FREE, THYROIDAB in the last 72 hours.  Invalid input(s): FREET3 Anemia work up: No results for input(s): VITAMINB12, FOLATE, FERRITIN, TIBC, IRON, RETICCTPCT in the last 72 hours. Sepsis Labs: Recent Labs  Lab 07/24/21 1109 07/25/21 0100 07/26/21 0333  PROCALCITON 0.58 2.09 2.00  WBC 11.9* 12.0*  --     Microbiology Recent Results (from the past 240 hour(s))  Culture, blood (routine x 2)     Status: Abnormal   Collection Time:  07/24/21 10:39 AM   Specimen: BLOOD  Result Value Ref Range Status   Specimen Description BLOOD LEFT ANTECUBITAL  Final   Special Requests   Final    BOTTLES DRAWN AEROBIC AND ANAEROBIC Blood Culture adequate volume   Culture  Setup Time   Final    GRAM POSITIVE COCCI IN CLUSTERS IN BOTH AEROBIC AND ANAEROBIC BOTTLES    Culture (A)  Final    STAPHYLOCOCCUS AUREUS SUSCEPTIBILITIES PERFORMED ON PREVIOUS CULTURE WITHIN THE LAST 5 DAYS. Performed at [facility], [place] [address] [place], [place] [address]    Report Status 07/27/2021 FINAL  Final  Culture, blood (routine x 2)     Status: Abnormal   Collection Time: 07/24/21 10:48 AM   Specimen: BLOOD  Result Value Ref Range Status   Specimen Description BLOOD RIGHT ANTECUBITAL  Final   Special Requests   Final    BOTTLES DRAWN AEROBIC AND ANAEROBIC Blood Culture adequate volume   Culture  Setup Time   Final    GRAM POSITIVE COCCI IN CLUSTERS IN BOTH AEROBIC AND ANAEROBIC BOTTLES CRITICAL RESULT CALLED TO, READ BACK BY AND VERIFIED WITH: L SEAY,PHARMD'@0629'$  07/25/21 [place] Performed at [facility],  [place] [address], [place], [place] [address]    Culture STAPHYLOCOCCUS AUREUS (A)  Final   Report Status 07/27/2021 FINAL  Final   Organism ID, Bacteria STAPHYLOCOCCUS AUREUS  Final      Susceptibility   Staphylococcus aureus - MIC*    CIPROFLOXACIN <=0.5 SENSITIVE Sensitive     ERYTHROMYCIN <=0.25 SENSITIVE Sensitive     GENTAMICIN <=0.5 SENSITIVE Sensitive     OXACILLIN 0.5 SENSITIVE Sensitive     TETRACYCLINE <=1 SENSITIVE Sensitive     VANCOMYCIN 1 SENSITIVE Sensitive     TRIMETH/SULFA <=10 SENSITIVE Sensitive     CLINDAMYCIN <=0.25 SENSITIVE Sensitive     RIFAMPIN <=0.5 SENSITIVE Sensitive     Inducible Clindamycin NEGATIVE Sensitive     * STAPHYLOCOCCUS AUREUS  Blood Culture ID Panel (Reflexed)     Status: Abnormal   Collection Time: 07/24/21 10:48 AM  Result Value Ref Range Status   Enterococcus faecalis NOT DETECTED NOT DETECTED Final   Enterococcus Faecium NOT DETECTED NOT DETECTED Final   Listeria monocytogenes NOT DETECTED NOT DETECTED Final   Staphylococcus species DETECTED (A) NOT DETECTED Final    Comment: CRITICAL RESULT CALLED TO, READ BACK BY AND VERIFIED WITH: L SEAY,PHARMD'@0630'$  07/25/21 [place]    Staphylococcus aureus (BCID) DETECTED (A) NOT DETECTED Final    Comment: CRITICAL RESULT CALLED TO, READ BACK BY AND VERIFIED WITH: L SEAY,PHARMD'@0630'$  07/25/21 [place]    Staphylococcus epidermidis NOT DETECTED NOT DETECTED Final   Staphylococcus lugdunensis NOT DETECTED NOT DETECTED Final   Streptococcus species NOT DETECTED NOT DETECTED Final   Streptococcus agalactiae NOT DETECTED NOT DETECTED Final   Streptococcus pneumoniae NOT DETECTED NOT DETECTED Final   Streptococcus pyogenes NOT DETECTED NOT DETECTED Final   A.calcoaceticus-baumannii NOT DETECTED NOT DETECTED Final   Bacteroides fragilis NOT DETECTED NOT DETECTED Final   Enterobacterales NOT DETECTED NOT DETECTED Final   Enterobacter cloacae complex NOT DETECTED NOT DETECTED Final   Escherichia coli NOT DETECTED NOT  DETECTED Final   Klebsiella aerogenes NOT DETECTED NOT DETECTED Final   Klebsiella oxytoca NOT DETECTED NOT DETECTED Final   Klebsiella pneumoniae NOT DETECTED NOT DETECTED Final   Proteus species NOT DETECTED NOT DETECTED Final   Salmonella species NOT DETECTED NOT DETECTED Final   Serratia marcescens NOT DETECTED NOT  DETECTED Final   Haemophilus influenzae NOT DETECTED NOT DETECTED Final   Neisseria meningitidis NOT DETECTED NOT DETECTED Final   Pseudomonas aeruginosa NOT DETECTED NOT DETECTED Final   Stenotrophomonas maltophilia NOT DETECTED NOT DETECTED Final   Candida albicans NOT DETECTED NOT DETECTED Final   Candida auris NOT DETECTED NOT DETECTED Final   Candida glabrata NOT DETECTED NOT DETECTED Final   Candida krusei NOT DETECTED NOT DETECTED Final   Candida parapsilosis NOT DETECTED NOT DETECTED Final   Candida tropicalis NOT DETECTED NOT DETECTED Final   Cryptococcus neoformans/gattii NOT DETECTED NOT DETECTED Final   Meth resistant mecA/C and MREJ NOT DETECTED NOT DETECTED Final    Comment: Performed at [facility], [place] [address] [place], [place] [address]  Culture, blood (routine x 2)     Status: None   Collection Time: 07/25/21  8:06 AM   Specimen: BLOOD  Result Value Ref Range Status   Specimen Description BLOOD LEFT ANTECUBITAL  Final   Special Requests   Final    BOTTLES DRAWN AEROBIC AND ANAEROBIC Blood Culture adequate volume   Culture   Final    NO GROWTH 5 DAYS Performed at [facility], [place] [address] [place], [place] [address]    Report Status 07/30/2021 FINAL  Final  Culture, blood (routine x 2)     Status: None   Collection Time: 07/25/21  8:06 AM   Specimen: BLOOD  Result Value Ref Range Status   Specimen Description BLOOD LEFT ANTECUBITAL  Final   Special Requests   Final    BOTTLES DRAWN AEROBIC AND ANAEROBIC Blood Culture adequate volume   Culture   Final    NO GROWTH 5 DAYS Performed at [facility], [place] [address]  [place], [place] [address]    Report Status 07/30/2021 FINAL  Final     Medications:    vitamin C  500 mg Oral Daily   benzonatate  100 mg Oral TID   enoxaparin (LOVENOX) injection  40 mg Subcutaneous QHS   feeding supplement  237 mL Oral TID BM   guaiFENesin  1,200 mg Oral BID   linezolid  600 mg Oral Q12H   mouth rinse  15 mL Mouth Rinse BID   metoprolol tartrate  12.5 mg Oral BID   multivitamin with minerals  1 tablet Oral Daily   pantoprazole  40 mg Oral BID   zinc sulfate  220 mg Oral Daily   Continuous Infusions:  sodium chloride 500 mL (07/11/21 1647)      LOS: 36 days   Charlynne Cousins  [facility]  07/31/2021, 8:44 AM

## 2021-08-01 ENCOUNTER — Inpatient Hospital Stay (HOSPITAL_COMMUNITY): Payer: Medicare HMO

## 2021-08-01 LAB — CBC WITH DIFFERENTIAL/PLATELET
Abs Immature Granulocytes: 0.12 10*3/uL — ABNORMAL HIGH (ref 0.00–0.07)
Basophils Absolute: 0.1 10*3/uL (ref 0.0–0.1)
Basophils Relative: 1 %
Eosinophils Absolute: 0.1 10*3/uL (ref 0.0–0.5)
Eosinophils Relative: 1 %
HCT: 27.4 % — ABNORMAL LOW (ref 36.0–46.0)
Hemoglobin: 8.9 g/dL — ABNORMAL LOW (ref 12.0–15.0)
Immature Granulocytes: 1 %
Lymphocytes Relative: 11 %
Lymphs Abs: 1.3 10*3/uL (ref 0.7–4.0)
MCH: 30.2 pg (ref 26.0–34.0)
MCHC: 32.5 g/dL (ref 30.0–36.0)
MCV: 92.9 fL (ref 80.0–100.0)
Monocytes Absolute: 0.8 10*3/uL (ref 0.1–1.0)
Monocytes Relative: 7 %
Neutro Abs: 9.2 10*3/uL — ABNORMAL HIGH (ref 1.7–7.7)
Neutrophils Relative %: 79 %
Platelets: 299 10*3/uL (ref 150–400)
RBC: 2.95 MIL/uL — ABNORMAL LOW (ref 3.87–5.11)
RDW: 15.9 % — ABNORMAL HIGH (ref 11.5–15.5)
WBC: 11.6 10*3/uL — ABNORMAL HIGH (ref 4.0–10.5)
nRBC: 0 % (ref 0.0–0.2)

## 2021-08-01 MED ORDER — SODIUM CHLORIDE 0.9 % IV SOLN
1.5000 g | Freq: Four times a day (QID) | INTRAVENOUS | Status: DC
  Administered 2021-08-01 – 2021-08-03 (×9): 1.5 g via INTRAVENOUS
  Filled 2021-08-01 (×11): qty 4

## 2021-08-01 NOTE — Progress Notes (Signed)
Pt is alert, oriented x 3,but forgetful. She has been repeatedly asking when she will transfer to SNF in [place]. She keeps asking staff to call her daughter and people at the church to come and help her getting out of the hospital tonight.  Pt was reoriented and reassured that our care team already organized her transferring on coming Thursday.   She has persistent productive cough with wheezing and with yellowish sputum. Breathing treatment was given. Her SPO2 78-96% RT titrated HFNCL from 3-8 LPM to keep her SPO2 above 92%. Encouraged breathing exercise with flutter valve. She remains afebrile, BP stable, regular pulse rate. Pt is not on cardiac monitoring. We will continue to monitor.  Kennyth Lose, RN

## 2021-08-01 NOTE — Progress Notes (Signed)
OT Cancellation Note  Patient Details Name: Mary Strickland MRN: [id number] DOB: 1930-06-10   Cancelled Treatment:    Reason Eval/Treat Not Completed: Patient declined, no reason specified;Other (comment) On OT attempts, pt eating lunch. Will follow-up for OT session as schedule permits.   Layla Maw 08/01/2021, 2:39 PM

## 2021-08-01 NOTE — Care Management Important Message (Signed)
Important Message  Patient Details  Name: Mary Strickland MRN: [id number] Date of Birth: [birth year]   Medicare Important Message Given:  Yes     [name] 08/01/2021, 9:50 AM

## 2021-08-01 NOTE — TOC Progression Note (Signed)
Transition of Care [facility]) - Progression Note    Patient Details  Name: Mary Strickland MRN: [id number] Date of Birth: 07-08-1930  Transition of Care [facility] CM/SW [place], [place] Phone Number: 08/01/2021, 12:44 PM  Clinical Narrative:     Diane w/ [facility] # [phone] they received faxed clincials and has submitted authorization.   TOC will continue to follow and assist with discharge planning.   Thurmond Butts, MSW, LCSW Clinical Social Worker    Expected Discharge Plan: Home/Self Care Barriers to Discharge: Barriers Resolved  Expected Discharge Plan and Services Expected Discharge Plan: Home/Self Care In-house Referral: Clinical Social Work     Living arrangements for the past 2 months: Single Family Home Expected Discharge Date: 07/07/21                                     Social Determinants of Health (SDOH) Interventions    Readmission Risk Interventions No flowsheet data found.

## 2021-08-01 NOTE — Progress Notes (Signed)
Patient stated she was short of breath. O2 at 93%. Increased 3L HFNC to 4L HFNC. Notified respiratory.   Daymon Larsen, RN

## 2021-08-01 NOTE — Progress Notes (Signed)
[facility] PROGRESS NOTE    Progress Note  Mary Strickland  [id number] DOB: [birth year] DOA: 06/24/2021 PCP: Burnard Bunting, MD     Brief Narrative:   Mary Strickland is an 87 y.o. female past medical history of tobacco abuse, essential hypertension came in complaining of hemoptysis severe hypoxia requiring endotracheal intubation.  Was found to have a pneumomediastinum with a more thorax with chest tube placement.  Her hemoptysis was treated conservatively patient was initially transferred to comfort measures with a chest tube removed.  During her admission she was also treated for pneumonia thought to be COVID-19, developed acute urinary retention.  Prior to second attempt to discharge patient developed fevers BC ID was significant for staph aureus MSSA.  Patient is medically stable to be transferred to skilled nursing facility  Assessment/Plan:   Acute respiratory failure with hypoxia likely secondary to COVID-19 pneumonia/multifocal pneumonia: Treated empirically with antibiotics for which she completed her course in-house. She also completed her course of remdesivir and Decadron. She started developing persistent cough which is productive requiring more oxygen, this morning she is on 5 L high flow nasal cannula We will go ahead and add IV Unasyn, check a CBC with differential. Check a CT of the chest without contrast. Out of bed to chair and weaned to room air. Physical therapy recommended skilled nursing facility.  Left pneumothorax/pneumomediastinum/hemoptysis: Aspirin discontinued she required chest tube from 06/26/2021 to 06/29/2021 no further hemoptysis or intervention.  MSSA bacteremia: Likely source respiratory tract infection which required intubation. Sputum cultures have been negative till date. Blood culture grew MSSA, surveillance blood cultures have been negative till date. ID recommended to switch to oral Linezolid 08/09/2019  Acute urinary  retention: Foley was placed on 07/10/2021 discontinued on 07/19/2021 now resolved. Continue strict I's and O's.  Elevated troponins likely due to demand ischemia: Cardiology was consulted TEE showed no wall motion abnormalities preserved EF, cardiology signed off on 06/30/2021 secondary to decision of patient transitioning to comfort measures which is not the case at this point in time. Will need an outpatient follow-up with cardiology.  Malpositioned left IJ: CT neck without focal vascular injuries now discontinued.  Essential hypertension: Continue current antihypertensive medication blood pressure seems to be well controlled.   DVT prophylaxis: lovenox Family Communication:none Status is: Inpatient  Remains inpatient appropriate because:Hemodynamically unstable  Dispo: The patient is from: Home              Anticipated d/c is to: Home              Patient currently is not medically stable to d/c.   Difficult to place patient No   Code Status:     Code Status Orders  (From admission, onward)           Start     Ordered   06/25/21 0354  Do not attempt resuscitation (DNR)  Continuous       Question Answer Comment  In the event of cardiac or respiratory ARREST Do not call a "code blue"   In the event of cardiac or respiratory ARREST Do not perform Intubation, CPR, defibrillation or ACLS   In the event of cardiac or respiratory ARREST Use medication by any route, position, wound care, and other measures to relive pain and suffering. May use oxygen, suction and manual treatment of airway obstruction as needed for comfort.      06/25/21 0353           Code Status History  Date Active Date Inactive Code Status Order ID Comments User Context   06/25/2021 0314 06/25/2021 0353 Full Code [id number]  Renee Pain, MD ED         IV Access:   Peripheral IV   Procedures and diagnostic studies:   No results found.   Medical Consultants:    None.   Subjective:    Mary Strickland she denies any coughing when she eats or drinks but she does relate productive persistent cough  Objective:    Vitals:   07/31/21 2316 08/01/21 0352 08/01/21 0400 08/01/21 0720  BP: 139/79 131/81  126/76  Pulse: (!) 103 99  93  Resp: '20 20  18  '$ Temp:  98.2 F (36.8 C)  98.5 F (36.9 C)  TempSrc:  Oral  Oral  SpO2: 99% 100%  100%  Weight:   76.2 kg   Height:       SpO2: 100 % O2 Flow Rate (L/min): 5 L/min FiO2 (%): 40 %   Intake/Output Summary (Last 24 hours) at 08/01/2021 0815 Last data filed at 07/31/2021 1947 Gross per 24 hour  Intake 1090 ml  Output --  Net 1090 ml    Filed Weights   07/29/21 0608 07/31/21 0325 08/01/21 0400  Weight: 78.9 kg 79.3 kg 76.2 kg    Exam: General exam: In no acute distress. Respiratory system: Good air movement and clear to auscultation. Cardiovascular system: S1 & S2 heard, RRR. No JVD. Gastrointestinal system: Abdomen is nondistended, soft and nontender.  Extremities: No pedal edema. Skin: No rashes, lesions or ulcers  Data Reviewed:    Labs: Basic Metabolic Panel: Recent Labs  Lab 07/27/21 0148  NA 134*  K 4.6  CL 102  CO2 25  GLUCOSE 94  BUN 24*  CREATININE 0.93  CALCIUM 8.3*    GFR Estimated Creatinine Clearance: 39.9 mL/min (by C-G formula based on SCr of 0.93 mg/dL). Liver Function Tests: No results for input(s): AST, ALT, ALKPHOS, BILITOT, PROT, ALBUMIN in the last 168 hours.  No results for input(s): LIPASE, AMYLASE in the last 168 hours. No results for input(s): AMMONIA in the last 168 hours. Coagulation profile No results for input(s): INR, PROTIME in the last 168 hours. COVID-19 Labs  No results for input(s): DDIMER, FERRITIN, LDH, CRP in the last 72 hours.  Lab Results  Component Value Date   SARSCOV2NAA POSITIVE (A) 07/10/2021   [place] NEGATIVE 06/24/2021    CBC: No results for input(s): WBC, NEUTROABS, HGB, HCT, MCV, PLT in the last  168 hours.  Cardiac Enzymes: No results for input(s): CKTOTAL, CKMB, CKMBINDEX, TROPONINI in the last 168 hours. BNP (last 3 results) No results for input(s): PROBNP in the last 8760 hours. CBG: No results for input(s): GLUCAP in the last 168 hours. D-Dimer: No results for input(s): DDIMER in the last 72 hours. Hgb A1c: No results for input(s): HGBA1C in the last 72 hours. Lipid Profile: No results for input(s): CHOL, HDL, LDLCALC, TRIG, CHOLHDL, LDLDIRECT in the last 72 hours. Thyroid function studies: No results for input(s): TSH, T4TOTAL, T3FREE, THYROIDAB in the last 72 hours.  Invalid input(s): FREET3 Anemia work up: No results for input(s): VITAMINB12, FOLATE, FERRITIN, TIBC, IRON, RETICCTPCT in the last 72 hours. Sepsis Labs: Recent Labs  Lab 07/26/21 0333  PROCALCITON 2.00    Microbiology Recent Results (from the past 240 hour(s))  Culture, blood (routine x 2)     Status: Abnormal   Collection Time: 07/24/21 10:39 AM   Specimen: BLOOD  Result  Value Ref Range Status   Specimen Description BLOOD LEFT ANTECUBITAL  Final   Special Requests   Final    BOTTLES DRAWN AEROBIC AND ANAEROBIC Blood Culture adequate volume   Culture  Setup Time   Final    GRAM POSITIVE COCCI IN CLUSTERS IN BOTH AEROBIC AND ANAEROBIC BOTTLES    Culture (A)  Final    STAPHYLOCOCCUS AUREUS SUSCEPTIBILITIES PERFORMED ON PREVIOUS CULTURE WITHIN THE LAST 5 DAYS. Performed at [facility], [place] [address], [place], [place] [address]    Report Status 07/27/2021 FINAL  Final  Culture, blood (routine x 2)     Status: Abnormal   Collection Time: 07/24/21 10:48 AM   Specimen: BLOOD  Result Value Ref Range Status   Specimen Description BLOOD RIGHT ANTECUBITAL  Final   Special Requests   Final    BOTTLES DRAWN AEROBIC AND ANAEROBIC Blood Culture adequate volume   Culture  Setup Time   Final    GRAM POSITIVE COCCI IN CLUSTERS IN BOTH AEROBIC AND ANAEROBIC BOTTLES CRITICAL RESULT CALLED TO,  READ BACK BY AND VERIFIED WITH: L SEAY,PHARMD'@0629'$  07/25/21 [place] Performed at [facility], [place] [address], [place], [place] [address]    Culture STAPHYLOCOCCUS AUREUS (A)  Final   Report Status 07/27/2021 FINAL  Final   Organism ID, Bacteria STAPHYLOCOCCUS AUREUS  Final      Susceptibility   Staphylococcus aureus - MIC*    CIPROFLOXACIN <=0.5 SENSITIVE Sensitive     ERYTHROMYCIN <=0.25 SENSITIVE Sensitive     GENTAMICIN <=0.5 SENSITIVE Sensitive     OXACILLIN 0.5 SENSITIVE Sensitive     TETRACYCLINE <=1 SENSITIVE Sensitive     VANCOMYCIN 1 SENSITIVE Sensitive     TRIMETH/SULFA <=10 SENSITIVE Sensitive     CLINDAMYCIN <=0.25 SENSITIVE Sensitive     RIFAMPIN <=0.5 SENSITIVE Sensitive     Inducible Clindamycin NEGATIVE Sensitive     * STAPHYLOCOCCUS AUREUS  Blood Culture ID Panel (Reflexed)     Status: Abnormal   Collection Time: 07/24/21 10:48 AM  Result Value Ref Range Status   Enterococcus faecalis NOT DETECTED NOT DETECTED Final   Enterococcus Faecium NOT DETECTED NOT DETECTED Final   Listeria monocytogenes NOT DETECTED NOT DETECTED Final   Staphylococcus species DETECTED (A) NOT DETECTED Final    Comment: CRITICAL RESULT CALLED TO, READ BACK BY AND VERIFIED WITH: L SEAY,PHARMD'@0630'$  07/25/21 [place]    Staphylococcus aureus (BCID) DETECTED (A) NOT DETECTED Final    Comment: CRITICAL RESULT CALLED TO, READ BACK BY AND VERIFIED WITH: L SEAY,PHARMD'@0630'$  07/25/21 [place]    Staphylococcus epidermidis NOT DETECTED NOT DETECTED Final   Staphylococcus lugdunensis NOT DETECTED NOT DETECTED Final   Streptococcus species NOT DETECTED NOT DETECTED Final   Streptococcus agalactiae NOT DETECTED NOT DETECTED Final   Streptococcus pneumoniae NOT DETECTED NOT DETECTED Final   Streptococcus pyogenes NOT DETECTED NOT DETECTED Final   A.calcoaceticus-baumannii NOT DETECTED NOT DETECTED Final   Bacteroides fragilis NOT DETECTED NOT DETECTED Final   Enterobacterales NOT DETECTED NOT DETECTED Final    Enterobacter cloacae complex NOT DETECTED NOT DETECTED Final   Escherichia coli NOT DETECTED NOT DETECTED Final   Klebsiella aerogenes NOT DETECTED NOT DETECTED Final   Klebsiella oxytoca NOT DETECTED NOT DETECTED Final   Klebsiella pneumoniae NOT DETECTED NOT DETECTED Final   Proteus species NOT DETECTED NOT DETECTED Final   Salmonella species NOT DETECTED NOT DETECTED Final   Serratia marcescens NOT DETECTED NOT DETECTED Final   Haemophilus influenzae NOT DETECTED NOT  DETECTED Final   Neisseria meningitidis NOT DETECTED NOT DETECTED Final   Pseudomonas aeruginosa NOT DETECTED NOT DETECTED Final   Stenotrophomonas maltophilia NOT DETECTED NOT DETECTED Final   Candida albicans NOT DETECTED NOT DETECTED Final   Candida auris NOT DETECTED NOT DETECTED Final   Candida glabrata NOT DETECTED NOT DETECTED Final   Candida krusei NOT DETECTED NOT DETECTED Final   Candida parapsilosis NOT DETECTED NOT DETECTED Final   Candida tropicalis NOT DETECTED NOT DETECTED Final   Cryptococcus neoformans/gattii NOT DETECTED NOT DETECTED Final   Meth resistant mecA/C and MREJ NOT DETECTED NOT DETECTED Final    Comment: Performed at [facility], [place] [address] [place], [place] [address]  Culture, blood (routine x 2)     Status: None   Collection Time: 07/25/21  8:06 AM   Specimen: BLOOD  Result Value Ref Range Status   Specimen Description BLOOD LEFT ANTECUBITAL  Final   Special Requests   Final    BOTTLES DRAWN AEROBIC AND ANAEROBIC Blood Culture adequate volume   Culture   Final    NO GROWTH 5 DAYS Performed at [facility], [place] [address], [place], [place] [address]    Report Status 07/30/2021 FINAL  Final  Culture, blood (routine x 2)     Status: None   Collection Time: 07/25/21  8:06 AM   Specimen: BLOOD  Result Value Ref Range Status   Specimen Description BLOOD LEFT ANTECUBITAL  Final   Special Requests   Final    BOTTLES DRAWN AEROBIC AND ANAEROBIC Blood Culture adequate volume    Culture   Final    NO GROWTH 5 DAYS Performed at [facility], [place] [address] [place], [place] [address]    Report Status 07/30/2021 FINAL  Final     Medications:    vitamin C  500 mg Oral Daily   benzonatate  100 mg Oral TID   enoxaparin (LOVENOX) injection  40 mg Subcutaneous QHS   feeding supplement  237 mL Oral TID BM   guaiFENesin  1,200 mg Oral BID   linezolid  600 mg Oral Q12H   mouth rinse  15 mL Mouth Rinse BID   metoprolol tartrate  12.5 mg Oral BID   multivitamin with minerals  1 tablet Oral Daily   pantoprazole  40 mg Oral BID   zinc sulfate  220 mg Oral Daily   Continuous Infusions:  sodium chloride 500 mL (07/11/21 1647)      LOS: 37 days   Charlynne Cousins  [facility]  08/01/2021, 8:15 AM

## 2021-08-02 ENCOUNTER — Ambulatory Visit: Payer: Medicare HMO | Admitting: Cardiology

## 2021-08-02 ENCOUNTER — Telehealth: Payer: Medicare HMO | Admitting: Cardiology

## 2021-08-02 NOTE — Progress Notes (Signed)
Nutrition Follow-up  DOCUMENTATION CODES:   Not applicable  INTERVENTION:   - Continue Ensure Enlive po TID, each supplement provides 350 kcal and 20 grams of protein  - Add double protein portions at meals  - Continue MVI with minerals daily  NUTRITION DIAGNOSIS:   Inadequate oral intake related to inability to eat as evidenced by NPO status.  Diet advanced   GOAL:   Patient will meet greater than or equal to 90% of their needs  Intake declined slighty  MONITOR:   PO intake, Supplement acceptance, Labs, Weight trends, I & O's  REASON FOR ASSESSMENT:   Ventilator    ASSESSMENT:   87 y.o. female who is a former smoker and has medical history of HTN, dyslipidemia, GERD, Morton's neuroma, idiopathic scoliosis, mitral valve prolapse, anxiety, psychosis, and diverticulitis. She presented to the ED via EMS d/t acute onset of hemoptysis. In the ED she was initially hemodynamically stable but then experienced sudden episode of O2 desaturation into the 80s and became altered with confusion. She was subsequently intubated in the ED.  7/25 - extubated 7/26 - chest tube placed for L pneumothorax, clear liquids, transferred to [facility] 7/27 - diet advanced to regular consistency  8/08 - COVID+  8/22 - fever, found to have MSSA bacteremia   Intake remains slightly declined due to intermittent breathing difficulty. Last four meals documented as 20%, 25%, 25%, and 75%. Patient continues to drink 1-2 Ensures daily. RD to add double protein portions to meals to maximize protein. If intake continues to be poor, recommend Cortrak placement with EN.   Admit weight: 73.5 kg Current weight: 79.8 kg  Medications: 500 mg Vitamin C, zinc sulfate 220 mg  Labs: Na 134 (L)  Diet Order:   Diet Order             Diet regular Room service appropriate? Yes with Assist; Fluid consistency: Thin  Diet effective now                   EDUCATION NEEDS:   Education needs have been  addressed  Skin:  Skin Assessment: Reviewed RN Assessment  Last BM:  8/29  Height:   Ht Readings from Last 1 Encounters:  06/25/21 '5\' 2"'$  (1.575 m)    Weight:   Wt Readings from Last 1 Encounters:  08/02/21 79.8 kg    Ideal Body Weight:  50 kg  BMI:  Body mass index is 32.19 kg/m.  Estimated Nutritional Needs:   Kcal:  1400-1600  Protein:  70-85 grams  Fluid:  >/= 1.5 L/day  Mariana Single MS, RD, LDN, CNSC Clinical Nutrition Pager listed in [place]

## 2021-08-02 NOTE — Progress Notes (Signed)
Physical Therapy Treatment Patient Details Name: Laterika Boesch MRN: [id number] DOB: [birth year] Today's Date: 08/02/2021    History of Present Illness Pt is 87 y.o. female presents to [place] [facility] ED on 06/24/2021 with sudden onset hemoptysis. Pt experienced a bout of hypoxia in ED with AMS and was intubated on 7/23. Pt was transported to [facility] hospital on 7/24, found to have pneumomediastinum, pneumothorax. Chest tube inserted 7/24. Pt extubated on 7/25. Now covid+ as of 07/10/21. PMH includes history of smoking, hypertension, mitral valve prolapse.    PT Comments    Pt appears to be feeling much better today and with less coughing with activity.  She was on 4 L O2 rest and 6 L with activity with stable sats but requiring frequent rest breaks.  Ambulated 12'x2 then 24' with chair follow and extended rest breaks. Requiring cues for safety. Continue plan of care.    Follow Up Recommendations  SNF;Supervision/Assistance - 24 hour     Equipment Recommendations  Rolling walker with 5" wheels;3in1 (PT)    Recommendations for Other Services       Precautions / Restrictions Precautions Precautions: Fall Precaution Comments: monitor O2 (did not wear O2 at baseline)    Mobility  Bed Mobility Overal bed mobility: Needs Assistance Bed Mobility: Supine to Sit     Supine to sit: Min assist     General bed mobility comments: Cues for safety with min A to stabilize    Transfers Overall transfer level: Needs assistance Equipment used: Rolling walker (2 wheeled) Transfers: Sit to/from Stand Sit to Stand: Min assist;Mod assist Stand pivot transfers: Min assist       General transfer comment: Varied between min-mod A.  Performed x 8 during session (from bed, recliner, bsc) with good carry over of cues to push up with hands after first cue.  Pivoted from bed to bsc and back with min A, required assist for adls  Ambulation/Gait Ambulation/Gait assistance: Min assist Gait Distance  (Feet): 24 Feet (12'x2 then 24') Assistive device: Rolling walker (2 wheeled) Gait Pattern/deviations: Step-to pattern;Decreased stride length     General Gait Details: Chair follow and cues for RW proximity.  Fatigued easily requiring extended seated rest breaks.   Stairs             Wheelchair Mobility    Modified Rankin (Stroke Patients Only)       Balance Overall balance assessment: Needs assistance Sitting-balance support: Feet supported;No upper extremity supported Sitting balance-[name] Scale: Fair     Standing balance support: Bilateral upper extremity supported;During functional activity Standing balance-[name] Scale: Poor Standing balance comment: Requiring RW                            Cognition Arousal/Alertness: Awake/alert Behavior During Therapy: WFL for tasks assessed/performed Overall Cognitive Status: No family/caregiver present to determine baseline cognitive functioning                                 General Comments: WFL for basic tasks difficult to completely assess due to [facility]; more talkative and witty today ; making some comments (mildly inappropriate) question if confused v/s just joking;      Exercises      General Comments General comments (skin integrity, edema, etc.): Pt on 4 L O2 at arrival with sats 94% rest, placed on 6 L O2 for activity and sats maintained 88-92%, placed  back on 4 L at end of session with sats 94%; HR and bp stable      Pertinent Vitals/Pain Pain Assessment: No/denies pain    Home Living                      Prior Function            PT Goals (current goals can now be found in the care plan section) Progress towards PT goals: Progressing toward goals    Frequency    Min 2X/week      PT Plan Current plan remains appropriate    Co-evaluation              AM-PAC PT "6 Clicks" Mobility   Outcome Measure  Help needed turning from your back to your side while  in a flat bed without using bedrails?: A Little Help needed moving from lying on your back to sitting on the side of a flat bed without using bedrails?: A Little Help needed moving to and from a bed to a chair (including a wheelchair)?: A Little Help needed standing up from a chair using your arms (e.g., wheelchair or bedside chair)?: A Little Help needed to walk in hospital room?: A Little Help needed climbing 3-5 steps with a railing? : A Lot 6 Click Score: 17    End of Session Equipment Utilized During Treatment: Gait belt;Oxygen Activity Tolerance: Patient tolerated treatment well Patient left: with call bell/phone within reach;with chair alarm set;in chair Nurse Communication: Mobility status PT Visit Diagnosis: Muscle weakness (generalized) (M62.81);Other abnormalities of gait and mobility (R26.89);Difficulty in walking, not elsewhere classified (R26.2)     Time: [id number] PT Time Calculation (min) (ACUTE ONLY): 49 min  Charges:  $Gait Training: 23-37 mins $Therapeutic Activity: 8-22 mins                     Abran Richard, PT Acute Rehab Services Pager [phone] [facility] [place] 08/02/2021, 5:48 PM

## 2021-08-02 NOTE — Progress Notes (Signed)
Occupational Therapy Treatment Patient Details Name: Mary Strickland MRN: [id number] DOB: 1930-01-28 Today's Date: 08/02/2021    History of present illness Pt is 87 y.o. female presents to [place] [facility] ED on 06/24/2021 with sudden onset hemoptysis. Pt experienced a bout of hypoxia in ED with AMS and was intubated on 7/23. Pt was transported to [facility] hospital on 7/24, found to have pneumomediastinum, pneumothorax. Chest tube inserted 7/24. Pt extubated on 7/25. Now covid+ as of 07/10/21. PMH includes history of smoking, hypertension, mitral valve prolapse.   OT comments  Pt with slower progress towards OT goals this AM with noted increasing supplemental O2 demands. Guided pt in ADLs seated at sink with emphasis on energy conservation strategies. Pt overall Min A for mobility using RW, Setup for UB ADLs and Min A for LB ADLs with increasing assist needed with fatigue. Pt received on 9 L HFNC, able to be titrated to 6 L O2 with activity and 5 L O2 at rest with sats in low 90s. Plan to progress standing tolerance with ADLs, UE strength with HEP and breathing techniques during functional tasks. Continue recommend SNF rehab.   Follow Up Recommendations  SNF    Equipment Recommendations  Other (comment) (Rolling walker)    Recommendations for Other Services      Precautions / Restrictions Precautions Precautions: Fall Precaution Comments: monitor O2 (did not wear O2 at baseline) Restrictions Weight Bearing Restrictions: No       Mobility Bed Mobility Overal bed mobility: Needs Assistance Bed Mobility: Supine to Sit     Supine to sit: Supervision;HOB elevated     General bed mobility comments: increased time/effort    Transfers Overall transfer level: Needs assistance Equipment used: Rolling walker (2 wheeled) Transfers: Sit to/from Stand Sit to Stand: Min assist         General transfer comment: min guard for sit to stand from bedside, Min A from chair w/o armrests at sink     Balance Overall balance assessment: Needs assistance Sitting-balance support: Feet supported;No upper extremity supported Sitting balance-[name] Scale: Fair     Standing balance support: Bilateral upper extremity supported;During functional activity Standing balance-[name] Scale: Poor Standing balance comment: reliant on at least one UE support in standing for ADLs                           ADL either performed or assessed with clinical judgement   ADL Overall ADL's : Needs assistance/impaired Eating/Feeding: Set up;Sitting Eating/Feeding Details (indicate cue type and reason): assist to cut up food Grooming: Set up;Sitting;Wash/dry face;Oral care;Applying deodorant   Upper Body Bathing: Set up;Sitting   Lower Body Bathing: Minimal assistance;Sit to/from stand Lower Body Bathing Details (indicate cue type and reason): assist for posterior hygiene in standing due to fatigue with bathing tasks Upper Body Dressing : Set up;Sitting                   Functional mobility during ADLs: Minimal assistance;Rolling walker General ADL Comments: Increased fatigue and increased O2 demands overnight (able to titrate from 9 L HFNC to 5 L HFNC during session). Requires frequent rest breaks     Vision   Vision Assessment?: No apparent visual deficits   Perception     Praxis      Cognition Arousal/Alertness: Awake/alert Behavior During Therapy: WFL for tasks assessed/performed Overall Cognitive Status: Within Functional Limits for tasks assessed  General Comments: WFL for basic tasks, some decreased awareness of need for OOB activity, pulmonary health, etc. pleasant and witty        Exercises     Shoulder Instructions       General Comments Received on 9 L HFNC, 100%, 97% at 7 L O2, 6 L O2 with activity 88-92%, left on 5 L O2 with 92% at rest    Pertinent Vitals/ Pain       Pain Assessment: No/denies pain Pain  Intervention(s): Monitored during session  Home Living                                          Prior Functioning/Environment              Frequency  Min 2X/week        Progress Toward Goals  OT Goals(current goals can now be found in the care plan section)  Progress towards OT goals: Progressing toward goals  Acute Rehab OT Goals Patient Stated Goal: go home OT Goal Formulation: With patient Time For Goal Achievement: 08/14/21 Potential to Achieve Goals: Good ADL Goals Pt Will Perform Grooming: with set-up;standing Pt Will Perform Lower Body Bathing: with set-up;sit to/from stand Pt Will Perform Lower Body Dressing: with set-up;sit to/from stand Pt Will Transfer to Toilet: with supervision;ambulating Pt Will Perform Toileting - Clothing Manipulation and hygiene: with set-up;sit to/from stand;sitting/lateral leans Additional ADL Goal #1: Pt will tolerate OOB functional activity for at least 5 min to maximize ADL/mobility endurance  Plan Discharge plan remains appropriate    Co-evaluation                 AM-PAC OT "6 Clicks" Daily Activity     Outcome Measure   Help from another person eating meals?: None Help from another person taking care of personal grooming?: A Little Help from another person toileting, which includes using toliet, bedpan, or urinal?: A Little Help from another person bathing (including washing, rinsing, drying)?: A Little Help from another person to put on and taking off regular upper body clothing?: A Little Help from another person to put on and taking off regular lower body clothing?: A Lot 6 Click Score: 18    End of Session Equipment Utilized During Treatment: Rolling walker;Oxygen  OT Visit Diagnosis: Unsteadiness on feet (R26.81);Muscle weakness (generalized) (M62.81);Pain   Activity Tolerance Patient tolerated treatment well   Patient Left in chair;with call bell/phone within reach;with chair alarm set    Nurse Communication Mobility status;Other (comment) (O2)        Time: [id number] OT Time Calculation (min): 42 min  Charges: OT General Charges $OT Visit: 1 Visit OT Treatments $Self Care/Home Management : 23-37 mins $Therapeutic Activity: 8-22 mins  Malachy Chamber, OTR/L Acute Rehab Services Office: [phone]    Layla Maw 08/02/2021, 7:58 AM

## 2021-08-02 NOTE — TOC Progression Note (Signed)
Transition of Care [facility]) - Progression Note    Patient Details  Name: Mary Strickland MRN: [id number] Date of Birth: 08-29-1930  Transition of Care [facility]) CM/SW [place], [place] Phone Number: 08/02/2021, 4:48 PM  Clinical Narrative:     CSW received message [employer] declined SNF approval.  CSW spoke with patient's daughter,[name]- updated on insurance denial. Family remains agreeable to paying out of pocket for SNF and cost of Transport. CSW informed [facility] Transport patient is on 4L of oxygen.   CSW updated RNCM, Charge RN, SNF and [facility]   SNF informed anticipate d/c tomorrow to [facility] # [phone]  TOC will continue to follow and assist with discharge planning.  Thurmond Butts, MSW, LCSW Clinical Social Worker    Expected Discharge Plan: Home/Self Care Barriers to Discharge: Barriers Resolved  Expected Discharge Plan and Services Expected Discharge Plan: Home/Self Care In-house Referral: Clinical Social Work     Living arrangements for the past 2 months: Single Family Home Expected Discharge Date: 07/07/21                                     Social Determinants of Health (SDOH) Interventions    Readmission Risk Interventions No flowsheet data found.

## 2021-08-02 NOTE — Progress Notes (Signed)
PROGRESS NOTE    Mary Strickland  [id number] DOB: 22-Nov-1930 DOA: 06/24/2021 PCP: Burnard Bunting, MD    Brief Narrative:  Mary Strickland s an 87 year old female with past medical history significant for essential hypertension, mitral valve prolapse, tobacco use disorder who presented to [facility] ED on 7/23 with hemoptysis with subsequent severe hypoxia requiring endotracheal intubation.  Patient was found to have a pneumomediastinum and left pneumothorax and was treated under the critical care service with a chest tube.  In regards to her hemoptysis, this was treated conservatively.  Patient was initially transitioned to comfort measures with tubes removed; she recovered fairly well with improvement in her clinical condition.  During her hospital course, patient was treated for pneumonia, COVID-19 viral pneumonia, acute urinary retention and MSSA bacteremia.  Patient was transferred from the PCCM service to [facility] on 7/30.  Patient is currently medically stable and pending discharge to SNF.   Assessment & Plan:   Principal Problem:   MSSA bacteremia Active Problems:   Hypertension   Hemoptysis   Acute hypoxemic respiratory failure (HCC)   Abnormal CXR   Leukocytosis   Subcutaneous emphysema (HCC)   Pneumoperitoneum of unknown etiology   Pneumothorax on left   COVID   Acute respiratory failure with hypoxia: Resolved Etiology likely secondary to hemoptysis with pneumomediastinum and left pneumothorax as below.  Required admission to the critical care service and mechanical ventilation/intubation.  Patient was initially intubated on 7/23 and successfully extubated on 7/25.   --Continue supplemental oxygen, goal SPO2 >92%, on 4L [place]  COVID-19 viral pneumonia Multifocal pneumonia On initial presentation, patient was treated with IV meropenem which was transitioned to IV Unasyn and completed treatment course.  During hospital course patient also tested positive for COVID-19 and  completed course of remdesivir and Decadron.   --Unasyn 1.5g IV q6h - restarted 8/30 --Continues on 4 L nasal cannula  MSSA bacteremia Unclear source, patient with respiratory symptoms and was requiring ventilatory support during her hospitalization. Sputum cultures negative.  Repeat blood cultures 8/23 no growth x5 days. --Continue Linezolid '600mg'$  PO BID (End: 9/6 after 2 weeks course)  Left pneumothorax Pneumomediastinum Hemoptysis Chest tube was placed on 7/25 and subsequently removed on 7/28.  Patient's aspirin was discontinued.  No intervention for hemoptysis was performed which has now resolved.  --Continue supplemental oxygen, 4 L nasal cannula  Acute urinary retention: Resolved Foley catheter placed on 8/8, removed on 8/17. --Continue to monitor urinary output  Elevated troponin likely secondary to type II demand ischemia Etiology likely secondary to type II demand ischemia in the setting of active infections, hemoptysis, pneumothorax and pneumomediastinum.  Cardiology was consulted, transthoracic echocardiogram with normal LVEF and no wall motion normalities.  Cardiology now signed off.  Malpositioned left IJ catheter CT neck without focal injury, catheter removed.  Evaluated by vascular surgery with no further recommendations.  Primary essential hypertension Patient is on valsartan and amlodipine as outpatient. --Metoprolol tartrate 12.5 mg p.o. twice daily  Obesity Body mass index is 32.19 kg/m.Discussed with patient needs for aggressive lifestyle changes/weight loss as this complicates all facets of care.  Outpatient follow-up with PCP.  May benefit from bariatric evaluation outpatient.   DVT prophylaxis: enoxaparin (LOVENOX) injection 40 mg Start: 07/27/21 2200 SCDs Start: 06/25/21 0310   Code Status: DNR Family Communication: No family present at bedside this morning  Disposition Plan:  Level of care: Med-Surg Status is: Inpatient  Remains inpatient appropriate  because:Unsafe d/c plan, IV treatments appropriate due to intensity of illness  or inability to take PO, and Inpatient level of care appropriate due to severity of illness  Dispo: The patient is from: Home              Anticipated d/c is to: SNF              Patient currently is not medically stable to d/c.   Difficult to place patient No   Consultants:  PCCM Infectious disease Palliative care Cardiology Vascular surgery, Dr. Scot Dock  Procedures:  Intubated 7/23 Extubated 7/25 Left chest tube placed 7/25 Central line placed 7/25  Antimicrobials:  Unasyn 7/25 - 7/26, 8/30>> Cefazolin 8/23 - 8/26 Linezolid 8/26>> Cefepime 7/24 - 7/24 Ceftriaxone 8/23 - 8/24 Azithromycin 8/23 - 8/24 Meropenem 7/24 - 7/25 Remdesivir 8 /9 - 8/13    Subjective: Patient seen examined at bedside, resting comfortably.  No complaints this morning.  Awaiting insurance authorization for SNF placement.  Denies headache, no fever/chills/night sweats, no nausea/vomiting/diarrhea, no chest pain, no palpitations, no abdominal pain.  No acute events overnight per nursing staff.  Objective: Vitals:   08/01/21 2331 08/02/21 0441 08/02/21 0815 08/02/21 1140  BP: (!) 164/83 116/69 (!) 131/59 112/61  Pulse: 100 84 94 87  Resp: '15 16 18 20  '$ Temp: 98 F (36.7 C) 98 F (36.7 C) 99 F (37.2 C) 98.3 F (36.8 C)  TempSrc: Oral Oral Oral Oral  SpO2: 91% 100% 100% 96%  Weight:  79.8 kg    Height:        Intake/Output Summary (Last 24 hours) at 08/02/2021 1531 Last data filed at 08/02/2021 0819 Gross per 24 hour  Intake 320 ml  Output --  Net 320 ml   Filed Weights   07/31/21 0325 08/01/21 0400 08/02/21 0441  Weight: 79.3 kg 76.2 kg 79.8 kg    Examination:  General exam: Appears calm and comfortable, elderly in appearance Respiratory system: Coarse breath sounds bilaterally, no wheezing/crackles, normal respiratory effort without accessory muscle use, on 4 L nasal cannula with SPO2  100%. Cardiovascular system: S1 & S2 heard, RRR. No JVD, murmurs, rubs, gallops or clicks. No pedal edema. Gastrointestinal system: Abdomen is nondistended, soft and nontender. No organomegaly or masses felt. Normal bowel sounds heard. Central nervous system: Alert and oriented. No focal neurological deficits. Extremities: Symmetric 5 x 5 power. Skin: No rashes, lesions or ulcers Psychiatry: Judgement and insight appear normal. Mood & affect appropriate.     Data Reviewed: I have personally reviewed following labs and imaging studies  CBC: Recent Labs  Lab 08/01/21 0827  WBC 11.6*  NEUTROABS 9.2*  HGB 8.9*  HCT 27.4*  MCV 92.9  PLT [id number]   Basic Metabolic Panel: Recent Labs  Lab 07/27/21 0148  NA 134*  K 4.6  CL 102  CO2 25  GLUCOSE 94  BUN 24*  CREATININE 0.93  CALCIUM 8.3*   GFR: Estimated Creatinine Clearance: 40.9 mL/min (by C-G formula based on SCr of 0.93 mg/dL). Liver Function Tests: No results for input(s): AST, ALT, ALKPHOS, BILITOT, PROT, ALBUMIN in the last 168 hours. No results for input(s): LIPASE, AMYLASE in the last 168 hours. No results for input(s): AMMONIA in the last 168 hours. Coagulation Profile: No results for input(s): INR, PROTIME in the last 168 hours. Cardiac Enzymes: No results for input(s): CKTOTAL, CKMB, CKMBINDEX, TROPONINI in the last 168 hours. BNP (last 3 results) No results for input(s): PROBNP in the last 8760 hours. HbA1C: No results for input(s): HGBA1C in the last 72 hours. CBG: No  results for input(s): GLUCAP in the last 168 hours. Lipid Profile: No results for input(s): CHOL, HDL, LDLCALC, TRIG, CHOLHDL, LDLDIRECT in the last 72 hours. Thyroid Function Tests: No results for input(s): TSH, T4TOTAL, FREET4, T3FREE, THYROIDAB in the last 72 hours. Anemia Panel: No results for input(s): VITAMINB12, FOLATE, FERRITIN, TIBC, IRON, RETICCTPCT in the last 72 hours. Sepsis Labs: No results for input(s): PROCALCITON, LATICACIDVEN  in the last 168 hours.  Recent Results (from the past 240 hour(s))  Culture, blood (routine x 2)     Status: Abnormal   Collection Time: 07/24/21 10:39 AM   Specimen: BLOOD  Result Value Ref Range Status   Specimen Description BLOOD LEFT ANTECUBITAL  Final   Special Requests   Final    BOTTLES DRAWN AEROBIC AND ANAEROBIC Blood Culture adequate volume   Culture  Setup Time   Final    GRAM POSITIVE COCCI IN CLUSTERS IN BOTH AEROBIC AND ANAEROBIC BOTTLES    Culture (A)  Final    STAPHYLOCOCCUS AUREUS SUSCEPTIBILITIES PERFORMED ON PREVIOUS CULTURE WITHIN THE LAST 5 DAYS. Performed at [facility], [place] [address] [place], [place] [address]    Report Status 07/27/2021 FINAL  Final  Culture, blood (routine x 2)     Status: Abnormal   Collection Time: 07/24/21 10:48 AM   Specimen: BLOOD  Result Value Ref Range Status   Specimen Description BLOOD RIGHT ANTECUBITAL  Final   Special Requests   Final    BOTTLES DRAWN AEROBIC AND ANAEROBIC Blood Culture adequate volume   Culture  Setup Time   Final    GRAM POSITIVE COCCI IN CLUSTERS IN BOTH AEROBIC AND ANAEROBIC BOTTLES CRITICAL RESULT CALLED TO, READ BACK BY AND VERIFIED WITH: L SEAY,PHARMD'@0629'$  07/25/21 [place] Performed at [facility], [place] [address], [place], [place] [address]    Culture STAPHYLOCOCCUS AUREUS (A)  Final   Report Status 07/27/2021 FINAL  Final   Organism ID, Bacteria STAPHYLOCOCCUS AUREUS  Final      Susceptibility   Staphylococcus aureus - MIC*    CIPROFLOXACIN <=0.5 SENSITIVE Sensitive     ERYTHROMYCIN <=0.25 SENSITIVE Sensitive     GENTAMICIN <=0.5 SENSITIVE Sensitive     OXACILLIN 0.5 SENSITIVE Sensitive     TETRACYCLINE <=1 SENSITIVE Sensitive     VANCOMYCIN 1 SENSITIVE Sensitive     TRIMETH/SULFA <=10 SENSITIVE Sensitive     CLINDAMYCIN <=0.25 SENSITIVE Sensitive     RIFAMPIN <=0.5 SENSITIVE Sensitive     Inducible Clindamycin NEGATIVE Sensitive     * STAPHYLOCOCCUS AUREUS  Blood Culture ID Panel  (Reflexed)     Status: Abnormal   Collection Time: 07/24/21 10:48 AM  Result Value Ref Range Status   Enterococcus faecalis NOT DETECTED NOT DETECTED Final   Enterococcus Faecium NOT DETECTED NOT DETECTED Final   Listeria monocytogenes NOT DETECTED NOT DETECTED Final   Staphylococcus species DETECTED (A) NOT DETECTED Final    Comment: CRITICAL RESULT CALLED TO, READ BACK BY AND VERIFIED WITH: L SEAY,PHARMD'@0630'$  07/25/21 [place]    Staphylococcus aureus (BCID) DETECTED (A) NOT DETECTED Final    Comment: CRITICAL RESULT CALLED TO, READ BACK BY AND VERIFIED WITH: L SEAY,PHARMD'@0630'$  07/25/21 [place]    Staphylococcus epidermidis NOT DETECTED NOT DETECTED Final   Staphylococcus lugdunensis NOT DETECTED NOT DETECTED Final   Streptococcus species NOT DETECTED NOT DETECTED Final   Streptococcus agalactiae NOT DETECTED NOT DETECTED Final   Streptococcus pneumoniae NOT DETECTED NOT DETECTED Final   Streptococcus pyogenes NOT DETECTED NOT DETECTED  Final   A.calcoaceticus-baumannii NOT DETECTED NOT DETECTED Final   Bacteroides fragilis NOT DETECTED NOT DETECTED Final   Enterobacterales NOT DETECTED NOT DETECTED Final   Enterobacter cloacae complex NOT DETECTED NOT DETECTED Final   Escherichia coli NOT DETECTED NOT DETECTED Final   Klebsiella aerogenes NOT DETECTED NOT DETECTED Final   Klebsiella oxytoca NOT DETECTED NOT DETECTED Final   Klebsiella pneumoniae NOT DETECTED NOT DETECTED Final   Proteus species NOT DETECTED NOT DETECTED Final   Salmonella species NOT DETECTED NOT DETECTED Final   Serratia marcescens NOT DETECTED NOT DETECTED Final   Haemophilus influenzae NOT DETECTED NOT DETECTED Final   Neisseria meningitidis NOT DETECTED NOT DETECTED Final   Pseudomonas aeruginosa NOT DETECTED NOT DETECTED Final   Stenotrophomonas maltophilia NOT DETECTED NOT DETECTED Final   Candida albicans NOT DETECTED NOT DETECTED Final   Candida auris NOT DETECTED NOT DETECTED Final   Candida glabrata NOT DETECTED  NOT DETECTED Final   Candida krusei NOT DETECTED NOT DETECTED Final   Candida parapsilosis NOT DETECTED NOT DETECTED Final   Candida tropicalis NOT DETECTED NOT DETECTED Final   Cryptococcus neoformans/gattii NOT DETECTED NOT DETECTED Final   Meth resistant mecA/C and MREJ NOT DETECTED NOT DETECTED Final    Comment: Performed at [facility], [address] [place], [place] [address]  Culture, blood (routine x 2)     Status: None   Collection Time: 07/25/21  8:06 AM   Specimen: BLOOD  Result Value Ref Range Status   Specimen Description BLOOD LEFT ANTECUBITAL  Final   Special Requests   Final    BOTTLES DRAWN AEROBIC AND ANAEROBIC Blood Culture adequate volume   Culture   Final    NO GROWTH 5 DAYS Performed at [facility], [place] [address] [place], [place] [address]    Report Status 07/30/2021 FINAL  Final  Culture, blood (routine x 2)     Status: None   Collection Time: 07/25/21  8:06 AM   Specimen: BLOOD  Result Value Ref Range Status   Specimen Description BLOOD LEFT ANTECUBITAL  Final   Special Requests   Final    BOTTLES DRAWN AEROBIC AND ANAEROBIC Blood Culture adequate volume   Culture   Final    NO GROWTH 5 DAYS Performed at [facility], [place] [address] [place], [place] [address]    Report Status 07/30/2021 FINAL  Final         Radiology Studies: CT CHEST WO CONTRAST  Result Date: 08/01/2021 CLINICAL DATA:  Cough and persistent shortness of breath. EXAM: CT CHEST WITHOUT CONTRAST TECHNIQUE: Multidetector CT imaging of the chest was performed following the standard protocol without IV contrast. COMPARISON:  06/25/2021 FINDINGS: Cardiovascular: Heart size upper normal. No substantial pericardial effusion. Coronary artery calcification is evident. Moderate atherosclerotic calcification is noted in the wall of the thoracic aorta. Mediastinum/Nodes: Scattered small mediastinal lymph nodes again noted. The esophagus has normal imaging features. No  evidence for gross hilar lymphadenopathy although assessment is limited by the lack of intravenous contrast on the current study. Tiny hiatal hernia noted. There is no axillary lymphadenopathy. Lungs/Pleura: Interval progression of collapse/consolidation in the right lower lobe and right middle lobe with volume loss in the right hemithorax. Pneumomediastinum and pleural gas seen on the previous exam has resolved in the interval. 2.2 x 1.3 cm nodular focus of consolidative opacity identified posterior left upper lobe on image 40/4. Scattered areas of architectural distortion in scarring are seen in the left  lung with left lower lobe consolidation and associated volume loss. Posterior lower lobe airways bilaterally are impacted. Upper Abdomen: Unremarkable. Musculoskeletal: No worrisome lytic or sclerotic osseous abnormality. Thoracic spinal stimulator device evident. IMPRESSION: 1. Interval progression of collapse/consolidation involving both lower lobes, right greater than left. There is some associated consolidation of the right middle lobe. Posterior lower lobe airways bilaterally show evidence of impaction. Multifocal pneumonia would be a consideration. 2. 2.2 x 1.3 cm nodular focus of consolidative opacity posterior left upper lobe. Likely infectious/inflammatory, follow-up recommended to ensure resolution. 3. Areas of architectural distortion/scarring noted in the lungs bilaterally. 4. Interval resolution of the pneumomediastinum and pleural gas seen on the previous exam. 5. Aortic Atherosclerosis (ICD10-I70.0). Electronically Signed   By: Misty Stanley M.D.   On: 08/01/2021 12:57        Scheduled Meds:  vitamin C  500 mg Oral Daily   benzonatate  100 mg Oral TID   enoxaparin (LOVENOX) injection  40 mg Subcutaneous QHS   feeding supplement  237 mL Oral TID BM   guaiFENesin  1,200 mg Oral BID   linezolid  600 mg Oral Q12H   mouth rinse  15 mL Mouth Rinse BID   metoprolol tartrate  12.5 mg Oral BID    multivitamin with minerals  1 tablet Oral Daily   pantoprazole  40 mg Oral BID   zinc sulfate  220 mg Oral Daily   Continuous Infusions:  sodium chloride 500 mL (07/11/21 1647)   ampicillin-sulbactam (UNASYN) IV 1.5 g (08/02/21 0913)     LOS: 38 days    Time spent: 39 minutes spent on chart review, discussion with nursing staff, consultants, updating family and interview/physical exam; more than 50% of that time was spent in counseling and/or coordination of care.    Aarya Robinson J [place] DO [facility] Available via Epic secure chat 7am-7pm After these hours, please refer to coverage provider listed on [email or web address] 08/02/2021, 3:31 PM

## 2021-08-03 MED ORDER — METOPROLOL TARTRATE 25 MG PO TABS: 12.5000 mg | ORAL_TABLET | Freq: Two times a day (BID) | ORAL | 0 refills | Status: AC

## 2021-08-03 MED ORDER — AMOXICILLIN-POT CLAVULANATE 875-125 MG PO TABS: 1.0000 | ORAL_TABLET | Freq: Two times a day (BID) | ORAL | 0 refills | Status: AC

## 2021-08-03 MED ORDER — LINEZOLID 600 MG PO TABS: 600.0000 mg | ORAL_TABLET | Freq: Two times a day (BID) | ORAL | 0 refills | Status: AC

## 2021-08-03 MED ORDER — IPRATROPIUM-ALBUTEROL 20-100 MCG/ACT IN AERS: 1.0000 | INHALATION_SPRAY | Freq: Four times a day (QID) | RESPIRATORY_TRACT | 0 refills | Status: AC | PRN

## 2021-08-03 MED ORDER — ASPIRIN EC 81 MG PO TBEC: 81.0000 mg | DELAYED_RELEASE_TABLET | Freq: Every day | ORAL | 0 refills | Status: AC

## 2021-08-03 MED ORDER — MELATONIN 3 MG PO TABS: 3.0000 mg | ORAL_TABLET | Freq: Every evening | ORAL | 0 refills | Status: AC | PRN

## 2021-08-03 MED ORDER — PANTOPRAZOLE SODIUM 40 MG PO TBEC: DELAYED_RELEASE_TABLET | ORAL | 0 refills | Status: AC

## 2021-08-03 NOTE — Discharge Summary (Signed)
Physician Discharge Summary  Fanisha Persley [id number] DOB: [birth year] DOA: 06/24/2021  PCP: Burnard Bunting, MD  Admit date: 06/24/2021 Discharge date: 08/03/2021  Admitted From: Home  Disposition: [facility]  Recommendations for Outpatient Follow-up:  Follow up with PCP in 1-2 weeks Continue antibiotics with linezolid to complete 2-week course for MSSA bacteremia and on 9/6 Continue antibiotics with Augmentin to complete total 10-day course for pneumonia Recommend outpatient palliative care to follow patient after hospital discharge  Discharge Condition: Stable CODE STATUS: DNR Diet recommendation: Heart healthy diet  History of present illness:  Clarke Lindau s an 87 year old female with past medical history significant for essential hypertension, mitral valve prolapse, tobacco use disorder who presented to [facility] ED on 7/23 with hemoptysis with subsequent severe hypoxia requiring endotracheal intubation.  Patient was found to have a pneumomediastinum and left pneumothorax and was treated under the critical care service with a chest tube.  In regards to her hemoptysis, this was treated conservatively.  Patient was initially transitioned to comfort measures with tubes removed; she recovered fairly well with improvement in her clinical condition.  During her hospital course, patient was treated for pneumonia, COVID-19 viral pneumonia, acute urinary retention and MSSA bacteremia.  Patient was transferred from the PCCM service to [facility] on 7/30.    Hospital course:  Acute respiratory failure with hypoxia: Resolved Etiology likely secondary to hemoptysis with pneumomediastinum and left pneumothorax as below.  Required admission to the critical care service and mechanical ventilation/intubation.  Patient was initially intubated on 7/23 and successfully extubated on 7/25.  Requirement has been stable on 4 L nasal cannula with SPO2 100%.  Likely can continue to titrate down  further after discharge.   COVID-19 viral pneumonia Multifocal pneumonia On initial presentation, patient was treated with IV meropenem which was transitioned to IV Unasyn and completed treatment course.  During hospital course patient also tested positive for COVID-19 and completed course of remdesivir and Decadron.  Continue Augmentin on discharge to complete 10-day course.  Continues on supplemental oxygen via nasal cannula at 4 L.   MSSA bacteremia Unclear source, patient with respiratory symptoms and was requiring ventilatory support during her hospitalization. Sputum cultures negative.  Repeat blood cultures 8/23 no growth x5 days. Continue Linezolid '600mg'$  PO BID (End: 9/6 after 2 weeks course per infectious disease)   Left pneumothorax Pneumomediastinum Hemoptysis Chest tube was placed on 7/25 and subsequently removed on 7/28.  Patient's aspirin was discontinued.  No intervention for hemoptysis was performed which has now resolved.  Continue supplemental oxygen, 4 L nasal cannula.   Acute urinary retention: Resolved Foley catheter placed on 8/8, removed on 8/17.   Elevated troponin likely secondary to type II demand ischemia Etiology likely secondary to type II demand ischemia in the setting of active infections, hemoptysis, pneumothorax and pneumomediastinum.  Cardiology was consulted, transthoracic echocardiogram with normal LVEF and no wall motion normalities.  Cardiology now signed off.   Malpositioned left IJ catheter CT neck without focal injury, catheter removed.  Evaluated by vascular surgery with no further recommendations.   Primary essential hypertension Patient is on valsartan and amlodipine as outpatient which were discontinued in favor of metoprolol tartrate 12.5 mg p.o. twice daily.   Obesity Body mass index is 32.19 kg/m.Discussed with patient needs for aggressive lifestyle changes/weight loss as this complicates all facets of care.  Outpatient follow-up with PCP.     Discharge Diagnoses:  Principal Problem:   MSSA bacteremia Active Problems:   Hypertension   Acute  hypoxemic respiratory failure [facility])    Discharge Instructions  Discharge Instructions     Diet - low sodium heart healthy   Complete by: As directed    Increase activity slowly   Complete by: As directed    No wound care   Complete by: As directed       Allergies as of 08/03/2021       Reactions   Sulfamethoxazole-trimethoprim Shortness Of Breath, Itching   Percocet [oxycodone-acetaminophen] Other (See Comments)   Unknown reaction   Zocor [simvastatin] Other (See Comments)   Unknown reaction        Medication List     STOP taking these medications    amLODipine 5 MG tablet Commonly known as: NORVASC   valsartan 40 MG tablet Commonly known as: DIOVAN       TAKE these medications    amoxicillin-clavulanate 875-125 MG tablet Commonly known as: Augmentin Take 1 tablet by mouth 2 (two) times daily for 8 days.   aspirin EC 81 MG tablet Take 1 tablet (81 mg total) by mouth at bedtime. Swallow whole.   Ipratropium-Albuterol 20-100 MCG/ACT Aers respimat Commonly known as: COMBIVENT Inhale 1 puff into the lungs every 6 (six) hours as needed for wheezing.   linezolid 600 MG tablet Commonly known as: ZYVOX Take 1 tablet (600 mg total) by mouth every 12 (twelve) hours for 6 days.   melatonin 3 MG Tabs tablet Take 1 tablet (3 mg total) by mouth at bedtime as needed.   metoprolol tartrate 25 MG tablet Commonly known as: LOPRESSOR Take 0.5 tablets (12.5 mg total) by mouth 2 (two) times daily.   pantoprazole 40 MG tablet Commonly known as: PROTONIX Take 1 tablet (40 mg total) by mouth 2 (two) times daily before a meal for 60 days, THEN 1 tablet (40 mg total) daily. Start taking on: August 03, 2021   VITAMIN B-12 PO Take 1 tablet by mouth at bedtime.        Follow-up Information     Burnard Bunting, MD Follow up in 1 week(s).   Specialty: Internal  Medicine Contact information: [place] [address] [phone]         [place], Peter M, MD .   Specialty: Cardiology Contact information: [place] [address] [phone]         Burnard Bunting, MD Follow up in 1 week(s).   Specialty: Internal Medicine Contact information: [place] [address] [phone]         [place], Peter M, MD .   Specialty: Cardiology Contact information: [address] [place] [address] [phone]                Allergies  Allergen Reactions   Sulfamethoxazole-Trimethoprim Shortness Of Breath and Itching   Percocet [Oxycodone-Acetaminophen] Other (See Comments)    Unknown reaction   Zocor [Simvastatin] Other (See Comments)    Unknown reaction    Consultations: PCCM Infectious disease Palliative care Cardiology Vascular surgery, Dr. Scot Dock   Procedures/Studies: DG Chest 2 View  Result Date: 07/05/2021 CLINICAL DATA:  Follow-up pneumothorax. EXAM: CHEST - 2 VIEW COMPARISON:  06/29/2021 FINDINGS: Lucency in the right lung base shows interval improvement likely loculated sub pulmonic pneumothorax. Small residual right effusion. Progressive right upper lobe airspace disease with adjacent pleural thickening. Possible pneumonia. No pneumothorax on the left. Left lower lobe airspace disease with mild progression. Small left effusion. IMPRESSION: Improving right sub pulmonic pneumothorax Progression of right upper  lobe infiltrate and pleural thickening possible pneumonia. Mild progression of left lower lobe airspace disease. Electronically Signed   By: Franchot Gallo M.D.   On: 07/05/2021 15:46   CT CHEST WO CONTRAST  Result Date: 08/01/2021 CLINICAL DATA:  Cough and persistent shortness of breath. EXAM: CT CHEST WITHOUT CONTRAST TECHNIQUE: Multidetector CT imaging of the chest was performed following the standard protocol without IV contrast.  COMPARISON:  06/25/2021 FINDINGS: Cardiovascular: Heart size upper normal. No substantial pericardial effusion. Coronary artery calcification is evident. Moderate atherosclerotic calcification is noted in the wall of the thoracic aorta. Mediastinum/Nodes: Scattered small mediastinal lymph nodes again noted. The esophagus has normal imaging features. No evidence for gross hilar lymphadenopathy although assessment is limited by the lack of intravenous contrast on the current study. Tiny hiatal hernia noted. There is no axillary lymphadenopathy. Lungs/Pleura: Interval progression of collapse/consolidation in the right lower lobe and right middle lobe with volume loss in the right hemithorax. Pneumomediastinum and pleural gas seen on the previous exam has resolved in the interval. 2.2 x 1.3 cm nodular focus of consolidative opacity identified posterior left upper lobe on image 40/4. Scattered areas of architectural distortion in scarring are seen in the left lung with left lower lobe consolidation and associated volume loss. Posterior lower lobe airways bilaterally are impacted. Upper Abdomen: Unremarkable. Musculoskeletal: No worrisome lytic or sclerotic osseous abnormality. Thoracic spinal stimulator device evident. IMPRESSION: 1. Interval progression of collapse/consolidation involving both lower lobes, right greater than left. There is some associated consolidation of the right middle lobe. Posterior lower lobe airways bilaterally show evidence of impaction. Multifocal pneumonia would be a consideration. 2. 2.2 x 1.3 cm nodular focus of consolidative opacity posterior left upper lobe. Likely infectious/inflammatory, follow-up recommended to ensure resolution. 3. Areas of architectural distortion/scarring noted in the lungs bilaterally. 4. Interval resolution of the pneumomediastinum and pleural gas seen on the previous exam. 5. Aortic Atherosclerosis (ICD10-I70.0). Electronically Signed   By: Misty Stanley M.D.    On: 08/01/2021 12:57   DG CHEST PORT 1 VIEW  Result Date: 07/24/2021 CLINICAL DATA:  Cough, wheezing. EXAM: PORTABLE CHEST 1 VIEW COMPARISON:  July 17, 2021. FINDINGS: Stable cardiomediastinal silhouette. No pneumothorax is noted. Stable bilateral lung opacities are noted concerning for multifocal pneumonia. Small bilateral pleural effusions are noted. Bony thorax is unremarkable. IMPRESSION: Stable bilateral lung opacities as described above. Aortic Atherosclerosis (ICD10-I70.0). Electronically Signed   By: Marijo Conception M.D.   On: 07/24/2021 11:59   DG CHEST PORT 1 VIEW  Result Date: 07/17/2021 CLINICAL DATA:  Shortness of breath, hemoptysis. EXAM: PORTABLE CHEST 1 VIEW COMPARISON:  July 05, 2021. FINDINGS: Stable cardiomediastinal silhouette. No pneumothorax is noted. Stable peripheral right upper lobe opacity is noted. Stable bibasilar opacities are noted. No definite pneumothorax is noted. Probable small pleural effusions are noted. Bony thorax is unremarkable. IMPRESSION: Grossly stable bilateral lung opacities are noted with probable small bilateral pleural effusions. Aortic Atherosclerosis (ICD10-I70.0). Electronically Signed   By: Marijo Conception M.D.   On: 07/17/2021 09:04   ECHOCARDIOGRAM LIMITED  Result Date: 07/25/2021    ECHOCARDIOGRAM LIMITED REPORT   Patient Name:   [facility] Date of Exam: 07/25/2021 Medical Rec #:  [id number]           Height:       62.0 in Accession #:    [id number]          Weight:       165.0 lb Date of Birth:  24-May-1930  BSA:          1.762 m Patient Age:    48 years            BP:           132/65 mmHg Patient Gender: F                   HR:           95 bpm. Exam Location:  Inpatient Procedure: Limited Echo, Limited Color Doppler and Color Doppler Indications:    Bacteremia  History:        Patient has prior history of Echocardiogram examinations. Risk                 Factors:Hypertension, Dyslipidemia and Former Smoker. Covid 19                  positive.  Sonographer:    Merrie Roof RDCS Referring Phys: [place]  1. Left ventricular ejection fraction, by estimation, is 60 to 65%. The left ventricle has normal function. There is mild left ventricular hypertrophy.  2. Right ventricular systolic function is normal. The right ventricular size is normal. There is normal pulmonary artery systolic pressure.  3. The mitral valve is degenerative. Mild mitral valve regurgitation.  4. The aortic valve is abnormal. There is mild calcification of the aortic valve. There is moderate thickening of the aortic valve. Aortic valve regurgitation is trivial.  5. The inferior vena cava is normal in size with greater than 50% respiratory variability, suggesting right atrial pressure of 3 mmHg. Conclusion(s)/Recommendation(s): No evidence of valvular vegetations on this transthoracic echocardiogram. FINDINGS  Left Ventricle: Left ventricular ejection fraction, by estimation, is 60 to 65%. The left ventricle has normal function. There is mild left ventricular hypertrophy. Right Ventricle: The right ventricular size is normal. No increase in right ventricular wall thickness. Right ventricular systolic function is normal. There is normal pulmonary artery systolic pressure. The tricuspid regurgitant velocity is 2.85 m/s, and  with an assumed right atrial pressure of 3 mmHg, the estimated right ventricular systolic pressure is [id number] mmHg. Mitral Valve: The mitral valve is degenerative in appearance. Mild to moderate mitral annular calcification. Mild mitral valve regurgitation. Tricuspid Valve: The tricuspid valve is grossly normal. Tricuspid valve regurgitation is mild. Aortic Valve: The aortic valve is abnormal. There is mild calcification of the aortic valve. There is moderate thickening of the aortic valve. Aortic valve regurgitation is trivial. Pulmonic Valve: The pulmonic valve was thickened with good excursion. Pulmonic valve regurgitation is mild.  Aorta: The aortic root is normal in size and structure. Venous: The inferior vena cava is normal in size with greater than 50% respiratory variability, suggesting right atrial pressure of 3 mmHg. LEFT VENTRICLE PLAX 2D LVIDd:         3.60 cm LVIDs:         2.20 cm LV PW:         1.00 cm LV IVS:        1.20 cm  LEFT ATRIUM         Index LA diam:    2.80 cm 1.59 cm/m   AORTA Ao Root diam: 3.20 cm TRICUSPID VALVE TR Peak grad:   32.5 mmHg TR Vmax:        285.00 cm/s Cherlynn Kaiser MD Electronically signed by Cherlynn Kaiser MD Signature Date/Time: 07/25/2021/4:56:35 PM    Final      Subjective: Patient seen examined bedside, resting  comfortably in bedside chair.  Eating breakfast.  No complaints this morning.  Discharging to SNF today.  No family present at bedside this morning.  No other concerns or questions at this time.  Denies headache, no fever/chills/night sweats, no nausea/vomiting/diarrhea, no chest pain, palpitations, no abdominal pain, no weakness, no fatigue, no paresthesias.  No acute events overnight per nursing staff.  Discharge Exam: Vitals:   08/03/21 0430 08/03/21 0744  BP: 138/83 (!) 157/78  Pulse: 90 100  Resp: 16 18  Temp: 98.2 F (36.8 C) 98.1 F (36.7 C)  SpO2: 100% 95%   Vitals:   08/02/21 1943 08/02/21 2351 08/03/21 0430 08/03/21 0744  BP: (!) 143/69 125/74 138/83 (!) 157/78  Pulse: (!) 110 (!) 110 90 100  Resp: '15 14 16 18  '$ Temp: 98.4 F (36.9 C) 97.9 F (36.6 C) 98.2 F (36.8 C) 98.1 F (36.7 C)  TempSrc: Oral Oral Oral Oral  SpO2: 100% 90% 100% 95%  Weight:   79.8 kg   Height:        General: Pt is alert, awake, not in acute distress Cardiovascular: RRR, S1/S2 +, no rubs, no gallops Respiratory: Coarse breath sounds bilaterally, no wheezing/crackles, normal respiratory effort without accessory muscle use, on 4 L nasal cannula Abdominal: Soft, NT, ND, bowel sounds + Extremities: no edema, no cyanosis    The results of significant diagnostics from this  hospitalization (including imaging, microbiology, ancillary and laboratory) are listed below for reference.     Microbiology: Recent Results (from the past 240 hour(s))  Culture, blood (routine x 2)     Status: Abnormal   Collection Time: 07/24/21 10:39 AM   Specimen: BLOOD  Result Value Ref Range Status   Specimen Description BLOOD LEFT ANTECUBITAL  Final   Special Requests   Final    BOTTLES DRAWN AEROBIC AND ANAEROBIC Blood Culture adequate volume   Culture  Setup Time   Final    GRAM POSITIVE COCCI IN CLUSTERS IN BOTH AEROBIC AND ANAEROBIC BOTTLES    Culture (A)  Final    STAPHYLOCOCCUS AUREUS SUSCEPTIBILITIES PERFORMED ON PREVIOUS CULTURE WITHIN THE LAST 5 DAYS. Performed at [facility], [place] [address], [place], [place] [address]    Report Status 07/27/2021 FINAL  Final  Culture, blood (routine x 2)     Status: Abnormal   Collection Time: 07/24/21 10:48 AM   Specimen: BLOOD  Result Value Ref Range Status   Specimen Description BLOOD RIGHT ANTECUBITAL  Final   Special Requests   Final    BOTTLES DRAWN AEROBIC AND ANAEROBIC Blood Culture adequate volume   Culture  Setup Time   Final    GRAM POSITIVE COCCI IN CLUSTERS IN BOTH AEROBIC AND ANAEROBIC BOTTLES CRITICAL RESULT CALLED TO, READ BACK BY AND VERIFIED WITH: L SEAY,PHARMD'@0629'$  07/25/21 [place] Performed at [facility], [place] [address] [place], [place] [address]    Culture STAPHYLOCOCCUS AUREUS (A)  Final   Report Status 07/27/2021 FINAL  Final   Organism ID, Bacteria STAPHYLOCOCCUS AUREUS  Final      Susceptibility   Staphylococcus aureus - MIC*    CIPROFLOXACIN <=0.5 SENSITIVE Sensitive     ERYTHROMYCIN <=0.25 SENSITIVE Sensitive     GENTAMICIN <=0.5 SENSITIVE Sensitive     OXACILLIN 0.5 SENSITIVE Sensitive     TETRACYCLINE <=1 SENSITIVE Sensitive     VANCOMYCIN 1 SENSITIVE Sensitive     TRIMETH/SULFA <=10 SENSITIVE Sensitive     CLINDAMYCIN <=0.25 SENSITIVE Sensitive  RIFAMPIN <=0.5 SENSITIVE  Sensitive     Inducible Clindamycin NEGATIVE Sensitive     * STAPHYLOCOCCUS AUREUS  Blood Culture ID Panel (Reflexed)     Status: Abnormal   Collection Time: 07/24/21 10:48 AM  Result Value Ref Range Status   Enterococcus faecalis NOT DETECTED NOT DETECTED Final   Enterococcus Faecium NOT DETECTED NOT DETECTED Final   Listeria monocytogenes NOT DETECTED NOT DETECTED Final   Staphylococcus species DETECTED (A) NOT DETECTED Final    Comment: CRITICAL RESULT CALLED TO, READ BACK BY AND VERIFIED WITH: L SEAY,PHARMD'@0630'$  07/25/21 [place]    Staphylococcus aureus (BCID) DETECTED (A) NOT DETECTED Final    Comment: CRITICAL RESULT CALLED TO, READ BACK BY AND VERIFIED WITH: L SEAY,PHARMD'@0630'$  07/25/21 [place]    Staphylococcus epidermidis NOT DETECTED NOT DETECTED Final   Staphylococcus lugdunensis NOT DETECTED NOT DETECTED Final   Streptococcus species NOT DETECTED NOT DETECTED Final   Streptococcus agalactiae NOT DETECTED NOT DETECTED Final   Streptococcus pneumoniae NOT DETECTED NOT DETECTED Final   Streptococcus pyogenes NOT DETECTED NOT DETECTED Final   A.calcoaceticus-baumannii NOT DETECTED NOT DETECTED Final   Bacteroides fragilis NOT DETECTED NOT DETECTED Final   Enterobacterales NOT DETECTED NOT DETECTED Final   Enterobacter cloacae complex NOT DETECTED NOT DETECTED Final   Escherichia coli NOT DETECTED NOT DETECTED Final   Klebsiella aerogenes NOT DETECTED NOT DETECTED Final   Klebsiella oxytoca NOT DETECTED NOT DETECTED Final   Klebsiella pneumoniae NOT DETECTED NOT DETECTED Final   Proteus species NOT DETECTED NOT DETECTED Final   Salmonella species NOT DETECTED NOT DETECTED Final   Serratia marcescens NOT DETECTED NOT DETECTED Final   Haemophilus influenzae NOT DETECTED NOT DETECTED Final   Neisseria meningitidis NOT DETECTED NOT DETECTED Final   Pseudomonas aeruginosa NOT DETECTED NOT DETECTED Final   Stenotrophomonas maltophilia NOT DETECTED NOT DETECTED Final   Candida albicans NOT  DETECTED NOT DETECTED Final   Candida auris NOT DETECTED NOT DETECTED Final   Candida glabrata NOT DETECTED NOT DETECTED Final   Candida krusei NOT DETECTED NOT DETECTED Final   Candida parapsilosis NOT DETECTED NOT DETECTED Final   Candida tropicalis NOT DETECTED NOT DETECTED Final   Cryptococcus neoformans/gattii NOT DETECTED NOT DETECTED Final   Meth resistant mecA/C and MREJ NOT DETECTED NOT DETECTED Final    Comment: Performed at [facility], [address], [place], [place] [address]  Culture, blood (routine x 2)     Status: None   Collection Time: 07/25/21  8:06 AM   Specimen: BLOOD  Result Value Ref Range Status   Specimen Description BLOOD LEFT ANTECUBITAL  Final   Special Requests   Final    BOTTLES DRAWN AEROBIC AND ANAEROBIC Blood Culture adequate volume   Culture   Final    NO GROWTH 5 DAYS Performed at [facility], [place] [address] [place], [place] [address]    Report Status 07/30/2021 FINAL  Final  Culture, blood (routine x 2)     Status: None   Collection Time: 07/25/21  8:06 AM   Specimen: BLOOD  Result Value Ref Range Status   Specimen Description BLOOD LEFT ANTECUBITAL  Final   Special Requests   Final    BOTTLES DRAWN AEROBIC AND ANAEROBIC Blood Culture adequate volume   Culture   Final    NO GROWTH 5 DAYS Performed at [facility], [place] [address] [place], [place] [address]    Report Status 07/30/2021 FINAL  Final  Labs: BNP (last 3 results) Recent Labs    06/24/21 2230 07/06/21 0207  BNP 100.7* [id number]*   Basic Metabolic Panel: No results for input(s): NA, K, CL, CO2, GLUCOSE, BUN, CREATININE, CALCIUM, MG, PHOS in the last 168 hours. Liver Function Tests: No results for input(s): AST, ALT, ALKPHOS, BILITOT, PROT, ALBUMIN in the last 168 hours. No results for input(s): LIPASE, AMYLASE in the last 168 hours. No results for input(s): AMMONIA in the last 168 hours. CBC: Recent Labs  Lab 08/01/21 0827  WBC 11.6*  NEUTROABS  9.2*  HGB 8.9*  HCT 27.4*  MCV 92.9  PLT 299   Cardiac Enzymes: No results for input(s): CKTOTAL, CKMB, CKMBINDEX, TROPONINI in the last 168 hours. BNP: Invalid input(s): POCBNP CBG: No results for input(s): GLUCAP in the last 168 hours. D-Dimer No results for input(s): DDIMER in the last 72 hours. Hgb A1c No results for input(s): HGBA1C in the last 72 hours. Lipid Profile No results for input(s): CHOL, HDL, LDLCALC, TRIG, CHOLHDL, LDLDIRECT in the last 72 hours. Thyroid function studies No results for input(s): TSH, T4TOTAL, T3FREE, THYROIDAB in the last 72 hours.  Invalid input(s): FREET3 Anemia work up No results for input(s): VITAMINB12, FOLATE, FERRITIN, TIBC, IRON, RETICCTPCT in the last 72 hours. Urinalysis    Component Value Date/Time   COLORURINE YELLOW 06/25/2021 0543   APPEARANCEUR HAZY (A) 06/25/2021 0543   LABSPEC 1.039 (H) 06/25/2021 0543   PHURINE 6.0 06/25/2021 0543   GLUCOSEU NEGATIVE 06/25/2021 0543   HGBUR SMALL (A) 06/25/2021 0543   BILIRUBINUR NEGATIVE 06/25/2021 0543   KETONESUR NEGATIVE 06/25/2021 0543   PROTEINUR NEGATIVE 06/25/2021 0543   NITRITE NEGATIVE 06/25/2021 0543   LEUKOCYTESUR LARGE (A) 06/25/2021 0543   Sepsis Labs Invalid input(s): PROCALCITONIN,  WBC,  LACTICIDVEN Microbiology Recent Results (from the past 240 hour(s))  Culture, blood (routine x 2)     Status: Abnormal   Collection Time: 07/24/21 10:39 AM   Specimen: BLOOD  Result Value Ref Range Status   Specimen Description BLOOD LEFT ANTECUBITAL  Final   Special Requests   Final    BOTTLES DRAWN AEROBIC AND ANAEROBIC Blood Culture adequate volume   Culture  Setup Time   Final    GRAM POSITIVE COCCI IN CLUSTERS IN BOTH AEROBIC AND ANAEROBIC BOTTLES    Culture (A)  Final    STAPHYLOCOCCUS AUREUS SUSCEPTIBILITIES PERFORMED ON PREVIOUS CULTURE WITHIN THE LAST 5 DAYS. Performed at [facility], [place] [address], [place], [place] [address]    Report Status 07/27/2021  FINAL  Final  Culture, blood (routine x 2)     Status: Abnormal   Collection Time: 07/24/21 10:48 AM   Specimen: BLOOD  Result Value Ref Range Status   Specimen Description BLOOD RIGHT ANTECUBITAL  Final   Special Requests   Final    BOTTLES DRAWN AEROBIC AND ANAEROBIC Blood Culture adequate volume   Culture  Setup Time   Final    GRAM POSITIVE COCCI IN CLUSTERS IN BOTH AEROBIC AND ANAEROBIC BOTTLES CRITICAL RESULT CALLED TO, READ BACK BY AND VERIFIED WITH: L SEAY,PHARMD'@0629'$  07/25/21 [place] Performed at [facility], [place] [address] [place], [place] [address]    Culture STAPHYLOCOCCUS AUREUS (A)  Final   Report Status 07/27/2021 FINAL  Final   Organism ID, Bacteria STAPHYLOCOCCUS AUREUS  Final      Susceptibility   Staphylococcus aureus - MIC*    CIPROFLOXACIN <=0.5 SENSITIVE Sensitive     ERYTHROMYCIN <=0.25 SENSITIVE Sensitive  GENTAMICIN <=0.5 SENSITIVE Sensitive     OXACILLIN 0.5 SENSITIVE Sensitive     TETRACYCLINE <=1 SENSITIVE Sensitive     VANCOMYCIN 1 SENSITIVE Sensitive     TRIMETH/SULFA <=10 SENSITIVE Sensitive     CLINDAMYCIN <=0.25 SENSITIVE Sensitive     RIFAMPIN <=0.5 SENSITIVE Sensitive     Inducible Clindamycin NEGATIVE Sensitive     * STAPHYLOCOCCUS AUREUS  Blood Culture ID Panel (Reflexed)     Status: Abnormal   Collection Time: 07/24/21 10:48 AM  Result Value Ref Range Status   Enterococcus faecalis NOT DETECTED NOT DETECTED Final   Enterococcus Faecium NOT DETECTED NOT DETECTED Final   Listeria monocytogenes NOT DETECTED NOT DETECTED Final   Staphylococcus species DETECTED (A) NOT DETECTED Final    Comment: CRITICAL RESULT CALLED TO, READ BACK BY AND VERIFIED WITH: L SEAY,PHARMD'@0630'$  07/25/21 [place]    Staphylococcus aureus (BCID) DETECTED (A) NOT DETECTED Final    Comment: CRITICAL RESULT CALLED TO, READ BACK BY AND VERIFIED WITH: L SEAY,PHARMD'@0630'$  07/25/21 [place]    Staphylococcus epidermidis NOT DETECTED NOT DETECTED Final   Staphylococcus  lugdunensis NOT DETECTED NOT DETECTED Final   Streptococcus species NOT DETECTED NOT DETECTED Final   Streptococcus agalactiae NOT DETECTED NOT DETECTED Final   Streptococcus pneumoniae NOT DETECTED NOT DETECTED Final   Streptococcus pyogenes NOT DETECTED NOT DETECTED Final   A.calcoaceticus-baumannii NOT DETECTED NOT DETECTED Final   Bacteroides fragilis NOT DETECTED NOT DETECTED Final   Enterobacterales NOT DETECTED NOT DETECTED Final   Enterobacter cloacae complex NOT DETECTED NOT DETECTED Final   Escherichia coli NOT DETECTED NOT DETECTED Final   Klebsiella aerogenes NOT DETECTED NOT DETECTED Final   Klebsiella oxytoca NOT DETECTED NOT DETECTED Final   Klebsiella pneumoniae NOT DETECTED NOT DETECTED Final   Proteus species NOT DETECTED NOT DETECTED Final   Salmonella species NOT DETECTED NOT DETECTED Final   Serratia marcescens NOT DETECTED NOT DETECTED Final   Haemophilus influenzae NOT DETECTED NOT DETECTED Final   Neisseria meningitidis NOT DETECTED NOT DETECTED Final   Pseudomonas aeruginosa NOT DETECTED NOT DETECTED Final   Stenotrophomonas maltophilia NOT DETECTED NOT DETECTED Final   Candida albicans NOT DETECTED NOT DETECTED Final   Candida auris NOT DETECTED NOT DETECTED Final   Candida glabrata NOT DETECTED NOT DETECTED Final   Candida krusei NOT DETECTED NOT DETECTED Final   Candida parapsilosis NOT DETECTED NOT DETECTED Final   Candida tropicalis NOT DETECTED NOT DETECTED Final   Cryptococcus neoformans/gattii NOT DETECTED NOT DETECTED Final   Meth resistant mecA/C and MREJ NOT DETECTED NOT DETECTED Final    Comment: Performed at [facility], [address] [place],  [address]  Culture, blood (routine x 2)     Status: None   Collection Time: 07/25/21  8:06 AM   Specimen: BLOOD  Result Value Ref Range Status   Specimen Description BLOOD LEFT ANTECUBITAL  Final   Special Requests   Final    BOTTLES DRAWN AEROBIC AND ANAEROBIC Blood Culture adequate  volume   Culture   Final    NO GROWTH 5 DAYS Performed at [facility], [place] [address] [place],  [address]    Report Status 07/30/2021 FINAL  Final  Culture, blood (routine x 2)     Status: None   Collection Time: 07/25/21  8:06 AM   Specimen: BLOOD  Result Value Ref Range Status   Specimen Description BLOOD LEFT ANTECUBITAL  Final   Special Requests   Final  BOTTLES DRAWN AEROBIC AND ANAEROBIC Blood Culture adequate volume   Culture   Final    NO GROWTH 5 DAYS Performed at [facility], [place] [address] [place], [place] [address]    Report Status 07/30/2021 FINAL  Final     Time coordinating discharge: Over 30 minutes  SIGNED:   Donnamarie Poag [place] DO  [facility] 08/03/2021, 10:03 AM

## 2021-08-03 NOTE — Progress Notes (Signed)
Pt discharging to SNF via ambulance transport.  Report called to receiving nurse and bedside report given to transport personnel.   Pt dressed in personal clothes and sent with all personal belongings found in room.  Per SW dtr [name] is aware and plans to meet Pt at SNF.

## 2021-08-03 NOTE — TOC Transition Note (Signed)
Transition of Care [facility]) - CM/SW Discharge Note   Patient Details  Name: Khadejah Liggon MRN: [id number] Date of Birth: 1930/07/18  [facility]) CM/SW Contact:  Geralynn Ochs, LCSW Phone Number: 08/03/2021, 10:45 AM   Clinical Narrative:   Nurse to call report to [phone].    Final next level of care: Skilled Nursing Facility Barriers to Discharge: Barriers Resolved   Patient Goals and CMS Choice Patient states their goals for this hospitalization and ongoing recovery are:: intubated CMS Medicare.gov Compare Post Acute Care list provided to:: Patient    Discharge Placement              Patient chooses bed at:  [facility]) Patient to be transferred to facility by: [facility] Name of family member notified: [name] Patient and family notified of of transfer: 08/03/21  Discharge Plan and Services In-house Referral: Clinical Social Work                                   Social Determinants of Health ([place]) Interventions     Readmission Risk Interventions No flowsheet data found.

## 2021-08-04 DIAGNOSIS — R0989 Other specified symptoms and signs involving the circulatory and respiratory systems: Secondary | ICD-10-CM | POA: Diagnosis not present

## 2021-08-04 DIAGNOSIS — Z79899 Other long term (current) drug therapy: Secondary | ICD-10-CM | POA: Diagnosis not present

## 2021-08-04 DIAGNOSIS — R0902 Hypoxemia: Secondary | ICD-10-CM | POA: Diagnosis not present

## 2021-08-04 DIAGNOSIS — Z8616 Personal history of COVID-19: Secondary | ICD-10-CM | POA: Diagnosis not present

## 2021-08-04 DIAGNOSIS — I214 Non-ST elevation (NSTEMI) myocardial infarction: Secondary | ICD-10-CM | POA: Diagnosis not present

## 2021-08-04 DIAGNOSIS — I509 Heart failure, unspecified: Secondary | ICD-10-CM | POA: Diagnosis not present

## 2021-08-04 DIAGNOSIS — R059 Cough, unspecified: Secondary | ICD-10-CM | POA: Diagnosis not present

## 2021-08-04 DIAGNOSIS — J449 Chronic obstructive pulmonary disease, unspecified: Secondary | ICD-10-CM | POA: Diagnosis not present

## 2021-08-04 DIAGNOSIS — Z7982 Long term (current) use of aspirin: Secondary | ICD-10-CM | POA: Diagnosis not present

## 2021-08-04 DIAGNOSIS — J90 Pleural effusion, not elsewhere classified: Secondary | ICD-10-CM | POA: Diagnosis not present

## 2021-08-04 DIAGNOSIS — R0602 Shortness of breath: Secondary | ICD-10-CM | POA: Diagnosis not present

## 2021-08-04 DIAGNOSIS — Z87891 Personal history of nicotine dependence: Secondary | ICD-10-CM | POA: Diagnosis not present

## 2021-08-05 DIAGNOSIS — I10 Essential (primary) hypertension: Secondary | ICD-10-CM | POA: Diagnosis not present

## 2021-08-05 DIAGNOSIS — R7989 Other specified abnormal findings of blood chemistry: Secondary | ICD-10-CM | POA: Diagnosis not present

## 2021-08-05 DIAGNOSIS — I471 Supraventricular tachycardia: Secondary | ICD-10-CM | POA: Diagnosis not present

## 2021-08-05 DIAGNOSIS — R9430 Abnormal result of cardiovascular function study, unspecified: Secondary | ICD-10-CM | POA: Diagnosis not present

## 2021-08-05 DIAGNOSIS — Z79899 Other long term (current) drug therapy: Secondary | ICD-10-CM | POA: Diagnosis not present

## 2021-08-05 DIAGNOSIS — J9690 Respiratory failure, unspecified, unspecified whether with hypoxia or hypercapnia: Secondary | ICD-10-CM | POA: Diagnosis not present

## 2021-08-05 DIAGNOSIS — Z7982 Long term (current) use of aspirin: Secondary | ICD-10-CM | POA: Diagnosis not present

## 2021-08-05 DIAGNOSIS — J209 Acute bronchitis, unspecified: Secondary | ICD-10-CM | POA: Diagnosis not present

## 2021-08-05 DIAGNOSIS — I110 Hypertensive heart disease with heart failure: Secondary | ICD-10-CM | POA: Diagnosis not present

## 2021-08-05 DIAGNOSIS — I5021 Acute systolic (congestive) heart failure: Secondary | ICD-10-CM | POA: Diagnosis not present

## 2021-08-05 DIAGNOSIS — I493 Ventricular premature depolarization: Secondary | ICD-10-CM | POA: Diagnosis not present

## 2021-08-05 DIAGNOSIS — Z20822 Contact with and (suspected) exposure to covid-19: Secondary | ICD-10-CM | POA: Diagnosis not present

## 2021-08-05 DIAGNOSIS — Z87891 Personal history of nicotine dependence: Secondary | ICD-10-CM | POA: Diagnosis not present

## 2021-08-05 DIAGNOSIS — Z8616 Personal history of COVID-19: Secondary | ICD-10-CM | POA: Diagnosis not present

## 2021-08-05 DIAGNOSIS — J9621 Acute and chronic respiratory failure with hypoxia: Secondary | ICD-10-CM | POA: Diagnosis not present

## 2021-08-05 DIAGNOSIS — I519 Heart disease, unspecified: Secondary | ICD-10-CM | POA: Diagnosis not present

## 2021-08-05 DIAGNOSIS — J449 Chronic obstructive pulmonary disease, unspecified: Secondary | ICD-10-CM | POA: Diagnosis not present

## 2021-08-05 DIAGNOSIS — R059 Cough, unspecified: Secondary | ICD-10-CM | POA: Diagnosis not present

## 2021-08-05 DIAGNOSIS — Z66 Do not resuscitate: Secondary | ICD-10-CM | POA: Diagnosis not present

## 2021-08-05 DIAGNOSIS — I517 Cardiomegaly: Secondary | ICD-10-CM | POA: Diagnosis not present

## 2021-08-05 DIAGNOSIS — R0902 Hypoxemia: Secondary | ICD-10-CM | POA: Diagnosis not present

## 2021-08-05 DIAGNOSIS — R0602 Shortness of breath: Secondary | ICD-10-CM | POA: Diagnosis not present

## 2021-08-05 DIAGNOSIS — I4891 Unspecified atrial fibrillation: Secondary | ICD-10-CM | POA: Diagnosis not present

## 2021-08-05 DIAGNOSIS — R0989 Other specified symptoms and signs involving the circulatory and respiratory systems: Secondary | ICD-10-CM | POA: Diagnosis not present

## 2021-08-05 DIAGNOSIS — I361 Nonrheumatic tricuspid (valve) insufficiency: Secondary | ICD-10-CM | POA: Diagnosis not present

## 2021-08-05 DIAGNOSIS — I509 Heart failure, unspecified: Secondary | ICD-10-CM | POA: Diagnosis not present

## 2021-08-05 DIAGNOSIS — J90 Pleural effusion, not elsewhere classified: Secondary | ICD-10-CM | POA: Diagnosis not present

## 2021-08-05 DIAGNOSIS — R9431 Abnormal electrocardiogram [ECG] [EKG]: Secondary | ICD-10-CM | POA: Diagnosis not present

## 2021-08-05 DIAGNOSIS — I214 Non-ST elevation (NSTEMI) myocardial infarction: Secondary | ICD-10-CM | POA: Diagnosis not present

## 2021-08-17 DIAGNOSIS — I509 Heart failure, unspecified: Secondary | ICD-10-CM | POA: Diagnosis not present

## 2021-08-17 DIAGNOSIS — J449 Chronic obstructive pulmonary disease, unspecified: Secondary | ICD-10-CM | POA: Diagnosis not present

## 2021-08-17 DIAGNOSIS — I4891 Unspecified atrial fibrillation: Secondary | ICD-10-CM | POA: Diagnosis not present

## 2021-08-17 DIAGNOSIS — J9690 Respiratory failure, unspecified, unspecified whether with hypoxia or hypercapnia: Secondary | ICD-10-CM | POA: Diagnosis not present

## 2021-08-18 DIAGNOSIS — I509 Heart failure, unspecified: Secondary | ICD-10-CM | POA: Diagnosis not present

## 2021-08-18 DIAGNOSIS — I110 Hypertensive heart disease with heart failure: Secondary | ICD-10-CM | POA: Diagnosis not present

## 2021-08-18 DIAGNOSIS — J9601 Acute respiratory failure with hypoxia: Secondary | ICD-10-CM | POA: Diagnosis not present

## 2021-08-18 DIAGNOSIS — Z8616 Personal history of COVID-19: Secondary | ICD-10-CM | POA: Diagnosis not present

## 2021-08-18 DIAGNOSIS — J939 Pneumothorax, unspecified: Secondary | ICD-10-CM | POA: Diagnosis not present

## 2021-08-18 DIAGNOSIS — Z87891 Personal history of nicotine dependence: Secondary | ICD-10-CM | POA: Diagnosis not present

## 2021-08-18 DIAGNOSIS — J449 Chronic obstructive pulmonary disease, unspecified: Secondary | ICD-10-CM | POA: Diagnosis not present

## 2021-08-18 DIAGNOSIS — Z90710 Acquired absence of both cervix and uterus: Secondary | ICD-10-CM | POA: Diagnosis not present

## 2021-08-18 DIAGNOSIS — I341 Nonrheumatic mitral (valve) prolapse: Secondary | ICD-10-CM | POA: Diagnosis not present

## 2021-08-18 DIAGNOSIS — I4891 Unspecified atrial fibrillation: Secondary | ICD-10-CM | POA: Diagnosis not present

## 2021-08-21 DIAGNOSIS — J9601 Acute respiratory failure with hypoxia: Secondary | ICD-10-CM | POA: Diagnosis not present

## 2021-08-21 DIAGNOSIS — Z8616 Personal history of COVID-19: Secondary | ICD-10-CM | POA: Diagnosis not present

## 2021-08-21 DIAGNOSIS — J939 Pneumothorax, unspecified: Secondary | ICD-10-CM | POA: Diagnosis not present

## 2021-08-21 DIAGNOSIS — I341 Nonrheumatic mitral (valve) prolapse: Secondary | ICD-10-CM | POA: Diagnosis not present

## 2021-08-21 DIAGNOSIS — I110 Hypertensive heart disease with heart failure: Secondary | ICD-10-CM | POA: Diagnosis not present

## 2021-08-21 DIAGNOSIS — I4891 Unspecified atrial fibrillation: Secondary | ICD-10-CM | POA: Diagnosis not present

## 2021-08-21 DIAGNOSIS — Z87891 Personal history of nicotine dependence: Secondary | ICD-10-CM | POA: Diagnosis not present

## 2021-08-21 DIAGNOSIS — Z90710 Acquired absence of both cervix and uterus: Secondary | ICD-10-CM | POA: Diagnosis not present

## 2021-08-21 DIAGNOSIS — I509 Heart failure, unspecified: Secondary | ICD-10-CM | POA: Diagnosis not present

## 2021-08-21 DIAGNOSIS — J449 Chronic obstructive pulmonary disease, unspecified: Secondary | ICD-10-CM | POA: Diagnosis not present

## 2021-08-22 DIAGNOSIS — I4891 Unspecified atrial fibrillation: Secondary | ICD-10-CM | POA: Diagnosis not present

## 2021-08-22 DIAGNOSIS — J9601 Acute respiratory failure with hypoxia: Secondary | ICD-10-CM | POA: Diagnosis not present

## 2021-08-22 DIAGNOSIS — J449 Chronic obstructive pulmonary disease, unspecified: Secondary | ICD-10-CM | POA: Diagnosis not present

## 2021-08-22 DIAGNOSIS — M549 Dorsalgia, unspecified: Secondary | ICD-10-CM | POA: Diagnosis not present

## 2021-08-22 DIAGNOSIS — I10 Essential (primary) hypertension: Secondary | ICD-10-CM | POA: Diagnosis not present

## 2021-08-22 DIAGNOSIS — Z8616 Personal history of COVID-19: Secondary | ICD-10-CM | POA: Diagnosis not present

## 2021-08-22 DIAGNOSIS — Z87891 Personal history of nicotine dependence: Secondary | ICD-10-CM | POA: Diagnosis not present

## 2021-08-22 DIAGNOSIS — I509 Heart failure, unspecified: Secondary | ICD-10-CM | POA: Diagnosis not present

## 2021-08-22 DIAGNOSIS — J939 Pneumothorax, unspecified: Secondary | ICD-10-CM | POA: Diagnosis not present

## 2021-08-22 DIAGNOSIS — M1990 Unspecified osteoarthritis, unspecified site: Secondary | ICD-10-CM | POA: Diagnosis not present

## 2021-08-22 DIAGNOSIS — I110 Hypertensive heart disease with heart failure: Secondary | ICD-10-CM | POA: Diagnosis not present

## 2021-08-22 DIAGNOSIS — I341 Nonrheumatic mitral (valve) prolapse: Secondary | ICD-10-CM | POA: Diagnosis not present

## 2021-08-22 DIAGNOSIS — Z90710 Acquired absence of both cervix and uterus: Secondary | ICD-10-CM | POA: Diagnosis not present

## 2021-08-24 DIAGNOSIS — J449 Chronic obstructive pulmonary disease, unspecified: Secondary | ICD-10-CM | POA: Diagnosis not present

## 2021-08-25 DIAGNOSIS — J449 Chronic obstructive pulmonary disease, unspecified: Secondary | ICD-10-CM | POA: Diagnosis not present

## 2021-08-25 DIAGNOSIS — I110 Hypertensive heart disease with heart failure: Secondary | ICD-10-CM | POA: Diagnosis not present

## 2021-08-25 DIAGNOSIS — I4891 Unspecified atrial fibrillation: Secondary | ICD-10-CM | POA: Diagnosis not present

## 2021-08-25 DIAGNOSIS — J9601 Acute respiratory failure with hypoxia: Secondary | ICD-10-CM | POA: Diagnosis not present

## 2021-08-25 DIAGNOSIS — J939 Pneumothorax, unspecified: Secondary | ICD-10-CM | POA: Diagnosis not present

## 2021-08-25 DIAGNOSIS — Z87891 Personal history of nicotine dependence: Secondary | ICD-10-CM | POA: Diagnosis not present

## 2021-08-25 DIAGNOSIS — I341 Nonrheumatic mitral (valve) prolapse: Secondary | ICD-10-CM | POA: Diagnosis not present

## 2021-08-25 DIAGNOSIS — I509 Heart failure, unspecified: Secondary | ICD-10-CM | POA: Diagnosis not present

## 2021-08-25 DIAGNOSIS — Z8616 Personal history of COVID-19: Secondary | ICD-10-CM | POA: Diagnosis not present

## 2021-08-25 DIAGNOSIS — Z90710 Acquired absence of both cervix and uterus: Secondary | ICD-10-CM | POA: Diagnosis not present

## 2021-08-28 DIAGNOSIS — I517 Cardiomegaly: Secondary | ICD-10-CM | POA: Diagnosis not present

## 2021-08-28 DIAGNOSIS — I509 Heart failure, unspecified: Secondary | ICD-10-CM | POA: Diagnosis not present

## 2021-08-29 DIAGNOSIS — Z0130 Encounter for examination of blood pressure without abnormal findings: Secondary | ICD-10-CM | POA: Diagnosis not present

## 2021-08-29 DIAGNOSIS — Z87891 Personal history of nicotine dependence: Secondary | ICD-10-CM | POA: Diagnosis not present

## 2021-08-29 DIAGNOSIS — I509 Heart failure, unspecified: Secondary | ICD-10-CM | POA: Diagnosis not present

## 2021-08-29 DIAGNOSIS — J939 Pneumothorax, unspecified: Secondary | ICD-10-CM | POA: Diagnosis not present

## 2021-08-29 DIAGNOSIS — I110 Hypertensive heart disease with heart failure: Secondary | ICD-10-CM | POA: Diagnosis not present

## 2021-08-29 DIAGNOSIS — J449 Chronic obstructive pulmonary disease, unspecified: Secondary | ICD-10-CM | POA: Diagnosis not present

## 2021-08-29 DIAGNOSIS — Z139 Encounter for screening, unspecified: Secondary | ICD-10-CM | POA: Diagnosis not present

## 2021-08-29 DIAGNOSIS — I4891 Unspecified atrial fibrillation: Secondary | ICD-10-CM | POA: Diagnosis not present

## 2021-08-29 DIAGNOSIS — J9601 Acute respiratory failure with hypoxia: Secondary | ICD-10-CM | POA: Diagnosis not present

## 2021-08-29 DIAGNOSIS — Z8616 Personal history of COVID-19: Secondary | ICD-10-CM | POA: Diagnosis not present

## 2021-08-29 DIAGNOSIS — Z90710 Acquired absence of both cervix and uterus: Secondary | ICD-10-CM | POA: Diagnosis not present

## 2021-08-29 DIAGNOSIS — I341 Nonrheumatic mitral (valve) prolapse: Secondary | ICD-10-CM | POA: Diagnosis not present

## 2021-08-30 DIAGNOSIS — I110 Hypertensive heart disease with heart failure: Secondary | ICD-10-CM | POA: Diagnosis not present

## 2021-08-30 DIAGNOSIS — Z87891 Personal history of nicotine dependence: Secondary | ICD-10-CM | POA: Diagnosis not present

## 2021-08-30 DIAGNOSIS — Z90710 Acquired absence of both cervix and uterus: Secondary | ICD-10-CM | POA: Diagnosis not present

## 2021-08-30 DIAGNOSIS — Z8616 Personal history of COVID-19: Secondary | ICD-10-CM | POA: Diagnosis not present

## 2021-08-30 DIAGNOSIS — J449 Chronic obstructive pulmonary disease, unspecified: Secondary | ICD-10-CM | POA: Diagnosis not present

## 2021-08-30 DIAGNOSIS — I509 Heart failure, unspecified: Secondary | ICD-10-CM | POA: Diagnosis not present

## 2021-08-30 DIAGNOSIS — I4891 Unspecified atrial fibrillation: Secondary | ICD-10-CM | POA: Diagnosis not present

## 2021-08-30 DIAGNOSIS — J939 Pneumothorax, unspecified: Secondary | ICD-10-CM | POA: Diagnosis not present

## 2021-08-30 DIAGNOSIS — J9601 Acute respiratory failure with hypoxia: Secondary | ICD-10-CM | POA: Diagnosis not present

## 2021-08-30 DIAGNOSIS — I341 Nonrheumatic mitral (valve) prolapse: Secondary | ICD-10-CM | POA: Diagnosis not present

## 2022-09-03 IMAGING — DX DG CHEST 1V PORT
1 series · 1 of 1 positions shown · non-contrast
Comparison: 06/24/2021

CLINICAL DATA: Shortness of breath and hemoptysis

EXAM:
PORTABLE CHEST 1 VIEW

[chest ap]
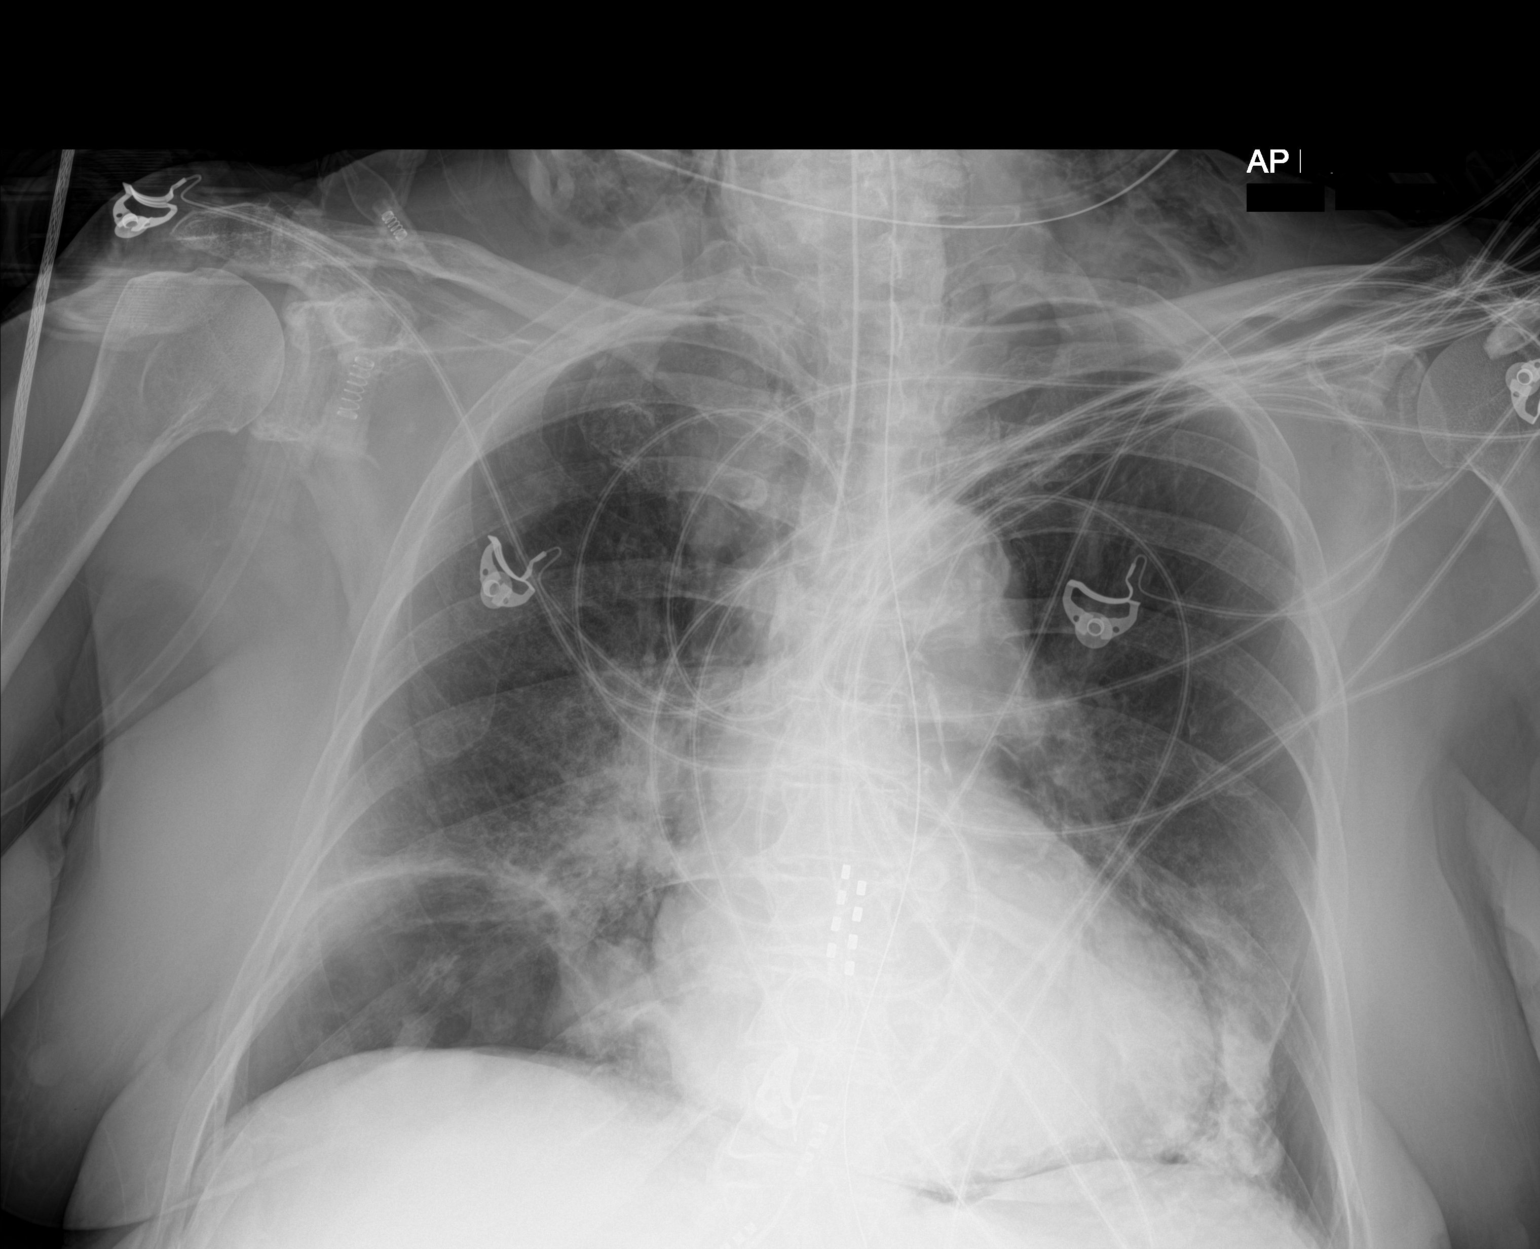

[1 of 1 positions shown; findings below may reference images not displayed]

FINDINGS: Support Apparatus:

--Endotracheal tube: Tip 2.5 cm above the inferior margin of the
carina. Retraction by 3 cm would place it at the level of the
clavicular heads.

--Enteric tube:Tip and sideport project over the stomach.

--Vascular catheter(s):None

--Other: None

Right parahilar opacity and left lateral basilar dense opacity, both
worsened since the earlier radiograph. Right basilar atelectasis.
Subcutaneous emphysema in the neck.
IMPRESSION: 1. Endotracheal tube tip 2.5 cm above the inferior margin of the
carina. Retraction by 3 cm would place the tip at the level of the
clavicular heads.
2. Worsening left lateral basilar and right parahilar opacities.
3. Subcutaneous emphysema in the neck. CTA of the chest is pending
at the time of dictation.

## 2022-09-04 IMAGING — CT CT ANGIO CHEST
2 of 6 series · 16 of 36 positions shown · IV contrast (omnipaque)
Comparison: Chest radiograph 06/24/2021, 06/25/2021, CT T-spine
11/20/2007

CLINICAL DATA: Patient woke feeling as though she needed to cough,
hematemesis, no history of respiratory problems

EXAM:
CT ANGIOGRAPHY CHEST WITH CONTRAST
TECHNIQUE: Multidetector CT imaging of the chest was performed using the
standard protocol during bolus administration of intravenous
contrast. Multiplanar CT image reconstructions and MIPs were
obtained to evaluate the vascular anatomy.
CONTRAST:  80mL OMNIPAQUE IOHEXOL 350 MG/ML SOLN

[Series 5: thins · axial · 0.77mm/px · z∈[-298,-27]mm · 15 of 305 slices shown]
[im 17/305  lung]
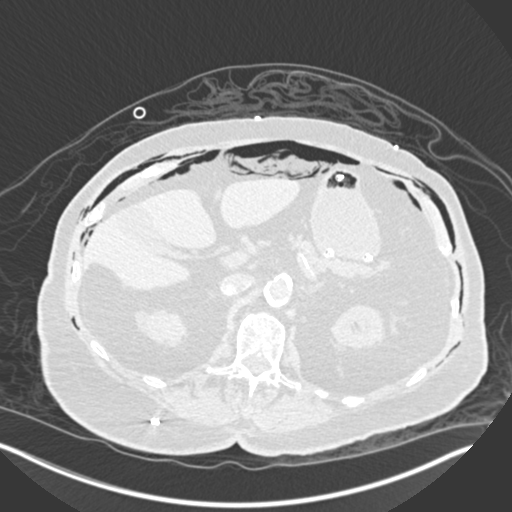
[im 34/305  mediastinal]
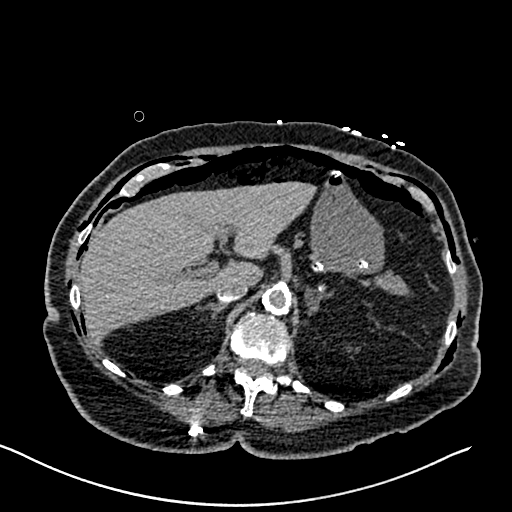
[im 51/305  lung]
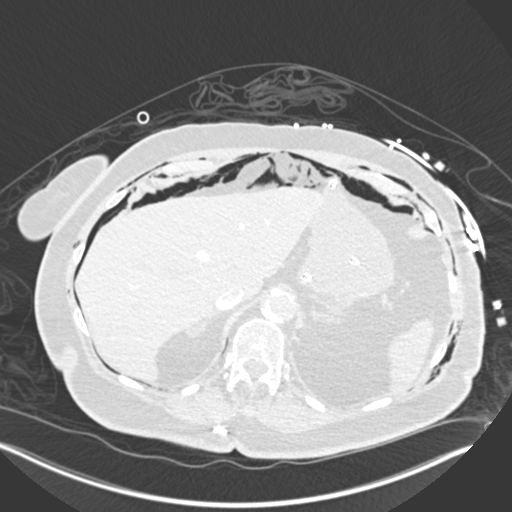
[im 68/305  mediastinal]
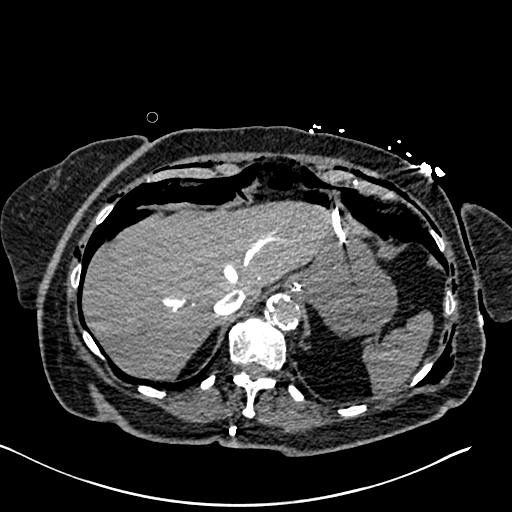
[im 102/305  lung]
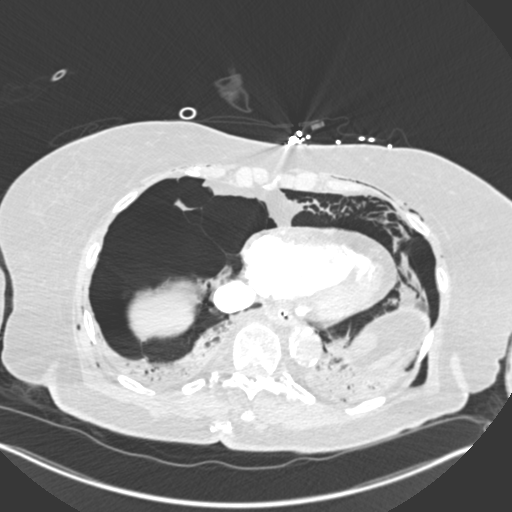
[im 119/305  mediastinal]
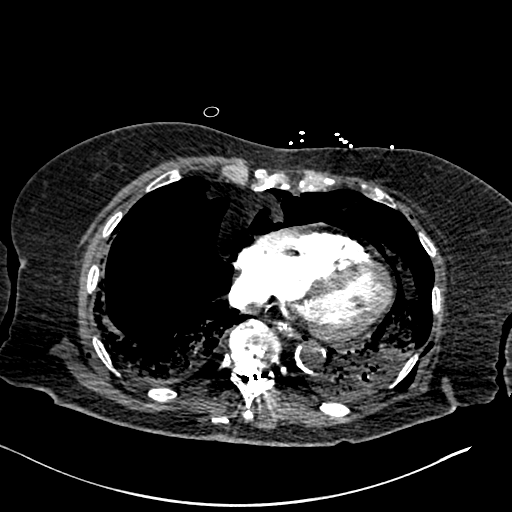
[im 136/305  lung]
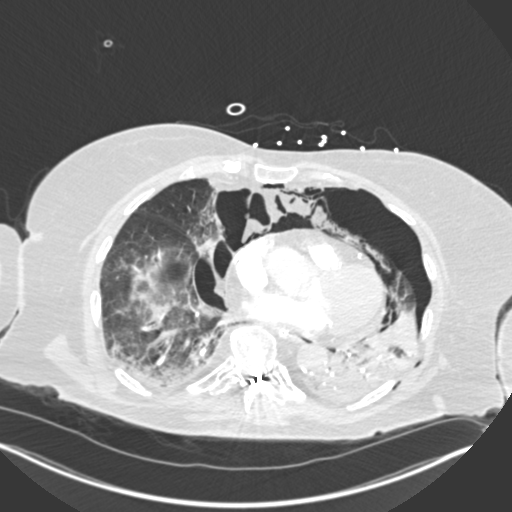
[im 153/305  mediastinal]
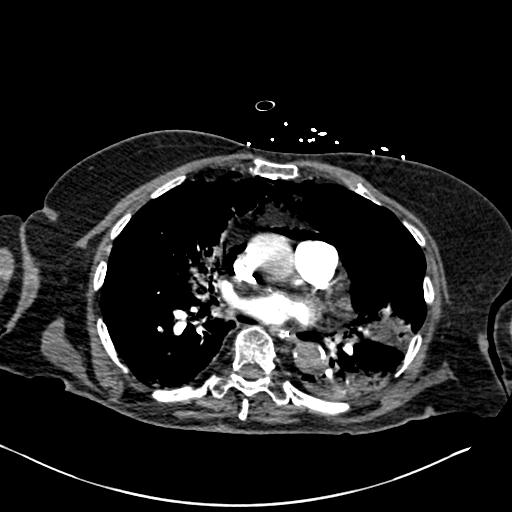
[im 169/305  lung]
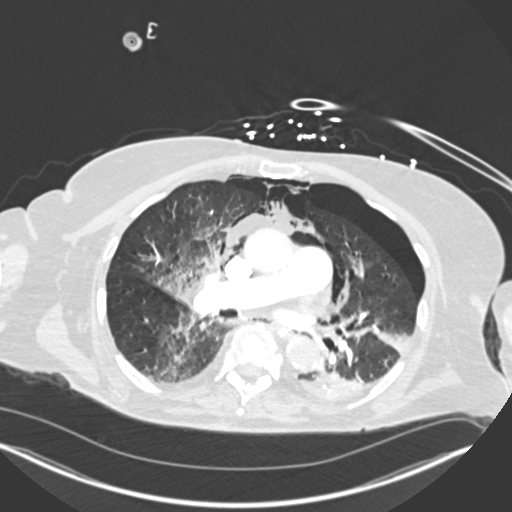
[im 186/305  mediastinal]
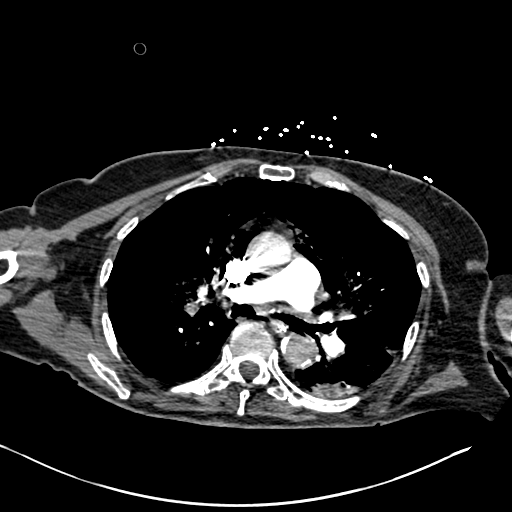
[im 203/305  lung]
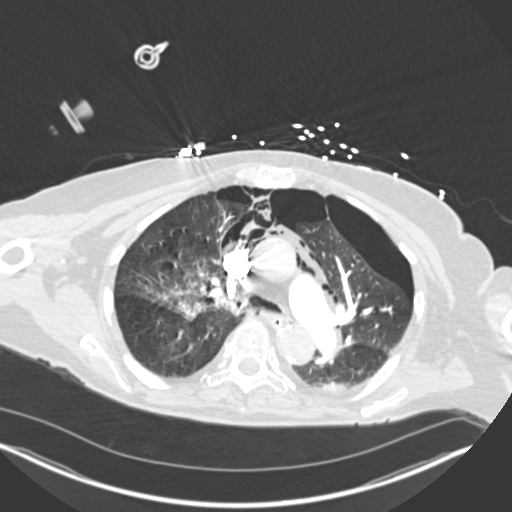
[im 237/305  mediastinal]
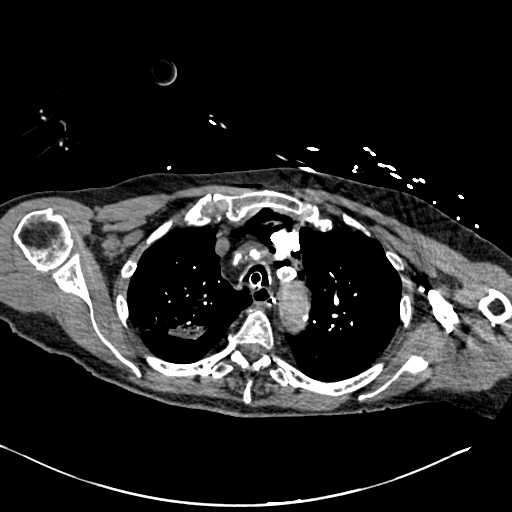
[im 254/305  lung]
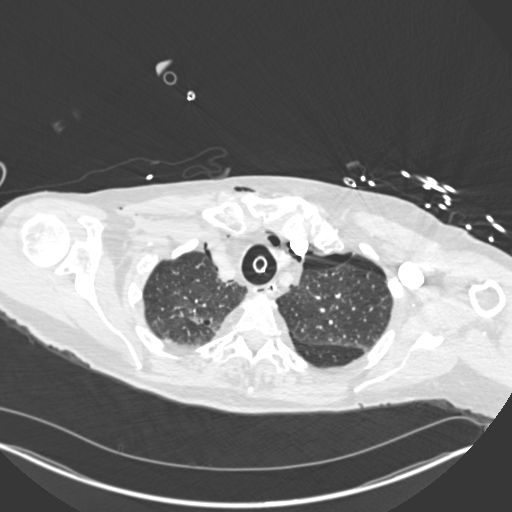
[im 271/305  mediastinal]
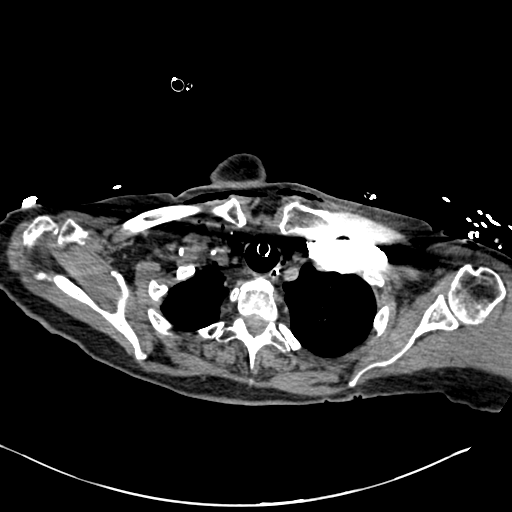
[im 288/305  lung]
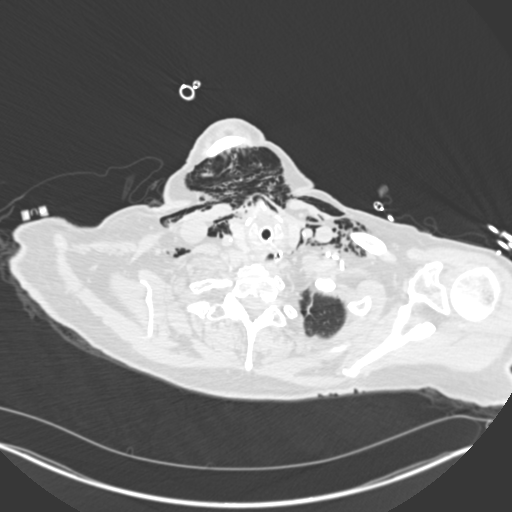

[Series 7: coronal mpr · coronal · 0.63mm/px · 1 of 136 slices shown]
[im 68/136  mediastinal]
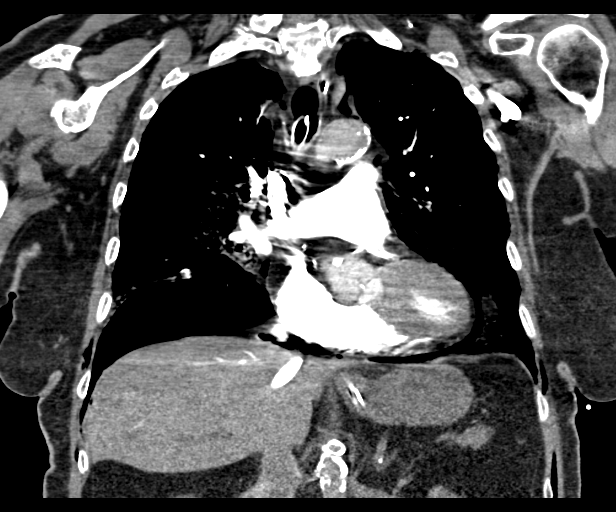

[16 of 36 positions shown; findings below may reference images not displayed]

FINDINGS: Cardiovascular: Satisfactory opacification the pulmonary arteries to
the segmental level. No pulmonary artery filling defects are
identified. Central pulmonary arteries are normal caliber. Cardiac
size within normal limits. No pericardial fluid. Question
pneumopericardium given the gas surrounding the cardiac apex of this
may be part of the more extensive pneumomediastinum. Three-vessel
coronary artery atherosclerosis is noted. Suboptimal opacification
of the thoracic aorta for luminal assessment. Broad-based
outpouching along the proximal descending thoracic aorta (6/40) just
distal to the left subclavian artery origin, could reflect sequela
of prior plaque ulceration or intramural hematoma without acute
stranding or inflammatory change at this time. Additional extensive
atheromatous plaque seen within the proximal great vessels,
incompletely assessed on this non tailored examination of the aorta.
There is venous reflux into the hepatic veins and IVC which can
indicate elevated right heart pressures.

Mediastinum/Nodes: Extensive pneumomediastinum without convincing
free mediastinal fluid. Patient is intubated at this time with the
endotracheal tube terminating low within the trachea 1.4 cm from the
carina. Secretions noted within the endotracheal tube and layering
in the distal trachea and central airways. Transesophageal tube in
place as well, coiled in the gastric lumen.

Lungs/Pleura: Moderate left pneumothorax. Suspect much of the air
along surrounding the anterior right lung is to be within the
subpleural space related to the pneumomediastinum. There are patchy
areas of dependent consolidative and ground-glass opacity as well as
more atelectatic regions though demonstrating some heterogeneous
enhancement of the airless lungs which could suggest underlying
consolidation/infection. No visible layering effusions.

Upper Abdomen: Extensive soft tissue gas is seen extending along the
anterior abdominal wall, much of which may be contained within the
fascial planes of the both superficial and deep to the rectus sheath
including foci of gas tracking along the umbilical vein. Small
amount of free intraperitoneal air is difficult to fully exclude.
Extensive atheromatous plaque seen within the distal thoracic aorta
including more lamellated appearance towards the diaphragmatic
hiatus. Transesophageal tube tip terminating in the stomach.

Musculoskeletal: No acute osseous abnormality or suspicious osseous
lesion. Extensive soft cutaneous gas extending across the chest wall
throughout the soft tissues of the neck as well as extending along
the anterior rectus sheath and fascial planes of the anterior
abdominal wall.

Review of the MIP images confirms the above findings.
IMPRESSION: No evidence of pulmonary artery embolism to the segmental level.

Extensive pneumomediastinum throughout the chest extending into the
base of the neck and into the anterior fascial planes of the
abdominal wall. Small amount of intraperitoneal free air is
difficult to fully exclude. Question a degree of pneumopericardium
as well given that gas extends contiguously across the cardiac apex
as well.

A moderate left pneumothorax is present. Some gas is seen
surrounding the anterior portion of the right lung as well though
question whether not this is subpleural versus intrapleural in
nature given the extent of mediastinal gas.

Patient is intubated at the time of exam low positioning of the
endotracheal tube 1.4 cm from the carina. Could consider retraction
to the mid trachea for optimal positioning.

Scattered secretions are seen within the endotracheal tube and in
the central airways, dependent regions of more consolidative and
ground-glass opacity are present in the lungs, some which is likely
atelectatic though heterogeneous enhancement of the airless lung is
suspicious for underlying infectious consolidation as well.

Reflux of contrast in the hepatic veins and IVC, nonspecific though
can be seen with elevated right heart pressures.

Extensive atherosclerotic calcification of the thoracic aorta and
proximal great vessels. Lamellated calcification of the distal
thoracic aorta towards the diaphragmatic hiatus as well as a
broad-based outpouching of the proximal descending thoracic aorta,
could reflect sequela of prior intramural hematoma or dissection
versus irregular plaque ulcerations, incompletely assessed on this
exam but without supportive features to suggest acuity at this time.
Could be further interrogated with dedicated CT angiography of the
aorta when patient is stabilized and better able to tolerate the
procedure. Aortic Atherosclerosis ([id number]).

These results were called by telephone at the time of interpretation
on 06/25/2021 at [DATE] to provider Dr Don Lolito , who verbally
acknowledged these results.
